# Patient Record
Sex: Female | Born: 1955 | Race: White | Hispanic: No | Smoking: Never smoker
Health system: Southern US, Community
[De-identification: ages and names within clinical notes are randomized; demographics above are authoritative.]

## PROBLEM LIST (undated history)

## (undated) DIAGNOSIS — R42 Dizziness and giddiness: Secondary | ICD-10-CM

## (undated) DIAGNOSIS — R001 Bradycardia, unspecified: Secondary | ICD-10-CM

## (undated) DIAGNOSIS — F329 Major depressive disorder, single episode, unspecified: Secondary | ICD-10-CM

## (undated) DIAGNOSIS — K829 Disease of gallbladder, unspecified: Secondary | ICD-10-CM

## (undated) DIAGNOSIS — R600 Localized edema: Secondary | ICD-10-CM

## (undated) DIAGNOSIS — F419 Anxiety disorder, unspecified: Secondary | ICD-10-CM

## (undated) DIAGNOSIS — R55 Syncope and collapse: Secondary | ICD-10-CM

## (undated) DIAGNOSIS — M549 Dorsalgia, unspecified: Secondary | ICD-10-CM

## (undated) DIAGNOSIS — R519 Headache, unspecified: Secondary | ICD-10-CM

## (undated) DIAGNOSIS — F32A Depression, unspecified: Secondary | ICD-10-CM

## (undated) DIAGNOSIS — G43019 Migraine without aura, intractable, without status migrainosus: Secondary | ICD-10-CM

## (undated) DIAGNOSIS — Z780 Asymptomatic menopausal state: Secondary | ICD-10-CM

## (undated) DIAGNOSIS — R51 Headache: Secondary | ICD-10-CM

## (undated) HISTORY — PX: BUNIONECTOMY: SHX129

## (undated) HISTORY — DX: Anxiety disorder, unspecified: F41.9

## (undated) HISTORY — DX: Major depressive disorder, single episode, unspecified: F32.9

## (undated) HISTORY — DX: Migraine without aura, intractable, without status migrainosus: G43.019

## (undated) HISTORY — DX: Dizziness and giddiness: R42

## (undated) HISTORY — PX: BACK SURGERY: SHX140

## (undated) HISTORY — DX: Headache, unspecified: R51.9

## (undated) HISTORY — DX: Localized edema: R60.0

## (undated) HISTORY — DX: Dorsalgia, unspecified: M54.9

## (undated) HISTORY — PX: BREAST SURGERY: SHX581

## (undated) HISTORY — DX: Depression, unspecified: F32.A

## (undated) HISTORY — DX: Disease of gallbladder, unspecified: K82.9

## (undated) HISTORY — DX: Asymptomatic menopausal state: Z78.0

## (undated) HISTORY — DX: Headache: R51

## (undated) HISTORY — DX: Bradycardia, unspecified: R00.1

## (undated) HISTORY — DX: Syncope and collapse: R55

## (undated) HISTORY — PX: KNEE ARTHROSCOPY: SUR90

## (undated) HISTORY — PX: CHOLECYSTECTOMY: SHX55

---

## 2005-09-10 ENCOUNTER — Encounter: Admission: RE | Admit: 2005-09-10 | Discharge: 2005-09-10 | Payer: Self-pay | Admitting: Internal Medicine

## 2005-10-22 ENCOUNTER — Other Ambulatory Visit: Admission: RE | Admit: 2005-10-22 | Discharge: 2005-10-22 | Payer: Self-pay | Admitting: Obstetrics & Gynecology

## 2013-07-21 ENCOUNTER — Ambulatory Visit (INDEPENDENT_AMBULATORY_CARE_PROVIDER_SITE_OTHER): Payer: BC Managed Care – PPO | Admitting: Psychology

## 2013-07-21 DIAGNOSIS — F331 Major depressive disorder, recurrent, moderate: Secondary | ICD-10-CM

## 2013-08-04 ENCOUNTER — Ambulatory Visit (INDEPENDENT_AMBULATORY_CARE_PROVIDER_SITE_OTHER): Payer: BC Managed Care – PPO | Admitting: Psychology

## 2013-08-04 DIAGNOSIS — F331 Major depressive disorder, recurrent, moderate: Secondary | ICD-10-CM

## 2013-08-10 ENCOUNTER — Encounter: Payer: Self-pay | Admitting: Internal Medicine

## 2013-08-10 ENCOUNTER — Ambulatory Visit (INDEPENDENT_AMBULATORY_CARE_PROVIDER_SITE_OTHER): Payer: BC Managed Care – PPO | Admitting: Internal Medicine

## 2013-08-10 ENCOUNTER — Other Ambulatory Visit: Payer: Self-pay | Admitting: Internal Medicine

## 2013-08-10 ENCOUNTER — Ambulatory Visit: Payer: BC Managed Care – PPO | Admitting: Psychology

## 2013-08-10 VITALS — BP 96/57 | HR 55 | Temp 98.0°F | Resp 18 | Ht 65.5 in | Wt 177.0 lb

## 2013-08-10 DIAGNOSIS — N951 Menopausal and female climacteric states: Secondary | ICD-10-CM

## 2013-08-10 DIAGNOSIS — K8020 Calculus of gallbladder without cholecystitis without obstruction: Secondary | ICD-10-CM

## 2013-08-10 DIAGNOSIS — K219 Gastro-esophageal reflux disease without esophagitis: Secondary | ICD-10-CM | POA: Insufficient documentation

## 2013-08-10 DIAGNOSIS — F3289 Other specified depressive episodes: Secondary | ICD-10-CM

## 2013-08-10 DIAGNOSIS — F419 Anxiety disorder, unspecified: Secondary | ICD-10-CM | POA: Insufficient documentation

## 2013-08-10 DIAGNOSIS — Z780 Asymptomatic menopausal state: Secondary | ICD-10-CM | POA: Insufficient documentation

## 2013-08-10 DIAGNOSIS — F32A Depression, unspecified: Secondary | ICD-10-CM | POA: Insufficient documentation

## 2013-08-10 DIAGNOSIS — K279 Peptic ulcer, site unspecified, unspecified as acute or chronic, without hemorrhage or perforation: Secondary | ICD-10-CM | POA: Insufficient documentation

## 2013-08-10 DIAGNOSIS — F329 Major depressive disorder, single episode, unspecified: Secondary | ICD-10-CM

## 2013-08-10 DIAGNOSIS — F411 Generalized anxiety disorder: Secondary | ICD-10-CM

## 2013-08-10 DIAGNOSIS — K859 Acute pancreatitis without necrosis or infection, unspecified: Secondary | ICD-10-CM

## 2013-08-10 LAB — CBC WITH DIFFERENTIAL/PLATELET
Basophils Absolute: 0 10*3/uL (ref 0.0–0.1)
Basophils Relative: 0 % (ref 0–1)
Eosinophils Absolute: 0.1 10*3/uL (ref 0.0–0.7)
Eosinophils Relative: 1 % (ref 0–5)
HCT: 39.6 % (ref 36.0–46.0)
Hemoglobin: 13.1 g/dL (ref 12.0–15.0)
Lymphocytes Relative: 41 % (ref 12–46)
Lymphs Abs: 2.5 10*3/uL (ref 0.7–4.0)
MCH: 27.1 pg (ref 26.0–34.0)
MCHC: 33.1 g/dL (ref 30.0–36.0)
MCV: 82 fL (ref 78.0–100.0)
Monocytes Absolute: 0.5 10*3/uL (ref 0.1–1.0)
Monocytes Relative: 8 % (ref 3–12)
Neutro Abs: 3.1 10*3/uL (ref 1.7–7.7)
Neutrophils Relative %: 50 % (ref 43–77)
Platelets: 221 10*3/uL (ref 150–400)
RBC: 4.83 MIL/uL (ref 3.87–5.11)
RDW: 14.8 % (ref 11.5–15.5)
WBC: 6.2 10*3/uL (ref 4.0–10.5)

## 2013-08-10 LAB — LIPID PANEL
Cholesterol: 167 mg/dL (ref 0–200)
HDL: 53 mg/dL (ref >39)
LDL Cholesterol: 98 mg/dL (ref 0–99)
Total CHOL/HDL Ratio: 3.2 Ratio
Triglycerides: 79 mg/dL (ref <150)
VLDL: 16 mg/dL (ref 0–40)

## 2013-08-10 NOTE — Patient Instructions (Signed)
Schedule CPE  To lab today  Sign old records

## 2013-08-10 NOTE — Progress Notes (Signed)
Subjective:    Patient ID: Sharon Cole, female    DOB: 07-11-1955, 58 y.o.   MRN: [id number]  HPI  New pt here for first visit to establish primary care.  Sharon Cole has been in [place] for 2 months, formerly from [facility].     She is the daughter of Sharon Cole a pt. Of mine  PMH of recurrent biliary stones, anxiety, depression, gerd,  PUD (15 years ago) and menopause.  Recurrent biliary stones: Pt reports she had cholecystectoy in 1998 and did well for several years.   She was found to have a biliary stone about 2 years ago  (told it was a pigment stone) and tells me she has had 8 ERCP's in the past several years.  Not clear if a definitive stone was found each time  ( I do not have old recrods)   She has been evaluated by GI MD  Dr. Gayleen Orem [facility] and will be seeing a biliary surgeon Dr. Cloyd Stagers at [facility] very soon.   She does not report any pain now.   She tells me she was told she did have slight increase in her liver blood tests that were perfomed at [facility]  ( I do not have results)  Anxiety/Depression  Well controlled on Lexapro  She does have a skin lesion on back  That itches and has gotten bigger in size  Strong FH of CAD brother, mother grandparents    Pt reports she had a cardiac cath 6 years ago and was told it was "clean"  GERD remote PUD well controlled with omeprazole    No Known Allergies Past Medical History  Diagnosis Date  . Anxiety   . Depression   . Menopause    Past Surgical History  Procedure Laterality Date  . Breast surgery      breast reduction  . Cholecystectomy    . Knee arthroscopy     History   Social History  . Marital Status: Unknown    Spouse Name: N/A    Number of Children: N/A  . Years of Education: N/A   Occupational History  . Not on file.   Social History Main Topics  . Smoking status: Never Smoker   . Smokeless tobacco: Never Used  . Alcohol Use: 0.0 oz/week    0-2 Glasses of wine per week     Comment: daily  . Drug  Use: No  . Sexual Activity: Yes   Other Topics Concern  . Not on file   Social History Narrative  . No narrative on file   Family History  Problem Relation Age of Onset  . Heart disease Mother   . Heart disease Brother   . Heart disease Maternal Grandmother    There are no active problems to display for this patient.  No current outpatient prescriptions on file prior to visit.   No current facility-administered medications on file prior to visit.      Review of Systems See HPI    Objective:   Physical Exam Physical Exam  Nursing note and vitals reviewed.  Constitutional: She is oriented to person, place, and time. She appears well-developed and well-nourished.  HENT:  Head: Normocephalic and atraumatic.  Cardiovascular: Normal rate and regular rhythm. Exam reveals no gallop and no friction rub.  No murmur heard.  Pulmonary/Chest: Breath sounds normal. She has no wheezes. She has no rales.  Neurological: She is alert and oriented to person, place, and time.  Skin: Skin is  warm and dry.  Psychiatric: She has a normal mood and affect. Her behavior is normal.          Assessment & Plan:  Recurrent biliary pigment stones.   Will have pt sign for records.  [facility] eval pending. Pt pain free now. Check labs today  Elevated lfts'   Per pt report     Anxiety/Depression refill Lexapro  GERD/remote PUD  contineu omeprazole  Strong FH  CAD   Menopause    Schedule CPE  Labs today

## 2013-08-11 ENCOUNTER — Telehealth: Payer: Self-pay | Admitting: *Deleted

## 2013-08-11 LAB — COMPREHENSIVE METABOLIC PANEL
ALT: 100 U/L — ABNORMAL HIGH (ref 0–35)
AST: 29 U/L (ref 0–37)
Albumin: 4 g/dL (ref 3.5–5.2)
Alkaline Phosphatase: 183 U/L — ABNORMAL HIGH (ref 39–117)
BUN: 22 mg/dL (ref 6–23)
CO2: 28 mEq/L (ref 19–32)
Calcium: 9.3 mg/dL (ref 8.4–10.5)
Chloride: 103 mEq/L (ref 96–112)
Creat: 0.64 mg/dL (ref 0.50–1.10)
Glucose, Bld: 82 mg/dL (ref 70–99)
Potassium: 4.2 mEq/L (ref 3.5–5.3)
Sodium: 140 mEq/L (ref 135–145)
Total Bilirubin: 0.5 mg/dL (ref 0.2–1.2)
Total Protein: 6.2 g/dL (ref 6.0–8.3)

## 2013-08-11 LAB — VITAMIN D 25 HYDROXY (VIT D DEFICIENCY, FRACTURES): Vit D, 25-Hydroxy: 61 ng/mL (ref 30–89)

## 2013-08-11 LAB — TSH: TSH: 2.986 u[IU]/mL (ref 0.350–4.500)

## 2013-08-11 NOTE — Telephone Encounter (Signed)
CMP added to recent labs

## 2013-08-18 ENCOUNTER — Telehealth: Payer: Self-pay | Admitting: Internal Medicine

## 2013-08-18 ENCOUNTER — Ambulatory Visit: Payer: BC Managed Care – PPO | Admitting: Psychology

## 2013-08-19 ENCOUNTER — Ambulatory Visit (INDEPENDENT_AMBULATORY_CARE_PROVIDER_SITE_OTHER): Payer: BC Managed Care – PPO | Admitting: Psychology

## 2013-08-19 DIAGNOSIS — F331 Major depressive disorder, recurrent, moderate: Secondary | ICD-10-CM

## 2013-08-23 ENCOUNTER — Ambulatory Visit (INDEPENDENT_AMBULATORY_CARE_PROVIDER_SITE_OTHER): Payer: BC Managed Care – PPO | Admitting: Psychology

## 2013-08-23 DIAGNOSIS — F331 Major depressive disorder, recurrent, moderate: Secondary | ICD-10-CM

## 2013-08-30 ENCOUNTER — Ambulatory Visit: Payer: BC Managed Care – PPO | Admitting: Psychology

## 2013-09-06 ENCOUNTER — Ambulatory Visit: Payer: BC Managed Care – PPO | Admitting: Psychology

## 2013-09-06 ENCOUNTER — Encounter: Payer: Self-pay | Admitting: Internal Medicine

## 2013-09-10 ENCOUNTER — Ambulatory Visit: Payer: BC Managed Care – PPO | Admitting: Psychology

## 2013-09-13 ENCOUNTER — Ambulatory Visit (INDEPENDENT_AMBULATORY_CARE_PROVIDER_SITE_OTHER): Payer: BC Managed Care – PPO | Admitting: Psychology

## 2013-09-13 DIAGNOSIS — F331 Major depressive disorder, recurrent, moderate: Secondary | ICD-10-CM

## 2013-09-17 ENCOUNTER — Encounter: Payer: Self-pay | Admitting: *Deleted

## 2013-09-20 ENCOUNTER — Ambulatory Visit (INDEPENDENT_AMBULATORY_CARE_PROVIDER_SITE_OTHER): Payer: BC Managed Care – PPO | Admitting: Psychology

## 2013-09-20 DIAGNOSIS — F331 Major depressive disorder, recurrent, moderate: Secondary | ICD-10-CM

## 2013-09-27 ENCOUNTER — Encounter: Payer: Self-pay | Admitting: Internal Medicine

## 2013-09-27 DIAGNOSIS — K851 Biliary acute pancreatitis without necrosis or infection: Secondary | ICD-10-CM | POA: Insufficient documentation

## 2013-09-27 DIAGNOSIS — Z9049 Acquired absence of other specified parts of digestive tract: Secondary | ICD-10-CM | POA: Insufficient documentation

## 2013-09-27 DIAGNOSIS — K279 Peptic ulcer, site unspecified, unspecified as acute or chronic, without hemorrhage or perforation: Secondary | ICD-10-CM | POA: Insufficient documentation

## 2013-10-01 ENCOUNTER — Ambulatory Visit: Payer: BC Managed Care – PPO | Admitting: Psychology

## 2013-10-04 ENCOUNTER — Ambulatory Visit (INDEPENDENT_AMBULATORY_CARE_PROVIDER_SITE_OTHER): Payer: BC Managed Care – PPO | Admitting: Psychology

## 2013-10-04 DIAGNOSIS — F331 Major depressive disorder, recurrent, moderate: Secondary | ICD-10-CM

## 2013-10-11 ENCOUNTER — Telehealth: Payer: Self-pay | Admitting: *Deleted

## 2013-10-11 NOTE — Telephone Encounter (Signed)
Refill request pt states that this is 40 mg BID

## 2013-10-11 NOTE — Telephone Encounter (Signed)
Needs 40 mg bid Omeprazole called into [facility] [place]  She is out.  She said she requested it at last office visit

## 2013-10-12 MED ORDER — OMEPRAZOLE 40 MG PO CPDR: 20.0000 mg | DELAYED_RELEASE_CAPSULE | Freq: Two times a day (BID) | ORAL | Status: DC

## 2013-10-14 ENCOUNTER — Ambulatory Visit (INDEPENDENT_AMBULATORY_CARE_PROVIDER_SITE_OTHER): Payer: BC Managed Care – PPO | Admitting: Psychology

## 2013-10-14 DIAGNOSIS — F331 Major depressive disorder, recurrent, moderate: Secondary | ICD-10-CM

## 2013-10-21 ENCOUNTER — Ambulatory Visit (INDEPENDENT_AMBULATORY_CARE_PROVIDER_SITE_OTHER): Payer: BC Managed Care – PPO | Admitting: Psychology

## 2013-10-21 ENCOUNTER — Encounter: Payer: Self-pay | Admitting: *Deleted

## 2013-10-21 DIAGNOSIS — F331 Major depressive disorder, recurrent, moderate: Secondary | ICD-10-CM

## 2013-10-28 ENCOUNTER — Ambulatory Visit: Payer: BC Managed Care – PPO | Admitting: Psychology

## 2013-11-04 ENCOUNTER — Ambulatory Visit (INDEPENDENT_AMBULATORY_CARE_PROVIDER_SITE_OTHER): Payer: BC Managed Care – PPO | Admitting: Psychology

## 2013-11-04 DIAGNOSIS — F331 Major depressive disorder, recurrent, moderate: Secondary | ICD-10-CM

## 2013-11-10 ENCOUNTER — Ambulatory Visit (INDEPENDENT_AMBULATORY_CARE_PROVIDER_SITE_OTHER): Payer: BC Managed Care – PPO | Admitting: Psychology

## 2013-11-10 DIAGNOSIS — F331 Major depressive disorder, recurrent, moderate: Secondary | ICD-10-CM

## 2013-11-11 ENCOUNTER — Ambulatory Visit: Payer: BC Managed Care – PPO | Admitting: Psychology

## 2013-11-18 ENCOUNTER — Ambulatory Visit (INDEPENDENT_AMBULATORY_CARE_PROVIDER_SITE_OTHER): Payer: BC Managed Care – PPO | Admitting: Psychology

## 2013-11-18 DIAGNOSIS — F331 Major depressive disorder, recurrent, moderate: Secondary | ICD-10-CM

## 2013-11-24 ENCOUNTER — Telehealth: Payer: Self-pay | Admitting: *Deleted

## 2013-11-24 NOTE — Telephone Encounter (Signed)
Forwarding to Dr.

## 2013-11-24 NOTE — Telephone Encounter (Signed)
Sharon Cole would like a refill on her Lorazepam 0.5 mg BID as needed.  [facility] [place]

## 2013-11-24 NOTE — Telephone Encounter (Signed)
Pt called in requesting refill on 0.5 LORAZEPAM.Next upcoming appt w/ you is 8/19.

## 2013-11-25 ENCOUNTER — Ambulatory Visit (INDEPENDENT_AMBULATORY_CARE_PROVIDER_SITE_OTHER): Payer: BC Managed Care – PPO | Admitting: Psychology

## 2013-11-25 DIAGNOSIS — F331 Major depressive disorder, recurrent, moderate: Secondary | ICD-10-CM

## 2013-11-30 ENCOUNTER — Telehealth: Payer: Self-pay | Admitting: *Deleted

## 2013-11-30 ENCOUNTER — Other Ambulatory Visit: Payer: Self-pay | Admitting: *Deleted

## 2013-11-30 NOTE — Telephone Encounter (Signed)
Resent request to Dr.

## 2013-11-30 NOTE — Telephone Encounter (Signed)
Sharon Cole called back. She has not heard anything about her RX request for Lorazepam.  Can you check with Dr Coralyn Mark?

## 2013-11-30 NOTE — Telephone Encounter (Signed)
Pt called in request for refill

## 2013-12-01 MED ORDER — LORAZEPAM 0.5 MG PO TABS
0.5000 mg | ORAL_TABLET | Freq: Three times a day (TID) | ORAL | Status: DC
Start: ? — End: 2014-01-17

## 2013-12-01 NOTE — Telephone Encounter (Signed)
Verbal called in

## 2013-12-09 ENCOUNTER — Ambulatory Visit: Payer: BC Managed Care – PPO | Admitting: Psychology

## 2013-12-16 ENCOUNTER — Ambulatory Visit (INDEPENDENT_AMBULATORY_CARE_PROVIDER_SITE_OTHER): Payer: BC Managed Care – PPO | Admitting: Psychology

## 2013-12-16 DIAGNOSIS — F331 Major depressive disorder, recurrent, moderate: Secondary | ICD-10-CM

## 2013-12-22 ENCOUNTER — Encounter: Payer: Self-pay | Admitting: Internal Medicine

## 2013-12-22 ENCOUNTER — Ambulatory Visit (INDEPENDENT_AMBULATORY_CARE_PROVIDER_SITE_OTHER): Payer: BC Managed Care – PPO | Admitting: Internal Medicine

## 2013-12-22 VITALS — BP 93/62 | HR 51 | Temp 97.1°F | Resp 16 | Ht 65.0 in | Wt 173.0 lb

## 2013-12-22 DIAGNOSIS — Z124 Encounter for screening for malignant neoplasm of cervix: Secondary | ICD-10-CM

## 2013-12-22 DIAGNOSIS — R109 Unspecified abdominal pain: Secondary | ICD-10-CM

## 2013-12-22 DIAGNOSIS — Z0000 Encounter for general adult medical examination without abnormal findings: Secondary | ICD-10-CM

## 2013-12-22 DIAGNOSIS — Z1151 Encounter for screening for human papillomavirus (HPV): Secondary | ICD-10-CM

## 2013-12-22 LAB — HEMOCCULT GUIAC POC 1CARD (OFFICE)
Card #2 Fecal Occult Blod, POC: NEGATIVE
Fecal Occult Blood, POC: NEGATIVE

## 2013-12-22 LAB — CBC WITH DIFFERENTIAL/PLATELET
Basophils Absolute: 0 10*3/uL (ref 0.0–0.1)
Basophils Relative: 0 % (ref 0–1)
Eosinophils Absolute: 0.1 10*3/uL (ref 0.0–0.7)
Eosinophils Relative: 1 % (ref 0–5)
HCT: 41.3 % (ref 36.0–46.0)
Hemoglobin: 13.7 g/dL (ref 12.0–15.0)
Lymphocytes Relative: 39 % (ref 12–46)
Lymphs Abs: 2.1 10*3/uL (ref 0.7–4.0)
MCH: 27.4 pg (ref 26.0–34.0)
MCHC: 33.2 g/dL (ref 30.0–36.0)
MCV: 82.6 fL (ref 78.0–100.0)
Monocytes Absolute: 0.4 10*3/uL (ref 0.1–1.0)
Monocytes Relative: 7 % (ref 3–12)
Neutro Abs: 2.8 10*3/uL (ref 1.7–7.7)
Neutrophils Relative %: 53 % (ref 43–77)
Platelets: 169 10*3/uL (ref 150–400)
RBC: 5 MIL/uL (ref 3.87–5.11)
RDW: 14.7 % (ref 11.5–15.5)
WBC: 5.3 10*3/uL (ref 4.0–10.5)

## 2013-12-22 LAB — COMPREHENSIVE METABOLIC PANEL
ALT: 20 U/L (ref 0–35)
AST: 21 U/L (ref 0–37)
Albumin: 4.1 g/dL (ref 3.5–5.2)
Alkaline Phosphatase: 90 U/L (ref 39–117)
BUN: 17 mg/dL (ref 6–23)
CO2: 21 mEq/L (ref 19–32)
Calcium: 8.6 mg/dL (ref 8.4–10.5)
Chloride: 102 mEq/L (ref 96–112)
Creat: 0.6 mg/dL (ref 0.50–1.10)
Glucose, Bld: 87 mg/dL (ref 70–99)
Potassium: 4.2 mEq/L (ref 3.5–5.3)
Sodium: 139 mEq/L (ref 135–145)
Total Bilirubin: 0.6 mg/dL (ref 0.2–1.2)
Total Protein: 6.3 g/dL (ref 6.0–8.3)

## 2013-12-22 LAB — POCT URINALYSIS DIPSTICK
Bilirubin, UA: NEGATIVE
Blood, UA: NEGATIVE
Glucose, UA: NEGATIVE
Ketones, UA: NEGATIVE
Leukocytes, UA: NEGATIVE
Nitrite, UA: NEGATIVE
Protein, UA: NEGATIVE
Spec Grav, UA: 1.01
Urobilinogen, UA: 0.2
pH, UA: 7

## 2013-12-22 LAB — LIPASE: Lipase: 18 U/L (ref 0–75)

## 2013-12-22 MED ORDER — NYSTATIN-TRIAMCINOLONE 100000-0.1 UNIT/GM-% EX OINT: 1.0000 "application " | TOPICAL_OINTMENT | Freq: Two times a day (BID) | CUTANEOUS | Status: DC

## 2013-12-22 MED ORDER — PROMETHAZINE HCL 12.5 MG PO TABS: 12.5000 mg | ORAL_TABLET | Freq: Three times a day (TID) | ORAL | Status: DC | PRN

## 2013-12-22 MED ORDER — MECLIZINE HCL 25 MG PO TABS: ORAL_TABLET | ORAL | Status: DC

## 2013-12-22 MED ORDER — OMEPRAZOLE 40 MG PO CPDR: 40.0000 mg | DELAYED_RELEASE_CAPSULE | Freq: Two times a day (BID) | ORAL | Status: DC

## 2013-12-22 MED ORDER — ESCITALOPRAM OXALATE 20 MG PO TABS: ORAL_TABLET | ORAL | Status: DC

## 2013-12-22 NOTE — Patient Instructions (Signed)
Give pt number to Dr. Delman Cheadle / Dr. Renda Rolls for pt to make appt  See me in 2-3 weeks  30 min   Set up 3D mm at breast center

## 2013-12-22 NOTE — Progress Notes (Signed)
Subjective:    Patient ID: Sharon Cole, female    DOB: 04-19-1956, 58 y.o.   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  Colonoscopy done 2013  In [place].  No prolyps  per her report  She has fungal infection on toes and Great toe R foot had injury and she is losing nail.  No surrounding redness.   She also reports dizziness that has felt similar to vertigo that she has had in the past.  She reports past histoyr of nausea and dizziness when living in [place].  Treated with antivert. ,  Zofran   Could not tolerate zofran  She also reports dull generalized abd pain that began 2 days ago with nausea.  Some diarrhea no fever.   No dysuria, no vomiting no vaginal discharge  She has a new boyfriend and is now living with her boyfriend.  Recurrent biliary stones  She had excision at [facility] with roux-en-Y procedure she states she is doing quite well   No Known Allergies Past Medical History  Diagnosis Date  . Anxiety   . Depression   . Menopause    Past Surgical History  Procedure Laterality Date  . Breast surgery      breast reduction  . Cholecystectomy    . Knee arthroscopy     History   Social History  . Marital Status: Unknown    Spouse Name: N/A    Number of Children: N/A  . Years of Education: N/A   Occupational History  . Not on file.   Social History Main Topics  . Smoking status: Never Smoker   . Smokeless tobacco: Never Used  . Alcohol Use: 0.0 oz/week    0-2 Glasses of wine per week     Comment: daily  . Drug Use: No  . Sexual Activity: Yes   Other Topics Concern  . Not on file   Social History Narrative  . No narrative on file   Family History  Problem Relation Age of Onset  . Heart disease Mother   . Heart disease Brother   . Heart disease Maternal Grandmother    Patient Active Problem List   Diagnosis Date Noted  . Gallstone  pancreatitis 09/27/2013  . PUD (peptic ulcer disease)  duodenal  2011 09/27/2013  . S/P cholecystectomy 09/27/2013  . Anxiety 08/10/2013  .  Biliary calculi recurrent S/p Roux-en-Y [facility] 08/10/2013  . Depression 08/10/2013  . GERD (gastroesophageal reflux disease) 08/10/2013  . Pancreatitis 08/10/2013  . Menopause 08/10/2013  . Peptic ulcer disease  remote 08/10/2013   Current Outpatient Prescriptions on File Prior to Visit  Medication Sig Dispense Refill  . escitalopram (LEXAPRO) 20 MG tablet Take 20 mg by mouth daily.      Marland Kitchen levofloxacin (LEVAQUIN) 500 MG tablet Take 500 mg by mouth daily.      Marland Kitchen LORazepam (ATIVAN) 0.5 MG tablet Take 1 tablet (0.5 mg total) by mouth every 8 (eight) hours.  30 tablet  2  . omeprazole (PRILOSEC) 40 MG capsule Take 1 capsule (40 mg total) by mouth 2 (two) times daily before a meal.  180 capsule  0   No current facility-administered medications on file prior to visit.      Review of Systems  Respiratory: Negative for cough, chest tightness, shortness of breath and wheezing.   Cardiovascular:  Negative for chest pain, palpitations and leg swelling.  Gastrointestinal: Negative for abdominal pain.       Objective:   Physical Exam  Physical Exam  Vital signs and nursing note reviewed  Constitutional: She is oriented to person, place, and time. She appears well-developed and well-nourished. She is cooperative.  HENT:  Head: Normocephalic and atraumatic.  Right Ear: Tympanic membrane normal.  Left Ear: Tympanic membrane normal.  Nose: Nose normal.  Mouth/Throat: Oropharynx is clear and moist and mucous membranes are normal. No oropharyngeal exudate or posterior oropharyngeal erythema.  Eyes: Conjunctivae and EOM are normal. Pupils are equal, round, and reactive to light.  Neck: Neck supple. No JVD present. Carotid bruit is not present. No mass and no thyromegaly present.  Cardiovascular: Regular rhythm, normal heart sounds, intact distal pulses  and normal pulses.  Exam reveals no gallop and no friction rub.   No murmur heard. Pulses:      Dorsalis pedis pulses are 2+ on the right side, and 2+ on the left side.  Pulmonary/Chest: Breath sounds normal. She has no wheezes. She has no rhonchi. She has no rales. Right breast exhibits no mass, no nipple discharge and no skin change. Left breast exhibits no mass, no nipple discharge and no skin change.  Abdominal: Soft. Bowel sounds are normal. She exhibits no distension and no mass. There is no hepatosplenomegaly. There is no tenderness. There is no CVA tenderness.  Genitourinary: Rectum normal, vagina normal and uterus normal. Rectal exam shows no mass. Guaiac negative stool. No labial fusion. There is no lesion on the right labia. There is no lesion on the left labia. Cervix exhibits no motion tenderness. Right adnexum displays no mass, no tenderness and no fullness. Left adnexum displays no mass, no tenderness and no fullness. No erythema around the vagina.  Musculoskeletal:       No active synovitis to any joint.    Lymphadenopathy:       Right cervical: No superficial cervical adenopathy present.      Left cervical: No superficial cervical adenopathy present.       Right axillary: No pectoral and no lateral adenopathy present.       Left axillary: No pectoral and no lateral adenopathy present.      Right: No inguinal adenopathy present.       Left: No inguinal adenopathy present.  Neurological:   Non-focal  She is alert and oriented to person, place, and time. She has normal strength and normal reflexes. No cranial nerve deficit or sensory deficit. She displays a negative Romberg sign. Coordination and gait normal.  Skin: Skin is warm and dry. No abrasion, no bruising, no ecchymosis and no rash noted. No cyanosis. Nails show no clubbing.  She has polish on toes but does have some thickening.  Left Great toe is loosining  No surrounding cellulitis She has flat brown spot onher back   Not  clear o fonset She does have scaly red rash moccasin distribution  Psychiatric: She has a normal mood and affect. Her speech is normal and behavior is normal.          Assessment & Plan:   HM:    Pap today will schedule 3D mm  See scanned sheet.   Counseled screening lung cancer guideline  Pt does not meet criteria  Non-smoker  Dizziness: See EKG  She has sinus bradycardia    May represent vertigo and will try antivert 25 mg if not better will need further eval.  Dull abd pain  With nausea  Will get labs today see me in 2-3 weeks if persistant will consider imaging.  She had some diarrhea and this may be low level GE but will follow expectantly   Recurrent biliary stones  Will get labs today  Depression  Continue Lexapro  Severe GERD  She is on high dose prilosec bid and that is the only thing that controls her.    Ok to continue   Elevated alk phos  See above.    Tinea foot/onychomycosis  mycolog bid   Will give number to Derm for pt to call  See me in 3-4 weeks        Assessment & Plan:

## 2013-12-23 ENCOUNTER — Other Ambulatory Visit: Payer: Self-pay | Admitting: Internal Medicine

## 2013-12-23 ENCOUNTER — Ambulatory Visit: Payer: BC Managed Care – PPO | Admitting: Psychology

## 2013-12-23 DIAGNOSIS — Z1231 Encounter for screening mammogram for malignant neoplasm of breast: Secondary | ICD-10-CM

## 2013-12-23 LAB — CYTOLOGY - PAP

## 2013-12-23 LAB — VITAMIN D 25 HYDROXY (VIT D DEFICIENCY, FRACTURES): Vit D, 25-Hydroxy: 57 ng/mL (ref 30–89)

## 2013-12-27 ENCOUNTER — Telehealth: Payer: Self-pay | Admitting: *Deleted

## 2013-12-27 ENCOUNTER — Encounter: Payer: Self-pay | Admitting: *Deleted

## 2013-12-27 NOTE — Telephone Encounter (Signed)
Sharon Cole said that she needs to see a cardiologist, and she would like to see Dr Gwenlyn Found. He is not taking new patients until October; she wants to know if we are able to get her in sooner.

## 2013-12-29 NOTE — Telephone Encounter (Signed)
Call pt and check on her dizziness.   Is it worse?    Why does she need a cardiologist?  If she is having symptoms or dizziness worse she needs to see me in am  Route back response to me

## 2013-12-29 NOTE — Telephone Encounter (Signed)
I spoke to pt about the Cardio referral. Pt states Dr. Coralyn Mark wanted her to be seen by cardiologist due to bradycardia. I explained to pt that I didn't see that referral in the chart but would get with Dr Coralyn Mark to follow up on that. Pt states dizziness has "almost resolved" but had fainted a couple of days ago after taking Meclizine and Phenergan together. Pt states she thinks the fainting spell is related to the medication she took and not so much related to bradycardia. However, pt would still like to be referred to Dr. Gwenlyn Found.

## 2013-12-30 ENCOUNTER — Ambulatory Visit (INDEPENDENT_AMBULATORY_CARE_PROVIDER_SITE_OTHER): Payer: BC Managed Care – PPO | Admitting: Psychology

## 2013-12-30 ENCOUNTER — Telehealth: Payer: Self-pay | Admitting: Internal Medicine

## 2013-12-30 ENCOUNTER — Telehealth: Payer: Self-pay | Admitting: Cardiovascular Disease

## 2013-12-30 DIAGNOSIS — F331 Major depressive disorder, recurrent, moderate: Secondary | ICD-10-CM

## 2013-12-30 NOTE — Telephone Encounter (Signed)
Left message on voicemail to call office as I wish to speak to pt regarding her fainting.

## 2013-12-30 NOTE — Telephone Encounter (Signed)
Dr Coralyn Mark  Called She would like this patient to be seen as a new patient for Dr Roderic Palau BERRY SX bradycardia rate 10 Family history of cardiac issues One episode of syncope  Per Dr Coralyn Mark ,not urgent. RN spoke to [employer] (Dr Kennon Holter nurse). Appointment 01/14/2014 at 7:45 am  Will have schedule contact patient -give date and send new patient packet

## 2014-01-04 ENCOUNTER — Telehealth: Payer: Self-pay | Admitting: Cardiovascular Disease

## 2014-01-06 ENCOUNTER — Ambulatory Visit (INDEPENDENT_AMBULATORY_CARE_PROVIDER_SITE_OTHER): Payer: BC Managed Care – PPO | Admitting: Psychology

## 2014-01-06 DIAGNOSIS — F331 Major depressive disorder, recurrent, moderate: Secondary | ICD-10-CM

## 2014-01-06 NOTE — Telephone Encounter (Signed)
Closed encounter °

## 2014-01-12 ENCOUNTER — Ambulatory Visit: Payer: BC Managed Care – PPO | Admitting: Internal Medicine

## 2014-01-13 ENCOUNTER — Ambulatory Visit (INDEPENDENT_AMBULATORY_CARE_PROVIDER_SITE_OTHER): Payer: BC Managed Care – PPO | Admitting: Psychology

## 2014-01-13 DIAGNOSIS — F331 Major depressive disorder, recurrent, moderate: Secondary | ICD-10-CM

## 2014-01-14 ENCOUNTER — Ambulatory Visit (INDEPENDENT_AMBULATORY_CARE_PROVIDER_SITE_OTHER): Payer: BC Managed Care – PPO | Admitting: Cardiovascular Disease

## 2014-01-14 ENCOUNTER — Encounter: Payer: Self-pay | Admitting: Cardiovascular Disease

## 2014-01-14 VITALS — BP 92/70 | HR 52 | Ht 65.5 in | Wt 176.4 lb

## 2014-01-14 DIAGNOSIS — R55 Syncope and collapse: Secondary | ICD-10-CM

## 2014-01-14 DIAGNOSIS — R001 Bradycardia, unspecified: Secondary | ICD-10-CM

## 2014-01-14 DIAGNOSIS — I498 Other specified cardiac arrhythmias: Secondary | ICD-10-CM

## 2014-01-14 NOTE — Assessment & Plan Note (Signed)
Patient has asymptomatic bradycardia with heart rates in the 40-50 range. She is on no rate lowering medications. She is also relatively hypotensive with blood pressures in the 90/60 range. She has had episodes of vertigo recently with a episode of syncope of unclear etiology. I'm going to get a 30 day event monitor to further evaluate.

## 2014-01-14 NOTE — Progress Notes (Signed)
01/14/2014 BRITTON PERKINSON   Mar 19, 1956  [id number]  Primary Physician Kelton Pillar, MD Primary Cardiologist: Lorretta Harp MD Renae Gloss   HPI:  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 She was referred to me by Dr. Coralyn Mark for evaluation of bradycardia and syncope. Her cardiac risk factor for profile is notable for family history with a mother who had CAD and a brother who had a past surgery age 61. She does not smoke nor she diabetic, hypertensive or hyperlipidemic. She's never had a heart attack or stroke and denies chest pain or shortness of breath. She had a episode of syncope 2 weeks ago in the evening prior to going to bed. She also apparently was not taking her Ativan for several days and was experiencing vertigo. There was no loss of bowel or bladder control.   Current Outpatient Prescriptions  Medication Sig Dispense Refill  . escitalopram (LEXAPRO) 20 MG tablet Take one tablet bid  180 tablet  1  . LORazepam (ATIVAN) 0.5 MG tablet Take 1 tablet (0.5 mg total) by mouth every 8 (eight) hours.  30 tablet  2  . meclizine (ANTIVERT) 25 MG tablet Take one tablet bid prn dizziness  30 tablet  0  . nystatin-triamcinolone ointment (MYCOLOG) Apply 1 application topically 2 (two) times daily.  30 g  0  . omeprazole (PRILOSEC) 40 MG capsule Take 1 capsule (40 mg total) by mouth 2 (two) times daily before a meal.  180 capsule  0  . promethazine (PHENERGAN) 12.5 MG tablet Take 1 tablet (12.5 mg total) by mouth every 8 (eight) hours as needed for nausea or vomiting.  20 tablet  0   No current facility-administered medications for this visit.    Allergies  Allergen Reactions  . Zofran [Ondansetron Hcl]     intolerance    History   Social History  . Marital Status: Unknown    Spouse Name: N/A    Number of Children: N/A  . Years of  Education: N/A   Occupational History  . Not on file.   Social History Main Topics  . Smoking status: Never Smoker   . Smokeless tobacco: Never Used  . Alcohol Use: 0.0 oz/week    0-2 Glasses of wine per week     Comment: daily  . Drug Use: No  . Sexual Activity: Yes   Other Topics Concern  . Not on file   Social History Narrative  . No narrative on file     Review of Systems: General: negative for chills, fever, night sweats or weight changes.  Cardiovascular: negative for chest pain, dyspnea on exertion, edema, orthopnea, palpitations, paroxysmal nocturnal dyspnea or shortness of breath Dermatological: negative for rash Respiratory: negative for cough or wheezing Urologic: negative for hematuria Abdominal: negative for nausea, vomiting, diarrhea, bright red blood per rectum, melena, or hematemesis Neurologic: negative for visual changes, syncope, or dizziness All other systems reviewed and are otherwise negative except as noted above.    Blood pressure 92/70, pulse 52, height 5' 5.5" (1.664 m), weight 176 lb 6.4 oz (80.015 kg).  General appearance: alert and no distress Neck: no adenopathy, no carotid bruit, no JVD, supple, symmetrical, trachea midline and thyroid not enlarged, symmetric, no tenderness/mass/nodules Lungs: clear to auscultation bilaterally Heart: regular rate and rhythm, S1, S2 normal, no murmur, click, rub or gallop Extremities: extremities normal, atraumatic, no cyanosis or edema and  1+ pedal pulses bilaterally  EKG not performed today. Recent 2-D echo performed 12/23/11 revealed sinus bradycardia at 47 with low voltage.  ASSESSMENT AND PLAN:   Bradycardia Patient has asymptomatic bradycardia with heart rates in the 40-50 range. She is on no rate lowering medications. She is also relatively hypotensive with blood pressures in the 90/60 range. She has had episodes of vertigo recently with a episode of syncope of unclear etiology. I'm going to get a 30  day event monitor to further evaluate.  Syncope Episode occurred approximately 2-3 weeks ago. It was in the setting of vertigo. She did not lose bowel or bladder control. She has a history of a normal cardiac catheterization 5 years ago. I'm going to get a one-month event monitor.      Lorretta Harp MD FACP,FACC,FAHA, [facility] 01/14/2014 8:25 AM

## 2014-01-14 NOTE — Assessment & Plan Note (Signed)
Episode occurred approximately 2-3 weeks ago. It was in the setting of vertigo. She did not lose bowel or bladder control. She has a history of a normal cardiac catheterization 5 years ago. I'm going to get a one-month event monitor.

## 2014-01-14 NOTE — Patient Instructions (Signed)
  We will see you back in follow up only as needed.   Dr Gwenlyn Found has ordered:   Event monitor (30 days). Event monitors are medical devices that record the heart's electrical activity. Doctors most often [place] these monitors to diagnose arrhythmias. Arrhythmias are problems with the speed or rhythm of the heartbeat. The monitor is a small, portable device. You can wear one while you do your normal daily activities. This is usually used to diagnose what is causing palpitations/syncope (passing out).

## 2014-01-17 ENCOUNTER — Ambulatory Visit (INDEPENDENT_AMBULATORY_CARE_PROVIDER_SITE_OTHER): Payer: BC Managed Care – PPO | Admitting: Internal Medicine

## 2014-01-17 ENCOUNTER — Encounter: Payer: Self-pay | Admitting: Internal Medicine

## 2014-01-17 VITALS — BP 93/56 | HR 53 | Resp 16 | Ht 65.0 in | Wt 176.0 lb

## 2014-01-17 DIAGNOSIS — H9312 Tinnitus, left ear: Secondary | ICD-10-CM

## 2014-01-17 DIAGNOSIS — R42 Dizziness and giddiness: Secondary | ICD-10-CM | POA: Diagnosis not present

## 2014-01-17 DIAGNOSIS — G4700 Insomnia, unspecified: Secondary | ICD-10-CM | POA: Diagnosis not present

## 2014-01-17 DIAGNOSIS — H9319 Tinnitus, unspecified ear: Secondary | ICD-10-CM

## 2014-01-17 MED ORDER — LORAZEPAM 0.5 MG PO TABS: ORAL_TABLET | ORAL | Status: DC

## 2014-01-17 NOTE — Patient Instructions (Signed)
Will refer to Dr. Constance Holster    See me as needed

## 2014-01-17 NOTE — Progress Notes (Signed)
Subjective:    Patient ID: Sharon Cole, female    DOB: [birth year], 58 y.o.   MRN: [id number]  HPI Last OV Dizziness: See EKG She has sinus bradycardia May represent vertigo and will try antivert 25 mg if not better will need further eval.  Dull abd pain With nausea Will get labs today see me in 2-3 weeks if persistant will consider imaging. She had some diarrhea and this may be low level GE but will follow expectantly  Recurrent biliary stones Will get labs today  Depression Continue Lexapro  Severe GERD She is on high dose prilosec bid and that is the only thing that controls her. Ok to continue  Elevated alk phos See above.   Today: Sharon Cole is here for follow up   She has seen cardiologist who plans a 30 day event moniter   Dizziness has resolved .  Lower abd cramping resolved  No diarrhea no pain   She reports she has been on low dose Ativan for "years" at night to help her sleep.  She is thinking she would like to try to come off medication soon  Left ear feels full with "weird noises"  Almost like a buzzing  She would like to see an ENT   Allergies  Allergen Reactions  . Zofran [Ondansetron Hcl]     intolerance   Past Medical History  Diagnosis Date  . Anxiety   . Depression   . Menopause   . Bradycardia   . Syncope    Past Surgical History  Procedure Laterality Date  . Breast surgery      breast reduction  . Cholecystectomy    . Knee arthroscopy     History   Social History  . Marital Status: Unknown    Spouse Name: N/A    Number of Children: N/A  . Years of Education: N/A   Occupational History  . Not on file.   Social History Main Topics  . Smoking status: Never Smoker   . Smokeless tobacco: Never Used  . Alcohol Use: 0.0 oz/week    0-2 Glasses of wine per week     Comment: daily  . Drug Use: No  . Sexual Activity: Yes   Other Topics Concern  . Not on file   Social History Narrative  . No narrative on file   Family History  Problem  Relation Age of Onset  . Heart disease Mother   . Heart disease Brother   . Heart disease Maternal Grandmother    Patient Active Problem List   Diagnosis Date Noted  . Bradycardia 01/14/2014  . Syncope 01/14/2014  . Gallstone pancreatitis 09/27/2013  . PUD (peptic ulcer disease)  duodenal  2011 09/27/2013  . S/P cholecystectomy 09/27/2013  . Anxiety 08/10/2013  .  Biliary calculi recurrent S/p Roux-en-Y [facility] 08/10/2013  . Depression 08/10/2013  . GERD (gastroesophageal reflux disease) 08/10/2013  . Pancreatitis 08/10/2013  . Menopause 08/10/2013  . Peptic ulcer disease  remote 08/10/2013   Current Outpatient Prescriptions on File Prior to Visit  Medication Sig Dispense Refill  . escitalopram (LEXAPRO) 20 MG tablet Take one tablet bid  180 tablet  1  . LORazepam (ATIVAN) 0.5 MG tablet Take 1 tablet (0.5 mg total) by mouth every 8 (eight) hours.  30 tablet  2  . meclizine (ANTIVERT) 25 MG tablet Take one tablet bid prn dizziness  30 tablet  0  . nystatin-triamcinolone ointment (MYCOLOG) Apply 1 application topically 2 (two) times daily.  30 g  0  . omeprazole (PRILOSEC) 40 MG capsule Take 1 capsule (40 mg total) by mouth 2 (two) times daily before a meal.  180 capsule  0  . promethazine (PHENERGAN) 12.5 MG tablet Take 1 tablet (12.5 mg total) by mouth every 8 (eight) hours as needed for nausea or vomiting.  20 tablet  0   No current facility-administered medications on file prior to visit.        Review of Systems See HPI    Objective:   Physical Exam Physical Exam  Nursing note and vitals reviewed.  Repeat BP  94/60 Constitutional: She is oriented to person, place, and time. She appears well-developed and well-nourished.  HENT:  TM:  Left ear  Canal clear  Serous fluid behind drum  Right TM normal  Head: Normocephalic and atraumatic.  Cardiovascular: Normal rate and regular rhythm. Exam reveals no gallop and no friction rub.  No murmur heard.  Pulmonary/Chest: Breath  sounds normal. She has no wheezes. She has no rales.  Neurological: She is alert and oriented to person, place, and time.  Skin: Skin is warm and dry.  Psychiatric: She has a normal mood and affect. Her behavior is normal.              Assessment & Plan:  Dizziness/bradycardia  Work up pending  She has 30 day moniter on  Left ear tinnitus    Pt would like to see and ENT  Ok to refer   Chronic insomnia ativan use  I offered referral to psychiatrist or neurologist to try to taper of BZP  Advised do not stop cold [place].  She wishes to think about this   See me as needed

## 2014-01-18 ENCOUNTER — Other Ambulatory Visit: Payer: Self-pay | Admitting: Internal Medicine

## 2014-01-18 DIAGNOSIS — G4700 Insomnia, unspecified: Secondary | ICD-10-CM

## 2014-01-20 ENCOUNTER — Ambulatory Visit
Admission: RE | Admit: 2014-01-20 | Discharge: 2014-01-20 | Disposition: A | Discharge status: Home / Self Care | Payer: BC Managed Care – PPO | Source: Ambulatory Visit | Attending: Internal Medicine | Admitting: Internal Medicine

## 2014-01-20 DIAGNOSIS — Z1231 Encounter for screening mammogram for malignant neoplasm of breast: Secondary | ICD-10-CM

## 2014-01-21 ENCOUNTER — Ambulatory Visit (INDEPENDENT_AMBULATORY_CARE_PROVIDER_SITE_OTHER): Payer: BC Managed Care – PPO | Admitting: Psychology

## 2014-01-21 DIAGNOSIS — F331 Major depressive disorder, recurrent, moderate: Secondary | ICD-10-CM

## 2014-01-24 ENCOUNTER — Other Ambulatory Visit: Payer: Self-pay | Admitting: Internal Medicine

## 2014-01-24 NOTE — Telephone Encounter (Signed)
Refill request

## 2014-01-27 ENCOUNTER — Ambulatory Visit (INDEPENDENT_AMBULATORY_CARE_PROVIDER_SITE_OTHER): Payer: BC Managed Care – PPO | Admitting: Psychology

## 2014-01-27 DIAGNOSIS — F331 Major depressive disorder, recurrent, moderate: Secondary | ICD-10-CM

## 2014-02-03 ENCOUNTER — Ambulatory Visit: Payer: BC Managed Care – PPO | Admitting: Psychology

## 2014-02-11 ENCOUNTER — Ambulatory Visit (INDEPENDENT_AMBULATORY_CARE_PROVIDER_SITE_OTHER): Payer: BC Managed Care – PPO | Admitting: Psychology

## 2014-02-11 DIAGNOSIS — F332 Major depressive disorder, recurrent severe without psychotic features: Secondary | ICD-10-CM

## 2014-02-17 ENCOUNTER — Ambulatory Visit (INDEPENDENT_AMBULATORY_CARE_PROVIDER_SITE_OTHER): Payer: BC Managed Care – PPO | Admitting: Psychology

## 2014-02-17 DIAGNOSIS — F332 Major depressive disorder, recurrent severe without psychotic features: Secondary | ICD-10-CM

## 2014-02-24 ENCOUNTER — Ambulatory Visit: Payer: BC Managed Care – PPO | Admitting: Psychology

## 2014-02-25 ENCOUNTER — Ambulatory Visit (INDEPENDENT_AMBULATORY_CARE_PROVIDER_SITE_OTHER): Payer: BC Managed Care – PPO | Admitting: Psychology

## 2014-02-25 DIAGNOSIS — F332 Major depressive disorder, recurrent severe without psychotic features: Secondary | ICD-10-CM

## 2014-03-07 ENCOUNTER — Encounter: Payer: Self-pay | Admitting: Internal Medicine

## 2014-03-11 ENCOUNTER — Ambulatory Visit (INDEPENDENT_AMBULATORY_CARE_PROVIDER_SITE_OTHER): Payer: BC Managed Care – PPO | Admitting: Psychology

## 2014-03-11 DIAGNOSIS — F332 Major depressive disorder, recurrent severe without psychotic features: Secondary | ICD-10-CM

## 2014-03-17 ENCOUNTER — Ambulatory Visit (INDEPENDENT_AMBULATORY_CARE_PROVIDER_SITE_OTHER): Payer: BC Managed Care – PPO | Admitting: Psychology

## 2014-03-17 DIAGNOSIS — F332 Major depressive disorder, recurrent severe without psychotic features: Secondary | ICD-10-CM

## 2014-03-22 ENCOUNTER — Telehealth: Payer: Self-pay | Admitting: Internal Medicine

## 2014-03-22 NOTE — Telephone Encounter (Signed)
Sharon Cole [phone] [facility] needing a refill on her LORazepam (ATIVAN) 0.5 MG tablet

## 2014-03-22 NOTE — Telephone Encounter (Signed)
Refill request

## 2014-03-23 ENCOUNTER — Other Ambulatory Visit: Payer: Self-pay | Admitting: Internal Medicine

## 2014-03-23 NOTE — Telephone Encounter (Signed)
RX called in -eh 

## 2014-03-24 ENCOUNTER — Ambulatory Visit (INDEPENDENT_AMBULATORY_CARE_PROVIDER_SITE_OTHER): Payer: BC Managed Care – PPO | Admitting: Psychology

## 2014-03-24 DIAGNOSIS — F332 Major depressive disorder, recurrent severe without psychotic features: Secondary | ICD-10-CM

## 2014-04-08 ENCOUNTER — Ambulatory Visit: Payer: BC Managed Care – PPO | Admitting: Psychology

## 2014-04-14 ENCOUNTER — Ambulatory Visit (INDEPENDENT_AMBULATORY_CARE_PROVIDER_SITE_OTHER): Payer: BC Managed Care – PPO | Admitting: Psychology

## 2014-04-14 DIAGNOSIS — F332 Major depressive disorder, recurrent severe without psychotic features: Secondary | ICD-10-CM

## 2014-04-21 ENCOUNTER — Ambulatory Visit (INDEPENDENT_AMBULATORY_CARE_PROVIDER_SITE_OTHER): Payer: BC Managed Care – PPO | Admitting: Psychology

## 2014-04-21 DIAGNOSIS — F332 Major depressive disorder, recurrent severe without psychotic features: Secondary | ICD-10-CM

## 2014-05-05 ENCOUNTER — Ambulatory Visit (INDEPENDENT_AMBULATORY_CARE_PROVIDER_SITE_OTHER): Payer: BC Managed Care – PPO | Admitting: Psychology

## 2014-05-05 DIAGNOSIS — F332 Major depressive disorder, recurrent severe without psychotic features: Secondary | ICD-10-CM

## 2014-05-12 ENCOUNTER — Ambulatory Visit: Payer: BC Managed Care – PPO | Admitting: Psychology

## 2014-05-19 ENCOUNTER — Ambulatory Visit: Payer: Self-pay | Admitting: Psychology

## 2014-05-24 ENCOUNTER — Other Ambulatory Visit: Payer: Self-pay | Admitting: *Deleted

## 2014-05-24 DIAGNOSIS — Z760 Encounter for issue of repeat prescription: Secondary | ICD-10-CM

## 2014-05-24 MED ORDER — LORAZEPAM 0.5 MG PO TABS: ORAL_TABLET | ORAL | Status: DC

## 2014-05-24 NOTE — Telephone Encounter (Signed)
Needs refill on Lorazapam

## 2014-05-24 NOTE — Telephone Encounter (Signed)
R/X phoned in.

## 2014-05-26 ENCOUNTER — Ambulatory Visit (INDEPENDENT_AMBULATORY_CARE_PROVIDER_SITE_OTHER): Payer: BLUE CROSS/BLUE SHIELD | Admitting: Psychology

## 2014-05-26 DIAGNOSIS — F332 Major depressive disorder, recurrent severe without psychotic features: Secondary | ICD-10-CM

## 2014-06-02 ENCOUNTER — Ambulatory Visit (INDEPENDENT_AMBULATORY_CARE_PROVIDER_SITE_OTHER): Payer: BLUE CROSS/BLUE SHIELD | Admitting: Psychology

## 2014-06-02 DIAGNOSIS — F332 Major depressive disorder, recurrent severe without psychotic features: Secondary | ICD-10-CM

## 2014-06-09 ENCOUNTER — Ambulatory Visit: Payer: BLUE CROSS/BLUE SHIELD | Admitting: Psychology

## 2014-06-10 ENCOUNTER — Ambulatory Visit (INDEPENDENT_AMBULATORY_CARE_PROVIDER_SITE_OTHER): Payer: BLUE CROSS/BLUE SHIELD | Admitting: Psychology

## 2014-06-10 DIAGNOSIS — F332 Major depressive disorder, recurrent severe without psychotic features: Secondary | ICD-10-CM

## 2014-06-17 ENCOUNTER — Ambulatory Visit (INDEPENDENT_AMBULATORY_CARE_PROVIDER_SITE_OTHER): Payer: BLUE CROSS/BLUE SHIELD | Admitting: Psychology

## 2014-06-17 DIAGNOSIS — F332 Major depressive disorder, recurrent severe without psychotic features: Secondary | ICD-10-CM

## 2014-06-24 ENCOUNTER — Ambulatory Visit (INDEPENDENT_AMBULATORY_CARE_PROVIDER_SITE_OTHER): Payer: BLUE CROSS/BLUE SHIELD | Admitting: Psychology

## 2014-06-24 DIAGNOSIS — F332 Major depressive disorder, recurrent severe without psychotic features: Secondary | ICD-10-CM

## 2014-07-01 ENCOUNTER — Ambulatory Visit: Payer: BLUE CROSS/BLUE SHIELD | Admitting: Psychology

## 2014-07-08 ENCOUNTER — Ambulatory Visit (INDEPENDENT_AMBULATORY_CARE_PROVIDER_SITE_OTHER): Payer: BLUE CROSS/BLUE SHIELD | Admitting: Psychology

## 2014-07-08 DIAGNOSIS — F332 Major depressive disorder, recurrent severe without psychotic features: Secondary | ICD-10-CM | POA: Diagnosis not present

## 2014-07-15 ENCOUNTER — Ambulatory Visit (INDEPENDENT_AMBULATORY_CARE_PROVIDER_SITE_OTHER): Payer: BLUE CROSS/BLUE SHIELD | Admitting: Psychology

## 2014-07-15 DIAGNOSIS — F332 Major depressive disorder, recurrent severe without psychotic features: Secondary | ICD-10-CM

## 2014-07-22 ENCOUNTER — Other Ambulatory Visit: Payer: Self-pay | Admitting: *Deleted

## 2014-07-22 ENCOUNTER — Ambulatory Visit (INDEPENDENT_AMBULATORY_CARE_PROVIDER_SITE_OTHER): Payer: BLUE CROSS/BLUE SHIELD | Admitting: Psychology

## 2014-07-22 DIAGNOSIS — Z760 Encounter for issue of repeat prescription: Secondary | ICD-10-CM

## 2014-07-22 DIAGNOSIS — F332 Major depressive disorder, recurrent severe without psychotic features: Secondary | ICD-10-CM

## 2014-07-22 MED ORDER — LORAZEPAM 0.5 MG PO TABS: ORAL_TABLET | ORAL | Status: DC

## 2014-07-22 NOTE — Telephone Encounter (Signed)
Refill request

## 2014-07-22 NOTE — Telephone Encounter (Signed)
RX called in .

## 2014-08-02 ENCOUNTER — Ambulatory Visit (INDEPENDENT_AMBULATORY_CARE_PROVIDER_SITE_OTHER): Payer: BLUE CROSS/BLUE SHIELD | Admitting: Internal Medicine

## 2014-08-02 ENCOUNTER — Encounter (HOSPITAL_BASED_OUTPATIENT_CLINIC_OR_DEPARTMENT_OTHER): Payer: Self-pay | Admitting: *Deleted

## 2014-08-02 ENCOUNTER — Emergency Department (HOSPITAL_BASED_OUTPATIENT_CLINIC_OR_DEPARTMENT_OTHER): Payer: BLUE CROSS/BLUE SHIELD

## 2014-08-02 ENCOUNTER — Encounter: Payer: Self-pay | Admitting: Internal Medicine

## 2014-08-02 ENCOUNTER — Emergency Department (HOSPITAL_BASED_OUTPATIENT_CLINIC_OR_DEPARTMENT_OTHER)
Admission: EM | Admit: 2014-08-02 | Discharge: 2014-08-02 | Disposition: A | Discharge status: Home / Self Care | Payer: BLUE CROSS/BLUE SHIELD | Attending: Emergency Medicine | Admitting: Emergency Medicine

## 2014-08-02 VITALS — BP 78/60 | HR 56 | Resp 16 | Ht 65.5 in | Wt 185.0 lb

## 2014-08-02 DIAGNOSIS — X58XXXA Exposure to other specified factors, initial encounter: Secondary | ICD-10-CM | POA: Diagnosis not present

## 2014-08-02 DIAGNOSIS — Y998 Other external cause status: Secondary | ICD-10-CM | POA: Diagnosis not present

## 2014-08-02 DIAGNOSIS — Y9389 Activity, other specified: Secondary | ICD-10-CM | POA: Diagnosis not present

## 2014-08-02 DIAGNOSIS — Z79899 Other long term (current) drug therapy: Secondary | ICD-10-CM | POA: Insufficient documentation

## 2014-08-02 DIAGNOSIS — Y9289 Other specified places as the place of occurrence of the external cause: Secondary | ICD-10-CM | POA: Insufficient documentation

## 2014-08-02 DIAGNOSIS — H9319 Tinnitus, unspecified ear: Secondary | ICD-10-CM | POA: Diagnosis not present

## 2014-08-02 DIAGNOSIS — Z8742 Personal history of other diseases of the female genital tract: Secondary | ICD-10-CM | POA: Diagnosis not present

## 2014-08-02 DIAGNOSIS — R42 Dizziness and giddiness: Secondary | ICD-10-CM

## 2014-08-02 DIAGNOSIS — S0990XA Unspecified injury of head, initial encounter: Secondary | ICD-10-CM

## 2014-08-02 MED ORDER — MECLIZINE HCL 25 MG PO TABS
25.0000 mg | ORAL_TABLET | Freq: Once | ORAL | Status: AC
  Administered 2014-08-02: 25 mg via ORAL
  Filled 2014-08-02: qty 1

## 2014-08-02 MED ORDER — SODIUM CHLORIDE 0.9 % IV BOLUS (SEPSIS)
1000.0000 mL | Freq: Once | INTRAVENOUS | Status: AC
  Administered 2014-08-02: 1000 mL via INTRAVENOUS

## 2014-08-02 MED ORDER — ONDANSETRON HCL 4 MG/2ML IJ SOLN
4.0000 mg | Freq: Once | INTRAMUSCULAR | Status: AC
  Administered 2014-08-02: 4 mg via INTRAVENOUS
  Filled 2014-08-02: qty 2

## 2014-08-02 MED ORDER — MECLIZINE HCL 25 MG PO TABS: 25.0000 mg | ORAL_TABLET | Freq: Four times a day (QID) | ORAL | Status: DC

## 2014-08-02 MED ORDER — SODIUM CHLORIDE 0.9 % IV SOLN: INTRAVENOUS | Status: DC

## 2014-08-02 NOTE — Progress Notes (Signed)
Subjective:    Patient ID: Sharon Cole, female    DOB: [birth year], 59 y.o.   MRN: [id number]  HPI  01/17/2014 note Assessment & Plan:  Dizziness/bradycardia Work up pending She has 30 day moniter on  Left ear tinnitus Pt would like to see and ENT Ok to refer   Chronic insomnia ativan use I offered referral to psychiatrist or neurologist to try to taper of BZP Advised do not stop cold [place]. She wishes to think about this   See me as needed        TODAY:  Nur is here for acute visit .  Since last visit she did have cardiac evaluation with 30 day event moniter which was unrevealing.  Cardiology felt she has asymptomatic bradycardia.  She never went for ENT eval.   She was also referred to neurology but did not keep this appt as well  Pt describes syncopal episode 10 days ago where she fell and hit her head.  Did not seek medical attention.  After falling began with "spinning sensation"  - she has known vertigo.  Had been taking antivert and vertigo partiallyimproved but still very dizzy.   Three days ago fell again but no LOC and no head injury   Some Nausea with vertigo and tinnitus that has been chronic for her   No hearing deficit reported   Allergies  Allergen Reactions  . Zofran [Ondansetron Hcl]     intolerance   Past Medical History  Diagnosis Date  . Anxiety   . Depression   . Menopause   . Bradycardia   . Syncope    Past Surgical History  Procedure Laterality Date  . Breast surgery      breast reduction  . Cholecystectomy    . Knee arthroscopy     History   Social History  . Marital Status: Unknown    Spouse Name: N/A  . Number of Children: N/A  . Years of Education: N/A   Occupational History  . Not on file.   Social History Main Topics  . Smoking status: Never Smoker   . Smokeless tobacco: Never Used  . Alcohol Use: 0.0 oz/week    0-2 Glasses of wine per week     Comment: daily  . Drug Use: No  . Sexual Activity: Yes    Other Topics Concern  . Not on file   Social History Narrative   Family History  Problem Relation Age of Onset  . Heart disease Mother   . Heart disease Brother   . Heart disease Maternal Grandmother    Patient Active Problem List   Diagnosis Date Noted  . Insomnia  chronic on Ativan for years 01/17/2014  . Bradycardia 01/14/2014  . Syncope 01/14/2014  . Gallstone pancreatitis 09/27/2013  . PUD (peptic ulcer disease)  duodenal  2011 09/27/2013  . S/P cholecystectomy 09/27/2013  . Anxiety 08/10/2013  .  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 Depression 08/10/2013  . GERD (gastroesophageal reflux disease) 08/10/2013  . Pancreatitis 08/10/2013  . Menopause 08/10/2013  . Peptic ulcer disease  remote 08/10/2013   Current Outpatient Prescriptions on File Prior to Visit  Medication Sig Dispense Refill  . escitalopram (LEXAPRO) 20 MG tablet Take one tablet bid 180 tablet 1  . LORazepam (ATIVAN) 0.5 MG tablet Take 1/2 at bedtime prn 30 tablet 1  . meclizine (ANTIVERT) 25 MG tablet Take one tablet bid prn dizziness 30 tablet 0  . nystatin-triamcinolone ointment (MYCOLOG)  Apply 1 application topically 2 (two) times daily. 30 g 0  . omeprazole (PRILOSEC) 40 MG capsule Take 1 capsule (40 mg total) by mouth 2 (two) times daily before a meal. 180 capsule 0  . omeprazole (PRILOSEC) 40 MG capsule TAKE ONE CAPSULE BY MOUTH TWICE A DAY BEFORE A MEAL 180 capsule 1  . promethazine (PHENERGAN) 12.5 MG tablet Take 1 tablet (12.5 mg total) by mouth every 8 (eight) hours as needed for nausea or vomiting. 20 tablet 0   No current facility-administered medications on file prior to visit.      Review of Systems See HPI    Objective:   Physical Exam Physical Exam  Nursing note and vitals reviewed.  Constitutional: She is oriented to person, place, and time. She appears well-developed and well-nourished.  HENT:  Head: Normocephalic and atraumatic.  Cardiovascular:  Normal rate and regular rhythm. Exam reveals no gallop and no friction rub.  No murmur heard.  Pulmonary/Chest: Breath sounds normal. She has no wheezes. She has no rales.  Neurological: She is alert and oriented to person, place, and time.  Skin: Skin is warm and dry.  Neurologic: CN II-Xii intact no nystagmus to extreme lateral gaze Reflexes 2+ symmetric Motor 5/5 UE and LE Coordination intact  Non focal exam Psychiatric: She has a normal mood and affect. Her behavior is normal.         Assessment & Plan:  Syncope with head injury    EKG today no acute change.  She has known sinus bradycardia rate 54 today.   She is orthostatic on exam.  Will send to ER for rehydrations and further eval with Head CT  Had cardiology work up with 30 day holter which was unrevealing.   She never went for neurology eval  Vertigo may have been exacerbated by fall.  Will set up with neurorehab and ENT .  She also has chronic tinnitus that has never been evaluated  Sinus bradycardia  See cardiology note

## 2014-08-02 NOTE — ED Notes (Addendum)
Patient states that she has been experiencing vertigo for two years and passed out last Sunday. She states that her blood pressure has been running low, has had a headache since last week and is experiencing nausea. She was seeing MD upstairs today and was sent here.

## 2014-08-02 NOTE — ED Provider Notes (Signed)
CSN: [id number]     Arrival date & time 08/02/14  1007 History   First MD Initiated Contact with Patient 08/02/14 1024     Chief Complaint  Patient presents with  . Dizziness     (Consider location/radiation/quality/duration/timing/severity/associated sxs/prior Treatment) Patient is a 59 y.o. female presenting with dizziness. The history is provided by the patient.  Dizziness Associated symptoms: nausea   Associated symptoms: no chest pain, no diarrhea, no shortness of breath and no vomiting    patient referred down from primary care office for head CT and IV fluids. Patient 10 days ago had a syncopal episode at the back of her head. Following that has had vertigo. Patient has had vertigo in the past but has never had a full workup. Patient has a long-standing history of syncope followed closely with extensive workup by cardiology without exact cause is. Patient often has hypotensive episodes. Systolic blood pressure she states is normally just in the 90s. They do not know the cause of this and they have not been successful in resolving the periods of hypotension. Patient did take some Antivert at home at first but hasn't taken any lately. Patient denies chest pain shortness of breath. Patient has been experiencing nausea which seemed to be improved by the [place].  Past Medical History  Diagnosis Date  . Anxiety   . Depression   . Menopause   . Bradycardia   . Syncope    Past Surgical History  Procedure Laterality Date  . Breast surgery      breast reduction  . Cholecystectomy    . Knee arthroscopy     Family History  Problem Relation Age of Onset  . Heart disease Mother   . Heart disease Brother   . Heart disease Maternal Grandmother    History  Substance Use Topics  . Smoking status: Never Smoker   . Smokeless tobacco: Never Used  . Alcohol Use: 0.0 oz/week    0-2 Glasses of wine per week     Comment: daily   OB History    Gravida Para Term Preterm AB TAB SAB Ectopic  Multiple Living   4    2  2   2      Review of Systems  Constitutional: Positive for fatigue.  HENT: Negative for congestion.   Eyes: Negative for photophobia.  Respiratory: Negative for shortness of breath.   Cardiovascular: Negative for chest pain.  Gastrointestinal: Positive for nausea. Negative for vomiting, abdominal pain and diarrhea.  Genitourinary: Negative for hematuria.  Musculoskeletal: Negative for back pain and neck pain.  Skin: Negative for rash.  Neurological: Positive for dizziness and syncope.  Hematological: Does not bruise/bleed easily.  Psychiatric/Behavioral: Negative for confusion.      Allergies  Review of patient's allergies indicates no active allergies.  Home Medications   Prior to Admission medications   Medication Sig Start Date End Date Taking? Authorizing Provider  escitalopram (LEXAPRO) 20 MG tablet Take one tablet bid Patient taking differently: daily. Take one tablet bid 12/22/13   Lanice Shirts, MD  LORazepam (ATIVAN) 0.5 MG tablet Take 1/2 at bedtime prn 07/22/14   Lanice Shirts, MD  meclizine (ANTIVERT) 25 MG tablet Take one tablet bid prn dizziness 12/22/13   Lanice Shirts, MD  meclizine (ANTIVERT) 25 MG tablet Take 1 tablet (25 mg total) by mouth 4 (four) times daily. 08/02/14   Fredia Sorrow, MD  nystatin-triamcinolone ointment [facility]) Apply 1 application topically 2 (two) times daily. 12/22/13   Neoma Laming  Driscilla Moats, MD  omeprazole (PRILOSEC) 40 MG capsule Take 1 capsule (40 mg total) by mouth 2 (two) times daily before a meal. 12/22/13   Lanice Shirts, MD  promethazine (PHENERGAN) 12.5 MG tablet Take 1 tablet (12.5 mg total) by mouth every 8 (eight) hours as needed for nausea or vomiting. 12/22/13   Lanice Shirts, MD   BP 115/66 mmHg  Pulse 54  Temp(Src) 97.9 F (36.6 C) (Oral)  Resp 18  SpO2 100% Physical Exam  Constitutional: She is oriented to person, place, and time. She appears well-developed and  well-nourished. No distress.  HENT:  Head: Normocephalic and atraumatic.  Mouth/Throat: Oropharynx is clear and moist.  Eyes: Conjunctivae and EOM are normal. Pupils are equal, round, and reactive to light.  Neck: Normal range of motion. Neck supple.  Cardiovascular: Normal rate, regular rhythm and normal heart sounds.   No murmur heard. Pulmonary/Chest: Effort normal and breath sounds normal. No respiratory distress.  Abdominal: Soft. Bowel sounds are normal. There is no tenderness.  Musculoskeletal: Normal range of motion.  Neurological: She is alert and oriented to person, place, and time. No cranial nerve deficit. She exhibits normal muscle tone. Coordination normal.  Skin: Skin is warm. No rash noted.  Nursing note and vitals reviewed.   ED Course  Procedures (including critical care time) Labs Review Labs Reviewed - No data to display  Imaging Review Ct Head Wo Contrast  08/02/2014   CLINICAL DATA:  Dizziness and vertigo for past 8 days. Patient suffered a recent fall.  EXAM: CT HEAD WITHOUT CONTRAST  TECHNIQUE: Contiguous axial images were obtained from the base of the skull through the vertex without intravenous contrast.  COMPARISON:  None.  FINDINGS: No evidence for acute infarction, hemorrhage, mass lesion, hydrocephalus, or extra-axial fluid. Normal cerebral volume. No white matter disease. Physiologic calcification of the BILATERAL globus pallidus. Calvarium intact. No sinus or mastoid fluid.  IMPRESSION: Negative exam.   Electronically Signed   By: Rolla Flatten M.D.   On: 08/02/2014 10:53     EKG Interpretation None      MDM   Final diagnoses:  Vertigo  Head injury, initial encounter   Patient sent down from her primary care office upstairs with the recommendation for head CT and IV fluids. Patient had a fall 10 days ago striking her head since that time his had vertigo. Patient's had vertigo in the past. Patient has not had a complete workup for the vertigo up to  this point in time. Patient did have some Antivert at home and to try that early on but then stopped it.  Patient followed with extensive workups by cardiology in the past for hypotension and syncopal episodes. The fall that occurred 10 days ago was related to a syncopal episode. Patient primary care doctor wanted her to receive fluids and have head CT. Primary care doctor will take the workup for the vertigo from this point forward and is already arranged ear nose and throat follow-up. Patient's primary care doctor due to her extensive workup for the syncopal and hypotension in the past does not feel that needs extensive workup here today for that.  Patient's head CT here is negative patient given one dose of Antivert. Patient's blood pressure recorded here was showing systolics of 300 or better. Up in the office she had significant orthostatic hypotension were blood pressure dropped down to 78. Patient states that she normally has blood pressures only in the 90s. This is been present for many  years exact cause is not the been sorted out and they have not been successful in alleviating.    Fredia Sorrow, MD 08/02/14 1204

## 2014-08-02 NOTE — Discharge Instructions (Signed)
Follow up with ear nose and throat is arranged by your regular Dr. head CT here today was negative. Blood pressure is actually here or with systolics above 757. Continue take the Antivert for the vertigo. Make an appointment to follow back up with your regular doctor. Return for any new or worse symptoms.

## 2014-08-05 ENCOUNTER — Ambulatory Visit (INDEPENDENT_AMBULATORY_CARE_PROVIDER_SITE_OTHER): Payer: BLUE CROSS/BLUE SHIELD | Admitting: Psychology

## 2014-08-05 DIAGNOSIS — F332 Major depressive disorder, recurrent severe without psychotic features: Secondary | ICD-10-CM

## 2014-08-08 ENCOUNTER — Ambulatory Visit: Payer: BLUE CROSS/BLUE SHIELD | Attending: Internal Medicine

## 2014-08-08 DIAGNOSIS — R42 Dizziness and giddiness: Secondary | ICD-10-CM | POA: Diagnosis not present

## 2014-08-08 DIAGNOSIS — R269 Unspecified abnormalities of gait and mobility: Secondary | ICD-10-CM | POA: Insufficient documentation

## 2014-08-08 DIAGNOSIS — H8110 Benign paroxysmal vertigo, unspecified ear: Secondary | ICD-10-CM | POA: Diagnosis not present

## 2014-08-08 NOTE — Patient Instructions (Signed)
Educated briefly on potential causes of positional vertigo and vestibular hypofunction.  Educated to expect some wooziness/off balance feeling x 24 hours after CRT and to sit for several seconds prior to rising after lying supine.  Also encouraged to sit in lobby prior to leaving clinic due to pt drove self.  States can call her boyfriend if needed.

## 2014-08-08 NOTE — Therapy (Signed)
[facility] [address] [place], [place], [address] Phone: [phone]   Fax:  [phone]  Physical Therapy Evaluation  Patient Details  Name: Sharon Cole MRN: [id number] Date of Birth: 18-Dec-1955 Referring Provider:  Lanice Shirts, *  Encounter Date: 08/08/2014      PT End of Session - 08/08/14 0907    Visit Number 1   Number of Visits 5   Date for PT Re-Evaluation 09/09/14   PT Start Time 0814   PT Stop Time 0852   PT Time Calculation (min) 38 min   Activity Tolerance Patient tolerated treatment well   Behavior During Therapy [facility] for tasks assessed/performed      Past Medical History  Diagnosis Date  . Anxiety   . Depression   . Menopause   . Bradycardia   . Syncope     Past Surgical History  Procedure Laterality Date  . Breast surgery      breast reduction  . Cholecystectomy    . Knee arthroscopy      There were no vitals filed for this visit.  Visit Diagnosis:  Dizziness and giddiness - Plan: PT plan of care cert/re-cert  Abnormality of gait - Plan: PT plan of care cert/re-cert      Subjective Assessment - 08/08/14 0814    Subjective Patient presents with 2 week history of "vertigo".  Reports symptoms of imbalance, and spinning when laying down and standing up.  Symptoms last a few seconds.  Reports fell few weeks ago likely due to low blood pressure (orthostasis,) for which she is being treated by MD.     Pertinent History Reports has had vertigo on two previous occasions; one within past 6 months and one few years ago.  Denies head trauma during those episodes, but had about 3 weeks of car sickness symptoms that gradually resolved.   Currently in Pain? No/denies            [facility] PT Assessment - 08/09/14 0001    Observation/Other Assessments   Focus on Therapeutic Outcomes (FOTO)  Intake 40% limitation 13% predicted            Vestibular Assessment - 08/08/14 0817     Vestibular Assessment   General Observation Patient ambulating into clinic independently without devices or loss of balance.  Wears glasses with progressive lenses, new about 2 months ago; reports smaller lens and some difficulty getting used to it.  Upon leaving clinic patient more off balance and provided close supervision, minguard assist and pt holding counter.     Symptom Behavior   Type of Dizziness Spinning   Frequency of Dizziness every time lying down and sitting up   Duration of Dizziness seconds   Aggravating Factors Sit to stand;Lying supine;Looking up to the ceiling   Relieving Factors Head stationary;Slow movements   Occulomotor Exam   Occulomotor Alignment Normal   Spontaneous Absent   Gaze-induced Absent   Head shaking Horizontal Absent  symptoms 3-4/10   Head Shaking Vertical Absent  symptoms 5/10   Smooth Pursuits Intact   Saccades Intact   Comment mild symptoms of dizziness/nausea with eye testing   Vestibulo-Occular Reflex   VOR 1 Head Only (x 1 viewing) WNL with symptoms 3-4/10   VOR to Slow Head Movement Normal   VOR Cancellation Normal   Auditory   Comments hearing intact and equal bilateral to scratch test   Other Tests   Tragal negative    Positional Testing  Dix-Hallpike Dix-Hallpike Right;Dix-Hallpike Left   Horizontal Canal Testing Horizontal Canal Right;Horizontal Canal Left;Horizontal Canal Right Intensity   Dix-Hallpike Right   Dix-Hallpike Right Duration 45 sec   Dix-Hallpike Right Symptoms Left nystagmus   Dix-Hallpike Left   Dix-Hallpike Left Duration 35   Dix-Hallpike Left Symptoms No nystagmus   Horizontal Canal Right   Horizontal Canal Right Duration 45   Horizontal Canal Right Symptoms Ageotrophic   Horizontal Canal Left   Horizontal Canal Left Duration 30   Horizontal Canal Left Symptoms Normal   Horizontal Canal Right Intensity   Horizontal Canal Right Intensity Mild   Right Intensity Comment initial testing more intense than  after CRT for right posterior canal   Cognition   Cognition Orientation Level Oriented x 4                       PT Education - 08/08/14 0906    Education provided Yes   Education Details DDX; precautions following CRT   Person(s) Educated Patient   Methods Explanation   Comprehension Verbalized understanding             PT Long Term Goals - 08/08/14 0912    PT LONG TERM GOAL #1   Title Patient will demonstrate no further nystagmus in right DHP, and right head rotated position.  09/09/14   Status New   PT LONG TERM GOAL #2   Title Patient will be independent in HEP for balance and habituation for motion sensitivity.  09/09/14   Status New   PT LONG TERM GOAL #3   Title Patient will report decreased symptoms on FOTO survey with no more than 20% limitation.   09/09/14   Status New   PT LONG TERM GOAL #4   Title Patient will complete SOT and set goal as appropriate for improved sensory use for balance.  09/09/14.   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  She also reports imbalance and recent fall due to orthostatic hypotension (medical treatment via fluids in ED.)  She will benefit from skilled PT to address vertigo and imbalance to improve safety and confidence with mobility.   Pt will benefit from skilled therapeutic intervention in order to improve on the following deficits Abnormal gait;Decreased mobility;Decreased balance;Other (comment)  vertigo   Rehab Potential Good   PT Frequency 1x / week   PT Duration 4 weeks   PT Treatment/Interventions ADLs/Self Care Home Management;Therapeutic activities;Patient/family education;Therapeutic exercise;Functional mobility training;Neuromuscular re-education;Gait training;Balance training;Other (comment)  vestibular rehab   PT Next Visit Plan Repeat DHP, roll test, treat for ?right horizontal canal BPPV; MSQ; balance  testing when able   PT Home Exercise Plan Habituation, balance   Consulted and Agree with Plan of Care Patient         Problem List Patient Active Problem List   Diagnosis Date Noted  . Insomnia  chronic on Ativan for years 01/17/2014  . Bradycardia 01/14/2014  . Syncope 01/14/2014  . Gallstone pancreatitis 09/27/2013  . PUD (peptic ulcer disease)  duodenal  2011 09/27/2013  . S/P cholecystectomy 09/27/2013  . Anxiety 08/10/2013  .  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 Depression 08/10/2013  . GERD (gastroesophageal reflux disease) 08/10/2013  . Pancreatitis 08/10/2013  . Menopause 08/10/2013  . Peptic ulcer disease  remote 08/10/2013  WYNN,CYNDI 08/09/2014, 8:24 AM  [facility], [facility] [address] [place], [place], [address] Phone: [phone]   Fax:  [phone]

## 2014-08-09 ENCOUNTER — Telehealth: Payer: Self-pay | Admitting: Internal Medicine

## 2014-08-09 NOTE — Telephone Encounter (Signed)
I spoke with Sharon Cole about her vertigo. She feels that it is worse since going to neurorehab. She is calling them today to see if they can see her again. -eh

## 2014-08-09 NOTE — Telephone Encounter (Signed)
Please give a courtesy call to pt to check on her vertigo.  She should have went to neurorehab yesterday for this  Route back with her response

## 2014-08-17 ENCOUNTER — Other Ambulatory Visit: Payer: Self-pay | Admitting: Otolaryngology

## 2014-08-17 DIAGNOSIS — D497 Neoplasm of unspecified behavior of endocrine glands and other parts of nervous system: Secondary | ICD-10-CM

## 2014-08-17 DIAGNOSIS — H81319 Aural vertigo, unspecified ear: Secondary | ICD-10-CM

## 2014-08-18 ENCOUNTER — Ambulatory Visit (INDEPENDENT_AMBULATORY_CARE_PROVIDER_SITE_OTHER): Payer: BLUE CROSS/BLUE SHIELD | Admitting: Psychology

## 2014-08-18 ENCOUNTER — Ambulatory Visit
Admission: RE | Admit: 2014-08-18 | Discharge: 2014-08-18 | Disposition: A | Discharge status: Home / Self Care | Payer: BLUE CROSS/BLUE SHIELD | Source: Ambulatory Visit | Attending: Otolaryngology | Admitting: Otolaryngology

## 2014-08-18 DIAGNOSIS — F332 Major depressive disorder, recurrent severe without psychotic features: Secondary | ICD-10-CM

## 2014-08-18 DIAGNOSIS — D497 Neoplasm of unspecified behavior of endocrine glands and other parts of nervous system: Secondary | ICD-10-CM

## 2014-08-19 ENCOUNTER — Ambulatory Visit: Payer: BLUE CROSS/BLUE SHIELD | Admitting: Rehabilitative and Restorative Service Providers"

## 2014-08-19 DIAGNOSIS — R269 Unspecified abnormalities of gait and mobility: Secondary | ICD-10-CM

## 2014-08-19 DIAGNOSIS — H8110 Benign paroxysmal vertigo, unspecified ear: Secondary | ICD-10-CM

## 2014-08-19 DIAGNOSIS — R42 Dizziness and giddiness: Secondary | ICD-10-CM | POA: Diagnosis not present

## 2014-08-19 NOTE — Patient Instructions (Addendum)
ONLY DO ROLLING AND SIT<>SIDELYING IF DIZZINESS IS PRESENT. BEGIN ON Sunday, April 17 if dizziness present. THIS IS HOME TREATMENT FOR FUTURE MANAGEMENT OF SYMPTOMS.  Tip Card 1.The goal of habituation training is to assist in decreasing symptoms of vertigo, dizziness, or nausea provoked by specific head and body motions. 2.These exercises may initially increase symptoms; however, be persistent and work through symptoms. With repetition and time, the exercises will assist in reducing or eliminating symptoms. 3.Exercises should be stopped and discussed with the therapist if you experience any of the following: - Sudden change or fluctuation in hearing - New onset of ringing in the ears, or increase in current intensity - Any fluid discharge from the ear - Severe pain in neck or back - Extreme nausea  Copyright  VHI. All rights reserved.  Rolling   With pillow under head, start on back. Roll to your right side.  Hold until dizziness stops, plus 20 seconds and then roll to the left side.  Hold until dizziness stops, plus 20 seconds.  Repeat sequence 5 times per session. Do 2 sessions per day.  Copyright  VHI. All rights reserved.  Sit to Side-Lying   Sit on edge of bed. Lie down onto the right side and hold until dizziness stops, plus 20 seconds.  Return to sitting and wait until dizziness stops, plus 20 seconds.  Repeat to the left side. Repeat sequence 5 times per session. Do 2 sessions per day.  Copyright  VHI. All rights reserved.  Gaze Stabilization: Tip Card 1.Target must remain in focus, not blurry, and appear stationary while head is in motion. 2.Perform exercises with small head movements (45 to either side of midline). 3.Increase speed of head motion so long as target is in focus. 4.If you wear eyeglasses, be sure you can see target through lens (therapist will give specific instructions for bifocal / progressive lenses). 5.These exercises may provoke dizziness or nausea. Work  through these symptoms. If too dizzy, slow head movement slightly. Rest between each exercise. 6.Exercises demand concentration; avoid distractions. 7.For safety, perform standing exercises close to a counter, wall, corner, or next to someone.  Copyright  VHI. All rights reserved.  Gaze Stabilization: Standing Feet Apart   Feet shoulder width apart, keeping eyes on target on wall 3 feet away, tilt head down slightly and move head side to side for 30 seconds. Do 2 sessions per day.   Copyright  VHI. All rights reserved.

## 2014-08-19 NOTE — Therapy (Signed)
[facility] [address] [place], [place], [address] Phone: [phone]   Fax:  [phone]  Physical Therapy Treatment  Patient Details  Name: Sharon Cole MRN: [id number] Date of Birth: [birth year] Referring Provider:  Lanice Shirts, *  Encounter Date: 08/19/2014      PT End of Session - 08/19/14 0838    Visit Number 2   Number of Visits 5   Date for PT Re-Evaluation 09/09/14   PT Start Time 0804   PT Stop Time 0851   PT Time Calculation (min) 47 min   Activity Tolerance Patient tolerated treatment well   Behavior During Therapy [facility] for tasks assessed/performed      Past Medical History  Diagnosis Date  . Anxiety   . Depression   . Menopause   . Bradycardia   . Syncope     Past Surgical History  Procedure Laterality Date  . Breast surgery      breast reduction  . Cholecystectomy    . Knee arthroscopy      There were no vitals filed for this visit.  Visit Diagnosis:  Abnormality of gait  BPPV (benign paroxysmal positional vertigo), unspecified laterality      Subjective Assessment - 08/19/14 0807    Subjective The patient reports that she felt bad on Tuesday and Wednesay after treatment.  She felt symptoms then improved mildly, and then returned over the past 2 days.  She describes symptoms as not spinning, but general imbalance and unsteadiness.     Currently in Pain? No/denies                Vestibular Assessment - 08/19/14 0810    Positional Testing   Dix-Hallpike Dix-Hallpike Right;Dix-Hallpike Left   Sidelying Test Sidelying Right;Sidelying Left   Horizontal Canal Testing Horizontal Canal Right;Horizontal Canal Left   Dix-Hallpike Right   Dix-Hallpike Right Duration --  20 seconds   Dix-Hallpike Right Symptoms No nystagmus  mild sensation of dizziness + nausea   Dix-Hallpike Left   Dix-Hallpike Left Duration --  10 seconds   Dix-Hallpike Left Symptoms Upbeat,  left rotatory nystagmus  with mild dizziness and nausea, resolved quickly   Sidelying Right   Sidelying Right Duration seconds   Sidelying Right Symptoms --  worse with return to sitting described as "waviness"   Sidelying Left   Sidelying Left Duration seconds   Sidelying Left Symptoms --  worse with return to sitting, "waviness"   Horizontal Canal Right   Horizontal Canal Right Duration --  30 seconds   Horizontal Canal Right Symptoms Ageotrophic   Horizontal Canal Left   Horizontal Canal Left Duration --  minimal seconds   Horizontal Canal Left Symptoms Normal             Vestibular Treatment/Exercise - 08/19/14 0827    Vestibular Treatment/Exercise   Vestibular Treatment Provided Canalith Repositioning;Habituation;Gaze   Canalith Repositioning Canal Roll Left  Kim, 2011 procedure for Horiz cupulolithiasis   Habituation Exercises [employer] Daroff;Horizontal Roll   Gaze Exercises X1 Viewing Horizontal;X1 Viewing Vertical   Canal Roll Left   Number of Reps  1   Overall Response  --  provokes nausea, no vomitting and mild dizziness   Nestor Lewandowsky   Number of Reps  --  2   Horizontal Roll   Number of Reps  --  2   X1 Viewing Horizontal   Foot Position standing   Time --  30   Reps 1  X1 Viewing Vertical   Foot Position standing   Time --  30 seconds   Reps 1               PT Education - 08/19/14 1739    Education provided Yes   Education Details HEP: gaze x 1 viewing, horizontal roll, brandt daroff for habituation   Person(s) Educated Patient   Methods Explanation;Demonstration;Handout   Comprehension Returned demonstration;Verbalized understanding             PT Long Term Goals - 08/08/14 0912    PT LONG TERM GOAL #1   Title Patient will demonstrate no further nystagmus in right DHP, and right head rotated position.  09/09/14   Status New   PT LONG TERM GOAL #2   Title Patient will be independent in HEP for balance and habituation for  motion sensitivity.  09/09/14   Status New   PT LONG TERM GOAL #3   Title Patient will report decreased symptoms on FOTO survey with no more than 20% limitation.   09/09/14   Status New   PT LONG TERM GOAL #4   Title Patient will complete SOT and set goal as appropriate for improved sensory use for balance.  09/09/14.   Status New               Plan - 08/19/14 1739    Clinical Impression Statement The patient reports feeling significant improvement today after maneuver.  PT focused teatment on L horizontal cupulolithiasis , as this is most likely the canal contributing ot nausea.  PT to progress habituation, gaze and reassess other canals at next session.   PT Next Visit Plan reassess BPPV, gait/balance assessment, develop HEP   Consulted and Agree with Plan of Care Patient        Problem List Patient Active Problem List   Diagnosis Date Noted  . Insomnia  chronic on Ativan for years 01/17/2014  . Bradycardia 01/14/2014  . Syncope 01/14/2014  . Gallstone pancreatitis 09/27/2013  . PUD (peptic ulcer disease)  duodenal  2011 09/27/2013  . S/P cholecystectomy 09/27/2013  . Anxiety 08/10/2013  .  Biliary calculi recurrent S/p Roux-en-Y [facility] 08/10/2013  . Depression 08/10/2013  . GERD (gastroesophageal reflux disease) 08/10/2013  . Pancreatitis 08/10/2013  . Menopause 08/10/2013  . Peptic ulcer disease  remote 08/10/2013    Adith Tejada, PT 08/19/2014, 5:43 PM  [facility] [address] [place], [place], [address] Phone: [phone]   Fax:  [phone]

## 2014-08-23 ENCOUNTER — Ambulatory Visit (INDEPENDENT_AMBULATORY_CARE_PROVIDER_SITE_OTHER): Payer: BLUE CROSS/BLUE SHIELD | Admitting: Psychology

## 2014-08-23 ENCOUNTER — Ambulatory Visit: Payer: BLUE CROSS/BLUE SHIELD | Admitting: Rehabilitative and Restorative Service Providers"

## 2014-08-23 DIAGNOSIS — F332 Major depressive disorder, recurrent severe without psychotic features: Secondary | ICD-10-CM | POA: Diagnosis not present

## 2014-08-24 ENCOUNTER — Other Ambulatory Visit: Payer: Self-pay | Admitting: Otolaryngology

## 2014-08-24 ENCOUNTER — Other Ambulatory Visit (HOSPITAL_COMMUNITY)
Admission: RE | Admit: 2014-08-24 | Discharge: 2014-08-24 | Disposition: A | Discharge status: Home / Self Care | Payer: BLUE CROSS/BLUE SHIELD | Source: Ambulatory Visit | Attending: Otolaryngology | Admitting: Otolaryngology

## 2014-08-24 ENCOUNTER — Ambulatory Visit: Payer: Self-pay | Admitting: Internal Medicine

## 2014-08-24 DIAGNOSIS — E041 Nontoxic single thyroid nodule: Secondary | ICD-10-CM | POA: Insufficient documentation

## 2014-08-26 ENCOUNTER — Ambulatory Visit: Payer: BLUE CROSS/BLUE SHIELD | Admitting: Psychology

## 2014-08-29 ENCOUNTER — Other Ambulatory Visit: Payer: Self-pay | Admitting: *Deleted

## 2014-08-29 MED ORDER — OMEPRAZOLE 40 MG PO CPDR: 40.0000 mg | DELAYED_RELEASE_CAPSULE | Freq: Two times a day (BID) | ORAL | Status: DC

## 2014-08-29 NOTE — Telephone Encounter (Signed)
Refill request

## 2014-08-30 ENCOUNTER — Telehealth: Payer: Self-pay | Admitting: Rehabilitative and Restorative Service Providers"

## 2014-08-30 ENCOUNTER — Ambulatory Visit: Payer: BLUE CROSS/BLUE SHIELD | Admitting: Rehabilitative and Restorative Service Providers"

## 2014-08-30 ENCOUNTER — Ambulatory Visit (INDEPENDENT_AMBULATORY_CARE_PROVIDER_SITE_OTHER): Payer: BLUE CROSS/BLUE SHIELD | Admitting: Psychology

## 2014-08-30 DIAGNOSIS — F332 Major depressive disorder, recurrent severe without psychotic features: Secondary | ICD-10-CM | POA: Diagnosis not present

## 2014-08-30 NOTE — Telephone Encounter (Signed)
PT left message for patient to return call.  Pt cancelled last visit and no showed today.  Wanted to check on progress of patient and determine if further visits needed.

## 2014-08-31 ENCOUNTER — Encounter: Payer: Self-pay | Admitting: Internal Medicine

## 2014-08-31 NOTE — Progress Notes (Signed)
Subjective:    Patient ID: Sharon Cole, female    DOB: May 06, 1956, 59 y.o.   MRN: [id number]  HPI  08/02/2014 note Assessment & Plan:  Syncope with head injury EKG today no acute change. She has known sinus bradycardia rate 54 today. She is orthostatic on exam. Will send to ER for rehydrations and further eval with Head CT Had cardiology work up with 30 day holter which was unrevealing. She never went for neurology eval  Vertigo may have been exacerbated by fall. Will set up with neurorehab and ENT . She also has chronic tinnitus that has never been evaluated  Sinus bradycardia See cardiology note        TODAY:  Sharon Cole is here for follow up.  Since last visit she has attended neurorehab and has seen Dr. Constance Holster ENT   - see scanned note   Thyroid nodule:  She has had a Thyroid biopsy  R sided nodule felt on ENT exam    Recurrent syncope :  No cardiac etiology to explain recurrent episodes. She has upcomin appt with neurology .  No Active Allergies Past Medical History  Diagnosis Date  . Anxiety   . Depression   . Menopause   . Bradycardia   . Syncope    Past Surgical History  Procedure Laterality Date  . Breast surgery      breast reduction  . Cholecystectomy    . Knee arthroscopy     History   Social History  . Marital Status: Unknown    Spouse Name: N/A  . Number of Children: N/A  . Years of Education: N/A   Occupational History  . Not on file.   Social History Main Topics  . Smoking status: Never Smoker   . Smokeless tobacco: Never Used  . Alcohol Use: 0.0 oz/week    0-2 Glasses of wine per week     Comment: daily  . Drug Use: No  . Sexual Activity: Yes   Other Topics Concern  . Not on file   Social History Narrative   Family History  Problem Relation Age of Onset  . Heart disease Mother   . Heart disease Brother   . Heart disease Maternal Grandmother    Patient Active Problem List   Diagnosis Date Noted  . Insomnia   chronic on Ativan for years 01/17/2014  . Bradycardia 01/14/2014  . Syncope 01/14/2014  . Gallstone pancreatitis 09/27/2013  . PUD (peptic ulcer disease)  duodenal  2011 09/27/2013  . S/P cholecystectomy 09/27/2013  . Anxiety 08/10/2013  .  Biliary calculi recurrent S/p Roux-en-Y [facility] 08/10/2013  . Depression 08/10/2013  . GERD (gastroesophageal reflux disease) 08/10/2013  . Pancreatitis 08/10/2013  . Menopause 08/10/2013  . Peptic ulcer disease  remote 08/10/2013   Current Outpatient Prescriptions on File Prior to Visit  Medication Sig Dispense Refill  . escitalopram (LEXAPRO) 20 MG tablet Take one tablet bid (Patient taking differently: daily. Take one tablet bid) 180 tablet 1  . LORazepam (ATIVAN) 0.5 MG tablet Take 1/2 at bedtime prn 30 tablet 1  . meclizine (ANTIVERT) 25 MG tablet Take one tablet bid prn dizziness 30 tablet 0  . meclizine (ANTIVERT) 25 MG tablet Take 1 tablet (25 mg total) by mouth 4 (four) times daily. 28 tablet 0  . nystatin-triamcinolone ointment (MYCOLOG) Apply 1 application topically 2 (two) times daily. 30 g 0  . omeprazole (PRILOSEC) 40 MG capsule Take 1 capsule (40 mg total) by mouth 2 (two) times daily  before a meal. 180 capsule 2  . promethazine (PHENERGAN) 12.5 MG tablet Take 1 tablet (12.5 mg total) by mouth every 8 (eight) hours as needed for nausea or vomiting. 20 tablet 0   No current facility-administered medications on file prior to visit.      Review of Systems    see HPI Objective:   Physical Exam  Physical Exam  Nursing note and vitals reviewed.  Constitutional: She is oriented to person, place, and time. She appears well-developed and well-nourished.  HENT:  Head: Normocephalic and atraumatic.  Cardiovascular: Normal rate and regular rhythm. Exam reveals no gallop and no friction rub.  No murmur heard.  Pulmonary/Chest: Breath sounds normal. She has no wheezes. She has no rales.  Neurological: She is alert and oriented to person,  place, and time.  Skin: Skin is warm and dry.  Psychiatric: She has a normal mood and affect. Her behavior is normal.        Assessment & Plan:  Recurrent syncope  Vertigo  Thyroid nodule  Managed by ENT  BX done

## 2014-09-04 ENCOUNTER — Telehealth: Payer: Self-pay | Admitting: Internal Medicine

## 2014-09-04 NOTE — Telephone Encounter (Signed)
Left message on mobile for pt to call office and give update regarding her condition and her appt with neurologist

## 2014-09-05 ENCOUNTER — Encounter: Payer: Self-pay | Admitting: Neurology

## 2014-09-05 ENCOUNTER — Ambulatory Visit (INDEPENDENT_AMBULATORY_CARE_PROVIDER_SITE_OTHER): Payer: BLUE CROSS/BLUE SHIELD | Admitting: Neurology

## 2014-09-05 VITALS — BP 92/66 | HR 56 | Resp 16 | Ht 65.0 in | Wt 187.6 lb

## 2014-09-05 DIAGNOSIS — R42 Dizziness and giddiness: Secondary | ICD-10-CM | POA: Diagnosis not present

## 2014-09-05 DIAGNOSIS — R55 Syncope and collapse: Secondary | ICD-10-CM

## 2014-09-05 MED ORDER — TOPIRAMATE 25 MG PO TABS
25.0000 mg | ORAL_TABLET | Freq: Every day | ORAL | Status: DC
Start: 2014-09-05 — End: 2014-11-10

## 2014-09-05 NOTE — Progress Notes (Signed)
NEUROLOGY CONSULTATION NOTE  Sharon Cole MRN: [id number] DOB: 1956/02/23  Referring provider: Dr. Coralyn Mark Primary care provider: Dr. Coralyn Mark  Reason for consult:  Syncope, vertigo  HISTORY OF PRESENT ILLNESS: Sharon Cole is a 59 year old right-handed woman with depression, anxiety and bradycardia who presents for recurrent vertigo and syncope.  Records and CT of head reviewed.    She had first episode of each about 10 years ago.    She has had recurrent syncope.  It usually happens at night.  One time, it occurred while standing in the living room.  She describes a sensation that she will pass out.  She reportedly in unconscious for 1 to 2 minutes.  There is no convulsing or abnormal movements, bowel or bladder incontinence or tongue biting.  There is no post-ictal confusion.  She has longstanding history of low blood pressure with systolic in the 45Y to 099I, as well as bradycardia with heart rate in the 40s-50s bpm.  She has had an extensive cardiac workup.  Holter monitor was unremarkable except for bradycardia, but no event was captured.  Cardiology believes the bradycardia is asymptomatic.  She tells me they don't think the spells are cardiac-related at this time.    She had a CT of the head performed on 08/02/14, which was unremarkable.    She has had prolonged episodes of vertigo and dysequilibrium.  The most current spell started a month ago after one of her falls due to passing out, but they don't usually coincide with each other.  Episodes have lasted anywhere from 3 to 6 weeks.  She describes a constant feeling of motion sickness with periodic episodes of vertigo.  The vertigo is associated with nausea and vomiting and can last 2 days.  She also notes a dull, sometimes throbbing headache involving the temporal, frontal and maxillary regions.  It is exacerbated with head movement.  The vertigo spins either to the right or forward.  There is also associated aural  fullness, pain and pulsatile tinnitus in the left ear.  She saw ENT, who found no abnormalities.  She reports no significant history of migraine, however she says she may have had one or two severe headaches in the distant past.  There is no family history of migraine.  She tried meclizine.  She started vestibular rehab, which made the vertigo worse.  She has longstanding history of anxiety and has been on Ativan 0.5mg  at bedtime for many years.  A few months ago, her Ativan was tapered down to 0.25mg .  PAST MEDICAL HISTORY: Past Medical History  Diagnosis Date  . Anxiety   . Depression   . Menopause   . Bradycardia   . Syncope   . Dizziness     PAST SURGICAL HISTORY: Past Surgical History  Procedure Laterality Date  . Breast surgery      breast reduction  . Cholecystectomy    . Knee arthroscopy      MEDICATIONS: Current Outpatient Prescriptions on File Prior to Visit  Medication Sig Dispense Refill  . escitalopram (LEXAPRO) 20 MG tablet Take one tablet bid (Patient taking differently: daily. Take one tablet bid) 180 tablet 1  . LORazepam (ATIVAN) 0.5 MG tablet Take 1/2 at bedtime prn 30 tablet 1  . meclizine (ANTIVERT) 25 MG tablet Take one tablet bid prn dizziness 30 tablet 0  . meclizine (ANTIVERT) 25 MG tablet Take 1 tablet (25 mg total) by mouth 4 (four) times daily. 28 tablet 0  . nystatin-triamcinolone ointment (  MYCOLOG) Apply 1 application topically 2 (two) times daily. 30 g 0  . omeprazole (PRILOSEC) 40 MG capsule Take 1 capsule (40 mg total) by mouth 2 (two) times daily before a meal. 180 capsule 2  . promethazine (PHENERGAN) 12.5 MG tablet Take 1 tablet (12.5 mg total) by mouth every 8 (eight) hours as needed for nausea or vomiting. 20 tablet 0   No current facility-administered medications on file prior to visit.    ALLERGIES: No Active Allergies  FAMILY HISTORY: Family History  Problem Relation Age of Onset  . Heart disease Mother   . Heart disease Brother     . Heart disease Maternal Grandmother   . Heart disease Maternal Grandfather   . Cancer Son     unknown    SOCIAL HISTORY: History   Social History  . Marital Status: Unknown    Spouse Name: N/A  . Number of Children: N/A  . Years of Education: N/A   Occupational History  . Not on file.   Social History Main Topics  . Smoking status: Never Smoker   . Smokeless tobacco: Never Used  . Alcohol Use: 0.0 oz/week    0-2 Glasses of wine per week     Comment: daily  . Drug Use: No  . Sexual Activity:    Partners: Male   Other Topics Concern  . Not on file   Social History Narrative    REVIEW OF SYSTEMS: Constitutional: No fevers, chills, or sweats, no generalized fatigue, change in appetite Eyes: No visual changes, double vision, eye pain Ear, nose and throat: Dizzy Cardiovascular: No chest pain, palpitations Respiratory:  No shortness of breath at rest or with exertion, wheezes GastrointestinaI: Nausea Genitourinary:  No dysuria, urinary retention or frequency Musculoskeletal:  No neck pain, back pain Integumentary: No rash, pruritus, skin lesions Neurological: as above Psychiatric: No depression, insomnia, anxiety Endocrine: No palpitations, fatigue, diaphoresis, mood swings, change in appetite, change in weight, increased thirst Hematologic/Lymphatic:  No anemia, purpura, petechiae. Allergic/Immunologic: no itchy/runny eyes, nasal congestion, recent allergic reactions, rashes  PHYSICAL EXAM: Filed Vitals:   09/05/14 0819  BP: 92/66  Pulse: 56  Resp: 16   General: No acute distress Head:  Normocephalic/atraumatic Eyes:  fundi unremarkable, without vessel changes, exudates, hemorrhages or papilledema. Neck: supple, no paraspinal tenderness, full range of motion Back: No paraspinal tenderness Heart: regular rate and rhythm Lungs: Clear to auscultation bilaterally. Vascular: No carotid bruits. Neurological Exam: Mental status: alert and oriented to person,  place, and time, recent and remote memory intact, fund of knowledge intact, attention and concentration intact, speech fluent and not dysarthric, language intact. Cranial nerves: CN I: not tested CN II: pupils equal, round and reactive to light, visual fields intact, fundi unremarkable, without vessel changes, exudates, hemorrhages or papilledema. CN III, IV, VI:  full range of motion, no nystagmus, no ptosis CN V: facial sensation intact CN VII: upper and lower face symmetric CN VIII: hearing intact CN IX, X: gag intact, uvula midline CN XI: sternocleidomastoid and trapezius muscles intact CN XII: tongue midline Bulk & Tone: normal, no fasciculations. Motor:  5/5 throughout Sensation:  Temperature and vibration intact Deep Tendon Reflexes:  2+ throughout, toes downgoing Finger to nose testing:  No dysmetria Heel to shin:  No dysmetria Gait:  Normal station and stride.  Able to turn and walk in tandem. Romberg negative.  IMPRESSION: Prolonged episodes of vertigo.  Uncertain etiology.  Vestibular migraine is possible but unusual presentation to be constant lasting for so  long. Syncope.  She does have bradycardia and hypotension, however cardiology doesn't think it is contributing to these spells.  Semiology seems more like syncope rather than seizure, except for being unconscious for two minutes.  PLAN: 1.  To investigate symptoms of clear vertigo (not dizziness) with episodes of syncope, will get MRI of brain with and without contrast and MRA of head.  CT was performed, which poorly demonstrates the posterior fossa. 2.  Will check EEG 3.  Will start topamax 25mg  at bedtime.  Efficacy will be uncertain, however.  If symptoms improve over the next 2 to 4 weeks, it may be because the spell just ran its course.  Side effects discussed. 4.  Informed patient of [place] law, which states that you cannot drive until you have no recurrent spell of unknown/unprovoked loss of consciousness for 6  months. 5.  She will call in 4 weeks with update.  Follow up in 3 months.  Thank you for allowing me to take part in the care of this patient.  Metta Clines, DO  CC: Emi Belfast, MD

## 2014-09-05 NOTE — Patient Instructions (Addendum)
1.  To investigate the passing out, we will get an EEG 2.  We will also get an MRI of the brain with and without contrast and MRA of head [facility]  09/21/14 7:45am  3.  Please be aware that  law states that you cannot drive until you have no recurrent spell of unknown/unprovoked loss of consciousness for 6 months. 4.  Follow up in 3 months.  Call in 4 weeks with update.

## 2014-09-06 ENCOUNTER — Telehealth: Payer: Self-pay | Admitting: *Deleted

## 2014-09-06 ENCOUNTER — Ambulatory Visit (INDEPENDENT_AMBULATORY_CARE_PROVIDER_SITE_OTHER): Payer: BLUE CROSS/BLUE SHIELD | Admitting: Psychology

## 2014-09-06 ENCOUNTER — Ambulatory Visit
Payer: BLUE CROSS/BLUE SHIELD | Attending: Internal Medicine | Admitting: Rehabilitative and Restorative Service Providers"

## 2014-09-06 DIAGNOSIS — H8110 Benign paroxysmal vertigo, unspecified ear: Secondary | ICD-10-CM | POA: Diagnosis not present

## 2014-09-06 DIAGNOSIS — R42 Dizziness and giddiness: Secondary | ICD-10-CM | POA: Insufficient documentation

## 2014-09-06 DIAGNOSIS — R269 Unspecified abnormalities of gait and mobility: Secondary | ICD-10-CM | POA: Diagnosis not present

## 2014-09-06 DIAGNOSIS — F332 Major depressive disorder, recurrent severe without psychotic features: Secondary | ICD-10-CM | POA: Diagnosis not present

## 2014-09-06 NOTE — Therapy (Signed)
[facility] [address] [place], [place], [address] Phone: [phone]   Fax:  [phone]  Physical Therapy Treatment  Patient Details  Name: Sharon Cole MRN: [id number] Date of Birth: [birth year] Referring Provider:  Lanice Shirts, *  Encounter Date: 09/06/2014      PT End of Session - 09/06/14 1400    Visit Number 3   Number of Visits 5   Date for PT Re-Evaluation 09/09/14   PT Start Time 0805   PT Stop Time 0845   PT Time Calculation (min) 40 min   Activity Tolerance Patient tolerated treatment well   Behavior During Therapy [facility] for tasks assessed/performed      Past Medical History  Diagnosis Date  . Anxiety   . Depression   . Menopause   . Bradycardia   . Syncope   . Dizziness     Past Surgical History  Procedure Laterality Date  . Breast surgery      breast reduction  . Cholecystectomy    . Knee arthroscopy      There were no vitals filed for this visit.  Visit Diagnosis:  Dizziness and giddiness  Abnormality of gait  BPPV (benign paroxysmal positional vertigo), unspecified laterality      Subjective Assessment - 09/06/14 0811    Subjective The patient reports symptoms improved after last session.  She still notes nausea with head movements that is not as severe as initial symptoms.  No correlation with time of day.  She reports daily headaches at this time, at times light sensitive.  She notes very rare unsteadiness described as "slight".    Currently in Pain? No/denies          Vestibular Assessment - 09/06/14 0814    Positional Testing   Dix-Hallpike Dix-Hallpike Right;Dix-Hallpike Left   Sidelying Test Sidelying Right;Sidelying Left   Horizontal Canal Testing Horizontal Canal Right;Horizontal Canal Left   Dix-Hallpike Right   Dix-Hallpike Right Duration --  1 minute   Dix-Hallpike Right Symptoms No nystagmus  nausea only   Dix-Hallpike Left   Dix-Hallpike Left  Symptoms No nystagmus  nausea only   Sidelying Right   Sidelying Right Symptoms No nystagmus  nausea 3/10   Sidelying Left   Sidelying Left Duration --  nausea 3/10   Sidelying Left Symptoms No nystagmus  nausea 3/10   Horizontal Canal Right   Horizontal Canal Right Symptoms Normal  nausea 3/10   Horizontal Canal Left   Horizontal Canal Left Symptoms Normal  nausea 3/10                  Vestibular Treatment/Exercise - 09/06/14 0830    Vestibular Treatment/Exercise   Vestibular Treatment Provided Habituation;Gaze   Habituation Exercises Nestor Lewandowsky   Gaze Exercises X1 Viewing Horizontal   Nestor Lewandowsky   Number of Reps  --  2   Symptom Description  --  nausea 3/10   X1 Viewing Horizontal   Foot Position --  seated   Time --  30 seconds   Reps 2   Comments --  pt with progressive lenses               PT Education - 09/06/14 0840    Education provided Yes   Education Details HEP: gaze x 1 viewing, sit<>sidelying for habituation.   Person(s) Educated Patient   Methods Explanation;Demonstration;Handout   Comprehension Returned demonstration;Verbalized understanding             PT  Long Term Goals - 09/06/14 1400    PT LONG TERM GOAL #1   Title Patient will demonstrate no further nystagmus in right DHP, and right head rotated position.  09/09/14   Baseline Met on 09/06/2014   Status Achieved   PT LONG TERM GOAL #2   Title Patient will be independent in HEP for balance and habituation for motion sensitivity.  09/09/14   Status On-going   PT LONG TERM GOAL #3   Title Patient will report decreased symptoms on FOTO survey with no more than 20% limitation.   09/09/14   Status On-going   PT LONG TERM GOAL #4   Title Patient will complete SOT and set goal as appropriate for improved sensory use for balance.  09/09/14.   Status On-going               Plan - 09/06/14 1422    Clinical Impression Statement The patient notes symptoms initially  worse with vestibular rehab after first session and somewhat improved after last session.  She is doing HEP        Problem List Patient Active Problem List   Diagnosis Date Noted  . Insomnia  chronic on Ativan for years 01/17/2014  . Bradycardia 01/14/2014  . Syncope 01/14/2014  . Gallstone pancreatitis 09/27/2013  . PUD (peptic ulcer disease)  duodenal  2011 09/27/2013  . S/P cholecystectomy 09/27/2013  . Anxiety 08/10/2013  .  Biliary calculi recurrent S/p Roux-en-Y [facility] 08/10/2013  . Depression 08/10/2013  . GERD (gastroesophageal reflux disease) 08/10/2013  . Pancreatitis 08/10/2013  . Menopause 08/10/2013  . Peptic ulcer disease  remote 08/10/2013    Addasyn Mcbreen, PT 09/06/2014, 2:26 PM  [facility] [address] [place], [place], [address] Phone: [phone]   Fax:  [phone]

## 2014-09-06 NOTE — Telephone Encounter (Signed)
Sharon Cole returned Dr. Coralyn Mark. She said that she is feeling much better. The dizziness has stopped but she is having some nausea. She is having EEG 09/07/14 and an MRI 09/21/14. She said thank you so much for reaching out to her. -eh

## 2014-09-06 NOTE — Patient Instructions (Signed)
Tip Card 1.The goal of habituation training is to assist in decreasing symptoms of vertigo, dizziness, or nausea provoked by specific head and body motions. 2.These exercises may initially increase symptoms; however, be persistent and work through symptoms. With repetition and time, the exercises will assist in reducing or eliminating symptoms. 3.Exercises should be stopped and discussed with the therapist if you experience any of the following: - Sudden change or fluctuation in hearing - New onset of ringing in the ears, or increase in current intensity - Any fluid discharge from the ear - Severe pain in neck or back - Extreme nausea  Copyright  VHI. All rights reserved.   Sit to Side-Lying   Sit on edge of bed. Lie down onto the right side and hold until dizziness stops, plus 20 seconds.  Return to sitting and wait until dizziness stops, plus 20 seconds.  Repeat to the left side. Repeat sequence 2 times per session. Do 2 sessions per day.  Copyright  VHI. All rights reserved.  Gaze Stabilization: Tip Card 1.Target must remain in focus, not blurry, and appear stationary while head is in motion. 2.Perform exercises with small head movements (45 to either side of midline). 3.Increase speed of head motion so long as target is in focus. 4.If you wear eyeglasses, be sure you can see target through lens (therapist will give specific instructions for bifocal / progressive lenses). 5.These exercises may provoke dizziness or nausea. Work through these symptoms. If too dizzy, slow head movement slightly. Rest between each exercise. 6.Exercises demand concentration; avoid distractions. 7.For safety, perform standing exercises close to a counter, wall, corner, or next to someone.  Copyright  VHI. All rights reserved.  Gaze Stabilization: Standing Feet Apart   Start in seated position.  Progress to standing when able.  Feet shoulder width apart, keeping eyes on target on wall 3 feet away, tilt  head down slightly and move head side to side for 30 seconds. Do 2 sessions per day.   Copyright  VHI. All rights reserved.   Special Instructions: Exercises may bring on mild to moderate symptoms of (dizziness or nausea or headache) that resolve within 30 minutes of completing exercises. If symptoms are lasting longer than 30 minutes, modify your exercises by:  >decreasing the # of times you complete each activity >ensuring your symptoms return to baseline before moving onto the next exercise >dividing up exercises so you do not do them all in one session, but multiple short sessions throughout the day >doing them once a day until symptoms improve

## 2014-09-07 ENCOUNTER — Telehealth: Payer: Self-pay | Admitting: *Deleted

## 2014-09-07 ENCOUNTER — Ambulatory Visit (INDEPENDENT_AMBULATORY_CARE_PROVIDER_SITE_OTHER): Payer: BLUE CROSS/BLUE SHIELD | Admitting: Neurology

## 2014-09-07 DIAGNOSIS — R55 Syncope and collapse: Secondary | ICD-10-CM

## 2014-09-07 DIAGNOSIS — R42 Dizziness and giddiness: Secondary | ICD-10-CM

## 2014-09-07 NOTE — Telephone Encounter (Signed)
-----   Message from Pieter Partridge, DO sent at 09/07/2014  1:27 PM EDT ----- eeg is normal ----- Message -----    From: Pieter Partridge, DO    Sent: 09/07/2014   1:27 PM      To: Pieter Partridge, DO

## 2014-09-07 NOTE — Procedures (Signed)
ELECTROENCEPHALOGRAM REPORT  Date of Study: 09/07/2014  Patient's Name: Sharon Cole MRN: [id number] Date of Birth: 09/10/1955  Indication: Recurrent syncope and vertigo  Medications: Lexapro Ativan Meclizine  Technical Summary: This is a multichannel digital EEG recording, using the international 10-20 placement system.  Spike detection software was employed.  Description: The EEG background is symmetric, with a well-developed posterior dominant rhythm of 9 Hz, which is reactive to eye opening and closing.  Diffuse beta activity is seen, with a bilateral frontal preponderance.  No focal or generalized abnormalities are seen.  No focal or generalized epileptiform discharges are seen.  Stage II sleep is seen, with normal and symmetric sleep patterns.  Hyperventilation and photic stimulation were performed, and produced no abnormalities.  ECG revealed normal cardiac rate and rhythm.  Impression: This is a normal routine EEG of the awake and asleep states, with activating procedures.    Shrika Milos R. Tomi Likens, DO

## 2014-09-07 NOTE — Telephone Encounter (Signed)
Patient is aware of EEG results left on her voice mail

## 2014-09-12 ENCOUNTER — Encounter: Payer: Self-pay | Admitting: Internal Medicine

## 2014-09-13 ENCOUNTER — Ambulatory Visit (INDEPENDENT_AMBULATORY_CARE_PROVIDER_SITE_OTHER): Payer: BLUE CROSS/BLUE SHIELD | Admitting: Psychology

## 2014-09-13 DIAGNOSIS — F332 Major depressive disorder, recurrent severe without psychotic features: Secondary | ICD-10-CM

## 2014-09-14 ENCOUNTER — Telehealth: Payer: Self-pay | Admitting: *Deleted

## 2014-09-14 ENCOUNTER — Telehealth: Payer: Self-pay | Admitting: Internal Medicine

## 2014-09-14 MED ORDER — PANTOPRAZOLE SODIUM 40 MG PO TBEC: 40.0000 mg | DELAYED_RELEASE_TABLET | Freq: Every day | ORAL | Status: DC

## 2014-09-14 MED ORDER — NABUMETONE 500 MG PO TABS: ORAL_TABLET | ORAL | Status: DC

## 2014-09-14 NOTE — Telephone Encounter (Signed)
This doesn't make sense because when I saw her on 09/05/14, she said she was not having headaches and has not had any significant history of headaches.  So this is confusing to me.  However, she was started on Topamax which is used for headaches anyway, so she needs to give this 4 weeks and then reassess.

## 2014-09-14 NOTE — Telephone Encounter (Signed)
Spoke with pt .  Very stressed at work needs to find a new job   Have headaches once a week frontal .  She is on low dose topamax and Ibuprofen helps some  Will give RElafen 500 mg bid for next 48 hours and she is to call and make appt with neurology  MRI is pending  Feels omeprazole not working  She can try Protonix 40 mg daily

## 2014-09-14 NOTE — Telephone Encounter (Signed)
Patient complaining of headache for 3 weeks patient taking Topamax please advise  Call back number [phone]

## 2014-09-14 NOTE — Telephone Encounter (Signed)
Patient states she has had headache for 3 weeks  She is taking Topamax 25 mg  PCP prescribed Relafen to take for headache as well she has had 3 days of severe headache with light sensitivity nausea . She has been on Topamax for 1 week she says .please advise

## 2014-09-15 NOTE — Telephone Encounter (Signed)
I advised patient she needs to give the medication the full 4 weeks to make sure it has a chance to work . She agreed with this .

## 2014-09-20 ENCOUNTER — Ambulatory Visit (INDEPENDENT_AMBULATORY_CARE_PROVIDER_SITE_OTHER): Payer: BLUE CROSS/BLUE SHIELD | Admitting: Psychology

## 2014-09-20 DIAGNOSIS — F332 Major depressive disorder, recurrent severe without psychotic features: Secondary | ICD-10-CM

## 2014-09-21 ENCOUNTER — Ambulatory Visit (HOSPITAL_COMMUNITY)
Admission: RE | Admit: 2014-09-21 | Discharge: 2014-09-21 | Disposition: A | Discharge status: Home / Self Care | Payer: BLUE CROSS/BLUE SHIELD | Source: Ambulatory Visit | Attending: Neurology | Admitting: Neurology

## 2014-09-21 ENCOUNTER — Telehealth: Payer: Self-pay | Admitting: *Deleted

## 2014-09-21 DIAGNOSIS — R930 Abnormal findings on diagnostic imaging of skull and head, not elsewhere classified: Secondary | ICD-10-CM | POA: Diagnosis not present

## 2014-09-21 DIAGNOSIS — R42 Dizziness and giddiness: Secondary | ICD-10-CM | POA: Diagnosis not present

## 2014-09-21 DIAGNOSIS — R55 Syncope and collapse: Secondary | ICD-10-CM

## 2014-09-21 MED ORDER — GADOBENATE DIMEGLUMINE 529 MG/ML IV SOLN
15.0000 mL | Freq: Once | INTRAVENOUS | Status: AC | PRN
  Administered 2014-09-21: 15 mL via INTRAVENOUS

## 2014-09-21 NOTE — Telephone Encounter (Signed)
Patient is aware that MRI and MRA of brain are unremarkable and offer no explanation for dizziness. Incidentally, the MRI does show a single tiny spot of uncertain clinical significance.

## 2014-09-21 NOTE — Telephone Encounter (Signed)
-----   Message from Pieter Partridge, DO sent at 09/21/2014 10:53 AM EDT ----- MRI and MRA of brain are unremarkable and offer no explanation for dizziness.  Incidentally, the MRI does show a single tiny spot of uncertain clinical significance.

## 2014-09-21 NOTE — Telephone Encounter (Signed)
Patient is aware of   Normal  Scans

## 2014-09-23 ENCOUNTER — Telehealth: Payer: Self-pay | Admitting: *Deleted

## 2014-09-23 NOTE — Telephone Encounter (Signed)
Patient has had three headache the past few days that are unmanageable. Please advise Call back number [phone]

## 2014-09-23 NOTE — Telephone Encounter (Signed)
She states for the past 3 days she's been nauseous & sensitive to light. She states she understands that she is supposed to give the Topamax at least 4 weeks, but states she was told to call if pain was really bad. She is also taking Relafen as needed. She states she hasn't taken on otc meds. Please advise.

## 2014-09-23 NOTE — Telephone Encounter (Signed)
She looks like she is prescribed phenergan 12.5mg .  If she has tried this for the nausea, then she can increase dose to 25mg .

## 2014-09-23 NOTE — Telephone Encounter (Signed)
I called patient and explained to her that since she wasn't worked up for headaches at her ov with Dr. Tomi Likens. This seems to be a new onset of "debilitating" headaches since her last ov, when she was asked about  Headaches at that visit she said she didn't have headaches. Dr Tomi Likens recommends that she goes to the ER to be evaluated to make sure there isn't something else going on with her.

## 2014-09-27 ENCOUNTER — Ambulatory Visit (INDEPENDENT_AMBULATORY_CARE_PROVIDER_SITE_OTHER): Payer: BLUE CROSS/BLUE SHIELD | Admitting: Psychology

## 2014-09-27 DIAGNOSIS — F332 Major depressive disorder, recurrent severe without psychotic features: Secondary | ICD-10-CM | POA: Diagnosis not present

## 2014-09-28 ENCOUNTER — Ambulatory Visit: Payer: BLUE CROSS/BLUE SHIELD | Admitting: Rehabilitative and Restorative Service Providers"

## 2014-10-04 ENCOUNTER — Ambulatory Visit (INDEPENDENT_AMBULATORY_CARE_PROVIDER_SITE_OTHER): Payer: BLUE CROSS/BLUE SHIELD | Admitting: Psychology

## 2014-10-04 DIAGNOSIS — F332 Major depressive disorder, recurrent severe without psychotic features: Secondary | ICD-10-CM | POA: Diagnosis not present

## 2014-10-11 ENCOUNTER — Ambulatory Visit: Payer: BLUE CROSS/BLUE SHIELD | Admitting: Psychology

## 2014-10-13 ENCOUNTER — Ambulatory Visit (INDEPENDENT_AMBULATORY_CARE_PROVIDER_SITE_OTHER): Payer: BLUE CROSS/BLUE SHIELD | Admitting: Psychology

## 2014-10-13 DIAGNOSIS — F332 Major depressive disorder, recurrent severe without psychotic features: Secondary | ICD-10-CM

## 2014-10-18 ENCOUNTER — Ambulatory Visit (INDEPENDENT_AMBULATORY_CARE_PROVIDER_SITE_OTHER): Payer: BLUE CROSS/BLUE SHIELD | Admitting: Psychology

## 2014-10-18 DIAGNOSIS — F332 Major depressive disorder, recurrent severe without psychotic features: Secondary | ICD-10-CM | POA: Diagnosis not present

## 2014-10-26 ENCOUNTER — Ambulatory Visit (INDEPENDENT_AMBULATORY_CARE_PROVIDER_SITE_OTHER): Payer: BLUE CROSS/BLUE SHIELD | Admitting: Psychology

## 2014-10-26 DIAGNOSIS — F332 Major depressive disorder, recurrent severe without psychotic features: Secondary | ICD-10-CM

## 2014-11-01 ENCOUNTER — Ambulatory Visit (INDEPENDENT_AMBULATORY_CARE_PROVIDER_SITE_OTHER): Payer: BLUE CROSS/BLUE SHIELD | Admitting: Psychology

## 2014-11-01 DIAGNOSIS — F332 Major depressive disorder, recurrent severe without psychotic features: Secondary | ICD-10-CM | POA: Diagnosis not present

## 2014-11-10 ENCOUNTER — Other Ambulatory Visit: Payer: Self-pay | Admitting: *Deleted

## 2014-11-10 ENCOUNTER — Telehealth: Payer: Self-pay | Admitting: Neurology

## 2014-11-10 MED ORDER — TOPIRAMATE 25 MG PO TABS: 25.0000 mg | ORAL_TABLET | Freq: Every day | ORAL | Status: DC

## 2014-11-10 NOTE — Telephone Encounter (Signed)
Patient called for a refill on Med Topomax? And stated that she is feeling some nausea and motion sickness, her call back PH# is [phone]

## 2014-11-15 ENCOUNTER — Ambulatory Visit (INDEPENDENT_AMBULATORY_CARE_PROVIDER_SITE_OTHER): Payer: BLUE CROSS/BLUE SHIELD | Admitting: Psychology

## 2014-11-15 DIAGNOSIS — F332 Major depressive disorder, recurrent severe without psychotic features: Secondary | ICD-10-CM | POA: Diagnosis not present

## 2014-11-17 NOTE — Telephone Encounter (Signed)
See below - can this be closed / Sherri S.

## 2014-11-22 ENCOUNTER — Ambulatory Visit (INDEPENDENT_AMBULATORY_CARE_PROVIDER_SITE_OTHER): Payer: BLUE CROSS/BLUE SHIELD | Admitting: Psychology

## 2014-11-22 DIAGNOSIS — F332 Major depressive disorder, recurrent severe without psychotic features: Secondary | ICD-10-CM | POA: Diagnosis not present

## 2014-11-24 ENCOUNTER — Telehealth: Payer: Self-pay | Admitting: Internal Medicine

## 2014-11-24 NOTE — Telephone Encounter (Signed)
Patient has an upcoming appointment to establish care with Dr. Doug Sou.  Patients previous MD is out of practice and was not in with a group of mds.  Patient states she was taking escitalopram.  She ran out yesterday of this medication.  I have told patient that Dr. Doug Sou is out of the office.  She would like to know if any of the other MD's would be willing to prescribe her this medication to get her though until her appointment with Dr. Doug Sou on 8/11.

## 2014-11-24 NOTE — Telephone Encounter (Signed)
Please advise, thanks.

## 2014-11-25 MED ORDER — ESCITALOPRAM OXALATE 20 MG PO TABS: 20.0000 mg | ORAL_TABLET | Freq: Every day | ORAL | Status: DC

## 2014-11-25 NOTE — Telephone Encounter (Signed)
Patient states she was taking 20mg  once daily---her appt with dr Doug Sou is august 11th.

## 2014-11-25 NOTE — Telephone Encounter (Signed)
Please confirm with patient the dosage of the Lexapro as the max dose of the medication is generally 20 mg daily.

## 2014-11-25 NOTE — Telephone Encounter (Signed)
Medication sent to pharmacy  

## 2014-11-29 ENCOUNTER — Ambulatory Visit (INDEPENDENT_AMBULATORY_CARE_PROVIDER_SITE_OTHER): Payer: BLUE CROSS/BLUE SHIELD | Admitting: Psychology

## 2014-11-29 DIAGNOSIS — F332 Major depressive disorder, recurrent severe without psychotic features: Secondary | ICD-10-CM

## 2014-12-06 ENCOUNTER — Ambulatory Visit (INDEPENDENT_AMBULATORY_CARE_PROVIDER_SITE_OTHER): Payer: BLUE CROSS/BLUE SHIELD | Admitting: Psychology

## 2014-12-06 DIAGNOSIS — F332 Major depressive disorder, recurrent severe without psychotic features: Secondary | ICD-10-CM | POA: Diagnosis not present

## 2014-12-13 ENCOUNTER — Ambulatory Visit (INDEPENDENT_AMBULATORY_CARE_PROVIDER_SITE_OTHER): Payer: BLUE CROSS/BLUE SHIELD | Admitting: Psychology

## 2014-12-13 DIAGNOSIS — F332 Major depressive disorder, recurrent severe without psychotic features: Secondary | ICD-10-CM

## 2014-12-15 ENCOUNTER — Other Ambulatory Visit: Payer: Self-pay | Admitting: Family

## 2014-12-15 ENCOUNTER — Encounter: Payer: Self-pay | Admitting: Neurology

## 2014-12-15 ENCOUNTER — Ambulatory Visit (INDEPENDENT_AMBULATORY_CARE_PROVIDER_SITE_OTHER): Payer: BLUE CROSS/BLUE SHIELD | Admitting: Neurology

## 2014-12-15 ENCOUNTER — Ambulatory Visit: Payer: Self-pay | Admitting: Internal Medicine

## 2014-12-15 VITALS — BP 108/68 | HR 66 | Resp 16 | Ht 65.5 in | Wt 169.5 lb

## 2014-12-15 DIAGNOSIS — G43009 Migraine without aura, not intractable, without status migrainosus: Secondary | ICD-10-CM

## 2014-12-15 DIAGNOSIS — R42 Dizziness and giddiness: Secondary | ICD-10-CM | POA: Diagnosis not present

## 2014-12-15 DIAGNOSIS — R55 Syncope and collapse: Secondary | ICD-10-CM | POA: Diagnosis not present

## 2014-12-15 NOTE — Progress Notes (Signed)
NEUROLOGY FOLLOW UP OFFICE NOTE  Sharon Cole [id number]  HISTORY OF PRESENT ILLNESS: Sharon Cole is a 59 year old right-handed woman with depression, anxiety and bradycardia who follows up for vertigo and syncope.  EEG and images of MRI and MRA of head were reviewed.  UPDATE:  To investigate syncope, an EEG was performed on 09/07/14, which was normal.  To investigate symptoms of clear vertigo (not dizziness) with episodes of syncope, will get MRI of brain with and without contrast and MRA of head was performed on 09/21/14, which showed incidental 5 mm hyperintensity in the left posterior temporal white matter.  MRA was unremarkable.  To see if the vertigo is migraine-related, she was started on topiramate 25mg .  She was also prescribed Phenergan 25mg  for nausea.  She still gets the vertigo frequently.  She has not had any syncopal episodes but she has felt like she would pass out on several occasions.  She reports "tension" or "sinus" headaches.  In the past, they were associated with nausea.  HISTORY: She had first episode of each about 10 years ago.    She has had recurrent syncope.  It usually happens at night.  One time, it occurred while standing in the living room.  She describes a sensation that she will pass out.  She reportedly in unconscious for 1 to 2 minutes.  There is no convulsing or abnormal movements, bowel or bladder incontinence or tongue biting.  There is no post-ictal confusion.  She has longstanding history of low blood pressure with systolic in the 34K to 876O, as well as bradycardia with heart rate in the 40s-50s bpm.  She has had an extensive cardiac workup.  Holter monitor was unremarkable except for bradycardia, but no event was captured.  Cardiology believes the bradycardia is asymptomatic.  She tells me they don't think the spells are cardiac-related at this time.    She had a CT of the head performed on 08/02/14, which was unremarkable.    She has had prolonged  episodes of vertigo and dysequilibrium.  The most current spell started a month ago after one of her falls due to passing out, but they don't usually coincide with each other.  Episodes have lasted anywhere from 3 to 6 weeks.  She describes a constant feeling of motion sickness with periodic episodes of vertigo.  The vertigo is associated with nausea and vomiting and can last 2 days.  She also notes a dull, sometimes throbbing headache involving the temporal, frontal and maxillary regions. It is exacerbated with head movement.  The vertigo spins either to the right or forward.  There is also associated aural fullness, pain and pulsatile tinnitus in the left ear.  She saw ENT, who found no abnormalities.  She reports no significant history of migraine, however she says she may have had one or two severe headaches in the distant past.  There is no family history of migraine.  She tried meclizine.  She started vestibular rehab, which made the vertigo worse.  She has longstanding history of anxiety and has been on Ativan 0.5mg  at bedtime for many years.  A few months ago, her Ativan was tapered down to 0.25mg .  PAST MEDICAL HISTORY: Past Medical History  Diagnosis Date  . Anxiety   . Depression   . Menopause   . Bradycardia   . Syncope   . Dizziness     MEDICATIONS: Current Outpatient Prescriptions on File Prior to Visit  Medication Sig Dispense Refill  .  escitalopram (LEXAPRO) 20 MG tablet Take 1 tablet (20 mg total) by mouth daily. 21 tablet 0  . LORazepam (ATIVAN) 0.5 MG tablet Take 1/2 at bedtime prn 30 tablet 1  . meclizine (ANTIVERT) 25 MG tablet Take one tablet bid prn dizziness 30 tablet 0  . meclizine (ANTIVERT) 25 MG tablet Take 1 tablet (25 mg total) by mouth 4 (four) times daily. 28 tablet 0  . nabumetone (RELAFEN) 500 MG tablet Take one tablet q 12 hours for the next 2 days then bid prn headache 30 tablet 1  . nystatin-triamcinolone ointment (MYCOLOG) Apply 1 application topically 2  (two) times daily. 30 g 0  . pantoprazole (PROTONIX) 40 MG tablet Take 1 tablet (40 mg total) by mouth daily. (Patient not taking: Reported on 12/15/2014) 30 tablet 3  . promethazine (PHENERGAN) 12.5 MG tablet Take 1 tablet (12.5 mg total) by mouth every 8 (eight) hours as needed for nausea or vomiting. 20 tablet 0   No current facility-administered medications on file prior to visit.    ALLERGIES: No Active Allergies  FAMILY HISTORY: Family History  Problem Relation Age of Onset  . Heart disease Mother   . Heart disease Brother   . Heart disease Maternal Grandmother   . Heart disease Maternal Grandfather   . Cancer Son     unknown    SOCIAL HISTORY: Social History   Social History  . Marital Status: Single    Spouse Name: N/A  . Number of Children: N/A  . Years of Education: N/A   Occupational History  . Not on file.   Social History Main Topics  . Smoking status: Never Smoker   . Smokeless tobacco: Never Used  . Alcohol Use: 0.0 oz/week    0-2 Glasses of wine per week     Comment: daily  . Drug Use: No  . Sexual Activity:    Partners: Male   Other Topics Concern  . Not on file   Social History Narrative    REVIEW OF SYSTEMS: Constitutional: No fevers, chills, or sweats, no generalized fatigue, change in appetite Eyes: No visual changes, double vision, eye pain Ear, nose and throat: No hearing loss, ear pain, nasal congestion, sore throat Cardiovascular: No chest pain, palpitations Respiratory:  No shortness of breath at rest or with exertion, wheezes GastrointestinaI: No nausea, vomiting, diarrhea, abdominal pain, fecal incontinence Genitourinary:  No dysuria, urinary retention or frequency Musculoskeletal:  No neck pain, back pain Integumentary: No rash, pruritus, skin lesions Neurological: as above Psychiatric: No depression, insomnia, anxiety Endocrine: No palpitations, fatigue, diaphoresis, mood swings, change in appetite, change in weight, increased  thirst Hematologic/Lymphatic:  No anemia, purpura, petechiae. Allergic/Immunologic: no itchy/runny eyes, nasal congestion, recent allergic reactions, rashes  PHYSICAL EXAM: Filed Vitals:   12/15/14 0800  BP: 108/68  Pulse: 66  Resp: 16   General: No acute distress.  Patient appears well-groomed.  Head:  Normocephalic/atraumatic Eyes:  Fundoscopic exam unremarkable without vessel changes, exudates, hemorrhages or papilledema. Neck: supple, no paraspinal tenderness, full range of motion Heart:  Regular rate and rhythm Lungs:  Clear to auscultation bilaterally Back: No paraspinal tenderness Neurological Exam: alert and oriented to person, place, and time. Attention span and concentration intact, recent and remote memory intact, fund of knowledge intact.  Speech fluent and not dysarthric, language intact.  CN II-XII intact. Fundoscopic exam unremarkable without vessel changes, exudates, hemorrhages or papilledema.  Bulk and tone normal, muscle strength 5/5 throughout.  Sensation to light touch, temperature and vibration intact.  Deep tendon reflexes 2+ throughout, toes downgoing.  Finger to nose and heel to shin testing intact.  Gait normal, Romberg negative.  IMPRESSION: Vertigo.  I don't suspect it is migraine as it is fairly frequent and occurs most days than not. Syncope and lightheadedness.  She has had recorded bradycardia and low blood pressure in the past.  Consider dysautonomia Her history of "sinus" headaches may actually be migraine  PLAN: 1.  We will check a Tilt Table test to verify dysautonomia 2.  She wishes to discontinue topamax for now and monitor headaches with a headache diary 3.  Excedrin and phenergan for headache and headache-associated nausea 4.  Follow up after testing  Metta Clines, DO  CC:  Vertell Novak, MD

## 2014-12-15 NOTE — Patient Instructions (Signed)
1.  Send you for tilt table test 2.  Stop topamax 3.  Keep headache diary 4.  Take Excedrin for headaches (limited to less than 2 days out of the week)

## 2014-12-20 ENCOUNTER — Ambulatory Visit: Payer: BLUE CROSS/BLUE SHIELD | Admitting: Psychology

## 2014-12-27 ENCOUNTER — Ambulatory Visit (INDEPENDENT_AMBULATORY_CARE_PROVIDER_SITE_OTHER): Payer: BLUE CROSS/BLUE SHIELD | Admitting: Psychology

## 2014-12-27 DIAGNOSIS — F332 Major depressive disorder, recurrent severe without psychotic features: Secondary | ICD-10-CM | POA: Diagnosis not present

## 2015-01-02 ENCOUNTER — Other Ambulatory Visit (INDEPENDENT_AMBULATORY_CARE_PROVIDER_SITE_OTHER): Payer: BLUE CROSS/BLUE SHIELD

## 2015-01-02 ENCOUNTER — Ambulatory Visit (INDEPENDENT_AMBULATORY_CARE_PROVIDER_SITE_OTHER): Payer: BLUE CROSS/BLUE SHIELD | Admitting: Internal Medicine

## 2015-01-02 ENCOUNTER — Encounter: Payer: Self-pay | Admitting: Internal Medicine

## 2015-01-02 VITALS — BP 88/62 | HR 62 | Temp 98.0°F | Resp 12 | Ht 65.5 in | Wt 175.4 lb

## 2015-01-02 DIAGNOSIS — F329 Major depressive disorder, single episode, unspecified: Secondary | ICD-10-CM

## 2015-01-02 DIAGNOSIS — F419 Anxiety disorder, unspecified: Secondary | ICD-10-CM

## 2015-01-02 DIAGNOSIS — R42 Dizziness and giddiness: Secondary | ICD-10-CM

## 2015-01-02 DIAGNOSIS — K219 Gastro-esophageal reflux disease without esophagitis: Secondary | ICD-10-CM | POA: Diagnosis not present

## 2015-01-02 DIAGNOSIS — F32A Depression, unspecified: Secondary | ICD-10-CM

## 2015-01-02 DIAGNOSIS — Z0000 Encounter for general adult medical examination without abnormal findings: Secondary | ICD-10-CM | POA: Diagnosis not present

## 2015-01-02 DIAGNOSIS — Z23 Encounter for immunization: Secondary | ICD-10-CM | POA: Diagnosis not present

## 2015-01-02 LAB — CBC
HCT: 40.6 % (ref 36.0–46.0)
Hemoglobin: 13.3 g/dL (ref 12.0–15.0)
MCHC: 32.7 g/dL (ref 30.0–36.0)
MCV: 84.6 fl (ref 78.0–100.0)
Platelets: 218 10*3/uL (ref 150.0–400.0)
RBC: 4.8 Mil/uL (ref 3.87–5.11)
RDW: 15.3 % (ref 11.5–15.5)
WBC: 7.7 10*3/uL (ref 4.0–10.5)

## 2015-01-02 LAB — LIPID PANEL
Cholesterol: 168 mg/dL (ref 0–200)
HDL: 53.7 mg/dL (ref >39.00)
LDL Cholesterol: 96 mg/dL (ref 0–99)
NonHDL: 113.87
Total CHOL/HDL Ratio: 3
Triglycerides: 91 mg/dL (ref 0.0–149.0)
VLDL: 18.2 mg/dL (ref 0.0–40.0)

## 2015-01-02 LAB — VITAMIN D 25 HYDROXY (VIT D DEFICIENCY, FRACTURES): VITD: 37.24 ng/mL (ref 30.00–100.00)

## 2015-01-02 LAB — COMPREHENSIVE METABOLIC PANEL
ALT: 36 U/L — ABNORMAL HIGH (ref 0–35)
AST: 29 U/L (ref 0–37)
Albumin: 3.8 g/dL (ref 3.5–5.2)
Alkaline Phosphatase: 91 U/L (ref 39–117)
BUN: 14 mg/dL (ref 6–23)
CO2: 30 mEq/L (ref 19–32)
Calcium: 9.2 mg/dL (ref 8.4–10.5)
Chloride: 107 mEq/L (ref 96–112)
Creatinine, Ser: 0.73 mg/dL (ref 0.40–1.20)
GFR: 86.97 mL/min (ref >60.00)
Glucose, Bld: 86 mg/dL (ref 70–99)
Potassium: 4.2 mEq/L (ref 3.5–5.1)
Sodium: 143 mEq/L (ref 135–145)
Total Bilirubin: 0.3 mg/dL (ref 0.2–1.2)
Total Protein: 6.4 g/dL (ref 6.0–8.3)

## 2015-01-02 LAB — TSH: TSH: 1.04 u[IU]/mL (ref 0.35–4.50)

## 2015-01-02 NOTE — Progress Notes (Signed)
Pre visit review using our clinic review tool, if applicable. No additional management support is needed unless otherwise documented below in the visit note. 

## 2015-01-02 NOTE — Assessment & Plan Note (Signed)
Takes ativan at night time for sleep and lexapro for mood which is very effective at this time.

## 2015-01-02 NOTE — Patient Instructions (Signed)
We will check all your blood work today and call you back with the results. We will call you even if everything is normal.  Come back in about 6-12 months for a check up. If you have any problems sooner please feel free to call the office.   Health Maintenance Adopting a healthy lifestyle and getting preventive care can go a long way to promote health and wellness. Talk with your health care provider about what schedule of regular examinations is right for you. This is a good chance for you to check in with your provider about disease prevention and staying healthy. In between checkups, there are plenty of things you can do on your own. Experts have done a lot of research about which lifestyle changes and preventive measures are most likely to keep you healthy. Ask your health care provider for more information. WEIGHT AND DIET  Eat a healthy diet  Be sure to include plenty of vegetables, fruits, low-fat dairy products, and lean protein.  Do not eat a lot of foods high in solid fats, added sugars, or salt.  Get regular exercise. This is one of the most important things you can do for your health.  Most adults should exercise for at least 150 minutes each week. The exercise should increase your heart rate and make you sweat (moderate-intensity exercise).  Most adults should also do strengthening exercises at least twice a week. This is in addition to the moderate-intensity exercise.  Maintain a healthy weight  Body mass index (BMI) is a measurement that can be used to identify possible weight problems. It estimates body fat based on height and weight. Your health care provider can help determine your BMI and help you achieve or maintain a healthy weight.  For females 64 years of age and older:   A BMI below 18.5 is considered underweight.  A BMI of 18.5 to 24.9 is normal.  A BMI of 25 to 29.9 is considered overweight.  A BMI of 30 and above is considered obese.  Watch levels of  cholesterol and blood lipids  You should start having your blood tested for lipids and cholesterol at 58 years of age, then have this test every 5 years.  You may need to have your cholesterol levels checked more often if:  Your lipid or cholesterol levels are high.  You are older than 58 years of age.  You are at high risk for heart disease.  CANCER SCREENING   Lung Cancer  Lung cancer screening is recommended for adults 53-46 years old who are at high risk for lung cancer because of a history of smoking.  A yearly low-dose CT scan of the lungs is recommended for people who:  Currently smoke.  Have quit within the past 15 years.  Have at least a 30-pack-year history of smoking. A pack year is smoking an average of one pack of cigarettes a day for 1 year.  Yearly screening should continue until it has been 15 years since you quit.  Yearly screening should stop if you develop a health problem that would prevent you from having lung cancer treatment.  Breast Cancer  Practice breast self-awareness. This means understanding how your breasts normally appear and feel.  It also means doing regular breast self-exams. Let your health care provider know about any changes, no matter how small.  If you are in your 20s or 30s, you should have a clinical breast exam (CBE) by a health care provider every 1-3 years as  part of a regular health exam.  If you are 40 or older, have a CBE every year. Also consider having a breast X-ray (mammogram) every year.  If you have a family history of breast cancer, talk to your health care provider about genetic screening.  If you are at high risk for breast cancer, talk to your health care provider about having an MRI and a mammogram every year.  Breast cancer gene (BRCA) assessment is recommended for women who have family members with BRCA-related cancers. BRCA-related cancers include:  Breast.  Ovarian.  Tubal.  Peritoneal  cancers.  Results of the assessment will determine the need for genetic counseling and BRCA1 and BRCA2 testing. Cervical Cancer Routine pelvic examinations to screen for cervical cancer are no longer recommended for nonpregnant women who are considered low risk for cancer of the pelvic organs (ovaries, uterus, and vagina) and who do not have symptoms. A pelvic examination may be necessary if you have symptoms including those associated with pelvic infections. Ask your health care provider if a screening pelvic exam is right for you.   The Pap test is the screening test for cervical cancer for women who are considered at risk.  If you had a hysterectomy for a problem that was not cancer or a condition that could lead to cancer, then you no longer need Pap tests.  If you are older than 65 years, and you have had normal Pap tests for the past 10 years, you no longer need to have Pap tests.  If you have had past treatment for cervical cancer or a condition that could lead to cancer, you need Pap tests and screening for cancer for at least 20 years after your treatment.  If you no longer get a Pap test, assess your risk factors if they change (such as having a new sexual partner). This can affect whether you should start being screened again.  Some women have medical problems that increase their chance of getting cervical cancer. If this is the case for you, your health care provider may recommend more frequent screening and Pap tests.  The human papillomavirus (HPV) test is another test that may be used for cervical cancer screening. The HPV test looks for the virus that can cause cell changes in the cervix. The cells collected during the Pap test can be tested for HPV.  The HPV test can be used to screen women 72 years of age and older. Getting tested for HPV can extend the interval between normal Pap tests from three to five years.  An HPV test also should be used to screen women of any age who  have unclear Pap test results.  After 58 years of age, women should have HPV testing as often as Pap tests.  Colorectal Cancer  This type of cancer can be detected and often prevented.  Routine colorectal cancer screening usually begins at 58 years of age and continues through 58 years of age.  Your health care provider may recommend screening at an earlier age if you have risk factors for colon cancer.  Your health care provider may also recommend using home test kits to check for hidden blood in the stool.  A small camera at the end of a tube can be used to examine your colon directly (sigmoidoscopy or colonoscopy). This is done to check for the earliest forms of colorectal cancer.  Routine screening usually begins at age 61.  Direct examination of the colon should be repeated every 5-10  years through 58 years of age. However, you may need to be screened more often if early forms of precancerous polyps or small growths are found. Skin Cancer  Check your skin from head to toe regularly.  Tell your health care provider about any new moles or changes in moles, especially if there is a change in a mole's shape or color.  Also tell your health care provider if you have a mole that is larger than the size of a pencil eraser.  Always use sunscreen. Apply sunscreen liberally and repeatedly throughout the day.  Protect yourself by wearing long sleeves, pants, a wide-brimmed hat, and sunglasses whenever you are outside. HEART DISEASE, DIABETES, AND HIGH BLOOD PRESSURE   Have your blood pressure checked at least every 1-2 years. High blood pressure causes heart disease and increases the risk of stroke.  If you are between 39 years and 49 years old, ask your health care provider if you should take aspirin to prevent strokes.  Have regular diabetes screenings. This involves taking a blood sample to check your fasting blood sugar level.  If you are at a normal weight and have a low risk for  diabetes, have this test once every three years after 58 years of age.  If you are overweight and have a high risk for diabetes, consider being tested at a younger age or more often. PREVENTING INFECTION  Hepatitis B  If you have a higher risk for hepatitis B, you should be screened for this virus. You are considered at high risk for hepatitis B if:  You were born in a country where hepatitis B is common. Ask your health care provider which countries are considered high risk.  Your parents were born in a high-risk country, and you have not been immunized against hepatitis B (hepatitis B vaccine).  You have HIV or AIDS.  You use needles to inject street drugs.  You live with someone who has hepatitis B.  You have had sex with someone who has hepatitis B.  You get hemodialysis treatment.  You take certain medicines for conditions, including cancer, organ transplantation, and autoimmune conditions. Hepatitis C  Blood testing is recommended for:  Everyone born from [birth year] through 1964.  Anyone with known risk factors for hepatitis C. Sexually transmitted infections (STIs)  You should be screened for sexually transmitted infections (STIs) including gonorrhea and chlamydia if:  You are sexually active and are younger than 58 years of age.  You are older than 58 years of age and your health care provider tells you that you are at risk for this type of infection.  Your sexual activity has changed since you were last screened and you are at an increased risk for chlamydia or gonorrhea. Ask your health care provider if you are at risk.  If you do not have HIV, but are at risk, it may be recommended that you take a prescription medicine daily to prevent HIV infection. This is called pre-exposure prophylaxis (PrEP). You are considered at risk if:  You are sexually active and do not regularly use condoms or know the HIV status of your partner(s).  You take drugs by injection.  You are  sexually active with a partner who has HIV. Talk with your health care provider about whether you are at high risk of being infected with HIV. If you choose to begin PrEP, you should first be tested for HIV. You should then be tested every 3 months for as long as you are  taking PrEP.  PREGNANCY   If you are premenopausal and you may become pregnant, ask your health care provider about preconception counseling.  If you may become pregnant, take 400 to 800 micrograms (mcg) of folic acid every day.  If you want to prevent pregnancy, talk to your health care provider about birth control (contraception). OSTEOPOROSIS AND MENOPAUSE   Osteoporosis is a disease in which the bones lose minerals and strength with aging. This can result in serious bone fractures. Your risk for osteoporosis can be identified using a bone density scan.  If you are 70 years of age or older, or if you are at risk for osteoporosis and fractures, ask your health care provider if you should be screened.  Ask your health care provider whether you should take a calcium or vitamin D supplement to lower your risk for osteoporosis.  Menopause may have certain physical symptoms and risks.  Hormone replacement therapy may reduce some of these symptoms and risks. Talk to your health care provider about whether hormone replacement therapy is right for you.  HOME CARE INSTRUCTIONS   Schedule regular health, dental, and eye exams.  Stay current with your immunizations.   Do not use any tobacco products including cigarettes, chewing tobacco, or electronic cigarettes.  If you are pregnant, do not drink alcohol.  If you are breastfeeding, limit how much and how often you drink alcohol.  Limit alcohol intake to no more than 1 drink per day for nonpregnant women. One drink equals 12 ounces of beer, 5 ounces of wine, or 1 ounces of hard liquor.  Do not use street drugs.  Do not share needles.  Ask your health care provider for  help if you need support or information about quitting drugs.  Tell your health care provider if you often feel depressed.  Tell your health care provider if you have ever been abused or do not feel safe at home. Document Released: 11/05/2010 Document Revised: 09/06/2013 Document Reviewed: 03/24/2013 [facility] Patient Information 2015 [place], [place]. This information is not intended to replace advice given to you by your health care provider. Make sure you discuss any questions you have with your health care provider.

## 2015-01-02 NOTE — Progress Notes (Signed)
   Subjective:    Patient ID: Sharon Cole, female    DOB: July 30, 1955, 59 y.o.   MRN: [id number]  HPI The patient is a 59 YO female who is coming in new for wellness. Has no new complaints but does have some chronic concerns she would like reviewed. Denies any concerns.   PMH, [facility], social history reviewed and updated.   Review of Systems  Constitutional: Negative for fever, activity change, appetite change, fatigue and unexpected weight change.  HENT: Negative.   Eyes: Negative.   Respiratory: Negative for cough, chest tightness, shortness of breath and wheezing.   Cardiovascular: Negative for chest pain, palpitations and leg swelling.  Gastrointestinal: Negative for abdominal pain, diarrhea, constipation, blood in stool and abdominal distention.  Musculoskeletal: Negative for myalgias, back pain, arthralgias and neck pain.  Skin: Negative.   Neurological: Negative.   Psychiatric/Behavioral: Negative.       Objective:   Physical Exam  Constitutional: She is oriented to person, place, and time. She appears well-developed and well-nourished.  HENT:  Head: Normocephalic and atraumatic.  Right Ear: External ear normal.  Left Ear: External ear normal.  Eyes: EOM are normal.  Neck: Normal range of motion.  Cardiovascular: Normal rate and regular rhythm.   Pulmonary/Chest: Effort normal and breath sounds normal. No respiratory distress. She has no wheezes. She has no rales.  Abdominal: Soft. Bowel sounds are normal. She exhibits no distension. There is no tenderness. There is no rebound.  Musculoskeletal: She exhibits no edema.  Neurological: She is alert and oriented to person, place, and time. Coordination normal.  Skin: Skin is warm and dry.  Psychiatric: She has a normal mood and affect.   Filed Vitals:   01/02/15 1109  BP: 88/62  Pulse: 62  Temp: 98 F (36.7 C)  TempSrc: Oral  Resp: 12  Height: 5' 5.5" (1.664 m)  Weight: 175 lb 6.4 oz (79.561 kg)  SpO2: 97%        Assessment & Plan:  Tdap given at visit.

## 2015-01-02 NOTE — Assessment & Plan Note (Signed)
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

## 2015-01-02 NOTE — Assessment & Plan Note (Signed)
Takes prilosec daily with occasional breakthrough symptoms.

## 2015-01-02 NOTE — Assessment & Plan Note (Signed)
Does get episodes occasional with motion. Uses meclizine or phenergan for symptoms.

## 2015-01-02 NOTE — Assessment & Plan Note (Signed)
Tdap given at visit today. Colonoscopy due in 2023, flu shot yearly at her work. Mammogram up to date. Talked with her about sun protection and sunscreen for decreasing risk of skin cancer.

## 2015-01-03 ENCOUNTER — Ambulatory Visit (INDEPENDENT_AMBULATORY_CARE_PROVIDER_SITE_OTHER): Payer: BLUE CROSS/BLUE SHIELD | Admitting: Psychology

## 2015-01-03 DIAGNOSIS — F332 Major depressive disorder, recurrent severe without psychotic features: Secondary | ICD-10-CM | POA: Diagnosis not present

## 2015-01-10 ENCOUNTER — Ambulatory Visit: Payer: BLUE CROSS/BLUE SHIELD | Admitting: Psychology

## 2015-01-17 ENCOUNTER — Telehealth: Payer: Self-pay | Admitting: Internal Medicine

## 2015-01-17 ENCOUNTER — Ambulatory Visit (INDEPENDENT_AMBULATORY_CARE_PROVIDER_SITE_OTHER): Payer: BLUE CROSS/BLUE SHIELD | Admitting: Psychology

## 2015-01-17 DIAGNOSIS — Z760 Encounter for issue of repeat prescription: Secondary | ICD-10-CM

## 2015-01-17 DIAGNOSIS — F332 Major depressive disorder, recurrent severe without psychotic features: Secondary | ICD-10-CM

## 2015-01-17 NOTE — Telephone Encounter (Signed)
Patient states that she forgot to mention that she needs these meds to [facility] [address]:  - lorazepam 0.5 MG/ take BID  - escitalopram 30 MG/ take 2 per day

## 2015-01-18 MED ORDER — LORAZEPAM 0.5 MG PO TABS: ORAL_TABLET | ORAL | Status: DC

## 2015-01-18 MED ORDER — ESCITALOPRAM OXALATE 20 MG PO TABS: ORAL_TABLET | ORAL | Status: DC

## 2015-01-18 NOTE — Telephone Encounter (Signed)
Notified pt both rx's has been sent to [facility].../lmb

## 2015-01-18 NOTE — Telephone Encounter (Signed)
Printed and signed. Please fax.

## 2015-01-18 NOTE — Telephone Encounter (Signed)
Sent refill on lexapro pls advise on Lorazepam.../lmb

## 2015-01-24 ENCOUNTER — Ambulatory Visit (INDEPENDENT_AMBULATORY_CARE_PROVIDER_SITE_OTHER): Payer: BLUE CROSS/BLUE SHIELD | Admitting: Psychology

## 2015-01-24 DIAGNOSIS — F332 Major depressive disorder, recurrent severe without psychotic features: Secondary | ICD-10-CM | POA: Diagnosis not present

## 2015-01-31 ENCOUNTER — Ambulatory Visit: Payer: BLUE CROSS/BLUE SHIELD | Admitting: Psychology

## 2015-02-01 ENCOUNTER — Ambulatory Visit (INDEPENDENT_AMBULATORY_CARE_PROVIDER_SITE_OTHER): Payer: BLUE CROSS/BLUE SHIELD | Admitting: Psychology

## 2015-02-01 DIAGNOSIS — F332 Major depressive disorder, recurrent severe without psychotic features: Secondary | ICD-10-CM

## 2015-02-07 ENCOUNTER — Ambulatory Visit: Payer: BLUE CROSS/BLUE SHIELD | Admitting: Psychology

## 2015-02-08 ENCOUNTER — Ambulatory Visit (INDEPENDENT_AMBULATORY_CARE_PROVIDER_SITE_OTHER): Payer: BLUE CROSS/BLUE SHIELD | Admitting: Psychology

## 2015-02-08 DIAGNOSIS — F332 Major depressive disorder, recurrent severe without psychotic features: Secondary | ICD-10-CM | POA: Diagnosis not present

## 2015-02-13 ENCOUNTER — Other Ambulatory Visit (INDEPENDENT_AMBULATORY_CARE_PROVIDER_SITE_OTHER): Payer: BLUE CROSS/BLUE SHIELD

## 2015-02-13 ENCOUNTER — Encounter: Payer: Self-pay | Admitting: Internal Medicine

## 2015-02-13 ENCOUNTER — Ambulatory Visit (INDEPENDENT_AMBULATORY_CARE_PROVIDER_SITE_OTHER): Payer: BLUE CROSS/BLUE SHIELD | Admitting: Internal Medicine

## 2015-02-13 VITALS — BP 102/70 | HR 53 | Temp 97.8°F | Wt 180.0 lb

## 2015-02-13 DIAGNOSIS — R1013 Epigastric pain: Secondary | ICD-10-CM | POA: Diagnosis not present

## 2015-02-13 DIAGNOSIS — K2100 Gastro-esophageal reflux disease with esophagitis, without bleeding: Secondary | ICD-10-CM

## 2015-02-13 LAB — URINALYSIS
Bilirubin Urine: NEGATIVE
Hgb urine dipstick: NEGATIVE
Ketones, ur: NEGATIVE
Leukocytes, UA: NEGATIVE
Nitrite: NEGATIVE
Specific Gravity, Urine: <=1.005 — AB (ref 1.000–1.030)
Total Protein, Urine: NEGATIVE
Urine Glucose: NEGATIVE
Urobilinogen, UA: 0.2 (ref 0.0–1.0)
pH: 6.5 (ref 5.0–8.0)

## 2015-02-13 LAB — CBC WITH DIFFERENTIAL/PLATELET
Basophils Absolute: 0 10*3/uL (ref 0.0–0.1)
Basophils Relative: 0.5 % (ref 0.0–3.0)
Eosinophils Absolute: 0.9 10*3/uL — ABNORMAL HIGH (ref 0.0–0.7)
Eosinophils Relative: 10.6 % — ABNORMAL HIGH (ref 0.0–5.0)
HCT: 42.4 % (ref 36.0–46.0)
Hemoglobin: 13.9 g/dL (ref 12.0–15.0)
Lymphocytes Relative: 32.5 % (ref 12.0–46.0)
Lymphs Abs: 2.7 10*3/uL (ref 0.7–4.0)
MCHC: 32.7 g/dL (ref 30.0–36.0)
MCV: 85.6 fl (ref 78.0–100.0)
Monocytes Absolute: 0.5 10*3/uL (ref 0.1–1.0)
Monocytes Relative: 6.1 % (ref 3.0–12.0)
Neutro Abs: 4.2 10*3/uL (ref 1.4–7.7)
Neutrophils Relative %: 50.3 % (ref 43.0–77.0)
Platelets: 191 10*3/uL (ref 150.0–400.0)
RBC: 4.96 Mil/uL (ref 3.87–5.11)
RDW: 15.4 % (ref 11.5–15.5)
WBC: 8.4 10*3/uL (ref 4.0–10.5)

## 2015-02-13 LAB — HEPATIC FUNCTION PANEL
ALT: 42 U/L — ABNORMAL HIGH (ref 0–35)
AST: 29 U/L (ref 0–37)
Albumin: 4.1 g/dL (ref 3.5–5.2)
Alkaline Phosphatase: 105 U/L (ref 39–117)
Bilirubin, Direct: 0 mg/dL (ref 0.0–0.3)
Total Bilirubin: 0.3 mg/dL (ref 0.2–1.2)
Total Protein: 6.6 g/dL (ref 6.0–8.3)

## 2015-02-13 LAB — BASIC METABOLIC PANEL
BUN: 10 mg/dL (ref 6–23)
CO2: 32 mEq/L (ref 19–32)
Calcium: 9.4 mg/dL (ref 8.4–10.5)
Chloride: 104 mEq/L (ref 96–112)
Creatinine, Ser: 0.58 mg/dL (ref 0.40–1.20)
GFR: 113.36 mL/min (ref >60.00)
Glucose, Bld: 89 mg/dL (ref 70–99)
Potassium: 4 mEq/L (ref 3.5–5.1)
Sodium: 141 mEq/L (ref 135–145)

## 2015-02-13 LAB — LIPASE: Lipase: 50 U/L (ref 11.0–59.0)

## 2015-02-13 LAB — SEDIMENTATION RATE: Sed Rate: 8 mm/hr (ref 0–22)

## 2015-02-13 LAB — H. PYLORI ANTIBODY, IGG: H Pylori IgG: NEGATIVE

## 2015-02-13 MED ORDER — OMEPRAZOLE 40 MG PO CPDR: DELAYED_RELEASE_CAPSULE | ORAL | Status: DC

## 2015-02-13 MED ORDER — TRAMADOL HCL 50 MG PO TABS: 50.0000 mg | ORAL_TABLET | Freq: Two times a day (BID) | ORAL | Status: DC | PRN

## 2015-02-13 NOTE — Patient Instructions (Signed)
Go to ER if worse 

## 2015-02-13 NOTE — Progress Notes (Signed)
Subjective:  Patient ID: Sharon Cole, female    DOB: 1956-04-15  Age: 59 y.o. MRN: [id number]  CC: Abdominal Pain and Nausea   HPI ICELYN NAVARRETE presents for epig pain x 3 d- 7/10 days. Pt stopped Omeprazole 2 mo ago. C/o nausea. Vomited once on Sat. C/o constipation. Wine 1-2 glasses at night  Outpatient Prescriptions Prior to Visit  Medication Sig Dispense Refill  . escitalopram (LEXAPRO) 20 MG tablet TAKE 1 TABLET (20 MG TOTAL) BY MOUTH DAILY. 90 tablet 3  . LORazepam (ATIVAN) 0.5 MG tablet Take 1/2 at bedtime prn 15 tablet 3  . meclizine (ANTIVERT) 25 MG tablet Take one tablet bid prn dizziness 30 tablet 0  . nabumetone (RELAFEN) 500 MG tablet Take one tablet q 12 hours for the next 2 days then bid prn headache 30 tablet 1  . nystatin-triamcinolone ointment (MYCOLOG) Apply 1 application topically 2 (two) times daily. 30 g 0  . promethazine (PHENERGAN) 12.5 MG tablet Take 1 tablet (12.5 mg total) by mouth every 8 (eight) hours as needed for nausea or vomiting. 20 tablet 0  . omeprazole (PRILOSEC) 40 MG capsule TAKE 1 CAPSULE (40 MG TOTAL) BY MOUTH 2 (TWO) TIMES DAILY BEFORE A MEAL.  2   No facility-administered medications prior to visit.    ROS Review of Systems  Constitutional: Negative for chills, activity change, appetite change, fatigue and unexpected weight change.  HENT: Negative for congestion, mouth sores and sinus pressure.   Eyes: Negative for visual disturbance.  Respiratory: Negative for cough and chest tightness.   Gastrointestinal: Positive for nausea, abdominal pain and constipation. Negative for diarrhea and abdominal distention.  Genitourinary: Negative for frequency, difficulty urinating and vaginal pain.  Musculoskeletal: Negative for back pain and gait problem.  Skin: Negative for pallor and rash.  Neurological: Negative for dizziness, tremors, weakness, numbness and headaches.  Psychiatric/Behavioral: Negative for confusion and sleep disturbance.      Objective:  BP 102/70 mmHg  Pulse 53  Temp(Src) 97.8 F (36.6 C) (Oral)  Wt 180 lb (81.647 kg)  SpO2 96%  BP Readings from Last 3 Encounters:  02/13/15 102/70  01/02/15 88/62  12/15/14 108/68    Wt Readings from Last 3 Encounters:  02/13/15 180 lb (81.647 kg)  01/02/15 175 lb 6.4 oz (79.561 kg)  12/15/14 169 lb 8 oz (76.885 kg)    Physical Exam  Constitutional: She appears well-developed. No distress.  HENT:  Head: Normocephalic.  Right Ear: External ear normal.  Left Ear: External ear normal.  Nose: Nose normal.  Mouth/Throat: Oropharynx is clear and moist.  Eyes: Conjunctivae are normal. Pupils are equal, round, and reactive to light. Right eye exhibits no discharge. Left eye exhibits no discharge.  Neck: Normal range of motion. Neck supple. No JVD present. No tracheal deviation present. No thyromegaly present.  Cardiovascular: Normal rate, regular rhythm and normal heart sounds.   Pulmonary/Chest: No stridor. No respiratory distress. She has no wheezes.  Abdominal: Soft. Bowel sounds are normal. She exhibits no distension and no mass. There is tenderness. There is no rebound and no guarding.  Musculoskeletal: She exhibits no edema or tenderness.  Lymphadenopathy:    She has no cervical adenopathy.  Neurological: She displays normal reflexes. No cranial nerve deficit. She exhibits normal muscle tone. Coordination normal.  Skin: No rash noted. No erythema.  Psychiatric: She has a normal mood and affect. Her behavior is normal. Judgment and thought content normal.  No HSM Epig are is tender  Lab  Results  Component Value Date   WBC 8.4 02/13/2015   HGB 13.9 02/13/2015   HCT 42.4 02/13/2015   PLT 191.0 02/13/2015   GLUCOSE 89 02/13/2015   CHOL 168 01/02/2015   TRIG 91.0 01/02/2015   HDL 53.70 01/02/2015   LDLCALC 96 01/02/2015   ALT 42* 02/13/2015   AST 29 02/13/2015   NA 141 02/13/2015   K 4.0 02/13/2015   CL 104 02/13/2015   CREATININE 0.58 02/13/2015    BUN 10 02/13/2015   CO2 32 02/13/2015   TSH 1.04 01/02/2015    Mr Mra Head Wo Contrast  09/21/2014   CLINICAL DATA:  Syncope.  Vertigo  EXAM: MRI HEAD WITHOUT AND WITH CONTRAST  MRA HEAD WITHOUT AND WITH CONTRAST  TECHNIQUE: Multiplanar, multiecho pulse sequences of the brain and surrounding structures were obtained without and with intravenous contrast. Angiographic images of the head were obtained using MRA technique without and with contrast.  CONTRAST:  34mL MULTIHANCE GADOBENATE DIMEGLUMINE 529 MG/ML IV SOLN  COMPARISON:  CT head 08/02/2014  FINDINGS: MRI HEAD FINDINGS  Ventricle size is normal.  Cerebral volume is normal.  Pituitary normal in size. Craniocervical junction normal. Normal appearing orbit. Calvarium intact.  Negative for acute infarct. Small hyperintensity left posterior temporal white matter measuring 5 mm. Probable chronic ischemia.  Brainstem and cerebellum normal in signal.  Basal ganglia normal.  Negative for intracranial hemorrhage.  Negative for mass or edema.  Normal enhancement following contrast infusion. No enhancing mass lesion.  MRA HEAD FINDINGS  Both vertebral arteries are patent to the basilar. PICA patent bilaterally. Basilar widely patent. Fetal origin left posterior cerebral artery. Posterior cerebral arteries are patent bilaterally. Superior cerebellar arteries patent bilaterally.  Internal carotid artery normal. Anterior and middle cerebral arteries normal bilaterally. No stenosis or branch occlusion  Negative for cerebral aneurysm.  IMPRESSION: 5 mm hyperintensity left posterior temporal white matter most likely chronic ischemia. Otherwise normal MRI of the brain with contrast  Normal MRA head   Electronically Signed   By: Franchot Gallo M.D.   On: 09/21/2014 10:07   Mr Jeri Cos JJ Contrast  09/21/2014   CLINICAL DATA:  Syncope.  Vertigo  EXAM: MRI HEAD WITHOUT AND WITH CONTRAST  MRA HEAD WITHOUT AND WITH CONTRAST  TECHNIQUE: Multiplanar, multiecho pulse sequences  of the brain and surrounding structures were obtained without and with intravenous contrast. Angiographic images of the head were obtained using MRA technique without and with contrast.  CONTRAST:  50mL MULTIHANCE GADOBENATE DIMEGLUMINE 529 MG/ML IV SOLN  COMPARISON:  CT head 08/02/2014  FINDINGS: MRI HEAD FINDINGS  Ventricle size is normal.  Cerebral volume is normal.  Pituitary normal in size. Craniocervical junction normal. Normal appearing orbit. Calvarium intact.  Negative for acute infarct. Small hyperintensity left posterior temporal white matter measuring 5 mm. Probable chronic ischemia.  Brainstem and cerebellum normal in signal.  Basal ganglia normal.  Negative for intracranial hemorrhage.  Negative for mass or edema.  Normal enhancement following contrast infusion. No enhancing mass lesion.  MRA HEAD FINDINGS  Both vertebral arteries are patent to the basilar. PICA patent bilaterally. Basilar widely patent. Fetal origin left posterior cerebral artery. Posterior cerebral arteries are patent bilaterally. Superior cerebellar arteries patent bilaterally.  Internal carotid artery normal. Anterior and middle cerebral arteries normal bilaterally. No stenosis or branch occlusion  Negative for cerebral aneurysm.  IMPRESSION: 5 mm hyperintensity left posterior temporal white matter most likely chronic ischemia. Otherwise normal MRI of the brain with contrast  Normal  MRA head   Electronically Signed   By: Franchot Gallo M.D.   On: 09/21/2014 10:07    Assessment & Plan:   Shanele was seen today for abdominal pain and nausea.  Diagnoses and all orders for this visit:  Abdominal pain, epigastric -     Basic metabolic panel; Future -     CBC with Differential/Platelet; Future -     Hepatic function panel; Future -     Sedimentation rate; Future -     Urinalysis; Future -     Lipase; Future -     H. pylori antibody, IgG; Future  Other orders -     omeprazole (PRILOSEC) 40 MG capsule; TAKE 1 CAPSULE (40  MG TOTAL) BY MOUTH 2 (TWO) TIMES DAILY BEFORE A MEAL. -     traMADol (ULTRAM) 50 MG tablet; Take 1-2 tablets (50-100 mg total) by mouth 2 (two) times daily as needed.   I am having Ms. Hibbitts start on traMADol. I am also having her maintain her nystatin-triamcinolone ointment, meclizine, promethazine, nabumetone, escitalopram, LORazepam, and omeprazole.  Meds ordered this encounter  Medications  . omeprazole (PRILOSEC) 40 MG capsule    Sig: TAKE 1 CAPSULE (40 MG TOTAL) BY MOUTH 2 (TWO) TIMES DAILY BEFORE A MEAL.    Dispense:  60 capsule    Refill:  11  . traMADol (ULTRAM) 50 MG tablet    Sig: Take 1-2 tablets (50-100 mg total) by mouth 2 (two) times daily as needed.    Dispense:  60 tablet    Refill:  0     Follow-up: Return in about 3 days (around 02/16/2015) for a follow-up visit.  Walker Kehr, MD

## 2015-02-13 NOTE — Assessment & Plan Note (Addendum)
10/16: x3days - acute ?etiology - probable gastritis/PUD (pt stopped PPI 2 mo ago) Labs

## 2015-02-13 NOTE — Progress Notes (Signed)
Pre visit review using our clinic review tool, if applicable. No additional management support is needed unless otherwise documented below in the visit note. 

## 2015-02-14 NOTE — Assessment & Plan Note (Signed)
Restart PPI

## 2015-02-15 ENCOUNTER — Ambulatory Visit: Payer: Self-pay | Admitting: Neurology

## 2015-02-15 ENCOUNTER — Ambulatory Visit: Payer: BLUE CROSS/BLUE SHIELD | Admitting: Internal Medicine

## 2015-02-17 ENCOUNTER — Ambulatory Visit (INDEPENDENT_AMBULATORY_CARE_PROVIDER_SITE_OTHER): Payer: BLUE CROSS/BLUE SHIELD | Admitting: Internal Medicine

## 2015-02-17 ENCOUNTER — Encounter: Payer: Self-pay | Admitting: Internal Medicine

## 2015-02-17 VITALS — BP 100/70 | HR 64 | Temp 98.6°F | Resp 12 | Ht 65.5 in | Wt 180.0 lb

## 2015-02-17 DIAGNOSIS — R1013 Epigastric pain: Secondary | ICD-10-CM | POA: Diagnosis not present

## 2015-02-17 NOTE — Progress Notes (Signed)
   Subjective:    Patient ID: Sharon Cole, female    DOB: 08-03-1955, 59 y.o.   MRN: [id number]  HPI The patient is a 59 YO female coming in for follow up of her epigastric pain. 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 She started having pain about 1 week ago, seen Monday and started back on prilosec BID (she had stopped herself several months before). Since getting back on the medicine she is doing better but still having some pain yesterday. She is concerned that there are more stones as she has been bloated and swollen since the pain started. No diarrhea or blood in her stools.   Review of Systems  Constitutional: Negative for fever, activity change, appetite change, fatigue and unexpected weight change.  Respiratory: Negative for cough, chest tightness, shortness of breath and wheezing.   Cardiovascular: Negative for chest pain, palpitations and leg swelling.  Gastrointestinal: Positive for abdominal pain and abdominal distention. Negative for vomiting, diarrhea, constipation and blood in stool.  Musculoskeletal: Negative for myalgias, back pain, arthralgias and neck pain.  Skin: Negative.   Neurological: Negative.   Psychiatric/Behavioral: Negative.       Objective:   Physical Exam  Constitutional: She is oriented to person, place, and time. She appears well-developed and well-nourished.  HENT:  Head: Normocephalic and atraumatic.  Right Ear: External ear normal.  Left Ear: External ear normal.  Eyes: EOM are normal.  Neck: Normal range of motion.  Cardiovascular: Normal rate and regular rhythm.   Pulmonary/Chest: Effort normal and breath sounds normal. No respiratory distress. She has no wheezes. She has no rales.  Abdominal: Soft. Bowel sounds are normal. There is no rebound.  Minimal bloating with tenderness top of the abdomen especially  in the epigastric region.   Musculoskeletal: She exhibits no edema.  Neurological: She is alert and oriented to person, place, and time. Coordination normal.  Skin: Skin is warm and dry.  Psychiatric: She has a normal mood and affect.   Filed Vitals:   02/17/15 0809  BP: 100/70  Pulse: 64  Temp: 98.6 F (37 C)  TempSrc: Oral  Resp: 12  Height: 5' 5.5" (1.664 m)  Weight: 180 lb (81.647 kg)  SpO2: 96%      Assessment & Plan:

## 2015-02-17 NOTE — Patient Instructions (Signed)
We are going to get an ultrasound of the stomach to see what could be causing the pain.   Continue to take the omeprazole twice a day for the next week then go to once daily with it. If the pain continues to be present we may need to send you back to a stomach doctor for further investigation.   Food Choices for Gastroesophageal Reflux Disease, Adult When you have gastroesophageal reflux disease (GERD), the foods you eat and your eating habits are very important. Choosing the right foods can help ease the discomfort of GERD. WHAT GENERAL GUIDELINES DO I NEED TO FOLLOW?  Choose fruits, vegetables, whole grains, low-fat dairy products, and low-fat meat, fish, and poultry.  Limit fats such as oils, salad dressings, butter, nuts, and avocado.  Keep a food diary to identify foods that cause symptoms.  Avoid foods that cause reflux. These may be different for different people.  Eat frequent small meals instead of three large meals each day.  Eat your meals slowly, in a relaxed setting.  Limit fried foods.  Cook foods using methods other than frying.  Avoid drinking alcohol.  Avoid drinking large amounts of liquids with your meals.  Avoid bending over or lying down until 2-3 hours after eating. WHAT FOODS ARE NOT RECOMMENDED? The following are some foods and drinks that may worsen your symptoms: Vegetables Tomatoes. Tomato juice. Tomato and spaghetti sauce. Chili peppers. Onion and garlic. Horseradish. Fruits Oranges, grapefruit, and lemon (fruit and juice). Meats High-fat meats, fish, and poultry. This includes hot dogs, ribs, ham, sausage, salami, and bacon. Dairy Whole milk and chocolate milk. Sour cream. Cream. Butter. Ice cream. Cream cheese.  Beverages Coffee and tea, with or without caffeine. Carbonated beverages or energy drinks. Condiments Hot sauce. Barbecue sauce.  Sweets/Desserts Chocolate and cocoa. Donuts. Peppermint and spearmint. Fats and Oils High-fat foods,  including [place] fries and potato chips. Other Vinegar. Strong spices, such as black pepper, white pepper, red pepper, cayenne, curry powder, cloves, ginger, and chili powder. The items listed above may not be a complete list of foods and beverages to avoid. Contact your dietitian for more information.   This information is not intended to replace advice given to you by your health care provider. Make sure you discuss any questions you have with your health care provider.   Document Released: 04/22/2005 Document Revised: 05/13/2014 Document Reviewed: 02/24/2013 Elsevier Interactive Patient Education [employer].

## 2015-02-17 NOTE — Assessment & Plan Note (Signed)
Improved with her PPI BID but not gone. H Pylori negative IgG. Ordered abdominal US to evaluate for more stones. If no resolution with BID dosing will refer to GI for further investigation of symptoms given her complicated PMH.

## 2015-02-17 NOTE — Progress Notes (Signed)
Pre visit review using our clinic review tool, if applicable. No additional management support is needed unless otherwise documented below in the visit note. 

## 2015-02-20 ENCOUNTER — Ambulatory Visit
Admission: RE | Admit: 2015-02-20 | Discharge: 2015-02-20 | Disposition: A | Discharge status: Home / Self Care | Payer: BLUE CROSS/BLUE SHIELD | Source: Ambulatory Visit | Attending: Internal Medicine | Admitting: Internal Medicine

## 2015-02-20 DIAGNOSIS — R1013 Epigastric pain: Secondary | ICD-10-CM

## 2015-02-21 ENCOUNTER — Ambulatory Visit (INDEPENDENT_AMBULATORY_CARE_PROVIDER_SITE_OTHER): Payer: BLUE CROSS/BLUE SHIELD | Admitting: Psychology

## 2015-02-21 DIAGNOSIS — F332 Major depressive disorder, recurrent severe without psychotic features: Secondary | ICD-10-CM | POA: Diagnosis not present

## 2015-02-28 ENCOUNTER — Ambulatory Visit (INDEPENDENT_AMBULATORY_CARE_PROVIDER_SITE_OTHER): Payer: BLUE CROSS/BLUE SHIELD | Admitting: Psychology

## 2015-02-28 DIAGNOSIS — F332 Major depressive disorder, recurrent severe without psychotic features: Secondary | ICD-10-CM | POA: Diagnosis not present

## 2015-03-07 ENCOUNTER — Ambulatory Visit (INDEPENDENT_AMBULATORY_CARE_PROVIDER_SITE_OTHER): Payer: BLUE CROSS/BLUE SHIELD | Admitting: Psychology

## 2015-03-07 DIAGNOSIS — F332 Major depressive disorder, recurrent severe without psychotic features: Secondary | ICD-10-CM

## 2015-03-08 ENCOUNTER — Telehealth: Payer: Self-pay | Admitting: *Deleted

## 2015-03-08 NOTE — Telephone Encounter (Signed)
Left msg on triage stating thst she has been having some increase anxiety issues. Seeing a psychiatrist and she had to increase her Citalopram to 30 mg, and Lorazepam to .50 mg. Wanting to get updated script sent to pharmacy...Johny Chess

## 2015-03-09 MED ORDER — ESCITALOPRAM OXALATE 20 MG PO TABS: ORAL_TABLET | ORAL | Status: DC

## 2015-03-09 NOTE — Addendum Note (Signed)
Addended by: Earnstine Regal on: 03/09/2015 09:50 AM   Modules accepted: Orders

## 2015-03-09 NOTE — Telephone Encounter (Signed)
Notified pt with md response. Made appt for Monday 03/13/15. She also need a refill on her Lexapro enough until she see md.../lmb

## 2015-03-09 NOTE — Telephone Encounter (Signed)
If she is seeing the psychiatrist who is changing doses they need to prescribe. If she needs medications changes she would need a visit with me if she is wanting me to change.

## 2015-03-13 ENCOUNTER — Ambulatory Visit: Payer: BLUE CROSS/BLUE SHIELD | Admitting: Internal Medicine

## 2015-03-13 ENCOUNTER — Encounter: Payer: Self-pay | Admitting: Internal Medicine

## 2015-03-13 VITALS — BP 118/76 | HR 56 | Temp 97.8°F | Resp 18 | Ht 65.0 in | Wt 173.2 lb

## 2015-03-13 DIAGNOSIS — Z760 Encounter for issue of repeat prescription: Secondary | ICD-10-CM

## 2015-03-13 DIAGNOSIS — R1013 Epigastric pain: Secondary | ICD-10-CM | POA: Diagnosis not present

## 2015-03-13 DIAGNOSIS — F329 Major depressive disorder, single episode, unspecified: Secondary | ICD-10-CM | POA: Diagnosis not present

## 2015-03-13 DIAGNOSIS — F419 Anxiety disorder, unspecified: Secondary | ICD-10-CM | POA: Diagnosis not present

## 2015-03-13 DIAGNOSIS — F32A Depression, unspecified: Secondary | ICD-10-CM

## 2015-03-13 MED ORDER — LORAZEPAM 0.5 MG PO TABS: 0.5000 mg | ORAL_TABLET | Freq: Every day | ORAL | Status: DC

## 2015-03-13 MED ORDER — ESCITALOPRAM OXALATE 10 MG PO TABS
30.0000 mg | ORAL_TABLET | Freq: Every day | ORAL | Status: DC
Start: 2015-03-13 — End: 2015-08-24

## 2015-03-13 NOTE — Patient Instructions (Signed)
We have sent in the new prescription for the lexapro 10 mg pills. Until you are out of the 20 mg pills you can take 1 of both (10 mg + 20 mg daily for 30 mg). When they are gone take 3 of the 10 mg pills daily.   We have also increased the ativan while you are in this stressful time.

## 2015-03-13 NOTE — Progress Notes (Signed)
Pre visit review using our clinic review tool, if applicable. No additional management support is needed unless otherwise documented below in the visit note. 

## 2015-03-14 ENCOUNTER — Ambulatory Visit (INDEPENDENT_AMBULATORY_CARE_PROVIDER_SITE_OTHER): Payer: BLUE CROSS/BLUE SHIELD | Admitting: Psychology

## 2015-03-14 DIAGNOSIS — F332 Major depressive disorder, recurrent severe without psychotic features: Secondary | ICD-10-CM

## 2015-03-15 NOTE — Assessment & Plan Note (Signed)
Worsened at this time and temporarily increase ativan to 0.5 mg daily prn while increased lexapro dose taking effect.

## 2015-03-15 NOTE — Assessment & Plan Note (Signed)
Increase lexapro to 30 mg daily and she will check back in with how it is working.

## 2015-03-15 NOTE — Progress Notes (Signed)
   Subjective:    Patient ID: Sharon Cole, female    DOB: [birth year], 59 y.o.   MRN: [id number]  HPI The patient is a 59 YO female coming in for worsening mood and anxiety. This does seem to happen sometimes and she is usually able to increase her lexapro and it helps more. She is having some new stressors in her life. Denies SI/HI. Not exercising. Still taking her lexapro. Denies side effects.  Review of Systems  Constitutional: Negative.   Respiratory: Negative.   Cardiovascular: Negative.   Gastrointestinal: Negative.   Musculoskeletal: Negative.   Psychiatric/Behavioral: Positive for sleep disturbance and dysphoric mood. Negative for suicidal ideas, behavioral problems, confusion, self-injury, decreased concentration and agitation. The patient is nervous/anxious.       Objective:   Physical Exam  Constitutional: She is oriented to person, place, and time. She appears well-developed and well-nourished.  HENT:  Head: Normocephalic and atraumatic.  Right Ear: External ear normal.  Left Ear: External ear normal.  Eyes: EOM are normal.  Neck: Normal range of motion.  Cardiovascular: Normal rate and regular rhythm.   Pulmonary/Chest: Effort normal and breath sounds normal. No respiratory distress. She has no wheezes. She has no rales.  Abdominal: Soft. She exhibits no distension. There is no tenderness. There is no rebound.  Musculoskeletal: She exhibits no edema.  Neurological: She is alert and oriented to person, place, and time. Coordination normal.  Skin: Skin is warm and dry.  Psychiatric: She has a normal mood and affect.   Filed Vitals:   03/13/15 1325  BP: 118/76  Pulse: 56  Temp: 97.8 F (36.6 C)  TempSrc: Oral  Resp: 18  Height: 5\' 5"  (1.651 m)  Weight: 173 lb 4 oz (78.586 kg)  SpO2: 96%      Assessment & Plan:

## 2015-03-15 NOTE — Assessment & Plan Note (Signed)
Resolved for now 

## 2015-03-20 ENCOUNTER — Encounter: Payer: Self-pay | Admitting: Rehabilitative and Restorative Service Providers"

## 2015-03-20 NOTE — Therapy (Signed)
[facility] [address] [place], [place], [address] Phone: [phone]   Fax:  [phone]  Patient Details  Name: Sharon Cole MRN: [id number] Date of Birth: 1955-12-30 Referring Provider:  No ref. provider found  Encounter Date: last encounter 09/06/2014  PHYSICAL THERAPY DISCHARGE SUMMARY  Visits from Start of Care: 3  Current functional level related to goals / functional outcomes:       PT Long Term Goals - 09/06/14 1400    PT LONG TERM GOAL #1   Title Patient will demonstrate no further nystagmus in right DHP, and right head rotated position.  09/09/14   Baseline Met on 09/06/2014   Status Achieved   PT LONG TERM GOAL #2   Title Patient will be independent in HEP for balance and habituation for motion sensitivity.  09/09/14   Status On-going   PT LONG TERM GOAL #3   Title Patient will report decreased symptoms on FOTO survey with no more than 20% limitation.   09/09/14   Status On-going   PT LONG TERM GOAL #4   Title Patient will complete SOT and set goal as appropriate for improved sensory use for balance.  09/09/14.   Status On-going        Remaining deficits: The patient did not return for PT cancelling remaining visits.  Status unknown at time of d/c.   Education / Equipment: HEP.  Plan: Patient agrees to discharge.  Patient goals were not met. Patient is being discharged due to not returning since the last visit.  ?????       Thank you for the referral of this patient. Rudell Cobb, MPT    Elanda Garmany 03/20/2015, 2:05 PM  [facility] [address] [place], [place], [address] Phone: [phone]   Fax:  [phone]

## 2015-03-21 ENCOUNTER — Ambulatory Visit (INDEPENDENT_AMBULATORY_CARE_PROVIDER_SITE_OTHER): Payer: BLUE CROSS/BLUE SHIELD | Admitting: Psychology

## 2015-03-21 DIAGNOSIS — F332 Major depressive disorder, recurrent severe without psychotic features: Secondary | ICD-10-CM

## 2015-03-28 ENCOUNTER — Ambulatory Visit (INDEPENDENT_AMBULATORY_CARE_PROVIDER_SITE_OTHER): Payer: BLUE CROSS/BLUE SHIELD | Admitting: Psychology

## 2015-03-28 DIAGNOSIS — F332 Major depressive disorder, recurrent severe without psychotic features: Secondary | ICD-10-CM | POA: Diagnosis not present

## 2015-04-04 ENCOUNTER — Ambulatory Visit (INDEPENDENT_AMBULATORY_CARE_PROVIDER_SITE_OTHER): Payer: BLUE CROSS/BLUE SHIELD | Admitting: Psychology

## 2015-04-04 DIAGNOSIS — F332 Major depressive disorder, recurrent severe without psychotic features: Secondary | ICD-10-CM | POA: Diagnosis not present

## 2015-04-11 ENCOUNTER — Ambulatory Visit (INDEPENDENT_AMBULATORY_CARE_PROVIDER_SITE_OTHER): Payer: BLUE CROSS/BLUE SHIELD | Admitting: Psychology

## 2015-04-11 DIAGNOSIS — F332 Major depressive disorder, recurrent severe without psychotic features: Secondary | ICD-10-CM

## 2015-04-18 ENCOUNTER — Ambulatory Visit (INDEPENDENT_AMBULATORY_CARE_PROVIDER_SITE_OTHER): Payer: BLUE CROSS/BLUE SHIELD | Admitting: Psychology

## 2015-04-18 DIAGNOSIS — F332 Major depressive disorder, recurrent severe without psychotic features: Secondary | ICD-10-CM | POA: Diagnosis not present

## 2015-04-25 ENCOUNTER — Ambulatory Visit: Payer: BLUE CROSS/BLUE SHIELD | Admitting: Psychology

## 2015-05-02 ENCOUNTER — Ambulatory Visit (INDEPENDENT_AMBULATORY_CARE_PROVIDER_SITE_OTHER): Payer: BLUE CROSS/BLUE SHIELD | Admitting: Psychology

## 2015-05-02 DIAGNOSIS — F332 Major depressive disorder, recurrent severe without psychotic features: Secondary | ICD-10-CM | POA: Diagnosis not present

## 2015-05-09 ENCOUNTER — Ambulatory Visit (INDEPENDENT_AMBULATORY_CARE_PROVIDER_SITE_OTHER): Payer: BLUE CROSS/BLUE SHIELD | Admitting: Psychology

## 2015-05-09 DIAGNOSIS — F332 Major depressive disorder, recurrent severe without psychotic features: Secondary | ICD-10-CM

## 2015-05-16 ENCOUNTER — Ambulatory Visit (INDEPENDENT_AMBULATORY_CARE_PROVIDER_SITE_OTHER): Payer: BLUE CROSS/BLUE SHIELD | Admitting: Psychology

## 2015-05-16 DIAGNOSIS — F332 Major depressive disorder, recurrent severe without psychotic features: Secondary | ICD-10-CM

## 2015-05-23 ENCOUNTER — Ambulatory Visit: Payer: BLUE CROSS/BLUE SHIELD | Admitting: Psychology

## 2015-05-30 ENCOUNTER — Ambulatory Visit: Payer: BLUE CROSS/BLUE SHIELD | Admitting: Psychology

## 2015-06-06 ENCOUNTER — Ambulatory Visit (INDEPENDENT_AMBULATORY_CARE_PROVIDER_SITE_OTHER): Payer: BLUE CROSS/BLUE SHIELD | Admitting: Psychology

## 2015-06-06 DIAGNOSIS — F332 Major depressive disorder, recurrent severe without psychotic features: Secondary | ICD-10-CM | POA: Diagnosis not present

## 2015-06-13 ENCOUNTER — Ambulatory Visit: Payer: BLUE CROSS/BLUE SHIELD | Admitting: Psychology

## 2015-06-26 ENCOUNTER — Telehealth: Payer: Self-pay | Admitting: *Deleted

## 2015-06-26 NOTE — Telephone Encounter (Signed)
Received call from pharmacist stating they received a transfer from [facility] in [place] needing to clarify strength & direction on pt Lexapro. On med list its stated to take Leaxapro 10 mg take 3 tablets daily then it states take 20 mg daily. Which once is correct,,,/lmb

## 2015-06-26 NOTE — Telephone Encounter (Signed)
Called pt to verify dosage no answer LMOM RTC...Sharon Cole

## 2015-06-26 NOTE — Telephone Encounter (Signed)
Please clarify to 20-30 mg daily as needed.

## 2015-06-27 ENCOUNTER — Ambulatory Visit (INDEPENDENT_AMBULATORY_CARE_PROVIDER_SITE_OTHER): Payer: BLUE CROSS/BLUE SHIELD | Admitting: Psychology

## 2015-06-27 DIAGNOSIS — F332 Major depressive disorder, recurrent severe without psychotic features: Secondary | ICD-10-CM | POA: Diagnosis not present

## 2015-06-27 NOTE — Telephone Encounter (Signed)
Called pt no answer LMOM RTC ASAP.../lmb 

## 2015-06-28 NOTE — Telephone Encounter (Signed)
Tried calling pt back again still no answer. She has not return call bck. Closing phone note...Johny Chess

## 2015-07-04 ENCOUNTER — Ambulatory Visit (INDEPENDENT_AMBULATORY_CARE_PROVIDER_SITE_OTHER): Payer: BLUE CROSS/BLUE SHIELD | Admitting: Psychology

## 2015-07-04 DIAGNOSIS — F332 Major depressive disorder, recurrent severe without psychotic features: Secondary | ICD-10-CM

## 2015-07-18 ENCOUNTER — Ambulatory Visit (INDEPENDENT_AMBULATORY_CARE_PROVIDER_SITE_OTHER): Payer: BLUE CROSS/BLUE SHIELD | Admitting: Psychology

## 2015-07-18 DIAGNOSIS — F332 Major depressive disorder, recurrent severe without psychotic features: Secondary | ICD-10-CM

## 2015-07-25 ENCOUNTER — Ambulatory Visit (INDEPENDENT_AMBULATORY_CARE_PROVIDER_SITE_OTHER): Payer: BLUE CROSS/BLUE SHIELD | Admitting: Psychology

## 2015-07-25 DIAGNOSIS — F332 Major depressive disorder, recurrent severe without psychotic features: Secondary | ICD-10-CM | POA: Diagnosis not present

## 2015-08-01 ENCOUNTER — Ambulatory Visit: Payer: BLUE CROSS/BLUE SHIELD | Admitting: Psychology

## 2015-08-02 ENCOUNTER — Ambulatory Visit (INDEPENDENT_AMBULATORY_CARE_PROVIDER_SITE_OTHER)
Admission: RE | Admit: 2015-08-02 | Discharge: 2015-08-02 | Disposition: A | Discharge status: Home / Self Care | Payer: BLUE CROSS/BLUE SHIELD | Source: Ambulatory Visit | Attending: Nurse Practitioner | Admitting: Nurse Practitioner

## 2015-08-02 ENCOUNTER — Encounter: Payer: Self-pay | Admitting: Nurse Practitioner

## 2015-08-02 ENCOUNTER — Ambulatory Visit (INDEPENDENT_AMBULATORY_CARE_PROVIDER_SITE_OTHER): Payer: BLUE CROSS/BLUE SHIELD | Admitting: Nurse Practitioner

## 2015-08-02 VITALS — BP 98/70 | HR 72 | Temp 98.4°F | Ht 65.0 in | Wt 194.8 lb

## 2015-08-02 DIAGNOSIS — J069 Acute upper respiratory infection, unspecified: Secondary | ICD-10-CM

## 2015-08-02 MED ORDER — METHYLPREDNISOLONE 4 MG PO TABS
ORAL_TABLET | ORAL | Status: DC
Start: 2015-08-02 — End: 2015-08-08

## 2015-08-02 MED ORDER — HYDROCOD POLST-CPM POLST ER 10-8 MG/5ML PO SUER: 5.0000 mL | Freq: Every evening | ORAL | Status: DC | PRN

## 2015-08-02 NOTE — Progress Notes (Signed)
Patient ID: Sharon Cole, female    DOB: 1955/09/02  Age: 60 y.o. MRN: [id number]  CC: OTHER   HPI Sharon Cole presents for CC of cough x 1 day.   1) Pt report cough since yesterday Chest tenderness from coughing- green  TMax 99.4 Sick contacts- Pt had a co-worker with pna  HA, Myalgias   Treatment to date: Mucinex cough medicine 12 hr  tylenol   History Sharon Cole has a past medical history of Anxiety; Depression; Menopause; Bradycardia; Syncope; and Dizziness.   She has past surgical history that includes Breast surgery; Cholecystectomy; and Knee arthroscopy.   Her family history includes Cancer in her son; Heart disease in her brother, maternal grandfather, maternal grandmother, and mother.She reports that she has never smoked. She has never used smokeless tobacco. She reports that she drinks alcohol. She reports that she does not use illicit drugs.  Outpatient Prescriptions Prior to Visit  Medication Sig Dispense Refill  . escitalopram (LEXAPRO) 10 MG tablet Take 3 tablets (30 mg total) by mouth daily. TAKE 1 TABLET (20 MG TOTAL) BY MOUTH DAILY. 90 tablet 6  . LORazepam (ATIVAN) 0.5 MG tablet Take 1 tablet (0.5 mg total) by mouth daily. 30 tablet 3  . meclizine (ANTIVERT) 25 MG tablet Take one tablet bid prn dizziness 30 tablet 0  . omeprazole (PRILOSEC) 40 MG capsule TAKE 1 CAPSULE (40 MG TOTAL) BY MOUTH 2 (TWO) TIMES DAILY BEFORE A MEAL. 60 capsule 11  . nabumetone (RELAFEN) 500 MG tablet Take one tablet q 12 hours for the next 2 days then bid prn headache (Patient not taking: Reported on 08/02/2015) 30 tablet 1  . nystatin-triamcinolone ointment (MYCOLOG) Apply 1 application topically 2 (two) times daily. (Patient not taking: Reported on 08/02/2015) 30 g 0  . promethazine (PHENERGAN) 12.5 MG tablet Take 1 tablet (12.5 mg total) by mouth every 8 (eight) hours as needed for nausea or vomiting. (Patient not taking: Reported on 08/02/2015) 20 tablet 0  . traMADol (ULTRAM) 50 MG  tablet Take 1-2 tablets (50-100 mg total) by mouth 2 (two) times daily as needed. (Patient not taking: Reported on 08/02/2015) 60 tablet 0   No facility-administered medications prior to visit.    ROS Review of Systems  Constitutional: Positive for fever and fatigue. Negative for chills and diaphoresis.  HENT: Positive for congestion. Negative for ear pain, postnasal drip, rhinorrhea, sinus pressure, sneezing, sore throat and voice change.   Respiratory: Positive for cough. Negative for chest tightness, shortness of breath and wheezing.   Cardiovascular: Negative for chest pain, palpitations and leg swelling.  Gastrointestinal: Negative for nausea, vomiting and diarrhea.  Musculoskeletal: Positive for myalgias.  Skin: Negative for rash.  Neurological: Positive for headaches. Negative for dizziness.    Objective:  BP 98/70 mmHg  Pulse 72  Temp(Src) 98.4 F (36.9 C) (Oral)  Ht 5\' 5"  (1.651 m)  Wt 194 lb 12 oz (88.338 kg)  BMI 32.41 kg/m2  SpO2 97%  Physical Exam  Constitutional: She is oriented to person, place, and time. She appears well-developed and well-nourished. No distress.  HENT:  Head: Normocephalic and atraumatic.  Right Ear: External ear normal.  Left Ear: External ear normal.  Mouth/Throat: Oropharynx is clear and moist. No oropharyngeal exudate.  Eyes: EOM are normal. Pupils are equal, round, and reactive to light. Right eye exhibits no discharge. Left eye exhibits no discharge. No scleral icterus.  Neck: Normal range of motion. Neck supple.  Cardiovascular: Normal rate and regular rhythm.  Pulmonary/Chest: Effort normal and breath sounds normal. No respiratory distress. She has no wheezes. She has no rales. She exhibits no tenderness.  Lymphadenopathy:    She has no cervical adenopathy.  Neurological: She is alert and oriented to person, place, and time.  Skin: Skin is warm and dry. No rash noted. She is not diaphoretic.  Psychiatric: She has a normal mood and  affect. Her behavior is normal. Judgment and thought content normal.   Assessment & Plan:   Sharon Cole was seen today for other.  Diagnoses and all orders for this visit:  Acute URI -     DG Chest 2 View; Future  Other orders -     methylPREDNISolone (MEDROL) 4 MG tablet; Take 6 tablets by mouth with breakfast or lunch and decrease by 1 tablet each day until gone. -     chlorpheniramine-HYDROcodone (TUSSIONEX PENNKINETIC ER) 10-8 MG/5ML SUER; Take 5 mLs by mouth at bedtime as needed for cough.  I have discontinued Sharon Cole's nystatin-triamcinolone ointment, promethazine, nabumetone, and traMADol. I am also having her start on methylPREDNISolone and chlorpheniramine-HYDROcodone. Additionally, I am having her maintain her meclizine, omeprazole, escitalopram, and LORazepam.  Meds ordered this encounter  Medications  . methylPREDNISolone (MEDROL) 4 MG tablet    Sig: Take 6 tablets by mouth with breakfast or lunch and decrease by 1 tablet each day until gone.    Dispense:  21 tablet    Refill:  0    Order Specific Question:  Supervising Provider    Answer:  Deborra Medina L [[id number]]  . chlorpheniramine-HYDROcodone (TUSSIONEX PENNKINETIC ER) 10-8 MG/5ML SUER    Sig: Take 5 mLs by mouth at bedtime as needed for cough.    Dispense:  115 mL    Refill:  0    Order Specific Question:  Supervising Provider    Answer:  Crecencio Mc [[id number]]     Follow-up: Return if symptoms worsen or fail to improve.

## 2015-08-02 NOTE — Progress Notes (Signed)
Pre visit review using our clinic review tool, if applicable. No additional management support is needed unless otherwise documented below in the visit note. 

## 2015-08-02 NOTE — Assessment & Plan Note (Signed)
New onset Will treat conservatively due to probable viral nature Prednisone taper to decrease inflammation given  Instructions for taking done verbally and on AVS Flonase OTC Mucinex continue OTC CXR  FU prn worsening/failure to improve.

## 2015-08-02 NOTE — Patient Instructions (Signed)
Upper Respiratory Infection, Adult Most upper respiratory infections (URIs) are a viral infection of the air passages leading to the lungs. A URI affects the nose, throat, and upper air passages. The most common type of URI is nasopharyngitis and is typically referred to as "the common cold." URIs run their course and usually go away on their own. Most of the time, a URI does not require medical attention, but sometimes a bacterial infection in the upper airways can follow a viral infection. This is called a secondary infection. Sinus and middle ear infections are common types of secondary upper respiratory infections. Bacterial pneumonia can also complicate a URI. A URI can worsen asthma and chronic obstructive pulmonary disease (COPD). Sometimes, these complications can require emergency medical care and may be life threatening.  CAUSES Almost all URIs are caused by viruses. A virus is a type of germ and can spread from one person to another.  RISKS FACTORS You may be at risk for a URI if:   You smoke.   You have chronic heart or lung disease.  You have a weakened defense (immune) system.   You are very young or very old.   You have nasal allergies or asthma.  You work in crowded or poorly ventilated areas.  You work in health care facilities or schools. SIGNS AND SYMPTOMS  Symptoms typically develop 2-3 days after you come in contact with a cold virus. Most viral URIs last 7-10 days. However, viral URIs from the influenza virus (flu virus) can last 14-18 days and are typically more severe. Symptoms may include:   Runny or stuffy (congested) nose.   Sneezing.   Cough.   Sore throat.   Headache.   Fatigue.   Fever.   Loss of appetite.   Pain in your forehead, behind your eyes, and over your cheekbones (sinus pain).  Muscle aches.  DIAGNOSIS  Your health care provider may diagnose a URI by:  Physical exam.  Tests to check that your symptoms are not due to  another condition such as:  Strep throat.  Sinusitis.  Pneumonia.  Asthma. TREATMENT  A URI goes away on its own with time. It cannot be cured with medicines, but medicines may be prescribed or recommended to relieve symptoms. Medicines may help:  Reduce your fever.  Reduce your cough.  Relieve nasal congestion. HOME CARE INSTRUCTIONS   Take medicines only as directed by your health care provider.   Gargle warm saltwater or take cough drops to comfort your throat as directed by your health care provider.  Use a warm mist humidifier or inhale steam from a shower to increase air moisture. This may make it easier to breathe.  Drink enough fluid to keep your urine clear or pale yellow.   Eat soups and other clear broths and maintain good nutrition.   Rest as needed.   Return to work when your temperature has returned to normal or as your health care provider advises. You may need to stay home longer to avoid infecting others. You can also use a face mask and careful hand washing to prevent spread of the virus.  Increase the usage of your inhaler if you have asthma.   Do not use any tobacco products, including cigarettes, chewing tobacco, or electronic cigarettes. If you need help quitting, ask your health care provider. PREVENTION  The best way to protect yourself from getting a cold is to practice good hygiene.   Avoid oral or hand contact with people with cold   symptoms.   Wash your hands often if contact occurs.  There is no clear evidence that vitamin C, vitamin E, echinacea, or exercise reduces the chance of developing a cold. However, it is always recommended to get plenty of rest, exercise, and practice good nutrition.  SEEK MEDICAL CARE IF:   You are getting worse rather than better.   Your symptoms are not controlled by medicine.   You have chills.  You have worsening shortness of breath.  You have brown or red mucus.  You have yellow or brown nasal  discharge.  You have pain in your face, especially when you bend forward.  You have a fever.  You have swollen neck glands.  You have pain while swallowing.  You have white areas in the back of your throat. SEEK IMMEDIATE MEDICAL CARE IF:   You have severe or persistent:  Headache.  Ear pain.  Sinus pain.  Chest pain.  You have chronic lung disease and any of the following:  Wheezing.  Prolonged cough.  Coughing up blood.  A change in your usual mucus.  You have a stiff neck.  You have changes in your:  Vision.  Hearing.  Thinking.  Mood. MAKE SURE YOU:   Understand these instructions.  Will watch your condition.  Will get help right away if you are not doing well or get worse.   This information is not intended to replace advice given to you by your health care provider. Make sure you discuss any questions you have with your health care provider.   Document Released: 10/16/2000 Document Revised: 09/06/2014 Document Reviewed: 07/28/2013 Elsevier Interactive Patient Education 2016 [employer]  

## 2015-08-08 ENCOUNTER — Ambulatory Visit (INDEPENDENT_AMBULATORY_CARE_PROVIDER_SITE_OTHER): Payer: BLUE CROSS/BLUE SHIELD | Admitting: Psychology

## 2015-08-08 ENCOUNTER — Ambulatory Visit (INDEPENDENT_AMBULATORY_CARE_PROVIDER_SITE_OTHER): Payer: BLUE CROSS/BLUE SHIELD | Admitting: Family

## 2015-08-08 ENCOUNTER — Encounter: Payer: Self-pay | Admitting: Family

## 2015-08-08 VITALS — BP 104/74 | HR 54 | Temp 97.5°F | Resp 16 | Ht 65.0 in | Wt 188.0 lb

## 2015-08-08 DIAGNOSIS — R059 Cough, unspecified: Secondary | ICD-10-CM | POA: Insufficient documentation

## 2015-08-08 DIAGNOSIS — R05 Cough: Secondary | ICD-10-CM

## 2015-08-08 DIAGNOSIS — R911 Solitary pulmonary nodule: Secondary | ICD-10-CM | POA: Diagnosis not present

## 2015-08-08 DIAGNOSIS — F332 Major depressive disorder, recurrent severe without psychotic features: Secondary | ICD-10-CM | POA: Diagnosis not present

## 2015-08-08 MED ORDER — FLUTICASONE FUROATE-VILANTEROL 100-25 MCG/INH IN AEPB: 1.0000 | INHALATION_SPRAY | Freq: Every day | RESPIRATORY_TRACT | Status: DC

## 2015-08-08 MED ORDER — AZITHROMYCIN 250 MG PO TABS: ORAL_TABLET | ORAL | Status: DC

## 2015-08-08 NOTE — Assessment & Plan Note (Signed)
Symptoms continue to wax and wane with concern for possible underlying bronchitis given wheezing upon exam. Encouraged watchful waiting with written prescription for azithromycin provided if symptoms worsen. Start Breo. Continue over-the-counter medications as needed for symptom relief and supportive care. Follow-up if symptoms worsen or fail to improve.

## 2015-08-08 NOTE — Progress Notes (Signed)
Subjective:    Patient ID: Sharon Cole, female    DOB: 29-Aug-1955, 60 y.o.   MRN: [id number]  Chief Complaint  Patient presents with  . Cough    has a persistant cough, on and off fever    HPI:  Sharon Cole is a 60 y.o. female who  has a past medical history of Anxiety; Depression; Menopause; Bradycardia; Syncope; and Dizziness. and presents today follow-up office visit.  Previously evaluated in the office for symptoms of cough that were refractory to over-the-counter Mucinex and had been going on for approximately one day. Diagnosed with acute upper respiratory infection and treated with prednisone taper, Flonase and Mucinex. Chest x-ray with a right upper lobe nodule with CT of the chest recommended for further evaluation. Continues to experience the associated symptom of a cough that has been going on for approximately one week. Course of symptoms has waxed and waned with some improvements and then worsening. Describes a fever with the last being 2 days ago and the temperature max of about 101. Denies any other symptoms. No recent antibiotic use.   No Known Allergies   Current Outpatient Prescriptions on File Prior to Visit  Medication Sig Dispense Refill  . escitalopram (LEXAPRO) 10 MG tablet Take 3 tablets (30 mg total) by mouth daily. TAKE 1 TABLET (20 MG TOTAL) BY MOUTH DAILY. 90 tablet 6  . LORazepam (ATIVAN) 0.5 MG tablet Take 1 tablet (0.5 mg total) by mouth daily. 30 tablet 3  . meclizine (ANTIVERT) 25 MG tablet Take one tablet bid prn dizziness 30 tablet 0  . omeprazole (PRILOSEC) 40 MG capsule TAKE 1 CAPSULE (40 MG TOTAL) BY MOUTH 2 (TWO) TIMES DAILY BEFORE A MEAL. 60 capsule 11   No current facility-administered medications on file prior to visit.     Past Surgical History  Procedure Laterality Date  . Breast surgery      breast reduction  . Cholecystectomy    . Knee arthroscopy       Review of Systems  Constitutional: Positive for fever. Negative  for chills.  HENT: Positive for congestion. Negative for sinus pressure and sore throat.   Respiratory: Positive for cough and wheezing.   Neurological: Negative for headaches.      Objective:    BP 104/74 mmHg  Pulse 54  Temp(Src) 97.5 F (36.4 C) (Oral)  Resp 16  Ht 5\' 5"  (1.651 m)  Wt 188 lb (85.276 kg)  BMI 31.28 kg/m2  SpO2 95% Nursing note and vital signs reviewed.  Physical Exam  Constitutional: She is oriented to person, place, and time. She appears well-developed and well-nourished. No distress.  HENT:  Right Ear: Hearing, tympanic membrane, external ear and ear canal normal.  Left Ear: Hearing, tympanic membrane, external ear and ear canal normal.  Nose: Nose normal. Right sinus exhibits no maxillary sinus tenderness and no frontal sinus tenderness. Left sinus exhibits no maxillary sinus tenderness and no frontal sinus tenderness.  Mouth/Throat: Uvula is midline, oropharynx is clear and moist and mucous membranes are normal.  Cardiovascular: Normal rate, regular rhythm, normal heart sounds and intact distal pulses.   Pulmonary/Chest: Effort normal. She has wheezes.  Neurological: She is alert and oriented to person, place, and time.  Skin: Skin is warm and dry.  Psychiatric: She has a normal mood and affect. Her behavior is normal. Judgment and thought content normal.       Assessment & Plan:   Problem List Items Addressed This Visit  Other   Cough - Primary    Symptoms continue to wax and wane with concern for possible underlying bronchitis given wheezing upon exam. Encouraged watchful waiting with written prescription for azithromycin provided if symptoms worsen. Start Breo. Continue over-the-counter medications as needed for symptom relief and supportive care. Follow-up if symptoms worsen or fail to improve.      Relevant Medications   azithromycin (ZITHROMAX) 250 MG tablet   fluticasone furoate-vilanterol (BREO ELLIPTA) 100-25 MCG/INH AEPB   Other  Relevant Orders   CT Chest Wo Contrast   Lung nodule    Lung nodule present on previous x-ray with recommendation for CT scan for further evaluation. Obtain CT of the chest without contrast per recommendations for further evaluation of right lung nodule. Follow-up pending CT results.         I have discontinued Ms. Buchinger's methylPREDNISolone and chlorpheniramine-HYDROcodone. I am also having her start on azithromycin and fluticasone furoate-vilanterol. Additionally, I am having her maintain her meclizine, omeprazole, escitalopram, and LORazepam.   Meds ordered this encounter  Medications  . azithromycin (ZITHROMAX) 250 MG tablet    Sig: Take 2 tablets by mouth for 1 day and then 1 tablet by mouth daily for 4 days.    Dispense:  6 tablet    Refill:  0    Order Specific Question:  Supervising Provider    Answer:  Pricilla Holm A [id number]  . fluticasone furoate-vilanterol (BREO ELLIPTA) 100-25 MCG/INH AEPB    Sig: Inhale 1 puff into the lungs daily.    Dispense:  14 each    Refill:  0    Order Specific Question:  Supervising Provider    Answer:  Pricilla Holm A [id number]     Follow-up: Return if symptoms worsen or fail to improve.  Mauricio Po, FNP

## 2015-08-08 NOTE — Progress Notes (Signed)
Pre visit review using our clinic review tool, if applicable. No additional management support is needed unless otherwise documented below in the visit note. 

## 2015-08-08 NOTE — Assessment & Plan Note (Signed)
Lung nodule present on previous x-ray with recommendation for CT scan for further evaluation. Obtain CT of the chest without contrast per recommendations for further evaluation of right lung nodule. Follow-up pending CT results.

## 2015-08-08 NOTE — Patient Instructions (Signed)
Thank you for choosing [facility].  Summary/Instructions:  Please start the Breo daily for the next 2-3 days and then as needed.  Azithromycin if your symptoms worsen or do not improve.  They will call to schedule your chest CT scan.  Your prescription(s) have been submitted to your pharmacy or been printed and provided for you. Please take as directed and contact our office if you believe you are having problem(s) with the medication(s) or have any questions.  If your symptoms worsen or fail to improve, please contact our office for further instruction, or in case of emergency go directly to the emergency room at the closest medical facility.   General Recommendations:    Please drink plenty of fluids.  Get plenty of rest   Sleep in humidified air  Use saline nasal sprays  Netti pot   OTC Medications:  Decongestants - helps relieve congestion   Flonase (generic fluticasone) or Nasacort (generic triamcinolone) - please make sure to use the "cross-over" technique at a 45 degree angle towards the opposite eye as opposed to straight up the nasal passageway.   Sudafed (generic pseudoephedrine - Note this is the one that is available behind the pharmacy counter); Products with phenylephrine (-PE) may also be used but is often not as effective as pseudoephedrine.   If you have HIGH BLOOD PRESSURE - Coricidin HBP; AVOID any product that is -D as this contains pseudoephedrine which may increase your blood pressure.  Afrin (oxymetazoline) every 6-8 hours for up to 3 days.   Allergies - helps relieve runny nose, itchy eyes and sneezing   Claritin (generic loratidine), Allegra (fexofenidine), or Zyrtec (generic cyrterizine) for runny nose. These medications should not cause drowsiness.  Note - Benadryl (generic diphenhydramine) may be used however may cause drowsiness  Cough -   Delsym or Robitussin (generic dextromethorphan)  Expectorants - helps loosen mucus to ease  removal   Mucinex (generic guaifenesin) as directed on the package.  Headaches / General Aches   Tylenol (generic acetaminophen) - DO NOT EXCEED 3 grams (3,000 mg) in a 24 hour time period  Advil/Motrin (generic ibuprofen)   Sore Throat -   Salt water gargle   Chloraseptic (generic benzocaine) spray or lozenges / Sucrets (generic dyclonine)

## 2015-08-11 ENCOUNTER — Other Ambulatory Visit: Payer: Self-pay | Admitting: Otolaryngology

## 2015-08-11 DIAGNOSIS — E079 Disorder of thyroid, unspecified: Secondary | ICD-10-CM

## 2015-08-15 ENCOUNTER — Ambulatory Visit (INDEPENDENT_AMBULATORY_CARE_PROVIDER_SITE_OTHER): Payer: BLUE CROSS/BLUE SHIELD | Admitting: Psychology

## 2015-08-15 DIAGNOSIS — F332 Major depressive disorder, recurrent severe without psychotic features: Secondary | ICD-10-CM | POA: Diagnosis not present

## 2015-08-22 ENCOUNTER — Ambulatory Visit (INDEPENDENT_AMBULATORY_CARE_PROVIDER_SITE_OTHER): Payer: BLUE CROSS/BLUE SHIELD | Admitting: Psychology

## 2015-08-22 DIAGNOSIS — F332 Major depressive disorder, recurrent severe without psychotic features: Secondary | ICD-10-CM | POA: Diagnosis not present

## 2015-08-24 ENCOUNTER — Telehealth: Payer: Self-pay | Admitting: *Deleted

## 2015-08-24 DIAGNOSIS — Z760 Encounter for issue of repeat prescription: Secondary | ICD-10-CM

## 2015-08-24 MED ORDER — ESCITALOPRAM OXALATE 10 MG PO TABS: 30.0000 mg | ORAL_TABLET | Freq: Every day | ORAL | Status: DC

## 2015-08-24 MED ORDER — LORAZEPAM 0.5 MG PO TABS: 0.5000 mg | ORAL_TABLET | Freq: Every day | ORAL | Status: DC

## 2015-08-24 NOTE — Telephone Encounter (Signed)
Printed and signed.  

## 2015-08-24 NOTE — Telephone Encounter (Signed)
Called pt no naswer LMOM rx's sent to [facility].../lmb

## 2015-08-24 NOTE — Telephone Encounter (Signed)
Left msg on triage requesting refills on her lexapro 30 mg & lorazepam 0.5 need to [facility] to [facility]..sent citalopram pls advise on Lorazepam.. Johny Chess

## 2015-08-29 ENCOUNTER — Ambulatory Visit: Payer: BLUE CROSS/BLUE SHIELD | Admitting: Psychology

## 2015-08-31 ENCOUNTER — Encounter: Payer: Self-pay | Admitting: Internal Medicine

## 2015-09-05 ENCOUNTER — Ambulatory Visit (INDEPENDENT_AMBULATORY_CARE_PROVIDER_SITE_OTHER): Payer: BLUE CROSS/BLUE SHIELD | Admitting: Psychology

## 2015-09-05 DIAGNOSIS — F331 Major depressive disorder, recurrent, moderate: Secondary | ICD-10-CM

## 2015-09-12 ENCOUNTER — Ambulatory Visit (INDEPENDENT_AMBULATORY_CARE_PROVIDER_SITE_OTHER): Payer: BLUE CROSS/BLUE SHIELD | Admitting: Psychology

## 2015-09-12 DIAGNOSIS — F331 Major depressive disorder, recurrent, moderate: Secondary | ICD-10-CM

## 2015-09-19 ENCOUNTER — Ambulatory Visit: Payer: Self-pay | Admitting: Psychology

## 2015-09-26 ENCOUNTER — Ambulatory Visit (INDEPENDENT_AMBULATORY_CARE_PROVIDER_SITE_OTHER): Payer: BLUE CROSS/BLUE SHIELD | Admitting: Psychology

## 2015-09-26 DIAGNOSIS — F331 Major depressive disorder, recurrent, moderate: Secondary | ICD-10-CM

## 2015-10-03 ENCOUNTER — Ambulatory Visit (INDEPENDENT_AMBULATORY_CARE_PROVIDER_SITE_OTHER): Payer: BLUE CROSS/BLUE SHIELD | Admitting: Psychology

## 2015-10-03 DIAGNOSIS — F331 Major depressive disorder, recurrent, moderate: Secondary | ICD-10-CM | POA: Diagnosis not present

## 2015-10-10 ENCOUNTER — Ambulatory Visit (INDEPENDENT_AMBULATORY_CARE_PROVIDER_SITE_OTHER): Payer: BLUE CROSS/BLUE SHIELD | Admitting: Psychology

## 2015-10-10 DIAGNOSIS — F331 Major depressive disorder, recurrent, moderate: Secondary | ICD-10-CM | POA: Diagnosis not present

## 2015-10-17 ENCOUNTER — Ambulatory Visit: Payer: Self-pay | Admitting: Psychology

## 2015-10-24 ENCOUNTER — Ambulatory Visit (INDEPENDENT_AMBULATORY_CARE_PROVIDER_SITE_OTHER): Payer: BLUE CROSS/BLUE SHIELD | Admitting: Psychology

## 2015-10-24 DIAGNOSIS — F332 Major depressive disorder, recurrent severe without psychotic features: Secondary | ICD-10-CM | POA: Diagnosis not present

## 2015-10-31 ENCOUNTER — Ambulatory Visit: Payer: Self-pay | Admitting: Psychology

## 2015-11-14 ENCOUNTER — Ambulatory Visit (INDEPENDENT_AMBULATORY_CARE_PROVIDER_SITE_OTHER): Payer: BLUE CROSS/BLUE SHIELD | Admitting: Psychology

## 2015-11-14 DIAGNOSIS — F332 Major depressive disorder, recurrent severe without psychotic features: Secondary | ICD-10-CM

## 2015-11-17 ENCOUNTER — Other Ambulatory Visit: Payer: Self-pay | Admitting: Neurosurgery

## 2015-11-17 DIAGNOSIS — M5416 Radiculopathy, lumbar region: Secondary | ICD-10-CM

## 2015-11-21 ENCOUNTER — Ambulatory Visit (INDEPENDENT_AMBULATORY_CARE_PROVIDER_SITE_OTHER): Payer: BLUE CROSS/BLUE SHIELD | Admitting: Psychology

## 2015-11-21 DIAGNOSIS — F332 Major depressive disorder, recurrent severe without psychotic features: Secondary | ICD-10-CM

## 2015-11-22 ENCOUNTER — Ambulatory Visit
Admission: RE | Admit: 2015-11-22 | Discharge: 2015-11-22 | Disposition: A | Discharge status: Home / Self Care | Payer: BLUE CROSS/BLUE SHIELD | Source: Ambulatory Visit | Attending: Neurosurgery | Admitting: Neurosurgery

## 2015-11-22 DIAGNOSIS — M5416 Radiculopathy, lumbar region: Secondary | ICD-10-CM

## 2015-11-23 ENCOUNTER — Other Ambulatory Visit: Payer: Self-pay

## 2015-11-28 ENCOUNTER — Ambulatory Visit: Payer: BLUE CROSS/BLUE SHIELD | Admitting: Psychology

## 2015-12-05 ENCOUNTER — Ambulatory Visit (INDEPENDENT_AMBULATORY_CARE_PROVIDER_SITE_OTHER): Payer: BLUE CROSS/BLUE SHIELD | Admitting: Psychology

## 2015-12-05 DIAGNOSIS — F331 Major depressive disorder, recurrent, moderate: Secondary | ICD-10-CM | POA: Diagnosis not present

## 2015-12-12 ENCOUNTER — Ambulatory Visit (INDEPENDENT_AMBULATORY_CARE_PROVIDER_SITE_OTHER): Payer: BLUE CROSS/BLUE SHIELD | Admitting: Psychology

## 2015-12-12 DIAGNOSIS — F331 Major depressive disorder, recurrent, moderate: Secondary | ICD-10-CM

## 2015-12-19 ENCOUNTER — Ambulatory Visit (INDEPENDENT_AMBULATORY_CARE_PROVIDER_SITE_OTHER): Payer: BLUE CROSS/BLUE SHIELD | Admitting: Psychology

## 2015-12-19 DIAGNOSIS — F332 Major depressive disorder, recurrent severe without psychotic features: Secondary | ICD-10-CM

## 2015-12-20 ENCOUNTER — Ambulatory Visit: Payer: Self-pay | Admitting: Psychology

## 2015-12-21 ENCOUNTER — Ambulatory Visit: Payer: Self-pay | Admitting: Psychology

## 2015-12-22 ENCOUNTER — Ambulatory Visit: Payer: Self-pay | Admitting: Psychology

## 2015-12-25 ENCOUNTER — Ambulatory Visit: Payer: Self-pay | Admitting: Psychology

## 2015-12-26 ENCOUNTER — Ambulatory Visit (INDEPENDENT_AMBULATORY_CARE_PROVIDER_SITE_OTHER): Payer: BLUE CROSS/BLUE SHIELD | Admitting: Psychology

## 2015-12-26 DIAGNOSIS — F331 Major depressive disorder, recurrent, moderate: Secondary | ICD-10-CM | POA: Diagnosis not present

## 2015-12-27 ENCOUNTER — Ambulatory Visit: Payer: Self-pay | Admitting: Psychology

## 2015-12-28 ENCOUNTER — Ambulatory Visit: Payer: Self-pay | Admitting: Psychology

## 2015-12-29 ENCOUNTER — Ambulatory Visit: Payer: Self-pay | Admitting: Psychology

## 2016-01-01 ENCOUNTER — Ambulatory Visit: Payer: Self-pay | Admitting: Psychology

## 2016-01-02 ENCOUNTER — Ambulatory Visit (INDEPENDENT_AMBULATORY_CARE_PROVIDER_SITE_OTHER): Payer: BLUE CROSS/BLUE SHIELD | Admitting: Psychology

## 2016-01-02 DIAGNOSIS — F332 Major depressive disorder, recurrent severe without psychotic features: Secondary | ICD-10-CM

## 2016-01-03 ENCOUNTER — Ambulatory Visit: Payer: Self-pay | Admitting: Psychology

## 2016-01-04 ENCOUNTER — Ambulatory Visit: Payer: Self-pay | Admitting: Psychology

## 2016-01-05 ENCOUNTER — Ambulatory Visit: Payer: Self-pay | Admitting: Psychology

## 2016-01-09 ENCOUNTER — Ambulatory Visit (INDEPENDENT_AMBULATORY_CARE_PROVIDER_SITE_OTHER): Payer: BLUE CROSS/BLUE SHIELD | Admitting: Psychology

## 2016-01-09 DIAGNOSIS — F332 Major depressive disorder, recurrent severe without psychotic features: Secondary | ICD-10-CM

## 2016-01-16 ENCOUNTER — Ambulatory Visit (INDEPENDENT_AMBULATORY_CARE_PROVIDER_SITE_OTHER): Payer: BLUE CROSS/BLUE SHIELD | Admitting: Psychology

## 2016-01-16 DIAGNOSIS — F332 Major depressive disorder, recurrent severe without psychotic features: Secondary | ICD-10-CM

## 2016-01-23 ENCOUNTER — Ambulatory Visit: Payer: BLUE CROSS/BLUE SHIELD | Admitting: Psychology

## 2016-01-29 ENCOUNTER — Other Ambulatory Visit: Payer: Self-pay | Admitting: Internal Medicine

## 2016-01-29 DIAGNOSIS — Z760 Encounter for issue of repeat prescription: Secondary | ICD-10-CM

## 2016-01-30 ENCOUNTER — Ambulatory Visit (INDEPENDENT_AMBULATORY_CARE_PROVIDER_SITE_OTHER): Payer: BLUE CROSS/BLUE SHIELD | Admitting: Psychology

## 2016-01-30 DIAGNOSIS — F331 Major depressive disorder, recurrent, moderate: Secondary | ICD-10-CM

## 2016-01-30 NOTE — Telephone Encounter (Signed)
Sent to pharmacy 

## 2016-02-06 ENCOUNTER — Ambulatory Visit (INDEPENDENT_AMBULATORY_CARE_PROVIDER_SITE_OTHER): Payer: BLUE CROSS/BLUE SHIELD | Admitting: Psychology

## 2016-02-06 DIAGNOSIS — F332 Major depressive disorder, recurrent severe without psychotic features: Secondary | ICD-10-CM | POA: Diagnosis not present

## 2016-02-13 ENCOUNTER — Ambulatory Visit (INDEPENDENT_AMBULATORY_CARE_PROVIDER_SITE_OTHER): Payer: BLUE CROSS/BLUE SHIELD | Admitting: Psychology

## 2016-02-13 DIAGNOSIS — F331 Major depressive disorder, recurrent, moderate: Secondary | ICD-10-CM

## 2016-02-20 ENCOUNTER — Ambulatory Visit: Payer: BLUE CROSS/BLUE SHIELD | Admitting: Psychology

## 2016-02-24 ENCOUNTER — Other Ambulatory Visit: Payer: Self-pay | Admitting: Internal Medicine

## 2016-02-27 ENCOUNTER — Ambulatory Visit (INDEPENDENT_AMBULATORY_CARE_PROVIDER_SITE_OTHER): Payer: BLUE CROSS/BLUE SHIELD | Admitting: Psychology

## 2016-02-27 DIAGNOSIS — F332 Major depressive disorder, recurrent severe without psychotic features: Secondary | ICD-10-CM | POA: Diagnosis not present

## 2016-03-05 ENCOUNTER — Ambulatory Visit (INDEPENDENT_AMBULATORY_CARE_PROVIDER_SITE_OTHER): Payer: BLUE CROSS/BLUE SHIELD | Admitting: Psychology

## 2016-03-05 DIAGNOSIS — F332 Major depressive disorder, recurrent severe without psychotic features: Secondary | ICD-10-CM | POA: Diagnosis not present

## 2016-03-12 ENCOUNTER — Ambulatory Visit (INDEPENDENT_AMBULATORY_CARE_PROVIDER_SITE_OTHER): Payer: BLUE CROSS/BLUE SHIELD | Admitting: Psychology

## 2016-03-12 DIAGNOSIS — F332 Major depressive disorder, recurrent severe without psychotic features: Secondary | ICD-10-CM

## 2016-03-19 ENCOUNTER — Ambulatory Visit (INDEPENDENT_AMBULATORY_CARE_PROVIDER_SITE_OTHER): Payer: BLUE CROSS/BLUE SHIELD | Admitting: Psychology

## 2016-03-19 DIAGNOSIS — F332 Major depressive disorder, recurrent severe without psychotic features: Secondary | ICD-10-CM

## 2016-03-26 ENCOUNTER — Ambulatory Visit (INDEPENDENT_AMBULATORY_CARE_PROVIDER_SITE_OTHER): Payer: BLUE CROSS/BLUE SHIELD | Admitting: Psychology

## 2016-03-26 DIAGNOSIS — F332 Major depressive disorder, recurrent severe without psychotic features: Secondary | ICD-10-CM

## 2016-04-02 ENCOUNTER — Ambulatory Visit (INDEPENDENT_AMBULATORY_CARE_PROVIDER_SITE_OTHER): Payer: BLUE CROSS/BLUE SHIELD | Admitting: Psychology

## 2016-04-02 DIAGNOSIS — F332 Major depressive disorder, recurrent severe without psychotic features: Secondary | ICD-10-CM

## 2016-04-09 ENCOUNTER — Ambulatory Visit (INDEPENDENT_AMBULATORY_CARE_PROVIDER_SITE_OTHER): Payer: BLUE CROSS/BLUE SHIELD | Admitting: Psychology

## 2016-04-09 DIAGNOSIS — F332 Major depressive disorder, recurrent severe without psychotic features: Secondary | ICD-10-CM | POA: Diagnosis not present

## 2016-04-23 ENCOUNTER — Ambulatory Visit: Payer: BLUE CROSS/BLUE SHIELD | Admitting: Psychology

## 2016-04-30 ENCOUNTER — Ambulatory Visit: Payer: Self-pay | Admitting: Psychology

## 2016-05-02 ENCOUNTER — Other Ambulatory Visit: Payer: Self-pay | Admitting: Internal Medicine

## 2016-05-02 DIAGNOSIS — Z760 Encounter for issue of repeat prescription: Secondary | ICD-10-CM

## 2016-05-03 NOTE — Telephone Encounter (Signed)
Faxed to pharmacy

## 2016-05-07 ENCOUNTER — Ambulatory Visit (INDEPENDENT_AMBULATORY_CARE_PROVIDER_SITE_OTHER): Payer: BLUE CROSS/BLUE SHIELD | Admitting: Psychology

## 2016-05-07 DIAGNOSIS — F332 Major depressive disorder, recurrent severe without psychotic features: Secondary | ICD-10-CM

## 2016-05-14 ENCOUNTER — Ambulatory Visit (INDEPENDENT_AMBULATORY_CARE_PROVIDER_SITE_OTHER): Payer: 59 | Admitting: Psychology

## 2016-05-14 DIAGNOSIS — F332 Major depressive disorder, recurrent severe without psychotic features: Secondary | ICD-10-CM

## 2016-05-21 ENCOUNTER — Other Ambulatory Visit: Payer: Self-pay | Admitting: Internal Medicine

## 2016-05-21 ENCOUNTER — Ambulatory Visit (INDEPENDENT_AMBULATORY_CARE_PROVIDER_SITE_OTHER): Payer: 59 | Admitting: Psychology

## 2016-05-21 DIAGNOSIS — F332 Major depressive disorder, recurrent severe without psychotic features: Secondary | ICD-10-CM | POA: Diagnosis not present

## 2016-05-28 ENCOUNTER — Ambulatory Visit: Payer: BLUE CROSS/BLUE SHIELD | Admitting: Psychology

## 2016-06-04 ENCOUNTER — Ambulatory Visit (INDEPENDENT_AMBULATORY_CARE_PROVIDER_SITE_OTHER): Payer: 59 | Admitting: Psychology

## 2016-06-04 DIAGNOSIS — F332 Major depressive disorder, recurrent severe without psychotic features: Secondary | ICD-10-CM | POA: Diagnosis not present

## 2016-06-11 ENCOUNTER — Ambulatory Visit (INDEPENDENT_AMBULATORY_CARE_PROVIDER_SITE_OTHER): Payer: 59 | Admitting: Psychology

## 2016-06-11 DIAGNOSIS — F332 Major depressive disorder, recurrent severe without psychotic features: Secondary | ICD-10-CM | POA: Diagnosis not present

## 2016-06-13 ENCOUNTER — Other Ambulatory Visit: Payer: Self-pay | Admitting: Internal Medicine

## 2016-06-13 DIAGNOSIS — R9389 Abnormal findings on diagnostic imaging of other specified body structures: Secondary | ICD-10-CM

## 2016-06-17 ENCOUNTER — Ambulatory Visit
Admission: RE | Admit: 2016-06-17 | Discharge: 2016-06-17 | Disposition: A | Discharge status: Home / Self Care | Payer: 59 | Source: Ambulatory Visit | Attending: Internal Medicine | Admitting: Internal Medicine

## 2016-06-17 DIAGNOSIS — R9389 Abnormal findings on diagnostic imaging of other specified body structures: Secondary | ICD-10-CM

## 2016-06-18 ENCOUNTER — Ambulatory Visit: Payer: 59 | Admitting: Psychology

## 2016-06-19 ENCOUNTER — Telehealth: Payer: Self-pay

## 2016-06-19 NOTE — Telephone Encounter (Addendum)
Received call from Dr. Coralyn Mark, requesting to get pt scheduled for consult for abnormal CT. RB has opened clinc on 2/22. Please schedule pt for that day. Once scheduled please let Margie know, so I can contact Dr. Coralyn Mark to make her aware. Thanks.

## 2016-06-19 NOTE — Telephone Encounter (Signed)
Patient called and scheduled appt for 06/27/2016 at 3:00 pm.

## 2016-06-20 NOTE — Telephone Encounter (Signed)
Dr. Rudene Anda aware of scheduled appointment. Nothing further needed.

## 2016-06-24 DIAGNOSIS — G43909 Migraine, unspecified, not intractable, without status migrainosus: Secondary | ICD-10-CM | POA: Insufficient documentation

## 2016-06-24 DIAGNOSIS — H8110 Benign paroxysmal vertigo, unspecified ear: Secondary | ICD-10-CM | POA: Insufficient documentation

## 2016-06-25 ENCOUNTER — Ambulatory Visit (INDEPENDENT_AMBULATORY_CARE_PROVIDER_SITE_OTHER): Payer: 59 | Admitting: Psychology

## 2016-06-25 DIAGNOSIS — F332 Major depressive disorder, recurrent severe without psychotic features: Secondary | ICD-10-CM | POA: Diagnosis not present

## 2016-06-27 ENCOUNTER — Telehealth: Payer: Self-pay | Admitting: Emergency Medicine

## 2016-06-27 ENCOUNTER — Ambulatory Visit (INDEPENDENT_AMBULATORY_CARE_PROVIDER_SITE_OTHER): Payer: 59 | Admitting: Emergency Medicine

## 2016-06-27 ENCOUNTER — Encounter: Payer: Self-pay | Admitting: Emergency Medicine

## 2016-06-27 DIAGNOSIS — R911 Solitary pulmonary nodule: Secondary | ICD-10-CM

## 2016-06-27 NOTE — Progress Notes (Signed)
Subjective:    Patient ID: Sharon Cole, female    DOB: 09/04/1955, 61 y.o.   MRN: [id number]  HPI 61 yo never smoker with a hx of anxiety, depression, hx biliary stones. She underwent a CT chest on 06/17/16 to evaluate a RUL nodule seen on CXR that was done March 2017 to evaluate a URI. This showed a 1.6 x 0.8cm RUL nodule, ? Some evolving Ca. In retrospect she knows that she was told that she had a nodule since 4 years ago while in [place]. She can't recall a CT scan in [place], only CXR's.                                                                                                                                              Review of Systems  Constitutional: Negative.  Negative for fever and unexpected weight change.  HENT: Negative.  Negative for congestion, dental problem, ear pain, nosebleeds, postnasal drip, rhinorrhea, sinus pressure, sneezing, sore throat and trouble swallowing.   Eyes: Negative.  Negative for redness and itching.  Respiratory: Negative.  Negative for cough, chest tightness, shortness of breath and wheezing.   Cardiovascular: Negative.  Negative for palpitations and leg swelling.  Gastrointestinal: Negative.  Negative for nausea and vomiting.  Endocrine: Negative.   Genitourinary: Negative.  Negative for dysuria.  Musculoskeletal: Positive for joint swelling.  Skin: Negative.  Negative for rash.  Allergic/Immunologic: Negative.   Neurological: Positive for dizziness and headaches.  Hematological: Does not bruise/bleed easily.  Psychiatric/Behavioral: Negative.  Negative for dysphoric mood. The patient is not nervous/anxious.     Past Medical History:  Diagnosis Date  . Anxiety   . Bradycardia   . Depression   . Dizziness   . Menopause   . Syncope      Family History  Problem Relation Age of Onset  . Heart disease Mother   . Heart disease Brother   . Heart disease Maternal Grandmother   . Heart disease Maternal Grandfather   . Cancer Son    unknown     Social History   Social History  . Marital status: Single    Spouse name: N/A  . Number of children: N/A  . Years of education: N/A   Occupational History  . Not on file.   Social History Main Topics  . Smoking status: Never Smoker  . Smokeless tobacco: Never Used  . Alcohol use 0.0 oz/week     Comment: daily  . Drug use: No  . Sexual activity: Yes    Partners: Male   Other Topics Concern  . Not on file   Social History Narrative  . No narrative on file   She has lived in [place], [place], [place] She has done furniture refinishing, usually used a mask.   No Known Allergies   Outpatient Medications Prior to Visit  Medication Sig  Dispense Refill  . escitalopram (LEXAPRO) 10 MG tablet TAKE 3 TABLETS (30 MG TOTAL) BY MOUTH DAILY. 90 tablet 1  . LORazepam (ATIVAN) 0.5 MG tablet Take 1 tablet (0.5 mg total) by mouth daily. Needs visit for any refills. 30 tablet 0  . meclizine (ANTIVERT) 25 MG tablet Take one tablet bid prn dizziness 30 tablet 0  . omeprazole (PRILOSEC) 40 MG capsule TAKE 1 CAPSULE (40 MG TOTAL) BY MOUTH 2 (TWO) TIMES DAILY BEFORE A MEAL. 60 capsule 3  . fluticasone furoate-vilanterol (BREO ELLIPTA) 100-25 MCG/INH AEPB Inhale 1 puff into the lungs daily. (Patient not taking: Reported on 06/27/2016) 14 each 0  . azithromycin (ZITHROMAX) 250 MG tablet Take 2 tablets by mouth for 1 day and then 1 tablet by mouth daily for 4 days. (Patient not taking: Reported on 06/27/2016) 6 tablet 0   No facility-administered medications prior to visit.         Objective:   Physical Exam Vitals:   06/27/16 1520  BP: 102/60  Pulse: 61  SpO2: 96%  Weight: 203 lb 6.4 oz (92.3 kg)  Height: 5\' 5"  (1.651 m)  Gen: Pleasant, well-nourished, in no distress,  normal affect  ENT: No lesions,  mouth clear,  oropharynx clear, no postnasal drip  Neck: No JVD, no TMG, no carotid bruits  Lungs: No use of accessory muscles, clear without rales or rhonchi  Cardiovascular: RRR,  heart sounds normal, no murmur or gallops, no peripheral edema  Musculoskeletal: No deformities, no cyanosis or clubbing  Neuro: alert, non focal  Skin: Warm, no lesions or rashes  06/17/16 --  FINDINGS: Cardiovascular: Normal heart size.  No pericardial effusion.  Mediastinum/Nodes: 1 cm nodule in the with right thyroid, usually incidental at this size.  Negative for adenopathy.  Lungs/Pleura: Confirmed right upper lobe nodule with the elongated lobulated morphology measuring 16 x 8 mm. No internal fat density is noted. There is posterior high density components, although not reaching threshold for definite calcification. Given the shape, this could reflect dilated peripheral bronchiole, but no branching morphology typical for this entity. There is extension towards the pleura without pleural distortion. Other nodular densities are subpleural and likely lymph nodes. No consolidation, emphysema, or airway thickening.  Upper Abdomen: Postoperative abdomen with pneumobilia  Musculoskeletal: No acute or aggressive finding.  IMPRESSION: 16 x 8 mm right upper lobe pulmonary nodule as described above. Consider one of the following in 3 months for both low-risk and high-risk individuals: (a) repeat chest CT, (b) follow-up PET-CT, or (c) tissue sampling. This recommendation follows the consensus statement: Guidelines for Management of Incidental Pulmonary Nodules Detected on CT Images: From the Fleischner Society 2017; Radiology 2017; 284:228-243.       Assessment & Plan:  Lung nodule RUL pulmonary nodule that measures 1.6cm in largest dimension. She believes that this has been seen before as far back as 4 yrs ago in [place]. 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 We will obtain her old films to compare. If stable we may be able to defer PET scan. Otherwise I'd like to get the PET to risk stratify, help determine how frequently to follow or whether we need to refer to  [facility].   Baltazar Apo, MD, PhD 06/27/2016, 3:53 PM [facility] [phone] or if no answer [phone]

## 2016-06-27 NOTE — Patient Instructions (Signed)
We will obtain your past CXR's and CT scans from [place] to compare with your current scan.  Based on our comparison we will decide whether to obtain a PET scan Follow with Dr Lamonte Sakai next available to review the radiology together.

## 2016-06-27 NOTE — Assessment & Plan Note (Signed)
RUL pulmonary nodule that measures 1.6cm in largest dimension. She believes that this has been seen before as far back as 4 yrs ago in [place]. 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 We will obtain her old films to compare. If stable we may be able to defer PET scan. Otherwise I'd like to get the PET to risk stratify, help determine how frequently to follow or whether we need to refer to [facility].

## 2016-06-27 NOTE — Telephone Encounter (Signed)
lmtcb X1 for pt  

## 2016-06-28 NOTE — Telephone Encounter (Signed)
Called and spoke to pt. Pt states she called to give her past physician information. Information has been completed on release form and faxed back to [facility] at [phone]. This form has been placed in [facility]'s medical release folder. Will keep release form until records are released. Will sign off.

## 2016-06-28 NOTE — Telephone Encounter (Signed)
[phone] pt calling back

## 2016-07-03 ENCOUNTER — Ambulatory Visit (INDEPENDENT_AMBULATORY_CARE_PROVIDER_SITE_OTHER): Payer: 59 | Admitting: Neurology

## 2016-07-03 ENCOUNTER — Encounter: Payer: Self-pay | Admitting: Neurology

## 2016-07-03 VITALS — BP 102/73 | HR 62 | Resp 16 | Ht 64.5 in | Wt 198.0 lb

## 2016-07-03 DIAGNOSIS — R42 Dizziness and giddiness: Secondary | ICD-10-CM

## 2016-07-03 DIAGNOSIS — G43019 Migraine without aura, intractable, without status migrainosus: Secondary | ICD-10-CM | POA: Insufficient documentation

## 2016-07-03 HISTORY — DX: Migraine without aura, intractable, without status migrainosus: G43.019

## 2016-07-03 MED ORDER — TOPIRAMATE 25 MG PO TABS: ORAL_TABLET | ORAL | 3 refills | Status: DC

## 2016-07-03 NOTE — Progress Notes (Signed)
Reason for visit: Vertigo, headache  Referring physician: Dr. Magda Cole is a 61 y.o. female  History of present illness:  [name] is a 61 year old right-handed white female with a greater than 15 year history of problems with headaches. The patient indicates that her headaches are usually in the front of the head, and around the eyes. The patient has felt that she has had sinus headaches in the past, she has treated the headaches with Tylenol Sinus without much benefit. The headaches are relatively frequent, occurring about 50% of the days of the month. The headaches are usually present in the morning, and may last all day long. The headaches may be associated with some nausea, but no vomiting, with some photophobia and phonophobia. Occasionally she may have some neck discomfort with the headaches, she denies any visual disturbances such as double vision, or loss of vision. The patient has developed some issues with vertigo over the last 10 years. The vertigo is not always associated with the headache. The vertigo often times occurs with looking up, she can get the vertigo by rolling on her side. She has been seen through vestibular rehabilitation and the Epley maneuvers have not been fully effective in controlling her vertigo. She may get nauseated with the vertigo, the vertigo may last only a few moments if she remains still. She has had 2 episodes of syncope, one occurred when she went from a lying position to a standing position, another event was associated with vertigo with nausea and vomiting, then she subsequently blacked out. This event was witnessed, no jerking or twitching or tongue biting or loss of bowel or bladder control was noted with these events. The patient has been seen by Dr. Tomi Cole in 2016 for this issue of the syncope, headache, and vertigo. 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 MRA of the head was unremarkable, an EEG study was normal. The patient was given a trial on Topamax for presumed migraine at that time, but the patient only took the medication for a week at a 25 mg dose. The patient has been on Lexapro, but she has been on this medication she believes for greater than 15 years. There has been no association of her symptoms and the medication. The patient is not sure whether Ativan improves her vertigo or not. She has been seen by Dr. Constance Cole, and she is sent to this office for an evaluation of possible vertigo associated with a migraine syndrome. She claims that her mother has vertigo, no family history of headache. The patient does not report any numbness or weakness of the face, arms, or legs. She does note some frequent coughing with swallowing liquids, but no changes in speech.  Past Medical History:  Diagnosis Date  . Anxiety   . Bradycardia   . Common migraine with intractable migraine 07/03/2016  . Depression   . Dizziness   . Headache   . Menopause   . Syncope     Past Surgical History:  Procedure Laterality Date  . BACK SURGERY     cyst removal   . BREAST SURGERY     breast reduction  . BUNIONECTOMY    . CHOLECYSTECTOMY    . KNEE ARTHROSCOPY      Family History  Problem Relation Age of Onset  . Heart disease Mother   . Heart disease Brother   .  Heart disease Maternal Grandmother   . Heart disease Maternal Grandfather   . Cancer Son     unknown    Social history:  reports that she has never smoked. She has never used smokeless tobacco. She reports that she drinks alcohol. She reports that she does not use drugs.  Medications:  Prior to Admission medications   Medication Sig Start Date End Date Taking? Authorizing Provider  escitalopram (LEXAPRO) 10 MG tablet TAKE 3 TABLETS (30 MG TOTAL) BY MOUTH DAILY. 05/21/16  Yes Hoyt Koch, MD  LORazepam (ATIVAN) 0.5 MG tablet Take 1 tablet (0.5 mg total) by mouth  daily. Needs visit for any refills. 05/03/16  Yes Hoyt Koch, MD  omeprazole (PRILOSEC) 40 MG capsule TAKE 1 CAPSULE (40 MG TOTAL) BY MOUTH 2 (TWO) TIMES DAILY BEFORE A MEAL. 02/25/16  Yes Sharon Cole V, MD  topiramate (TOPAMAX) 25 MG tablet Take one tablet at night for one week, then take 2 tablets at night for one week, then take 3 tablets at night. 07/03/16   Sharon Ducking, MD     No Known Allergies  ROS:  Out of a complete 14 system review of symptoms, the patient complains only of the following symptoms, and all other reviewed systems are negative.  Headache, dizziness Anxiety Joint pain, joint swelling Vertigo Birthmarks, moles  Blood pressure 102/73, pulse 62, resp. rate 16, height 5' 4.5" (1.638 m), weight 198 lb (89.8 kg).  Physical Exam  General: The patient is alert and cooperative at the time of the examination.  Eyes: Pupils are equal, round, and reactive to light. Discs are flat bilaterally.  Ears: Tympanic membranes are clear.  Neck: The neck is supple, no carotid bruits are noted.  Respiratory: The respiratory examination is clear.  Cardiovascular: The cardiovascular examination reveals a regular rate and rhythm, no obvious murmurs or rubs are noted.  Skin: Extremities are without significant edema.  Neurologic Exam  Mental status: The patient is alert and oriented x 3 at the time of the examination. The patient has apparent normal recent and remote memory, with an apparently normal attention span and concentration ability.  Cranial nerves: Facial symmetry is present. There is good sensation of the face to pinprick and soft touch bilaterally. The strength of the facial muscles and the muscles to head turning and shoulder shrug are normal bilaterally. Speech is well enunciated, no aphasia or dysarthria is noted. Extraocular movements are full. Visual fields are full. The tongue is midline, and the patient has symmetric elevation of the soft  palate. No obvious hearing deficits are noted.  Motor: The motor testing reveals 5 over 5 strength of all 4 extremities. Good symmetric motor tone is noted throughout.  Sensory: Sensory testing is intact to pinprick, soft touch, vibration sensation, and position sense on all 4 extremities. No evidence of extinction is noted.  Coordination: Cerebellar testing reveals good finger-nose-finger and heel-to-shin bilaterally. The Nyan-Barrany procedure reveals some bilateral end-gaze nystagmus with the horizontal and rotatory component that is more prominent when looking to the left. The nystagmus does phase reverse. The patient does report some subjective dizziness with head position changes.  Gait and station: Gait is normal. Tandem gait is normal. Romberg is negative. No drift is seen.  Reflexes: Deep tendon reflexes are symmetric and normal bilaterally. Toes are downgoing bilaterally.   Assessment/Plan:  1. Headache, probable common migraine headache  2. Episodic vertigo  The patient does probably have migraine headache. It is not clear whether or not  the vertigo is associated with the migraine syndrome, but the patient has not responded to vestibular rehabilitation. The patient will be given a fair trial on Topamax, this was not done previously. The patient will be worked up to a 75 mg daily dose, she will need to be on the medication least 3 or 4 months. The patient will contact our office for any dose adjustments or any side effects on the medication. We will reassess the patient in 4 months.   Jill Alexanders MD 07/03/2016 8:09 AM  [facility] [address] [place], [place] [address]  Phone [phone] Fax [phone]

## 2016-07-03 NOTE — Patient Instructions (Signed)
   Topamax (topiramate) is a seizure medication that has an FDA approval for seizures and for migraine headache. Potential side effects of this medication include weight loss, cognitive slowing, tingling in the fingers and toes, and carbonated drinks will taste bad. If any significant side effects are noted on this drug, please contact our office.  

## 2016-07-09 ENCOUNTER — Ambulatory Visit (INDEPENDENT_AMBULATORY_CARE_PROVIDER_SITE_OTHER): Payer: 59 | Admitting: Psychology

## 2016-07-09 DIAGNOSIS — F332 Major depressive disorder, recurrent severe without psychotic features: Secondary | ICD-10-CM | POA: Diagnosis not present

## 2016-07-10 ENCOUNTER — Telehealth: Payer: Self-pay | Admitting: Emergency Medicine

## 2016-07-10 NOTE — Telephone Encounter (Signed)
Rec'd from [facility] forward 47 page to Dr. Malvin Johns

## 2016-07-23 ENCOUNTER — Ambulatory Visit (INDEPENDENT_AMBULATORY_CARE_PROVIDER_SITE_OTHER): Payer: 59 | Admitting: Psychology

## 2016-07-23 DIAGNOSIS — F332 Major depressive disorder, recurrent severe without psychotic features: Secondary | ICD-10-CM | POA: Diagnosis not present

## 2016-08-06 ENCOUNTER — Emergency Department (HOSPITAL_COMMUNITY): Payer: 59

## 2016-08-06 ENCOUNTER — Encounter (HOSPITAL_COMMUNITY): Payer: Self-pay | Admitting: Emergency Medicine

## 2016-08-06 ENCOUNTER — Emergency Department (HOSPITAL_COMMUNITY)
Admission: EM | Admit: 2016-08-06 | Discharge: 2016-08-06 | Disposition: A | Discharge status: Home / Self Care | Payer: 59 | Attending: Emergency Medicine | Admitting: Emergency Medicine

## 2016-08-06 ENCOUNTER — Ambulatory Visit: Payer: 59 | Admitting: Psychology

## 2016-08-06 DIAGNOSIS — R1013 Epigastric pain: Secondary | ICD-10-CM

## 2016-08-06 DIAGNOSIS — R160 Hepatomegaly, not elsewhere classified: Secondary | ICD-10-CM

## 2016-08-06 LAB — COMPREHENSIVE METABOLIC PANEL
ALT: 21 U/L (ref 14–54)
AST: 20 U/L (ref 15–41)
Albumin: 4.1 g/dL (ref 3.5–5.0)
Alkaline Phosphatase: 71 U/L (ref 38–126)
Anion gap: 8 (ref 5–15)
BUN: 11 mg/dL (ref 6–20)
CO2: 23 mmol/L (ref 22–32)
Calcium: 9.2 mg/dL (ref 8.9–10.3)
Chloride: 110 mmol/L (ref 101–111)
Creatinine, Ser: 0.74 mg/dL (ref 0.44–1.00)
GFR calc Af Amer: >60 mL/min (ref >60)
GFR calc non Af Amer: >60 mL/min (ref >60)
Glucose, Bld: 90 mg/dL (ref 65–99)
Potassium: 3.7 mmol/L (ref 3.5–5.1)
Sodium: 141 mmol/L (ref 135–145)
Total Bilirubin: 1 mg/dL (ref 0.3–1.2)
Total Protein: 6.3 g/dL — ABNORMAL LOW (ref 6.5–8.1)

## 2016-08-06 LAB — CBC
HCT: 39.1 % (ref 36.0–46.0)
Hemoglobin: 12.9 g/dL (ref 12.0–15.0)
MCH: 27.6 pg (ref 26.0–34.0)
MCHC: 33 g/dL (ref 30.0–36.0)
MCV: 83.7 fL (ref 78.0–100.0)
Platelets: 166 10*3/uL (ref 150–400)
RBC: 4.67 MIL/uL (ref 3.87–5.11)
RDW: 15 % (ref 11.5–15.5)
WBC: 5.3 10*3/uL (ref 4.0–10.5)

## 2016-08-06 LAB — LIPASE, BLOOD: Lipase: 13 U/L (ref 11–51)

## 2016-08-06 MED ORDER — IOPAMIDOL (ISOVUE-300) INJECTION 61%
INTRAVENOUS | Status: AC
  Administered 2016-08-06: 100 mL
  Filled 2016-08-06: qty 100

## 2016-08-06 NOTE — ED Notes (Signed)
She reports that was sent over by her PCP for her Nausea, bloating, distension, and constipation she reports taking stool softners but still feeling very constipated.

## 2016-08-06 NOTE — ED Notes (Signed)
ED Provider at bedside. 

## 2016-08-06 NOTE — ED Provider Notes (Signed)
[facility] DEPT Provider Note   CSN: [id number] Arrival date & time: 08/06/16  1202   By signing my name below, I, Soijett Blue, attest that this documentation has been prepared under the direction and in the presence of Pattricia Boss, MD. Electronically Signed: Soijett Blue, ED Scribe. 08/06/16. 2:26 PM.  History   Chief Complaint Chief Complaint  Patient presents with  . Abdominal Pain    HPI Sharon Cole is a 61 y.o. female who presents to the Emergency Department complaining of abdominal pain x cramping sensation onset 1 week ago. She notes that she has a hx of epigastric abdominal pain following multiple surgeries. She reports that she had a hepatadudoscopy 3 years ago for recurring bile stones and cholecystectomy completed 20 years ago. Pt reports associated constipation, abdominal bloating x 2 weeks, and nausea. She states that her last bowel movement was yesterday and was not typical for her. Pt has tried stool softeners with mild relief of her symptoms. She states that she was seen by her PCP earlier today and sent to the ED in order to rule out a possible bowel obstruction. She denies vomiting, fever, chills, and any other symptoms. Pt reports that her PCP is Dr. Kelton Pillar. Denies any pertinent PMHx, smoking cigarettes, or allergies to medications. Pt endorses occasional ETOH use. Pt takes daily ativan and lexapro.      The history is provided by the patient. No language interpreter was used.    Past Medical History:  Diagnosis Date  . Anxiety   . Bradycardia   . Common migraine with intractable migraine 07/03/2016  . Depression   . Dizziness   . Headache   . Menopause   . Syncope     Patient Active Problem List   Diagnosis Date Noted  . Common migraine with intractable migraine 07/03/2016  . Cough 08/08/2015  . Lung nodule 08/08/2015  . Acute URI 08/02/2015  . Abdominal pain, epigastric 02/13/2015  . Routine general medical examination at a health  care facility 01/02/2015  . Vertigo 12/15/2014  . Insomnia  chronic on Ativan for years 01/17/2014  . Bradycardia 01/14/2014  . Syncope 01/14/2014  . Anxiety 08/10/2013  .  Biliary calculi recurrent S/p [facility] 08/10/2013  . Depression 08/10/2013  . GERD (gastroesophageal reflux disease) 08/10/2013  . Menopause 08/10/2013  . Peptic ulcer disease  remote 08/10/2013    Past Surgical History:  Procedure Laterality Date  . BACK SURGERY     cyst removal   . BREAST SURGERY     breast reduction  . BUNIONECTOMY    . CHOLECYSTECTOMY    . KNEE ARTHROSCOPY      OB History    Gravida Para Term Preterm AB Living   4       2 2    SAB TAB Ectopic Multiple Live Births   2               Home Medications    Prior to Admission medications   Medication Sig Start Date End Date Taking? Authorizing Provider  docusate sodium (COLACE) 100 MG capsule Take 100 mg by mouth 2 (two) times daily.   Yes Historical Provider, MD  escitalopram (LEXAPRO) 20 MG tablet Take 30 mg by mouth daily. 07/09/16  Yes Historical Provider, MD  LORazepam (ATIVAN) 0.5 MG tablet Take 1 tablet (0.5 mg total) by mouth daily. Needs visit for any refills. 05/03/16  Yes Hoyt Koch, MD  omeprazole (PRILOSEC) 40 MG capsule TAKE 1 CAPSULE (40  MG TOTAL) BY MOUTH 2 (TWO) TIMES DAILY BEFORE A MEAL. Patient taking differently: TAKE 1 CAPSULE (40 MG TOTAL) BY DAILY BEFORE A MEAL. 02/25/16  Yes Aleksei Plotnikov V, MD  topiramate (TOPAMAX) 25 MG tablet Take one tablet at night for one week, then take 2 tablets at night for one week, then take 3 tablets at night. Patient taking differently: Take 75 mg by mouth at bedtime. Take one tablet at night for one week, then take 2 tablets at night for one week, then take 3 tablets at night. 07/03/16  Yes Kathrynn Ducking, MD    Family History Family History  Problem Relation Age of Onset  . Heart disease Mother   . Heart disease Brother   . Heart disease Maternal Grandmother    . Heart disease Maternal Grandfather   . Cancer Son     unknown    Social History Social History  Substance Use Topics  . Smoking status: Never Smoker  . Smokeless tobacco: Never Used  . Alcohol use 0.0 oz/week     Comment: daily     Allergies   Patient has no known allergies.   Review of Systems Review of Systems  Constitutional: Negative for chills and fever.  Gastrointestinal: Positive for abdominal distention, abdominal pain, constipation and nausea. Negative for vomiting.  All other systems reviewed and are negative.    Physical Exam Updated Vital Signs BP 102/72 (BP Location: Right Arm)   Pulse (!) 55   Temp 97.9 F (36.6 C) (Oral)   Resp 16   SpO2 99%   Physical Exam  Constitutional: She is oriented to person, place, and time. She appears well-developed and well-nourished. No distress.  HENT:  Head: Normocephalic and atraumatic.  Eyes: EOM are normal.  Neck: Neck supple.  Cardiovascular: Normal rate.   Pulmonary/Chest: Effort normal. No respiratory distress.  Abdominal: Soft. She exhibits no distension. There is tenderness in the right upper quadrant and epigastric area.  Mild tenderness to epigastrium and RUQ. Decreased bowel sounds. Abdomen soft.  Musculoskeletal: Normal range of motion.  Neurological: She is alert and oriented to person, place, and time.  Skin: Skin is warm and dry.  Psychiatric: She has a normal mood and affect. Her behavior is normal.  Nursing note and vitals reviewed.    ED Treatments / Results  DIAGNOSTIC STUDIES: Oxygen Saturation is 99% on RA, nl by my interpretation.    COORDINATION OF CARE: 2:23 PM Discussed treatment plan with pt at bedside which includes labs, UA, CT abdomen pelvis, and pt agreed to plan.   Labs (all labs ordered are listed, but only abnormal results are displayed) Labs Reviewed  COMPREHENSIVE METABOLIC PANEL - Abnormal; Notable for the following:       Result Value   Total Protein 6.3 (*)     All other components within normal limits  LIPASE, BLOOD  CBC  URINALYSIS, ROUTINE W REFLEX MICROSCOPIC    Radiology Ct Abdomen Pelvis W Contrast  Result Date: 08/06/2016 CLINICAL DATA:  61 year old female with upper abdominal pain and bloating with constipation for 2 weeks. Prior cholecystectomy. Initial encounter. EXAM: CT ABDOMEN AND PELVIS WITH CONTRAST TECHNIQUE: Multidetector CT imaging of the abdomen and pelvis was performed using the standard protocol following bolus administration of intravenous contrast. CONTRAST:  162mL ISOVUE-300 IOPAMIDOL (ISOVUE-300) INJECTION 61% COMPARISON:  No comparison CT of the abdomen and pelvis. Prior chest CT 06/17/2016. Prior abdominal sonogram 02/20/2015. FINDINGS: Lower chest: Minimal basilar atelectasis/ scarring. Heart size within normal limits. Hepatobiliary:  Enlarged liver spanning over 19.3 cm. Post cholecystectomy. Patient has undergone small bowel anastomosis with biliary duct. Pneumobilia subsequently noted. Pancreas: No pancreatic mass or inflammation. Spleen: No mass or enlargement. Adrenals/Urinary Tract: No renal or ureteral obstructing stone or evidence hydronephrosis. No renal or adrenal mass. Noncontrast filled views of the urinary bladder unremarkable. Stomach/Bowel: Stomach under distended without gross abnormality noted. Portions of colon under distended. No extraluminal bowel inflammatory process, free fluid or free air. Vascular/Lymphatic: No aortic aneurysm or large vessel occlusion. No adenopathy. Reproductive: Retroverted uterus.  No worrisome adnexal mass. Other: No bowel containing hernia. Musculoskeletal: Mild degenerative changes thoracic. Right hip joint degenerative changes. IMPRESSION: Enlarged liver spanning over 19.3 cm. Post cholecystectomy. Patient has undergone small bowel anastomosis with biliary duct. Pneumobilia subsequently noted. No extraluminal bowel inflammatory process. Electronically Signed   By: Genia Del M.D.   On:  08/06/2016 16:14    Procedures Procedures (including critical care time)  Medications Ordered in ED Medications - No data to display   Initial Impression / Assessment and Plan / ED Course  I have reviewed the triage vital signs and the nursing notes.  Pertinent labs & imaging results that were available during my care of the patient were reviewed by me and considered in my medical decision making (see chart for details).     No evidence of acute abnormality labs or ct.  Patient counseled advised re f/u and return precautions.   Final Clinical Impressions(s) / ED Diagnoses   Final diagnoses:  Epigastric pain  Hepatomegaly    New Prescriptions New Prescriptions   No medications on file   I personally performed the services described in this documentation, which was scribed in my presence. The recorded information has been reviewed and considered.    Pattricia Boss, MD 08/06/16 [phone]

## 2016-08-06 NOTE — ED Notes (Signed)
EDP discussed with pt CT findings and need to follow up with GI. Pt verbalizes DC teaching and need to follow up with GI. NAD.

## 2016-08-06 NOTE — ED Triage Notes (Signed)
Pt here for abd pain and distention; pt sent for eval of possible bowel obstruction

## 2016-08-06 NOTE — Discharge Instructions (Signed)
Your labs are normal today. There is no evidence of acute abnormality on ct scan. Your liver is somewhat enlarged- please follow up with gastroenterology.

## 2016-08-07 ENCOUNTER — Other Ambulatory Visit: Payer: Self-pay | Admitting: Internal Medicine

## 2016-08-20 ENCOUNTER — Ambulatory Visit: Payer: 59 | Admitting: Psychology

## 2016-08-21 ENCOUNTER — Encounter: Payer: Self-pay | Admitting: Neurology

## 2016-09-03 ENCOUNTER — Ambulatory Visit (INDEPENDENT_AMBULATORY_CARE_PROVIDER_SITE_OTHER): Payer: 59 | Admitting: Psychology

## 2016-09-03 ENCOUNTER — Other Ambulatory Visit: Payer: Self-pay | Admitting: Internal Medicine

## 2016-09-03 DIAGNOSIS — F332 Major depressive disorder, recurrent severe without psychotic features: Secondary | ICD-10-CM | POA: Diagnosis not present

## 2016-09-06 ENCOUNTER — Other Ambulatory Visit: Payer: Self-pay | Admitting: Internal Medicine

## 2016-09-11 ENCOUNTER — Ambulatory Visit (INDEPENDENT_AMBULATORY_CARE_PROVIDER_SITE_OTHER): Payer: 59 | Admitting: Neurology

## 2016-09-11 ENCOUNTER — Encounter: Payer: Self-pay | Admitting: Neurology

## 2016-09-11 VITALS — BP 101/67 | HR 56 | Ht 64.5 in | Wt 194.5 lb

## 2016-09-11 DIAGNOSIS — G43019 Migraine without aura, intractable, without status migrainosus: Secondary | ICD-10-CM

## 2016-09-11 DIAGNOSIS — R42 Dizziness and giddiness: Secondary | ICD-10-CM

## 2016-09-11 NOTE — Patient Instructions (Addendum)
   Reduce the Topamax to 50 mg at night for 3 weeks, and if you are still feeling poorly, reduce to 25 mg at night.

## 2016-09-11 NOTE — Progress Notes (Signed)
Reason for visit: Migraine headache  Sharon Cole is an 61 y.o. female  History of present illness:  Sharon Cole is a 61 year old right-handed white female with a history of migraine headache also associated with episodes of vertigo. The patient has been on Topamax 75 mg at night with good improvement in her headaches, her headaches are now rare and her vertigo has disappeared. The patient however does not believe that she can tolerate Topamax that she believes that this is worsening her arthritis pain. The patient has lost some weight on Topamax. She does have some underlying problems with depression and anxiety. She wishes to come down off of the Topamax dose because of the arthralgias that she is experiencing.  Past Medical History:  Diagnosis Date  . Anxiety   . Bradycardia   . Common migraine with intractable migraine 07/03/2016  . Depression   . Dizziness   . Headache   . Menopause   . Syncope     Past Surgical History:  Procedure Laterality Date  . BACK SURGERY     cyst removal   . BREAST SURGERY     breast reduction  . BUNIONECTOMY    . CHOLECYSTECTOMY    . KNEE ARTHROSCOPY      Family History  Problem Relation Age of Onset  . Heart disease Mother   . Heart disease Brother   . Heart disease Maternal Grandmother   . Heart disease Maternal Grandfather   . Cancer Son     unknown    Social history:  reports that she has never smoked. She has never used smokeless tobacco. She reports that she drinks alcohol. She reports that she does not use drugs.   No Known Allergies  Medications:  Prior to Admission medications   Medication Sig Start Date End Date Taking? Authorizing Provider  docusate sodium (COLACE) 100 MG capsule Take 100 mg by mouth daily as needed.    Yes [provider]  escitalopram (LEXAPRO) 20 MG tablet Take 30 mg by mouth daily. 07/09/16  Yes [provider]  LORazepam (ATIVAN) 0.5 MG tablet Take 1 tablet (0.5 mg total) by  mouth daily. Needs visit for any refills. 05/03/16  Yes Hoyt Koch, MD  MAGNESIUM PO Take 1 tablet by mouth daily.   Yes [provider]  Multiple Vitamins-Minerals (MULTIVITAMIN PO) Take 1 tablet by mouth daily.   Yes [provider]  omeprazole (PRILOSEC) 40 MG capsule TAKE 1 CAPSULE (40 MG TOTAL) BY MOUTH 2 (TWO) TIMES DAILY BEFORE A MEAL. Patient taking differently: TAKE 1 CAPSULE (40 MG TOTAL) BY DAILY BEFORE A MEAL. 02/25/16  Yes Plotnikov, Evie Lacks, MD  topiramate (TOPAMAX) 25 MG tablet Take one tablet at night for one week, then take 2 tablets at night for one week, then take 3 tablets at night. Patient taking differently: Take 75 mg by mouth at bedtime. Take one tablet at night for one week, then take 2 tablets at night for one week, then take 3 tablets at night. 07/03/16  Yes Kathrynn Ducking, MD  Turmeric 450 MG CAPS Take 1 capsule by mouth daily.   Yes [provider]    ROS:  Out of a complete 14 system review of symptoms, the patient complains only of the following symptoms, and all other reviewed systems are negative.  Cold intolerance Abdominal pain, constipation Daytime sleepiness Joint pain, back pain, achy muscles Decreased concentration, anxiety  Blood pressure 101/67, pulse (!) 56, height 5' 4.5" (  1.638 m), weight 194 lb 8 oz (88.2 kg).  Physical Exam  General: The patient is alert and cooperative at the time of the examination. The patient is moderately obese.  Skin: No significant peripheral edema is noted.   Neurologic Exam  Mental status: The patient is alert and oriented x 3 at the time of the examination. The patient has apparent normal recent and remote memory, with an apparently normal attention span and concentration ability.   Cranial nerves: Facial symmetry is present. Speech is normal, no aphasia or dysarthria is noted. Extraocular movements are full. Visual fields are full.  Motor: The patient has good  strength in all 4 extremities.  Sensory examination: Soft touch sensation is symmetric on the face, arms, and legs.  Coordination: The patient has good finger-nose-finger and heel-to-shin bilaterally.  Gait and station: The patient has a normal gait. Tandem gait is normal. Romberg is negative. No drift is seen.  Reflexes: Deep tendon reflexes are symmetric.   Assessment/Plan:  1. Migraine headache  The patient is on quite well on Topamax, but she believes that this is causing arthralgias. We will taper down to 50 mg at night for 3 weeks, and then go to 25 mg night if she is still feeling poorly. If she cannot tolerate the Topamax even in low dose, we will stop the medication and start Zonegran. She will follow-up in 6 months.   Jill Alexanders MD 09/11/2016 3:50 PM  [facility] [address] [place], [place] [address]  Phone [phone] Fax [phone]

## 2016-09-17 ENCOUNTER — Ambulatory Visit (INDEPENDENT_AMBULATORY_CARE_PROVIDER_SITE_OTHER): Payer: 59 | Admitting: Psychology

## 2016-09-17 DIAGNOSIS — F332 Major depressive disorder, recurrent severe without psychotic features: Secondary | ICD-10-CM | POA: Diagnosis not present

## 2016-10-01 ENCOUNTER — Ambulatory Visit (INDEPENDENT_AMBULATORY_CARE_PROVIDER_SITE_OTHER): Payer: 59 | Admitting: Psychology

## 2016-10-01 DIAGNOSIS — F332 Major depressive disorder, recurrent severe without psychotic features: Secondary | ICD-10-CM | POA: Diagnosis not present

## 2016-10-15 ENCOUNTER — Ambulatory Visit (INDEPENDENT_AMBULATORY_CARE_PROVIDER_SITE_OTHER): Payer: 59 | Admitting: Psychology

## 2016-10-15 DIAGNOSIS — F332 Major depressive disorder, recurrent severe without psychotic features: Secondary | ICD-10-CM

## 2016-10-29 ENCOUNTER — Ambulatory Visit: Payer: 59 | Admitting: Psychology

## 2016-10-31 ENCOUNTER — Ambulatory Visit: Payer: 59 | Admitting: Neurology

## 2016-11-01 ENCOUNTER — Ambulatory Visit (INDEPENDENT_AMBULATORY_CARE_PROVIDER_SITE_OTHER): Payer: 59 | Admitting: Emergency Medicine

## 2016-11-01 ENCOUNTER — Encounter: Payer: Self-pay | Admitting: Emergency Medicine

## 2016-11-01 DIAGNOSIS — R911 Solitary pulmonary nodule: Secondary | ICD-10-CM

## 2016-11-01 NOTE — Assessment & Plan Note (Signed)
Right upper lobe nodule. I have reviewed the reports from her chest x-rays from [place] from 2009 2010. She had a right upper lobe nodule at that time, never had a CT scan of the chest. 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 She is a low risk patient so we will follow annually, next scan to be done in February 2019.

## 2016-11-01 NOTE — Patient Instructions (Signed)
We will plan to repeat your CT scan of the chest in February 2019 to compare with your prior.  Follow with Dr Lamonte Sakai in Feb 2019 to review those results

## 2016-11-01 NOTE — Progress Notes (Signed)
Subjective:    Patient ID: Sharon Cole, female    DOB: [birth year], 61 y.o.   MRN: [id number]  HPI 61 yo never smoker with a hx of anxiety, depression, hx biliary stones. She underwent a CT chest on 06/17/16 to evaluate a RUL nodule seen on CXR that was done March 2017 to evaluate a URI. This showed a 1.6 x 0.8cm RUL nodule, ? Some evolving Ca. In retrospect she knows that she was told that she had a nodule since 4 years ago while in [place]. She can't recall a CT scan in [place], only CXR's.                                                                                                                                            ROV 11/01/16 -- This follow-up visit to review CT scan of the chest following a right upper lobe nodule. 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   Review of Systems  Constitutional: Negative.  Negative for fever and unexpected weight change.  HENT: Negative.  Negative for congestion, dental problem, ear pain, nosebleeds, postnasal drip, rhinorrhea, sinus pressure, sneezing, sore throat and trouble swallowing.   Eyes: Negative.  Negative for redness and itching.  Respiratory: Negative.  Negative for cough, chest tightness, shortness of breath and wheezing.   Cardiovascular: Negative.  Negative for palpitations and leg swelling.  Gastrointestinal: Negative.  Negative for nausea and vomiting.  Endocrine: Negative.   Genitourinary: Negative.  Negative for dysuria.  Musculoskeletal: Positive for joint swelling.  Skin: Negative.  Negative for rash.  Allergic/Immunologic: Negative.   Neurological: Positive for dizziness and headaches.  Hematological: Does not bruise/bleed easily.  Psychiatric/Behavioral: Negative.  Negative for dysphoric mood. The patient is not nervous/anxious.     Past Medical History:  Diagnosis Date  . Anxiety   . Bradycardia   . Common migraine with intractable migraine 07/03/2016  . Depression   .  Dizziness   . Headache   . Menopause   . Syncope      Family History  Problem Relation Age of Onset  . Heart disease Mother   . Heart disease Brother   . Heart disease Maternal Grandmother   . Heart disease Maternal Grandfather   . Cancer Son        unknown     Social History   Social History  . Marital status: Significant Other    Spouse name: N/A  . Number of children: 2  . Years of education: Masters   Occupational History  . Not on file.   Social History Main Topics  . Smoking status: Never Smoker  . Smokeless tobacco: Never Used  . Alcohol use 0.0 oz/week     Comment: daily  . Drug use: No  . Sexual activity: Yes    Partners: Male   Other Topics  Concern  . Not on file   Social History Narrative   Lives   Caffeine use:    Drinks 16oz caffeine drinks a day    She has lived in [place], [place], [place] She has done Engineer, production, usually used a mask.   No Known Allergies   Outpatient Medications Prior to Visit  Medication Sig Dispense Refill  . docusate sodium (COLACE) 100 MG capsule Take 100 mg by mouth daily as needed.     Marland Kitchen escitalopram (LEXAPRO) 20 MG tablet Take 30 mg by mouth daily.    Marland Kitchen LORazepam (ATIVAN) 0.5 MG tablet Take 1 tablet (0.5 mg total) by mouth daily. Needs visit for any refills. 30 tablet 0  . MAGNESIUM PO Take 1 tablet by mouth daily.    . Multiple Vitamins-Minerals (MULTIVITAMIN PO) Take 1 tablet by mouth daily.    Marland Kitchen omeprazole (PRILOSEC) 40 MG capsule TAKE 1 CAPSULE (40 MG TOTAL) BY MOUTH 2 (TWO) TIMES DAILY BEFORE A MEAL. (Patient taking differently: TAKE 1 CAPSULE (40 MG TOTAL) BY DAILY BEFORE A MEAL.) 60 capsule 3  . topiramate (TOPAMAX) 25 MG tablet Take one tablet at night for one week, then take 2 tablets at night for one week, then take 3 tablets at night. (Patient taking differently: Take 75 mg by mouth at bedtime. Take one tablet at night for one week, then take 2 tablets at night for one week, then take 3 tablets at night.) 90  tablet 3  . Turmeric 450 MG CAPS Take 1 capsule by mouth daily.     No facility-administered medications prior to visit.         Objective:   Physical Exam Vitals:   11/01/16 1121 11/01/16 1122  BP:  90/64  Pulse:  (!) 55  SpO2:  100%  Weight: 190 lb (86.2 kg)   Height: 5' 4.5" (1.638 m)   Gen: Pleasant, well-nourished, in no distress,  normal affect  ENT: No lesions,  mouth clear,  oropharynx clear, no postnasal drip  Neck: No JVD, no TMG, no carotid bruits  Lungs: No use of accessory muscles, clear without rales or rhonchi  Cardiovascular: RRR, heart sounds normal, no murmur or gallops, no peripheral edema  Musculoskeletal: No deformities, no cyanosis or clubbing  Neuro: alert, non focal  Skin: Warm, no lesions or rashes  06/17/16 --  FINDINGS: Cardiovascular: Normal heart size.  No pericardial effusion.  Mediastinum/Nodes: 1 cm nodule in the with right thyroid, usually incidental at this size.  Negative for adenopathy.  Lungs/Pleura: Confirmed right upper lobe nodule with the elongated lobulated morphology measuring 16 x 8 mm. No internal fat density is noted. There is posterior high density components, although not reaching threshold for definite calcification. Given the shape, this could reflect dilated peripheral bronchiole, but no branching morphology typical for this entity. There is extension towards the pleura without pleural distortion. Other nodular densities are subpleural and likely lymph nodes. No consolidation, emphysema, or airway thickening.  Upper Abdomen: Postoperative abdomen with pneumobilia  Musculoskeletal: No acute or aggressive finding.  IMPRESSION: 16 x 8 mm right upper lobe pulmonary nodule as described above. Consider one of the following in 3 months for both low-risk and high-risk individuals: (a) repeat chest CT, (b) follow-up PET-CT, or (c) tissue sampling. This recommendation follows the consensus statement: Guidelines  for Management of Incidental Pulmonary Nodules Detected on CT Images: From the Fleischner Society 2017; Radiology 2017; 284:228-243.       Assessment & Plan:  Lung nodule Right  upper lobe nodule. I have reviewed the reports from her chest x-rays from [place] from 2009 2010. She had a right upper lobe nodule at that time, never had a CT scan of the chest. 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 She is a low risk patient so we will follow annually, next scan to be done in February 2019.  Baltazar Apo, MD, PhD 11/01/2016, 11:49 AM [facility] [phone] or if no answer [phone]

## 2016-11-04 ENCOUNTER — Other Ambulatory Visit: Payer: Self-pay | Admitting: Neurology

## 2016-11-12 ENCOUNTER — Ambulatory Visit (INDEPENDENT_AMBULATORY_CARE_PROVIDER_SITE_OTHER): Payer: 59 | Admitting: Psychology

## 2016-11-12 DIAGNOSIS — F332 Major depressive disorder, recurrent severe without psychotic features: Secondary | ICD-10-CM

## 2016-11-14 ENCOUNTER — Telehealth: Payer: Self-pay | Admitting: Neurology

## 2016-11-14 MED ORDER — TOPIRAMATE 25 MG PO TABS: 75.0000 mg | ORAL_TABLET | Freq: Every day | ORAL | 1 refills | Status: DC

## 2016-11-14 NOTE — Telephone Encounter (Signed)
Pt called office back. She stated she went down to 50mg  at night of the topamax but headache symptoms started to come back. She then increased dose back to 75mg  at night and has been doing well. She stated the arthralgias have resolved and does not think it was the topamax. She is doing well on medication, no SE.   She would like new rx called in for 75mg  at night sent to [id number] - [place], [place] [address].   Advised I will give Dr Jannifer Franklin the message to review/approve sending refill. We will not call back unless we have further questions for her, or cannot send rx as requested. She verbalized understanding.

## 2016-11-14 NOTE — Telephone Encounter (Signed)
A prescription was given for the Topamax to take 75 mg at night.

## 2016-11-14 NOTE — Telephone Encounter (Signed)
Patient called office in reference to topiramate (TOPAMAX) 25 MG tablet.  Patient states she picked up medication on 11/04/16 with 30 tablets.  Patient says she has been taking 3 tablets at bedtime and will be out of the medication.  She would like a new script with 90 tablets and new instructions of 3 tablets at bedtime.  Pharmacy-  [facility] [place].  Please call

## 2016-11-14 NOTE — Addendum Note (Signed)
Addended by: Kathrynn Ducking on: 11/14/2016 11:55 AM   Modules accepted: Orders

## 2016-11-14 NOTE — Telephone Encounter (Signed)
Called and LVM for pt to call and discuss further. As last visit, CW,MD was tapering her down to 50mg  at night for 3 weeks and then down to 25mg  per night. This was the dose sent in on 11/04/16. Wanted to make sure she was tolerating 3 tablets at night okay, no SE.

## 2016-11-26 ENCOUNTER — Ambulatory Visit (INDEPENDENT_AMBULATORY_CARE_PROVIDER_SITE_OTHER): Payer: 59 | Admitting: Psychology

## 2016-11-26 DIAGNOSIS — F332 Major depressive disorder, recurrent severe without psychotic features: Secondary | ICD-10-CM

## 2016-12-10 ENCOUNTER — Other Ambulatory Visit: Payer: Self-pay | Admitting: Internal Medicine

## 2016-12-10 ENCOUNTER — Ambulatory Visit (INDEPENDENT_AMBULATORY_CARE_PROVIDER_SITE_OTHER): Payer: 59 | Admitting: Psychology

## 2016-12-10 DIAGNOSIS — F332 Major depressive disorder, recurrent severe without psychotic features: Secondary | ICD-10-CM

## 2016-12-18 ENCOUNTER — Other Ambulatory Visit: Payer: Self-pay | Admitting: Internal Medicine

## 2016-12-24 ENCOUNTER — Ambulatory Visit (INDEPENDENT_AMBULATORY_CARE_PROVIDER_SITE_OTHER): Payer: 59 | Admitting: Psychology

## 2016-12-24 DIAGNOSIS — F332 Major depressive disorder, recurrent severe without psychotic features: Secondary | ICD-10-CM | POA: Diagnosis not present

## 2016-12-25 ENCOUNTER — Other Ambulatory Visit: Payer: Self-pay | Admitting: Internal Medicine

## 2017-01-07 ENCOUNTER — Ambulatory Visit (INDEPENDENT_AMBULATORY_CARE_PROVIDER_SITE_OTHER): Payer: 59 | Admitting: Psychology

## 2017-01-07 DIAGNOSIS — F332 Major depressive disorder, recurrent severe without psychotic features: Secondary | ICD-10-CM | POA: Diagnosis not present

## 2017-02-04 ENCOUNTER — Ambulatory Visit (INDEPENDENT_AMBULATORY_CARE_PROVIDER_SITE_OTHER): Payer: Self-pay | Admitting: Psychology

## 2017-02-04 DIAGNOSIS — F332 Major depressive disorder, recurrent severe without psychotic features: Secondary | ICD-10-CM

## 2017-02-18 ENCOUNTER — Ambulatory Visit: Payer: Self-pay | Admitting: Psychology

## 2017-03-04 ENCOUNTER — Ambulatory Visit: Payer: Self-pay | Admitting: Psychology

## 2017-03-06 ENCOUNTER — Ambulatory Visit (INDEPENDENT_AMBULATORY_CARE_PROVIDER_SITE_OTHER): Payer: BLUE CROSS/BLUE SHIELD | Admitting: Psychology

## 2017-03-06 DIAGNOSIS — F332 Major depressive disorder, recurrent severe without psychotic features: Secondary | ICD-10-CM

## 2017-03-17 ENCOUNTER — Telehealth: Payer: Self-pay | Admitting: Neurology

## 2017-03-17 ENCOUNTER — Ambulatory Visit: Payer: 59 | Admitting: Adult Health

## 2017-03-17 NOTE — Telephone Encounter (Signed)
Pt called to c/a appt today. She said her father died on 08-26-2022 and the funeral is today. FYI

## 2017-03-18 ENCOUNTER — Ambulatory Visit (INDEPENDENT_AMBULATORY_CARE_PROVIDER_SITE_OTHER): Payer: BLUE CROSS/BLUE SHIELD | Admitting: Psychology

## 2017-03-18 DIAGNOSIS — F332 Major depressive disorder, recurrent severe without psychotic features: Secondary | ICD-10-CM | POA: Diagnosis not present

## 2017-04-01 ENCOUNTER — Ambulatory Visit: Payer: BLUE CROSS/BLUE SHIELD | Admitting: Psychology

## 2017-04-01 DIAGNOSIS — F332 Major depressive disorder, recurrent severe without psychotic features: Secondary | ICD-10-CM | POA: Diagnosis not present

## 2017-04-15 ENCOUNTER — Ambulatory Visit: Payer: Self-pay | Admitting: Psychology

## 2017-04-21 ENCOUNTER — Telehealth: Payer: Self-pay | Admitting: *Deleted

## 2017-04-21 NOTE — Telephone Encounter (Signed)
LVM letting pt know Dr. Jannifer Franklin had cx today at 4pm and was going to offer appt. Since unable to reach, advised her to keep appt for next week.

## 2017-04-24 ENCOUNTER — Encounter: Payer: Self-pay | Admitting: Neurology

## 2017-04-24 ENCOUNTER — Ambulatory Visit: Payer: BLUE CROSS/BLUE SHIELD | Admitting: Neurology

## 2017-04-24 ENCOUNTER — Telehealth: Payer: Self-pay | Admitting: *Deleted

## 2017-04-24 VITALS — BP 95/60 | HR 70 | Ht 64.5 in | Wt 189.0 lb

## 2017-04-24 DIAGNOSIS — Z760 Encounter for issue of repeat prescription: Secondary | ICD-10-CM

## 2017-04-24 DIAGNOSIS — G43019 Migraine without aura, intractable, without status migrainosus: Secondary | ICD-10-CM

## 2017-04-24 MED ORDER — TOPIRAMATE 50 MG PO TABS: 150.0000 mg | ORAL_TABLET | Freq: Every day | ORAL | 5 refills | Status: DC

## 2017-04-24 MED ORDER — LORAZEPAM 0.5 MG PO TABS: 0.5000 mg | ORAL_TABLET | Freq: Every day | ORAL | 5 refills | Status: DC

## 2017-04-24 NOTE — Telephone Encounter (Signed)
Called and spoke with pt. Offered appt today at 230pm. She was on wait list. She needs to call boyfriend to make sure she has a car to come to appt. She will call right back to let us know if she can take appt.   Please schedule her today at 230pm if she can take that appt on hold, thank you

## 2017-04-24 NOTE — Telephone Encounter (Signed)
Pt called office back. She agreed to appt today at 230pm. She was scheduled by phone staff.

## 2017-04-24 NOTE — Patient Instructions (Signed)
   With the Topamax, start 100 mg at night for at least 2 weeks, then go to 150 mg at night if the headaches remain frequent and you are tolerating the medication well.

## 2017-04-24 NOTE — Progress Notes (Signed)
Reason for visit: Migraine headache  Sharon Cole is an 61 y.o. female  History of present illness:  Sharon Cole is a 61 year old right-handed white female with a history of migraine headaches.  The patient has done quite well with the Topamax on the 75 mg dosing, she however has been under more stress over the last 3 months, and her headaches have become more frequent.  She is now having daily headaches, the vertigo is returning again.  She is sleeping fairly well at night, but she requires Ativan to get to sleep.  The patient returns to the office today for further evaluation.  Past Medical History:  Diagnosis Date  . Anxiety   . Bradycardia   . Common migraine with intractable migraine 07/03/2016  . Depression   . Dizziness   . Headache   . Menopause   . Syncope     Past Surgical History:  Procedure Laterality Date  . BACK SURGERY     cyst removal   . BREAST SURGERY     breast reduction  . BUNIONECTOMY    . CHOLECYSTECTOMY    . KNEE ARTHROSCOPY      Family History  Problem Relation Age of Onset  . Heart disease Mother   . Heart disease Brother   . Heart disease Maternal Grandmother   . Heart disease Maternal Grandfather   . Cancer Son        unknown    Social history:  reports that  has never smoked. she has never used smokeless tobacco. She reports that she drinks alcohol. She reports that she does not use drugs.   No Known Allergies  Medications:  Prior to Admission medications   Medication Sig Start Date End Date Taking? Authorizing Provider  docusate sodium (COLACE) 100 MG capsule Take 100 mg by mouth daily as needed.    Yes [provider]  escitalopram (LEXAPRO) 20 MG tablet Take 30 mg by mouth daily. 07/09/16  Yes [provider]  LORazepam (ATIVAN) 0.5 MG tablet Take 1 tablet (0.5 mg total) by mouth daily. Needs visit for any refills. 05/03/16  Yes Hoyt Koch, MD  MAGNESIUM PO Take 1 tablet by mouth daily.   Yes  [provider]  Multiple Vitamins-Minerals (MULTIVITAMIN PO) Take 1 tablet by mouth daily.   Yes [provider]  omeprazole (PRILOSEC) 40 MG capsule TAKE 1 CAPSULE (40 MG TOTAL) BY MOUTH 2 (TWO) TIMES DAILY BEFORE A MEAL. Patient taking differently: TAKE 1 CAPSULE (40 MG TOTAL) BY DAILY BEFORE A MEAL. 02/25/16  Yes Plotnikov, Evie Lacks, MD  topiramate (TOPAMAX) 25 MG tablet Take 3 tablets (75 mg total) by mouth at bedtime. 11/14/16  Yes Kathrynn Ducking, MD  Turmeric 450 MG CAPS Take 1 capsule by mouth daily.   Yes [provider]    ROS:  Out of a complete 14 system review of symptoms, the patient complains only of the following symptoms, and all other reviewed systems are negative.  Nausea Dizziness, headache Anxiety  Blood pressure 95/60, pulse 70, height 5' 4.5" (1.638 m), weight 189 lb (85.7 kg), SpO2 98 %.  Physical Exam  General: The patient is alert and cooperative at the time of the examination.  Skin: No significant peripheral edema is noted.   Neurologic Exam  Mental status: The patient is alert and oriented x 3 at the time of the examination. The patient has apparent normal recent and remote memory, with an apparently normal attention span  and concentration ability.   Cranial nerves: Facial symmetry is present. Speech is normal, no aphasia or dysarthria is noted. Extraocular movements are full. Visual fields are full.  Motor: The patient has good strength in all 4 extremities.  Sensory examination: Soft touch sensation is symmetric on the face, arms, and legs.  Coordination: The patient has good finger-nose-finger and heel-to-shin bilaterally.  Gait and station: The patient has a normal gait. Tandem gait is slightly unsteady. Romberg is negative. No drift is seen.  Reflexes: Deep tendon reflexes are symmetric.   Assessment/Plan:  1.  Migraine headaches  The patient has had worsening of the headache, but she is seems to be  tolerating the Topamax well at this time.  We will go to 100 mg at night for 2 weeks and if she is still having frequent headaches, she will go to 150 mg at night.  The 50 mg Topamax tablets were sent in.  The patient will follow-up in 4 months or so.  Jill Alexanders MD 04/24/2017 2:30 PM  [facility] [address] [place], [place] [address]  Phone [phone] Fax [phone]

## 2017-04-30 ENCOUNTER — Ambulatory Visit: Payer: Self-pay | Admitting: Neurology

## 2017-05-13 ENCOUNTER — Telehealth: Payer: Self-pay | Admitting: Neurology

## 2017-05-13 ENCOUNTER — Ambulatory Visit (INDEPENDENT_AMBULATORY_CARE_PROVIDER_SITE_OTHER): Payer: BLUE CROSS/BLUE SHIELD | Admitting: Psychology

## 2017-05-13 DIAGNOSIS — F332 Major depressive disorder, recurrent severe without psychotic features: Secondary | ICD-10-CM | POA: Diagnosis not present

## 2017-05-13 NOTE — Telephone Encounter (Signed)
Patient is calling to discuss dosage of topiramate (TOPAMAX) 50 MG tablet.

## 2017-05-13 NOTE — Telephone Encounter (Signed)
Called pt back. She wanted to verify she was titrating up on topamax correctly. She misplaced AVS.  Advised per CW,MD last OV note: "With the Topamax, start 100 mg at night for at least 2 weeks, then go to 150 mg at night if the headaches remain frequent and you are tolerating the medication well." She verbalized understanding and is taking 150mg  daily. She has been taking this for two days.  She will call back if she has further questions/concerns.

## 2017-05-27 ENCOUNTER — Ambulatory Visit: Payer: Self-pay | Admitting: Psychology

## 2017-06-10 ENCOUNTER — Ambulatory Visit: Payer: Self-pay | Admitting: Psychology

## 2017-06-24 ENCOUNTER — Ambulatory Visit: Payer: BLUE CROSS/BLUE SHIELD | Admitting: Psychology

## 2017-06-24 DIAGNOSIS — F332 Major depressive disorder, recurrent severe without psychotic features: Secondary | ICD-10-CM | POA: Diagnosis not present

## 2017-07-08 ENCOUNTER — Ambulatory Visit: Payer: BLUE CROSS/BLUE SHIELD | Admitting: Psychology

## 2017-07-08 DIAGNOSIS — F332 Major depressive disorder, recurrent severe without psychotic features: Secondary | ICD-10-CM | POA: Diagnosis not present

## 2017-07-21 IMAGING — CT CT ABD-PELV W/ CM
2 of 5 series · 9 of 46 positions shown, 10 images · IV contrast (Iodine)
Comparison: No comparison CT of the abdomen and pelvis. Prior chest
CT 06/17/2016. Prior abdominal sonogram 02/20/2015.

CLINICAL DATA: 60-year-old female with upper abdominal pain and
bloating with constipation for 2 weeks. Prior cholecystectomy.
Initial encounter.

EXAM:
CT ABDOMEN AND PELVIS WITH CONTRAST
TECHNIQUE: Multidetector CT imaging of the abdomen and pelvis was performed
using the standard protocol following bolus administration of
intravenous contrast.
CONTRAST:  100mL M7RQMT-UXX IOPAMIDOL (M7RQMT-UXX) INJECTION 61%

[Series 201: routine, idose (2) · axial · 0.98mm/px · z∈[+338,+693]mm · 6 of 93 slices shown, 7 images]
[im 11/93  soft-tissue]
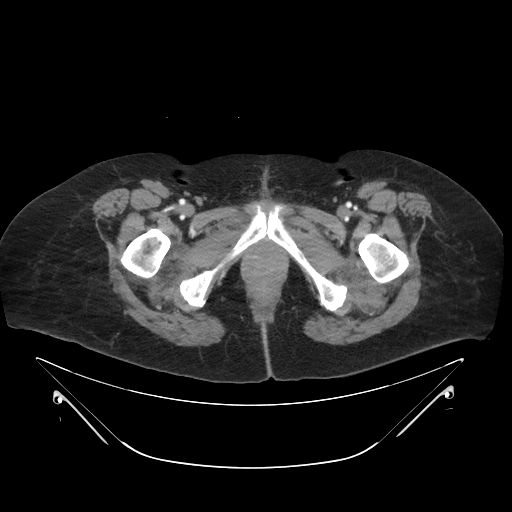
[im 11/93  bone]
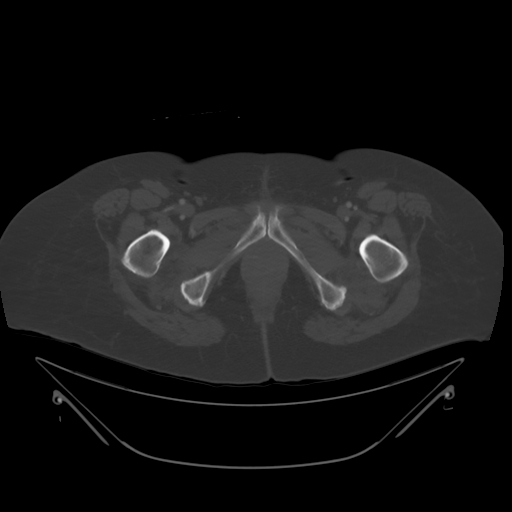
[im 26/93  soft-tissue]
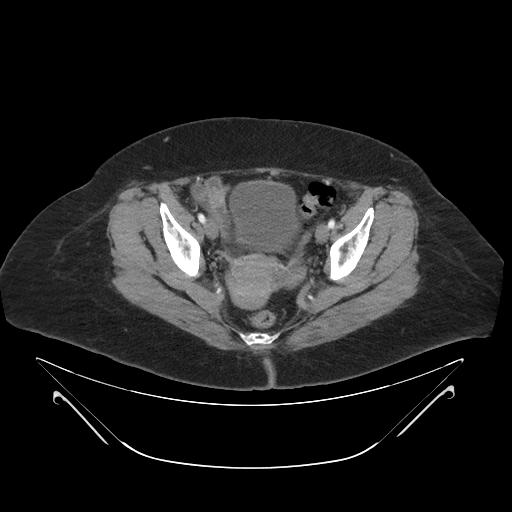
[im 41/93  soft-tissue]
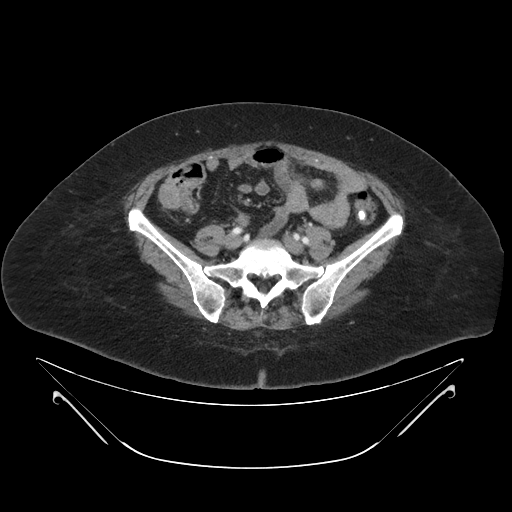
[im 52/93  soft-tissue]
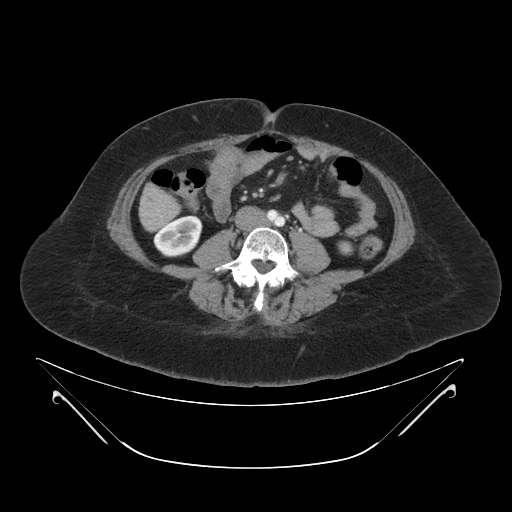
[im 67/93  soft-tissue]
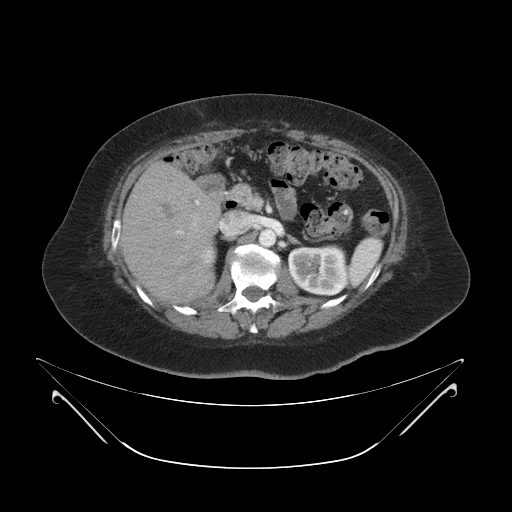
[im 82/93  soft-tissue]
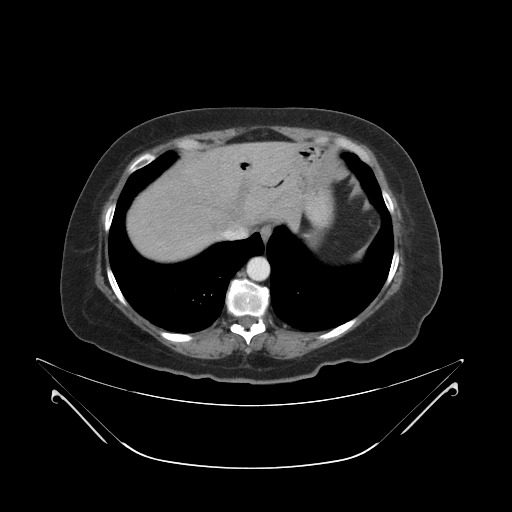

[Series 203: coronals, idose (2) · coronal · 0.45mm/px · 3 of 119 slices shown]
[im 40/119  soft-tissue]
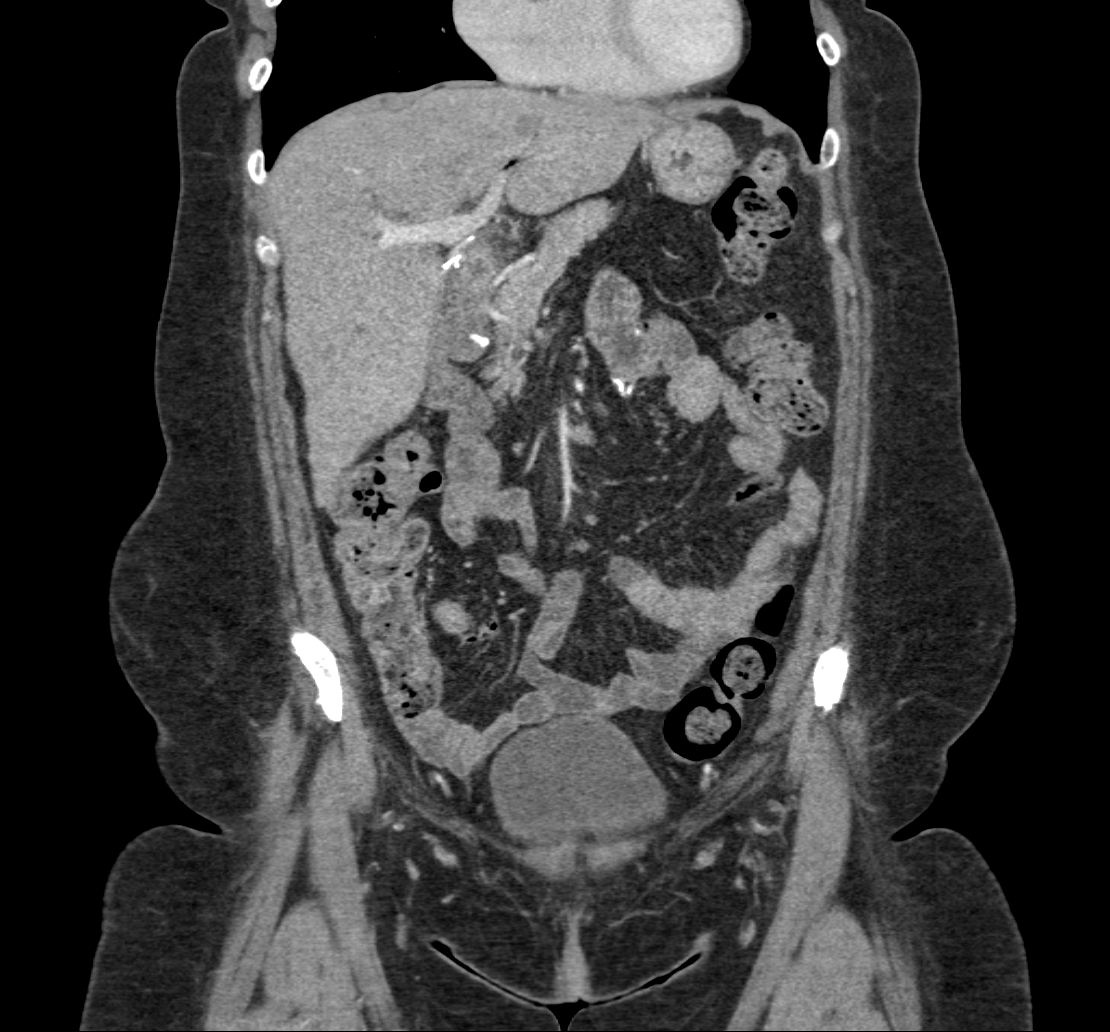
[im 53/119  soft-tissue]
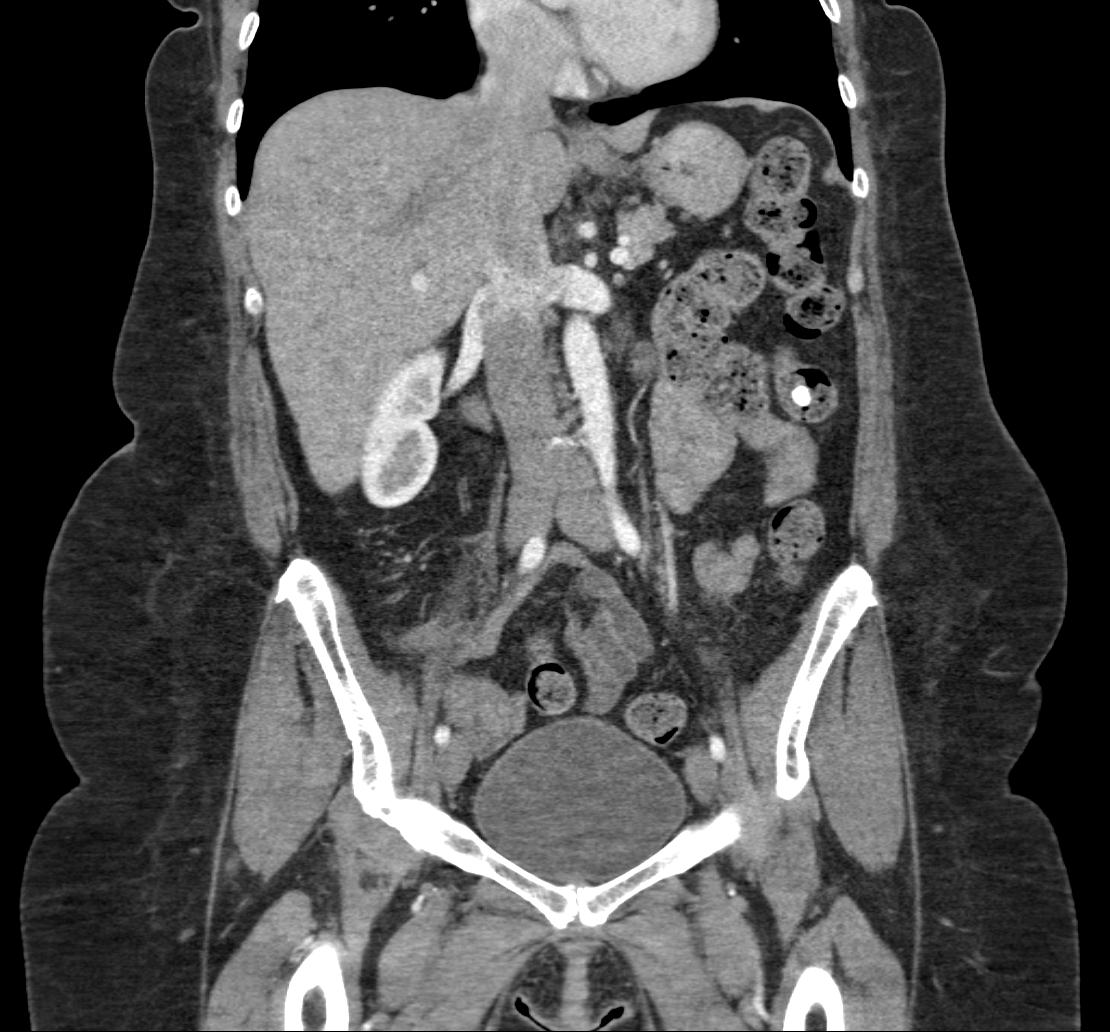
[im 66/119  soft-tissue]
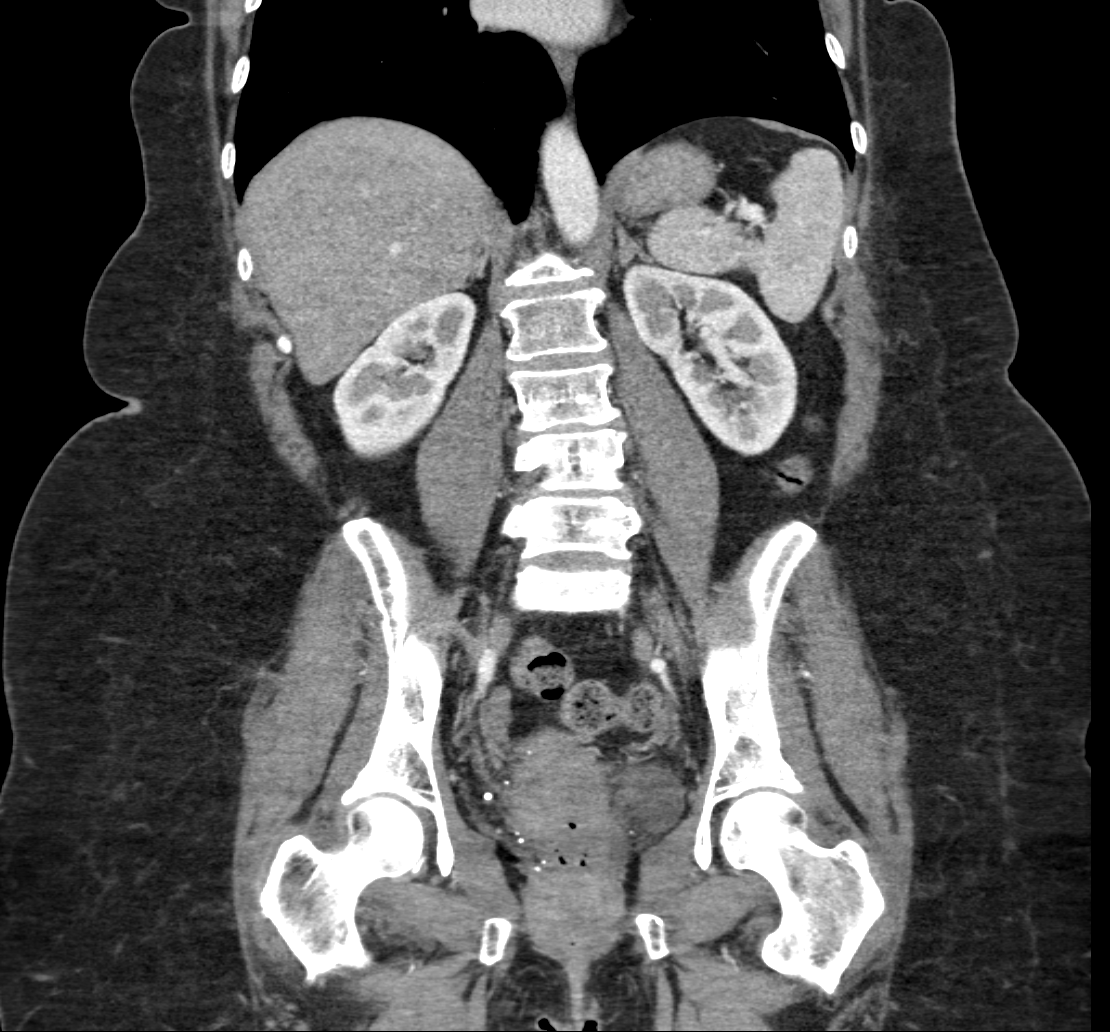

[9 of 46 positions shown; findings below may reference images not displayed]

FINDINGS: Lower chest: Minimal basilar atelectasis/ scarring. Heart size
within normal limits.

Hepatobiliary: Enlarged liver spanning over 19.3 cm. Post
cholecystectomy. Patient has undergone small bowel anastomosis with
biliary duct. Pneumobilia subsequently noted.

Pancreas: No pancreatic mass or inflammation.

Spleen: No mass or enlargement.

Adrenals/Urinary Tract: No renal or ureteral obstructing stone or
evidence hydronephrosis. No renal or adrenal mass.

Noncontrast filled views of the urinary bladder unremarkable.

Stomach/Bowel: Stomach under distended without gross abnormality
noted. Portions of colon under distended. No extraluminal bowel
inflammatory process, free fluid or free air.

Vascular/Lymphatic: No aortic aneurysm or large vessel occlusion.

No adenopathy.

Reproductive: Retroverted uterus.  No worrisome adnexal mass.

Other: No bowel containing hernia.

Musculoskeletal: Mild degenerative changes thoracic. Right hip joint
degenerative changes.
IMPRESSION: Enlarged liver spanning over 19.3 cm.

Post cholecystectomy. Patient has undergone small bowel anastomosis
with biliary duct. Pneumobilia subsequently noted.

No extraluminal bowel inflammatory process.

## 2017-07-22 ENCOUNTER — Ambulatory Visit (INDEPENDENT_AMBULATORY_CARE_PROVIDER_SITE_OTHER): Payer: BLUE CROSS/BLUE SHIELD | Admitting: Psychology

## 2017-07-22 DIAGNOSIS — F332 Major depressive disorder, recurrent severe without psychotic features: Secondary | ICD-10-CM | POA: Diagnosis not present

## 2017-08-05 ENCOUNTER — Ambulatory Visit: Payer: BLUE CROSS/BLUE SHIELD | Admitting: Psychology

## 2017-08-19 ENCOUNTER — Encounter: Payer: Self-pay | Admitting: Adult Health

## 2017-08-19 ENCOUNTER — Ambulatory Visit (INDEPENDENT_AMBULATORY_CARE_PROVIDER_SITE_OTHER): Payer: BLUE CROSS/BLUE SHIELD | Admitting: Psychology

## 2017-08-19 DIAGNOSIS — F332 Major depressive disorder, recurrent severe without psychotic features: Secondary | ICD-10-CM | POA: Diagnosis not present

## 2017-08-25 ENCOUNTER — Ambulatory Visit: Payer: BLUE CROSS/BLUE SHIELD | Admitting: Adult Health

## 2017-09-02 ENCOUNTER — Ambulatory Visit (INDEPENDENT_AMBULATORY_CARE_PROVIDER_SITE_OTHER): Payer: BLUE CROSS/BLUE SHIELD | Admitting: Psychology

## 2017-09-02 DIAGNOSIS — F332 Major depressive disorder, recurrent severe without psychotic features: Secondary | ICD-10-CM | POA: Diagnosis not present

## 2017-09-16 ENCOUNTER — Ambulatory Visit: Payer: BLUE CROSS/BLUE SHIELD | Admitting: Psychology

## 2017-09-16 DIAGNOSIS — F332 Major depressive disorder, recurrent severe without psychotic features: Secondary | ICD-10-CM

## 2017-09-27 ENCOUNTER — Ambulatory Visit (INDEPENDENT_AMBULATORY_CARE_PROVIDER_SITE_OTHER): Payer: BLUE CROSS/BLUE SHIELD | Admitting: Psychiatry

## 2017-09-27 ENCOUNTER — Encounter (HOSPITAL_COMMUNITY): Payer: Self-pay | Admitting: Psychiatry

## 2017-09-27 ENCOUNTER — Encounter

## 2017-09-27 VITALS — BP 103/68 | HR 78 | Ht 65.0 in | Wt 172.2 lb

## 2017-09-27 DIAGNOSIS — Z62898 Other specified problems related to upbringing: Secondary | ICD-10-CM

## 2017-09-27 DIAGNOSIS — Z79899 Other long term (current) drug therapy: Secondary | ICD-10-CM

## 2017-09-27 DIAGNOSIS — F1290 Cannabis use, unspecified, uncomplicated: Secondary | ICD-10-CM | POA: Diagnosis not present

## 2017-09-27 DIAGNOSIS — F411 Generalized anxiety disorder: Secondary | ICD-10-CM

## 2017-09-27 DIAGNOSIS — Z620 Inadequate parental supervision and control: Secondary | ICD-10-CM

## 2017-09-27 DIAGNOSIS — R4581 Low self-esteem: Secondary | ICD-10-CM | POA: Diagnosis not present

## 2017-09-27 DIAGNOSIS — F331 Major depressive disorder, recurrent, moderate: Secondary | ICD-10-CM

## 2017-09-27 MED ORDER — ESCITALOPRAM OXALATE 20 MG PO TABS: 20.0000 mg | ORAL_TABLET | Freq: Every day | ORAL | 0 refills | Status: DC

## 2017-09-27 NOTE — Progress Notes (Signed)
Psychiatric Initial Adult Assessment   Patient Identification: Sharon Cole MRN:  [id number] Date of Evaluation:  09/27/2017 Referral Source: Dr. Cheryln Manly Chief Complaint:  anxiety Visit Diagnosis:    ICD-10-CM   1. GAD (generalized anxiety disorder) F41.1   2. Moderate episode of recurrent major depressive disorder (HCC) F33.1     History of Present Illness:  Sharon Cole is a 62 year old female from [place], who moved to [place] proximally 4 years ago.  She has a self-reported history of generalized anxiety and panic attacks.  She has been on Lexapro 30 mg up until approximately 4-5 weeks ago.  She also endorses a long-standing history of marijuana use, with daily use until 6 weeks ago.  She reports that she is trying to work on improving herself and her mood, and she had hoped that she could be off of Lexapro.  She presents with substantial anxiety and reports that she continues to take Ativan 0.5 mg daily as needed.  She reports that she feels like it is hard for her to talk about her mood, she is quite tearful throughout her interaction.  I spent time with her establishing some rapport and supporting her in reducing her anxiety.  I spent time learning about the patient's childhood upbringing which was brought full of emotional trauma, abandonment issues with her father, and reports that her mother was incredibly controlling and she reports that she never learned to do anything for herself.  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  She reports that this has been somewhat of a relief because she hates her job and has hated it for nearly 20 years.  She is not sure what to do in terms of next steps with employment.  She reports her finances are a mess because she is fairly poor with money management.  Her  children are grown up and she has a fair relationship with them.  She has been divorced 3 times and now lives with her partner, who is nearly 7 years old.  She reports they have a fine relationship, but she is not sexually attracted to him and this becomes an issue regarding their intimacy.  There is no physical or emotional abuse between them, she reports that he gives her space.  I spent time with the patient reflecting with her on her difficulties of coping, poor sense of her self-esteem, ongoing depression symptoms, tearfulness, anxiety, insomnia, poor appetite.  She denies any self-harm, denies any suicidality, and has not had any psychiatric hospitalizations.  She is actively engaged in therapy with Dr. Cheryln Manly, and is hopeful this can provide help.  I spent time with her educating her about PTSD and dialectical behavioral therapy, and referred her to [facility] and strongly suggested she participate in both individual and group interventions at that office.  I also suggested she continue to work with Dr. Cheryln Manly as allowable with her therapy schedule.  We discussed a medication plan to reintroduce Lexapro at a dose of 10 mg for 1 week and then increase to 20 mg.  I educated her the 20 mg is the maximum FDA approved dose, and tends to be fairly effective for most individuals.  Discussed that we can consider augmenting strategies if needed.  For sleep I suggested she try over-the-counter melatonin or use a low-dose of diphenhydramine.  I educated her on the long-term risks of Ativan regarding neurocognitive disorders, addiction, falls.  She is unaware of these risks and was appreciative of the education.  I disclosed to her that I am leaving practice in the next 10-12 weeks and she requested to follow with writer if possible.  I suggested we discuss further at her follow-up, and I be happy to provide her with information on a transition of care plan.  Associated  Signs/Symptoms: Depression Symptoms:  depressed mood, insomnia, fatigue, feelings of worthlessness/guilt, difficulty concentrating, hopelessness, impaired memory, anxiety, panic attacks, disturbed sleep, weight loss, (Hypo) Manic Symptoms:  Irritable Mood, Anxiety Symptoms:  Excessive Worry, Social Anxiety, Psychotic Symptoms:  none PTSD Symptoms: as above  Past Psychiatric History: No psychiatric hospitalizations, she has had psychiatric medication management and outpatient  Previous Psychotropic Medications: Yes   Substance Abuse History in the last 12 months:  Yes.    Consequences of Substance Abuse: Her marijuana use is likely contributed to ongoing mood lability, termination from her job employment, and financial losses  Past Medical History:  Past Medical History:  Diagnosis Date  . Anxiety   . Bradycardia   . Common migraine with intractable migraine 07/03/2016  . Depression   . Dizziness   . Headache   . Menopause   . Syncope     Past Surgical History:  Procedure Laterality Date  . BACK SURGERY     cyst removal   . BREAST SURGERY     breast reduction  . BUNIONECTOMY    . CHOLECYSTECTOMY    . KNEE ARTHROSCOPY      Family Psychiatric History: Denies  Family History:  Family History  Problem Relation Age of Onset  . Heart disease Mother   . Heart disease Brother   . Heart disease Maternal Grandmother   . Heart disease Maternal Grandfather   . Cancer Son        unknown    Social History:   Social History   Socioeconomic History  . Marital status: Significant Other    Spouse name: Not on file  . Number of children: 2  . Years of education: Masters  . Highest education level: Not on file  Occupational History  . Not on file  Social Needs  . Financial resource strain: Not on file  . Food insecurity:    Worry: Not on file    Inability: Not on file  . Transportation needs:    Medical: Not on file    Non-medical: Not on file  Tobacco Use   . Smoking status: Never Smoker  . Smokeless tobacco: Never Used  Substance and Sexual Activity  . Alcohol use: Yes    Alcohol/week: 0.0 oz    Comment: daily  . Drug use: No  . Sexual activity: Yes    Partners: Male  Lifestyle  . Physical activity:    Days per week: Not on file    Minutes per session: Not on file  . Stress: Not on file  Relationships  . Social connections:    Talks on phone: Not on file    Gets together: Not on file    Attends religious service: Not on file    Active member of club or organization: Not on file    Attends meetings of clubs or organizations: Not on file    Relationship status: Not on file  Other Topics Concern  . Not on file  Social History Narrative   Lives   Caffeine use:  Drinks 16oz caffeine drinks a day     Additional Social History: She lives with her partner in [place], she has been divorced 3 times, her youngest child was in his 63s, she has a strained relationship with some of her family members, no contact with her biological father.  She drinks 1 glass of wine a night, and has been abstinent from marijuana use for 6 weeks  Allergies:  No Known Allergies  Metabolic Disorder Labs: No results found for: HGBA1C, MPG No results found for: PROLACTIN Lab Results  Component Value Date   CHOL 168 01/02/2015   TRIG 91.0 01/02/2015   HDL 53.70 01/02/2015   CHOLHDL 3 01/02/2015   VLDL 18.2 01/02/2015   LDLCALC 96 01/02/2015   LDLCALC 98 08/10/2013     Current Medications: Current Outpatient Medications  Medication Sig Dispense Refill  . docusate sodium (COLACE) 100 MG capsule Take 100 mg by mouth daily as needed.     Marland Kitchen escitalopram (LEXAPRO) 20 MG tablet Take 1 tablet (20 mg total) by mouth daily. 90 tablet 0  . LORazepam (ATIVAN) 0.5 MG tablet Take 1 tablet (0.5 mg total) by mouth daily. Needs visit for any refills. 30 tablet 5  . MAGNESIUM PO Take 1 tablet by mouth daily.    . Multiple Vitamins-Minerals (MULTIVITAMIN PO)  Take 1 tablet by mouth daily.    Marland Kitchen omeprazole (PRILOSEC) 40 MG capsule TAKE 1 CAPSULE (40 MG TOTAL) BY MOUTH 2 (TWO) TIMES DAILY BEFORE A MEAL. (Patient taking differently: TAKE 1 CAPSULE (40 MG TOTAL) BY DAILY BEFORE A MEAL.) 60 capsule 3  . topiramate (TOPAMAX) 50 MG tablet Take 3 tablets (150 mg total) by mouth at bedtime. 90 tablet 5  . Turmeric 450 MG CAPS Take 1 capsule by mouth daily.     No current facility-administered medications for this visit.     Neurologic: Headache: Negative Seizure: Negative Paresthesias:Negative  Musculoskeletal: Strength & Muscle Tone: within normal limits Gait & Station: normal Patient leans: N/A  Psychiatric Specialty Exam: Review of Systems  Constitutional: Negative.   HENT: Negative.   Respiratory: Negative.   Cardiovascular: Negative.   Gastrointestinal: Negative.   Musculoskeletal: Negative.   Neurological: Negative.   Psychiatric/Behavioral: Positive for depression and memory loss. Negative for hallucinations, substance abuse and suicidal ideas. The patient is nervous/anxious and has insomnia.     Blood pressure 103/68, pulse 78, height 5\' 5"  (1.651 m), weight 172 lb 3.2 oz (78.1 kg).Body mass index is 28.66 kg/m.  General Appearance: Casual and Fairly Groomed  Eye Contact:  Fair  Speech:  stuttering, anxious  Volume:  Normal  Mood:  Anxious and Depressed  Affect:  Congruent and Tearful  Thought Process:  Goal Directed and Descriptions of Associations: Tangential  Orientation:  Full (Time, Place, and Person)  Thought Content:  Logical and Rumination  Suicidal Thoughts:  No  Homicidal Thoughts:  No  Memory:  Immediate;   Fair  Judgement:  Fair  Insight:  Shallow  Psychomotor Activity:  Restlessness  Concentration:  Concentration: Fair  Recall:  [employer] of Knowledge:Fair  Language: Fair  Akathisia:  Negative  Handed:  Right  AIMS (if indicated):  na  Assets:  Communication Skills Desire for Improvement Financial  Resources/Insurance Housing  ADL's:  Intact  Cognition: WNL  Sleep:  Fair with ativan 0.25 mg nightly    Treatment Plan Summary: Sharon Cole is a 62 year old female with a psychiatric history consistent with complex childhood trauma, issues of attachment and early  childhood and adolescence, personality features of dependence, and difficulty with interpersonal boundaries.  She presents with issues of poor self-esteem, depressed mood, poor coping skills, difficulty in tempering her anxiety, poor concentration and subjective memory complaints.  This is in the context of chronic marijuana use, which she reports that she has been abstinent from for the past 6 weeks.  This is also in the context of her discontinuing Lexapro approximately 4 weeks ago without any taper.  She continues to take Ativan 0.25 mg twice a day as needed.  She does not present with any suicidality or unsafe behaviors.  I spent time with her building some recognition and insight into her areas of need, and I do believe strongly she would benefit from participation in dialectical behavioral therapy.  She is agreeable to this intervention along with medication management recommendations as below.  We will follow-up in 4-6 weeks or sooner if needed.  1. GAD (generalized anxiety disorder)   2. Moderate episode of recurrent major depressive disorder (HCC)     Status of current problems: new  Labs Ordered: No orders of the defined types were placed in this encounter.   Labs Reviewed: NA  Collateral Obtained/Records Reviewed: NA  Plan:  Restart Lexapro 10 mg x 1 week, then increase to 20 mg daily Consider augmentation with low-dose atypical antipsychotic for mood stabilization Recommend taper and discontinue Ativan given the strong deleterious effects Suggested that the patient try over-the-counter melatonin for sleep or use of low-dose diphenhydramine 12.5 mg nightly as needed for sleep Return to clinic in 4  weeks Provided patient with contact information for [facility]  I spent 45 minutes with the patient in direct face-to-face clinical care.  Greater than 50% of this time was spent in counseling and coordination of care with the patient.   Aundra Dubin, MD 5/25/201912:05 PM

## 2017-09-27 NOTE — Patient Instructions (Signed)
[facility] Address: [address], [place], [address] Phone: [phone] Email: [email or web address] Business Hours: M-F 8a-8p, S-S by appointment

## 2017-09-30 ENCOUNTER — Ambulatory Visit (INDEPENDENT_AMBULATORY_CARE_PROVIDER_SITE_OTHER): Payer: BLUE CROSS/BLUE SHIELD | Admitting: Psychology

## 2017-09-30 DIAGNOSIS — F332 Major depressive disorder, recurrent severe without psychotic features: Secondary | ICD-10-CM

## 2017-10-14 ENCOUNTER — Ambulatory Visit (INDEPENDENT_AMBULATORY_CARE_PROVIDER_SITE_OTHER): Payer: BLUE CROSS/BLUE SHIELD | Admitting: Psychology

## 2017-10-14 DIAGNOSIS — F332 Major depressive disorder, recurrent severe without psychotic features: Secondary | ICD-10-CM | POA: Diagnosis not present

## 2017-10-20 ENCOUNTER — Encounter

## 2017-10-20 ENCOUNTER — Ambulatory Visit (HOSPITAL_COMMUNITY): Payer: Self-pay | Admitting: Psychiatry

## 2017-10-28 ENCOUNTER — Ambulatory Visit (INDEPENDENT_AMBULATORY_CARE_PROVIDER_SITE_OTHER): Payer: BLUE CROSS/BLUE SHIELD | Admitting: Psychology

## 2017-10-28 ENCOUNTER — Ambulatory Visit (HOSPITAL_COMMUNITY): Payer: BLUE CROSS/BLUE SHIELD | Admitting: Psychiatry

## 2017-10-28 ENCOUNTER — Encounter (HOSPITAL_COMMUNITY): Payer: Self-pay | Admitting: Psychiatry

## 2017-10-28 VITALS — BP 110/62 | HR 62 | Ht 65.0 in | Wt 170.0 lb

## 2017-10-28 DIAGNOSIS — F331 Major depressive disorder, recurrent, moderate: Secondary | ICD-10-CM

## 2017-10-28 DIAGNOSIS — F411 Generalized anxiety disorder: Secondary | ICD-10-CM | POA: Diagnosis not present

## 2017-10-28 DIAGNOSIS — F332 Major depressive disorder, recurrent severe without psychotic features: Secondary | ICD-10-CM

## 2017-10-28 MED ORDER — BUSPIRONE HCL 15 MG PO TABS: ORAL_TABLET | ORAL | 0 refills | Status: DC

## 2017-10-28 NOTE — Progress Notes (Signed)
[facility] MD/PA/NP OP Progress Note  10/28/2017 10:07 AM Sharon Cole  MRN:  [id number]  Chief Complaint: med check HPI: Sharon Cole presents with much reduced anxiety and reports that she feels a lot better with the Lexapro.  She continues to use Ativan about 3-4 times a week because of anxiety or panic attacks.  We discussed the utility of BuSpar for generalized anxiety disorder and some of the data suggesting equivalent efficacy to benzodiazepines, but it takes 4-6 weeks to build up.  Reviewed the risks and benefits and common side effects with BuSpar.  Agreed to start 7.5 mg twice daily and increase in 1 week to 15 mg twice daily.  She denies any acute safety issues or suicidality.  She reports that things have been much better with her mood and she was able to enjoy a trip to her boyfriend's farm over the weekend.  She reports that she feels much more encouraged and is now looking for a new job.  I spent time with her discussing her thoughts about marijuana use, given that she has remained abstinent.  She reports that she may begin using marijuana in the future, on a more social basis.  I expressed my concerns about this given that she has a pattern of addiction and habituation to marijuana, and I encouraged her to weigh some of the risks and benefits.  She was receptive to this and reports that she is much more cautious about marijuana as a drug than she had been in the past.  Visit Diagnosis:    ICD-10-CM   1. GAD (generalized anxiety disorder) F41.1 busPIRone (BUSPAR) 15 MG tablet  2. Moderate episode of recurrent major depressive disorder (HCC) F33.1 busPIRone (BUSPAR) 15 MG tablet    Past Psychiatric History: See intake H&P for full details. Reviewed, with no updates at this time.   Past Medical History:  Past Medical History:  Diagnosis Date  . Anxiety   . Bradycardia   . Common migraine with intractable migraine 07/03/2016  . Depression   . Dizziness   . Headache   . Menopause    . Syncope     Past Surgical History:  Procedure Laterality Date  . BACK SURGERY     cyst removal   . BREAST SURGERY     breast reduction  . BUNIONECTOMY    . CHOLECYSTECTOMY    . KNEE ARTHROSCOPY      Family Psychiatric History: See intake H&P for full details. Reviewed, with no updates at this time.   Family History:  Family History  Problem Relation Age of Onset  . Heart disease Mother   . Heart disease Brother   . Heart disease Maternal Grandmother   . Heart disease Maternal Grandfather   . Cancer Son        unknown    Social History:  Social History   Socioeconomic History  . Marital status: Significant Other    Spouse name: Not on file  . Number of children: 2  . Years of education: Masters  . Highest education level: Not on file  Occupational History  . Not on file  Social Needs  . Financial resource strain: Not on file  . Food insecurity:    Worry: Not on file    Inability: Not on file  . Transportation needs:    Medical: Not on file    Non-medical: Not on file  Tobacco Use  . Smoking status: Never Smoker  . Smokeless tobacco: Never Used  Substance and Sexual Activity  . Alcohol use: Yes    Alcohol/week: 0.0 - 1.2 oz    Comment: daily  . Drug use: No  . Sexual activity: Yes    Partners: Male  Lifestyle  . Physical activity:    Days per week: Not on file    Minutes per session: Not on file  . Stress: Not on file  Relationships  . Social connections:    Talks on phone: Not on file    Gets together: Not on file    Attends religious service: Not on file    Active member of club or organization: Not on file    Attends meetings of clubs or organizations: Not on file    Relationship status: Not on file  Other Topics Concern  . Not on file  Social History Narrative   Lives   Caffeine use:    Drinks 16oz caffeine drinks a day     Allergies: No Known Allergies  Metabolic Disorder Labs: No results found for: HGBA1C, MPG No results found  for: PROLACTIN Lab Results  Component Value Date   CHOL 168 01/02/2015   TRIG 91.0 01/02/2015   HDL 53.70 01/02/2015   CHOLHDL 3 01/02/2015   VLDL 18.2 01/02/2015   LDLCALC 96 01/02/2015   LDLCALC 98 08/10/2013   Lab Results  Component Value Date   TSH 1.04 01/02/2015   TSH 2.986 08/10/2013    Therapeutic Level Labs: No results found for: LITHIUM No results found for: VALPROATE No components found for:  CBMZ  Current Medications: Current Outpatient Medications  Medication Sig Dispense Refill  . docusate sodium (COLACE) 100 MG capsule Take 100 mg by mouth daily as needed.     Marland Kitchen escitalopram (LEXAPRO) 20 MG tablet Take 1 tablet (20 mg total) by mouth daily. 90 tablet 0  . LORazepam (ATIVAN) 0.5 MG tablet Take 1 tablet (0.5 mg total) by mouth daily. Needs visit for any refills. 30 tablet 5  . MAGNESIUM PO Take 1 tablet by mouth daily.    . Multiple Vitamins-Minerals (MULTIVITAMIN PO) Take 1 tablet by mouth daily.    Marland Kitchen omeprazole (PRILOSEC) 40 MG capsule TAKE 1 CAPSULE (40 MG TOTAL) BY MOUTH 2 (TWO) TIMES DAILY BEFORE A MEAL. (Patient taking differently: TAKE 1 CAPSULE (40 MG TOTAL) BY DAILY BEFORE A MEAL.) 60 capsule 3  . topiramate (TOPAMAX) 50 MG tablet Take 3 tablets (150 mg total) by mouth at bedtime. 90 tablet 5  . Turmeric 450 MG CAPS Take 1 capsule by mouth daily.    . busPIRone (BUSPAR) 15 MG tablet Take 0.5 tablets (7.5 mg total) by mouth 2 (two) times daily for 7 days, THEN 1 tablet (15 mg total) 2 (two) times daily. 173 tablet 0   No current facility-administered medications for this visit.      Musculoskeletal: Strength & Muscle Tone: within normal limits Gait & Station: normal Patient leans: N/A  Psychiatric Specialty Exam: ROS  Blood pressure 110/62, pulse 62, height 5\' 5"  (1.651 m), weight 170 lb (77.1 kg), SpO2 97 %.Body mass index is 28.29 kg/m.  General Appearance: Casual and Well Groomed  Eye Contact:  Good  Speech:  Clear and Coherent and Normal Rate   Volume:  Normal  Mood:  Euthymic  Affect:  Appropriate and Congruent  Thought Process:  Goal Directed and Descriptions of Associations: Intact  Orientation:  Full (Time, Place, and Person)  Thought Content: Logical   Suicidal Thoughts:  No  Homicidal Thoughts:  No  Memory:  Immediate;   Good  Judgement:  Good  Insight:  Good  Psychomotor Activity:  Normal  Concentration:  Concentration: Good  Recall:  Good  Fund of Knowledge: Good  Language: Good  Akathisia:  Negative  Handed:  Right  AIMS (if indicated): not done  Assets:  Communication Skills Desire for Improvement Financial Resources/Insurance Housing Intimacy Transportation  ADL's:  Intact  Cognition: WNL  Sleep:  Good   Assessment and Plan:  Sharon Cole presents with a substantial reduction of anxiety and improvement of depressive symptoms with Lexapro 20 mg.  She does continue to have breakthrough generalized worry and panic-like symptoms and we agreed to initiate BuSpar as below, with the goal of discontinuing her use of Ativan given the risks associated with benzodiazepine above the age of 44.  No acute safety issues and we will follow-up in 4-6 weeks.  1. GAD (generalized anxiety disorder)   2. Moderate episode of recurrent major depressive disorder (HCC)     Status of current problems: gradually improving  Labs Ordered: No orders of the defined types were placed in this encounter.   Labs Reviewed: na  Collateral Obtained/Records Reviewed: na  Plan:  Continue Lexapro 20 mg Okay to use Ativan for acute panic or anxiety Start BuSpar 7.5 mg twice daily, increase to 15 mg twice daily in 1 week   Aundra Dubin, MD 10/28/2017, 10:07 AM

## 2017-11-11 ENCOUNTER — Ambulatory Visit: Payer: BLUE CROSS/BLUE SHIELD | Admitting: Psychology

## 2017-11-15 ENCOUNTER — Other Ambulatory Visit: Payer: Self-pay | Admitting: Neurology

## 2017-11-17 ENCOUNTER — Other Ambulatory Visit: Payer: Self-pay | Admitting: Neurology

## 2017-11-17 DIAGNOSIS — Z760 Encounter for issue of repeat prescription: Secondary | ICD-10-CM

## 2017-11-17 NOTE — Telephone Encounter (Signed)
She has not  f/u  With Dr Jannifer Franklin in 4 months as requested  hence I would not refill her lorazepam unless she is seen in the office by him or nurse practitioner.

## 2017-11-17 NOTE — Telephone Encounter (Signed)
Dr. Jannifer Franklin patient. Rx registry checked. Last fill date is 10/06/17 for #30. Last OV was 04/24/17, no follow up scheduled.

## 2017-11-25 ENCOUNTER — Ambulatory Visit: Payer: BLUE CROSS/BLUE SHIELD | Admitting: Psychology

## 2017-12-03 ENCOUNTER — Encounter (HOSPITAL_COMMUNITY): Payer: Self-pay | Admitting: Psychiatry

## 2017-12-03 ENCOUNTER — Ambulatory Visit (HOSPITAL_COMMUNITY): Payer: BLUE CROSS/BLUE SHIELD | Admitting: Psychiatry

## 2017-12-03 DIAGNOSIS — F331 Major depressive disorder, recurrent, moderate: Secondary | ICD-10-CM

## 2017-12-03 DIAGNOSIS — F411 Generalized anxiety disorder: Secondary | ICD-10-CM

## 2017-12-03 MED ORDER — BUSPIRONE HCL 30 MG PO TABS: 30.0000 mg | ORAL_TABLET | Freq: Two times a day (BID) | ORAL | 0 refills | Status: DC

## 2017-12-03 MED ORDER — ESCITALOPRAM OXALATE 20 MG PO TABS: 20.0000 mg | ORAL_TABLET | Freq: Every day | ORAL | 0 refills | Status: DC

## 2017-12-03 NOTE — Progress Notes (Signed)
[facility] MD/PA/NP OP Progress Note  12/03/2017 8:52 AM Sharon Cole  MRN:  [id number]  Chief Complaint: med check  HPI: Sharon Cole reports significant improvement of her anxiety.  Approximately 60-70% better.  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  She has made peace with being able to continue in that location.  No acute safety issues.  Things are going okay in terms of her personal and romantic life.  No nausea or upset stomach from the BuSpar and we agreed to increase further to 30 mg twice a day for further benefit.  Not using Ativan anymore, sleeping well at night.  Visit Diagnosis:    ICD-10-CM   1. GAD (generalized anxiety disorder) F41.1 busPIRone (BUSPAR) 30 MG tablet    escitalopram (LEXAPRO) 20 MG tablet  2. Moderate episode of recurrent major depressive disorder (HCC) F33.1 busPIRone (BUSPAR) 30 MG tablet    escitalopram (LEXAPRO) 20 MG tablet    Past Psychiatric History: See intake H&P for full details. Reviewed, with no updates at this time.  Past Medical History:  Past Medical History:  Diagnosis Date  . Anxiety   . Bradycardia   . Common migraine with intractable migraine 07/03/2016  . Depression   . Dizziness   . Headache   . Menopause   . Syncope     Past Surgical History:  Procedure Laterality Date  . BACK SURGERY     cyst removal   . BREAST SURGERY     breast reduction  . BUNIONECTOMY    . CHOLECYSTECTOMY    . KNEE ARTHROSCOPY      Family Psychiatric History: See intake H&P for full details. Reviewed, with no updates at this time.   Family History:  Family History  Problem Relation Age of Onset  . Heart disease Mother   . Heart disease Brother   . Heart disease Maternal Grandmother   . Heart disease Maternal Grandfather   . Cancer Son        unknown    Social History:  Social History   Socioeconomic History  . Marital status: Significant Other    Spouse name: Not on file  . Number of children: 2  . Years of  education: Masters  . Highest education level: Not on file  Occupational History  . Not on file  Social Needs  . Financial resource strain: Not on file  . Food insecurity:    Worry: Not on file    Inability: Not on file  . Transportation needs:    Medical: Not on file    Non-medical: Not on file  Tobacco Use  . Smoking status: Never Smoker  . Smokeless tobacco: Never Used  Substance and Sexual Activity  . Alcohol use: Yes    Alcohol/week: 0.0 - 1.2 oz    Comment: daily  . Drug use: No  . Sexual activity: Yes    Partners: Male  Lifestyle  . Physical activity:    Days per week: Not on file    Minutes per session: Not on file  . Stress: Not on file  Relationships  . Social connections:    Talks on phone: Not on file    Gets together: Not on file    Attends religious service: Not on file    Active member of club or organization: Not on file    Attends meetings of clubs or organizations: Not on file    Relationship status: Not on file  Other Topics Concern  .  Not on file  Social History Narrative   Lives   Caffeine use:    Drinks 16oz caffeine drinks a day     Allergies: No Active Allergies  Metabolic Disorder Labs: No results found for: HGBA1C, MPG No results found for: PROLACTIN Lab Results  Component Value Date   CHOL 168 01/02/2015   TRIG 91.0 01/02/2015   HDL 53.70 01/02/2015   CHOLHDL 3 01/02/2015   VLDL 18.2 01/02/2015   LDLCALC 96 01/02/2015   LDLCALC 98 08/10/2013   Lab Results  Component Value Date   TSH 1.04 01/02/2015   TSH 2.986 08/10/2013    Therapeutic Level Labs: No results found for: LITHIUM No results found for: VALPROATE No components found for:  CBMZ  Current Medications: Current Outpatient Medications  Medication Sig Dispense Refill  . busPIRone (BUSPAR) 30 MG tablet Take 1 tablet (30 mg total) by mouth 2 (two) times daily. 180 tablet 0  . docusate sodium (COLACE) 100 MG capsule Take 100 mg by mouth daily as needed.     Marland Kitchen  escitalopram (LEXAPRO) 20 MG tablet Take 1 tablet (20 mg total) by mouth daily. 90 tablet 0  . MAGNESIUM PO Take 1 tablet by mouth daily.    . Multiple Vitamins-Minerals (MULTIVITAMIN PO) Take 1 tablet by mouth daily.    Marland Kitchen omeprazole (PRILOSEC) 40 MG capsule TAKE 1 CAPSULE (40 MG TOTAL) BY MOUTH 2 (TWO) TIMES DAILY BEFORE A MEAL. (Patient taking differently: TAKE 1 CAPSULE (40 MG TOTAL) BY DAILY BEFORE A MEAL.) 60 capsule 3  . topiramate (TOPAMAX) 50 MG tablet TAKE 3 TABLETS (150 MG TOTAL) BY MOUTH AT BEDTIME. 90 tablet 0  . Turmeric 450 MG CAPS Take 1 capsule by mouth daily.     No current facility-administered medications for this visit.      Musculoskeletal: Strength & Muscle Tone: within normal limits Gait & Station: normal Patient leans: N/A  Psychiatric Specialty Exam: ROS  Blood pressure 105/68, pulse (!) 53, height 5\' 5"  (1.651 m), weight 170 lb (77.1 kg), SpO2 98 %.Body mass index is 28.29 kg/m.  General Appearance: Casual and Well Groomed  Eye Contact:  Good  Speech:  Clear and Coherent and Normal Rate  Volume:  Normal  Mood:  Euthymic  Affect:  Appropriate and Congruent  Thought Process:  Goal Directed and Descriptions of Associations: Intact  Orientation:  Full (Time, Place, and Person)  Thought Content: Logical   Suicidal Thoughts:  No  Homicidal Thoughts:  No  Memory:  Immediate;   Good  Judgement:  Good  Insight:  Good  Psychomotor Activity:  Normal  Concentration:  Concentration: Good  Recall:  Good  Fund of Knowledge: Good  Language: Good  Akathisia:  Negative  Handed:  Right  AIMS (if indicated): not done  Assets:  Communication Skills Desire for Improvement Financial Resources/Insurance Housing Intimacy Transportation  ADL's:  Intact  Cognition: WNL  Sleep:  Good   Assessment and Plan:  Sharon Cole presents with even more reduction of anxiety.  Still some breakthrough anxiety related to job search, and ongoing GAD symptoms.  We agreed to  increase BuSpar to 30 mg twice a day.  She is no longer requiring the use of Ativan.  1. GAD (generalized anxiety disorder)   2. Moderate episode of recurrent major depressive disorder (HCC)     Status of current problems: gradually improving  Labs Ordered: No orders of the defined types were placed in this encounter.   Labs Reviewed: na  Collateral Obtained/Records Reviewed: na  Plan:  Continue Lexapro 20 mg Buspar 30 mg BID rtc 2-3 months  Aundra Dubin, MD 12/03/2017, 8:52 AM

## 2017-12-09 ENCOUNTER — Ambulatory Visit: Payer: BLUE CROSS/BLUE SHIELD | Admitting: Psychology

## 2017-12-11 ENCOUNTER — Ambulatory Visit (INDEPENDENT_AMBULATORY_CARE_PROVIDER_SITE_OTHER): Payer: BLUE CROSS/BLUE SHIELD | Admitting: Psychology

## 2017-12-11 DIAGNOSIS — F332 Major depressive disorder, recurrent severe without psychotic features: Secondary | ICD-10-CM

## 2017-12-16 ENCOUNTER — Other Ambulatory Visit: Payer: Self-pay | Admitting: Neurology

## 2017-12-16 ENCOUNTER — Telehealth: Payer: Self-pay | Admitting: *Deleted

## 2017-12-16 NOTE — Telephone Encounter (Signed)
Received refill request for topiramate from the pharmacy.  The patient was last seen in 04/2017 and asked to follow up in four months.  She has no pending appt.  Last month, she was provided with a refill with a note asking her to call our office to schedule a follow up.  I have left her a detailed message today (ok per DPR) letting her know that one more month of medication can be provided but she will need to call our office to schedule with Dr. Jannifer Franklin.  If she does not wish to follow up here, she may request refills from her PCP.  Provided our number and office hours.   Please schedule follow up with Dr. Jannifer Franklin when patient calls back.

## 2017-12-23 ENCOUNTER — Ambulatory Visit: Payer: Self-pay | Admitting: Psychology

## 2017-12-23 DIAGNOSIS — F332 Major depressive disorder, recurrent severe without psychotic features: Secondary | ICD-10-CM

## 2018-01-06 ENCOUNTER — Ambulatory Visit: Payer: BLUE CROSS/BLUE SHIELD | Admitting: Psychology

## 2018-01-19 ENCOUNTER — Telehealth: Payer: Self-pay | Admitting: Neurology

## 2018-01-20 ENCOUNTER — Ambulatory Visit: Payer: Self-pay | Admitting: Psychology

## 2018-01-20 MED ORDER — TOPIRAMATE 50 MG PO TABS: 150.0000 mg | ORAL_TABLET | Freq: Every day | ORAL | 0 refills | Status: DC

## 2018-01-20 NOTE — Telephone Encounter (Signed)
Patient requesting refill for topiramate (TOPAMAX) 50 MG tablet sent to [facility] on [address]. I advised she would need to keep the appointment on 03-02-18. She also spoke to [facility] and paid bill.

## 2018-01-20 NOTE — Addendum Note (Signed)
Addended by: Edison Pace, Margarit Minshall L on: 01/20/2018 12:12 PM   Modules accepted: Orders

## 2018-01-20 NOTE — Telephone Encounter (Signed)
E-scribed refill to pharmacy. 

## 2018-02-03 ENCOUNTER — Ambulatory Visit: Payer: Self-pay | Admitting: Psychology

## 2018-02-06 ENCOUNTER — Ambulatory Visit: Payer: Self-pay | Admitting: Psychology

## 2018-02-06 DIAGNOSIS — F332 Major depressive disorder, recurrent severe without psychotic features: Secondary | ICD-10-CM

## 2018-02-17 ENCOUNTER — Ambulatory Visit (INDEPENDENT_AMBULATORY_CARE_PROVIDER_SITE_OTHER): Payer: Self-pay | Admitting: Psychology

## 2018-02-17 DIAGNOSIS — F332 Major depressive disorder, recurrent severe without psychotic features: Secondary | ICD-10-CM

## 2018-02-27 ENCOUNTER — Other Ambulatory Visit: Payer: Self-pay | Admitting: Neurology

## 2018-03-02 ENCOUNTER — Ambulatory Visit: Payer: Self-pay | Admitting: Neurology

## 2018-03-02 ENCOUNTER — Other Ambulatory Visit: Payer: Self-pay

## 2018-03-02 ENCOUNTER — Encounter: Payer: Self-pay | Admitting: Neurology

## 2018-03-02 DIAGNOSIS — G43019 Migraine without aura, intractable, without status migrainosus: Secondary | ICD-10-CM

## 2018-03-02 MED ORDER — TOPIRAMATE 50 MG PO TABS: 150.0000 mg | ORAL_TABLET | Freq: Every day | ORAL | 3 refills | Status: DC

## 2018-03-02 MED ORDER — RIZATRIPTAN BENZOATE 10 MG PO TABS: 10.0000 mg | ORAL_TABLET | Freq: Three times a day (TID) | ORAL | 3 refills | Status: DC | PRN

## 2018-03-02 NOTE — Progress Notes (Signed)
Reason for visit: Migraine headache  Sharon Cole is an 62 y.o. female  History of present illness:  Sharon Cole is a 62 year old right-handed white female with a history of intractable migraine headache.  The patient has done much better on Topamax 150 mg at night, she tolerates this well, her headaches have reduced to having 2 or 3 a month, she does not take anything for the headache when it comes on.  She claims that over-the-counter medications such as Aleve, Advil, Excedrin Migraine or sinus medications do not seem to help the headache.  The patient indicates that the headaches may last all day long, usually stress will bring on a headache.  She does not note any other activating factors for the headache.  Past Medical History:  Diagnosis Date  . Anxiety   . Bradycardia   . Common migraine with intractable migraine 07/03/2016  . Depression   . Dizziness   . Headache   . Menopause   . Syncope     Past Surgical History:  Procedure Laterality Date  . BACK SURGERY     cyst removal   . BREAST SURGERY     breast reduction  . BUNIONECTOMY    . CHOLECYSTECTOMY    . KNEE ARTHROSCOPY      Family History  Problem Relation Age of Onset  . Heart disease Mother   . Heart disease Brother   . Heart disease Maternal Grandmother   . Heart disease Maternal Grandfather   . Cancer Son        unknown    Social history:  reports that she has never smoked. She has never used smokeless tobacco. She reports that she drinks alcohol. She reports that she does not use drugs.   No Active Allergies  Medications:  Prior to Admission medications   Medication Sig Start Date End Date Taking? Authorizing Provider  busPIRone (BUSPAR) 30 MG tablet Take 1 tablet (30 mg total) by mouth 2 (two) times daily. 12/03/17 12/03/18 Yes Eksir, Richard Miu, MD  calcium-vitamin D (OSCAL WITH D) 250-125 MG-UNIT tablet Take 1 tablet by mouth daily.   Yes [provider]  escitalopram  (LEXAPRO) 20 MG tablet Take 1 tablet (20 mg total) by mouth daily. 12/03/17  Yes Eksir, Richard Miu, MD  MAGNESIUM PO Take 1 tablet by mouth daily.   Yes [provider]  Multiple Vitamins-Minerals (MULTIVITAMIN PO) Take 1 tablet by mouth daily.   Yes [provider]  omeprazole (PRILOSEC) 10 MG capsule Take 10 mg by mouth daily.   Yes [provider]  topiramate (TOPAMAX) 50 MG tablet Take 3 tablets (150 mg total) by mouth at bedtime. Please call [phone] to schedule follow up to continue refills. 03/02/18  Yes Kathrynn Ducking, MD  rizatriptan (MAXALT) 10 MG tablet Take 1 tablet (10 mg total) by mouth 3 (three) times daily as needed for migraine. 03/02/18   Kathrynn Ducking, MD    ROS:  Out of a complete 14 system review of symptoms, the patient complains only of the following symptoms, and all other reviewed systems are negative.  Memory loss Cold intolerance  There were no vitals taken for this visit.  Physical Exam  General: The patient is alert and cooperative at the time of the examination.  The patient is mildly obese.  Skin: No significant peripheral edema is noted.   Neurologic Exam  Mental status: The patient is alert and oriented x 3 at the time of the examination.  The patient has apparent normal recent and remote memory, with an apparently normal attention span and concentration ability.   Cranial nerves: Facial symmetry is present. Speech is normal, no aphasia or dysarthria is noted. Extraocular movements are full. Visual fields are full.  Motor: The patient has good strength in all 4 extremities.  Sensory examination: Soft touch sensation is symmetric on the face, arms, and legs.  Coordination: The patient has good finger-nose-finger and heel-to-shin bilaterally.  Gait and station: The patient has a normal gait. Tandem gait is normal. Romberg is negative. No drift is seen.  Reflexes: Deep tendon reflexes are  symmetric.   Assessment/Plan:  1.  Migraine headache  The patient will be given a prescription for Maxalt to take if needed for the headache.  She will continue on the Topamax, a prescription was sent in.  She will follow-up through this office in 6 months, sooner if needed.  Jill Alexanders MD 03/02/2018 12:41 PM  [facility] [address] [place], [place] [address]  Phone [phone] Fax [phone]

## 2018-03-03 ENCOUNTER — Ambulatory Visit (INDEPENDENT_AMBULATORY_CARE_PROVIDER_SITE_OTHER): Payer: Self-pay | Admitting: Psychology

## 2018-03-03 DIAGNOSIS — F332 Major depressive disorder, recurrent severe without psychotic features: Secondary | ICD-10-CM

## 2018-03-17 ENCOUNTER — Ambulatory Visit: Payer: Self-pay | Admitting: Psychology

## 2018-03-31 ENCOUNTER — Ambulatory Visit: Payer: Self-pay | Admitting: Psychology

## 2018-04-10 ENCOUNTER — Other Ambulatory Visit (HOSPITAL_COMMUNITY): Payer: Self-pay

## 2018-04-10 DIAGNOSIS — F411 Generalized anxiety disorder: Secondary | ICD-10-CM

## 2018-04-10 DIAGNOSIS — F331 Major depressive disorder, recurrent, moderate: Secondary | ICD-10-CM

## 2018-04-10 MED ORDER — BUSPIRONE HCL 30 MG PO TABS: 30.0000 mg | ORAL_TABLET | Freq: Two times a day (BID) | ORAL | 0 refills | Status: DC

## 2018-04-10 MED ORDER — ESCITALOPRAM OXALATE 20 MG PO TABS: 20.0000 mg | ORAL_TABLET | Freq: Every day | ORAL | 0 refills | Status: DC

## 2018-04-14 ENCOUNTER — Ambulatory Visit (INDEPENDENT_AMBULATORY_CARE_PROVIDER_SITE_OTHER): Payer: Self-pay | Admitting: Psychology

## 2018-04-14 DIAGNOSIS — F332 Major depressive disorder, recurrent severe without psychotic features: Secondary | ICD-10-CM

## 2018-05-12 ENCOUNTER — Ambulatory Visit (INDEPENDENT_AMBULATORY_CARE_PROVIDER_SITE_OTHER): Payer: Self-pay | Admitting: Psychology

## 2018-05-12 DIAGNOSIS — F332 Major depressive disorder, recurrent severe without psychotic features: Secondary | ICD-10-CM

## 2018-05-25 ENCOUNTER — Ambulatory Visit (INDEPENDENT_AMBULATORY_CARE_PROVIDER_SITE_OTHER): Payer: Self-pay | Admitting: Psychiatry

## 2018-05-25 ENCOUNTER — Encounter (HOSPITAL_COMMUNITY): Payer: Self-pay | Admitting: Psychiatry

## 2018-05-25 DIAGNOSIS — F411 Generalized anxiety disorder: Secondary | ICD-10-CM

## 2018-05-25 DIAGNOSIS — F331 Major depressive disorder, recurrent, moderate: Secondary | ICD-10-CM

## 2018-05-25 MED ORDER — BUSPIRONE HCL 30 MG PO TABS: 30.0000 mg | ORAL_TABLET | Freq: Two times a day (BID) | ORAL | 0 refills | Status: DC

## 2018-05-25 MED ORDER — ESCITALOPRAM OXALATE 20 MG PO TABS: 20.0000 mg | ORAL_TABLET | Freq: Every day | ORAL | 0 refills | Status: DC

## 2018-05-25 NOTE — Progress Notes (Signed)
BH MD/PA/NP OP Progress Note  05/25/2018 2:19 PM Sharon Cole  MRN:  [id number]  Chief Complaint: Patient returns for scheduled medication management visit HPI: 63 year old female, lives with fianc, currently unemployed.  She had been following with Dr. Daron Offer for outpatient psychiatric management.  She reports long history of anxiety disorder, and describes history of excessive worrying, ruminations, some panic attacks.  She also endorses history of depression, but identifies anxiety is major/main symptom.  Denies alcohol or substance abuse . Medical history remarkable for migraines and history of common bile duct stones, which required surgery . She states she is currently stable and doing well in her daily activities.  Feels anxiety has been well controlled and managed on her current medication regimen (Lexapro, BuSpar.  She is also on Topamax prescribed by her neurologist for migraine prophylaxis) Denies medication side effects.  Currently does not endorse any significant/major neurovegetative symptoms of depression and denies anhedonia or pervasive sense of sadness.  Also denies any suicidal ideations.  Today presents euthymic with a full range of affect. She reports a past history of decreased self-esteem often associated with her body image/weight in the past, but states that she has lost weight and is currently comfortable with her present weight and no longer as concerned.  Of note does not endorse any clear history of eating disorder at this time.   Visit Diagnosis:    ICD-10-CM   1. GAD (generalized anxiety disorder) F41.1 busPIRone (BUSPAR) 30 MG tablet    escitalopram (LEXAPRO) 20 MG tablet  2. Moderate episode of recurrent major depressive disorder (HCC) F33.1 busPIRone (BUSPAR) 30 MG tablet    escitalopram (LEXAPRO) 20 MG tablet    Past Psychiatric History:  Past Medical History:  Past Medical History:  Diagnosis Date  . Anxiety   . Bradycardia   . Common migraine  with intractable migraine 07/03/2016  . Depression   . Dizziness   . Headache   . Menopause   . Syncope     Past Surgical History:  Procedure Laterality Date  . BACK SURGERY     cyst removal   . BREAST SURGERY     breast reduction  . BUNIONECTOMY    . CHOLECYSTECTOMY    . KNEE ARTHROSCOPY      Family Psychiatric History:   Family History:  Family History  Problem Relation Age of Onset  . Heart disease Mother   . Heart disease Brother   . Heart disease Maternal Grandmother   . Heart disease Maternal Grandfather   . Cancer Son        unknown    Social History:  Social History   Socioeconomic History  . Marital status: Significant Other    Spouse name: Not on file  . Number of children: 2  . Years of education: Masters  . Highest education level: Not on file  Occupational History  . Not on file  Social Needs  . Financial resource strain: Not on file  . Food insecurity:    Worry: Not on file    Inability: Not on file  . Transportation needs:    Medical: Not on file    Non-medical: Not on file  Tobacco Use  . Smoking status: Never Smoker  . Smokeless tobacco: Never Used  Substance and Sexual Activity  . Alcohol use: Yes    Alcohol/week: 1.0 standard drinks    Types: 1 Glasses of wine per week    Comment: daily  . Drug use: No  . Sexual  activity: Yes    Partners: Male  Lifestyle  . Physical activity:    Days per week: Not on file    Minutes per session: Not on file  . Stress: Not on file  Relationships  . Social connections:    Talks on phone: Not on file    Gets together: Not on file    Attends religious service: Not on file    Active member of club or organization: Not on file    Attends meetings of clubs or organizations: Not on file    Relationship status: Not on file  Other Topics Concern  . Not on file  Social History Narrative   Lives   Caffeine use:    Drinks 16oz caffeine drinks a day     Allergies: No Known Allergies  Metabolic  Disorder Labs: No results found for: HGBA1C, MPG No results found for: PROLACTIN Lab Results  Component Value Date   CHOL 168 01/02/2015   TRIG 91.0 01/02/2015   HDL 53.70 01/02/2015   CHOLHDL 3 01/02/2015   VLDL 18.2 01/02/2015   LDLCALC 96 01/02/2015   LDLCALC 98 08/10/2013   Lab Results  Component Value Date   TSH 1.04 01/02/2015   TSH 2.986 08/10/2013    Therapeutic Level Labs: No results found for: LITHIUM No results found for: VALPROATE No components found for:  CBMZ  Current Medications: Current Outpatient Medications  Medication Sig Dispense Refill  . busPIRone (BUSPAR) 30 MG tablet Take 1 tablet (30 mg total) by mouth 2 (two) times daily. 180 tablet 0  . calcium-vitamin D (OSCAL WITH D) 250-125 MG-UNIT tablet Take 1 tablet by mouth daily.    Marland Kitchen escitalopram (LEXAPRO) 20 MG tablet Take 1 tablet (20 mg total) by mouth daily. 90 tablet 0  . MAGNESIUM PO Take 1 tablet by mouth daily.    . Multiple Vitamins-Minerals (MULTIVITAMIN PO) Take 1 tablet by mouth daily.    Marland Kitchen omeprazole (PRILOSEC) 10 MG capsule Take 10 mg by mouth daily.    . rizatriptan (MAXALT) 10 MG tablet Take 1 tablet (10 mg total) by mouth 3 (three) times daily as needed for migraine. 10 tablet 3  . topiramate (TOPAMAX) 50 MG tablet Take 3 tablets (150 mg total) by mouth at bedtime. Please call [phone] to schedule follow up to continue refills. 270 tablet 3   No current facility-administered medications for this visit.      Musculoskeletal: Strength & Muscle Tone: within normal limits Gait & Station: normal Patient leans: N/A  Psychiatric Specialty Exam: ROS no chest pain, no shortness of breath, no vomiting  Blood pressure 126/74, height 5\' 5"  (1.651 m), weight 72.1 kg.Body mass index is 26.46 kg/m.  General Appearance: Well Groomed  Eye Contact:  Good  Speech:  Normal Rate  Volume:  Normal  Mood:  Currently euthymic, denies feeling depressed at this time  Affect:  Appropriate and Full Range   Thought Process:  Linear and Descriptions of Associations: Intact  Orientation:  Full (Time, Place, and Person)  Thought Content: No hallucinations, no delusions   Suicidal Thoughts:  No denies suicidal or self-injurious ideations, denies homicidal or violent ideations  Homicidal Thoughts:  No  Memory:  Recent and remote grossly intact  Judgement:  Other:  Present  Insight:  Present  Psychomotor Activity:  Normal-no psychomotor agitation or restlessness  Concentration:  Concentration: Good and Attention Span: Good  Recall:  Good  Fund of Knowledge: Good  Language: Good  Akathisia:  Negative  Handed:  Right  AIMS (if indicated): No abnormal movements noted or reported  Assets:  Communication Skills Desire for Improvement Resilience  ADL's:  Intact  Cognition: WNL  Sleep:  good   Screenings:   Assessment and Plan:  63 year old female, history of anxiety and mood disorder.  Describes anxiety disorder/history of excessive worrying and some panic attacks as main psychiatric issues she has dealt with overtime.  Currently states she is doing well, functioning well in her daily activities, with much improved anxiety symptoms and mood symptoms.  At this time presents euthymic with a full range of affect, denies neurovegetative symptoms of depression, describes increased satisfaction in life and improving sense of self-esteem.  She is currently on Lexapro and BuSpar which she states she is tolerating well and has been very helpful.  We reviewed side effects, including potential risk for serotonin syndrome. Continue Lexapro 20 mg daily, BuSpar 30 mg twice a day.  Patient to continue seeing her outpatient therapist whom she is currently visiting twice a month.  I will see her again in 3 months, she agrees to contact me sooner should to be any worsening or concerns prior   Jenne Campus, MD 05/25/2018, 2:19 PM

## 2018-05-26 ENCOUNTER — Ambulatory Visit (INDEPENDENT_AMBULATORY_CARE_PROVIDER_SITE_OTHER): Payer: Self-pay | Admitting: Psychology

## 2018-05-26 DIAGNOSIS — F332 Major depressive disorder, recurrent severe without psychotic features: Secondary | ICD-10-CM

## 2018-06-09 ENCOUNTER — Ambulatory Visit (INDEPENDENT_AMBULATORY_CARE_PROVIDER_SITE_OTHER): Payer: Self-pay | Admitting: Psychology

## 2018-06-09 DIAGNOSIS — F332 Major depressive disorder, recurrent severe without psychotic features: Secondary | ICD-10-CM

## 2018-06-23 ENCOUNTER — Ambulatory Visit: Payer: Self-pay | Admitting: Psychology

## 2018-07-07 ENCOUNTER — Ambulatory Visit: Payer: Self-pay | Admitting: Psychology

## 2018-07-21 ENCOUNTER — Ambulatory Visit (INDEPENDENT_AMBULATORY_CARE_PROVIDER_SITE_OTHER): Payer: Self-pay | Admitting: Psychology

## 2018-07-21 DIAGNOSIS — F332 Major depressive disorder, recurrent severe without psychotic features: Secondary | ICD-10-CM

## 2018-08-04 ENCOUNTER — Ambulatory Visit (INDEPENDENT_AMBULATORY_CARE_PROVIDER_SITE_OTHER): Payer: Self-pay | Admitting: Psychology

## 2018-08-04 DIAGNOSIS — F332 Major depressive disorder, recurrent severe without psychotic features: Secondary | ICD-10-CM

## 2018-08-18 ENCOUNTER — Ambulatory Visit: Payer: Self-pay | Admitting: Psychology

## 2018-09-01 ENCOUNTER — Ambulatory Visit (INDEPENDENT_AMBULATORY_CARE_PROVIDER_SITE_OTHER): Payer: Self-pay | Admitting: Psychology

## 2018-09-01 DIAGNOSIS — F332 Major depressive disorder, recurrent severe without psychotic features: Secondary | ICD-10-CM

## 2018-09-15 ENCOUNTER — Ambulatory Visit: Payer: Self-pay | Admitting: Psychology

## 2018-09-16 ENCOUNTER — Telehealth: Payer: Self-pay | Admitting: Neurology

## 2018-09-16 NOTE — Telephone Encounter (Signed)
Due to current COVID 19 pandemic, our office is severely reducing in office visits until further notice, in order to minimize the risk to our patients and healthcare providers.   Called patient to offer virtual visit for 5/18 appt. Patient accepted and verbalized understanding of the doxy process. I have sent patient an e-mail with link and instructions, as well as my name and office number/hours. Patient understands that she will receive a call from RN prior to appt.  Pt understands that although there may be some limitations with this type of visit, we will take all precautions to reduce any security or privacy concerns.  Pt understands that this will be treated like an in office visit and we will file with pt's insurance, and there may be a patient responsible charge related to this service.

## 2018-09-17 NOTE — Telephone Encounter (Signed)
I contacted the pt and left a vm requesting a call back to complete pre charting for 09/21/18 visits. [facility] hours and # provided.

## 2018-09-21 ENCOUNTER — Other Ambulatory Visit: Payer: Self-pay

## 2018-09-21 ENCOUNTER — Encounter: Payer: Self-pay | Admitting: Neurology

## 2018-09-21 ENCOUNTER — Ambulatory Visit (INDEPENDENT_AMBULATORY_CARE_PROVIDER_SITE_OTHER): Payer: Self-pay | Admitting: Neurology

## 2018-09-21 DIAGNOSIS — G43019 Migraine without aura, intractable, without status migrainosus: Secondary | ICD-10-CM

## 2018-09-21 NOTE — Progress Notes (Signed)
     Virtual Visit via Video Note  I connected with Sharon Cole on 09/21/18 at 10:00 AM EDT by a video enabled telemedicine application and verified that I am speaking with the correct person using two identifiers.  Location: Patient: The patient is at home. Provider: Physician in office.   I discussed the limitations of evaluation and management by telemedicine and the availability of in person appointments. The patient expressed understanding and agreed to proceed.  History of Present Illness: Sharon Cole is a 63 year old right-handed white female with a history of migraine headache.  The patient has done relatively well on the Topamax, her headaches are about 2 or 3 times a month, less than once a month or the headaches incapacitating.  She oftentimes wake up with a headache, she is able to take Maxalt which seems to help.  Over-the-counter medications have not been very effective for her.  The patient tolerates Topamax well.  She reports no other new medical issues that have come up since last seen.   Observations/Objective: The video evaluation reveals the patient is alert and cooperative.  She has good facial symmetry, speech is well enunciated, not aphasic or dysarthric.  Extraocular movements are full.  Range move the neck is full.  She has good finger-nose-finger and heel shin bilaterally.  Gait is normal.  Tandem gait is normal.  Romberg is negative, no drift is seen.  Assessment and Plan: 1.  Common migraine headache  The patient is doing well with the Topamax with her migraine.  She will continue the medication for now, she will follow-up in about 6 months, Maxalt continues to be effective in controlling the headache when they do occur.  Follow Up Instructions: 68-month follow-up, may see nurse practitioner.   I discussed the assessment and treatment plan with the patient. The patient was provided an opportunity to ask questions and all were answered. The patient  agreed with the plan and demonstrated an understanding of the instructions.   The patient was advised to call back or seek an in-person evaluation if the symptoms worsen or if the condition fails to improve as anticipated.  I provided 15 minutes of non-face-to-face time during this encounter.   Kathrynn Ducking, MD

## 2018-09-29 ENCOUNTER — Ambulatory Visit (INDEPENDENT_AMBULATORY_CARE_PROVIDER_SITE_OTHER): Payer: Self-pay | Admitting: Psychology

## 2018-09-29 DIAGNOSIS — F332 Major depressive disorder, recurrent severe without psychotic features: Secondary | ICD-10-CM

## 2018-10-05 ENCOUNTER — Other Ambulatory Visit (HOSPITAL_COMMUNITY): Payer: Self-pay | Admitting: Psychiatry

## 2018-10-05 DIAGNOSIS — F411 Generalized anxiety disorder: Secondary | ICD-10-CM

## 2018-10-05 DIAGNOSIS — F331 Major depressive disorder, recurrent, moderate: Secondary | ICD-10-CM

## 2018-10-07 ENCOUNTER — Telehealth: Payer: Self-pay | Admitting: Neurology

## 2018-10-07 NOTE — Telephone Encounter (Signed)
LVM to schedule 6 month follow-up with Judson Roch NP per Dr. Jannifer Franklin.

## 2018-10-13 ENCOUNTER — Ambulatory Visit (INDEPENDENT_AMBULATORY_CARE_PROVIDER_SITE_OTHER): Payer: Self-pay | Admitting: Psychology

## 2018-10-13 DIAGNOSIS — F332 Major depressive disorder, recurrent severe without psychotic features: Secondary | ICD-10-CM

## 2018-10-16 ENCOUNTER — Other Ambulatory Visit (HOSPITAL_COMMUNITY): Payer: Self-pay

## 2018-10-16 DIAGNOSIS — F411 Generalized anxiety disorder: Secondary | ICD-10-CM

## 2018-10-16 DIAGNOSIS — F331 Major depressive disorder, recurrent, moderate: Secondary | ICD-10-CM

## 2018-10-16 MED ORDER — BUSPIRONE HCL 30 MG PO TABS: 30.0000 mg | ORAL_TABLET | Freq: Two times a day (BID) | ORAL | 0 refills | Status: DC

## 2018-10-16 MED ORDER — ESCITALOPRAM OXALATE 20 MG PO TABS: 20.0000 mg | ORAL_TABLET | Freq: Every day | ORAL | 0 refills | Status: DC

## 2018-10-16 NOTE — Progress Notes (Unsigned)
I sent in a refill of 30 days and made patient a follow up appointment.

## 2018-10-27 ENCOUNTER — Ambulatory Visit: Payer: Self-pay | Admitting: Psychology

## 2018-11-05 ENCOUNTER — Other Ambulatory Visit (HOSPITAL_COMMUNITY): Payer: Self-pay | Admitting: Psychiatry

## 2018-11-05 DIAGNOSIS — F331 Major depressive disorder, recurrent, moderate: Secondary | ICD-10-CM

## 2018-11-05 DIAGNOSIS — F411 Generalized anxiety disorder: Secondary | ICD-10-CM

## 2018-11-10 ENCOUNTER — Ambulatory Visit (INDEPENDENT_AMBULATORY_CARE_PROVIDER_SITE_OTHER): Payer: Self-pay | Admitting: Psychology

## 2018-11-10 DIAGNOSIS — F332 Major depressive disorder, recurrent severe without psychotic features: Secondary | ICD-10-CM

## 2018-11-11 ENCOUNTER — Other Ambulatory Visit (HOSPITAL_COMMUNITY): Payer: Self-pay

## 2018-11-11 DIAGNOSIS — F411 Generalized anxiety disorder: Secondary | ICD-10-CM

## 2018-11-11 DIAGNOSIS — F331 Major depressive disorder, recurrent, moderate: Secondary | ICD-10-CM

## 2018-11-11 MED ORDER — BUSPIRONE HCL 30 MG PO TABS: 30.0000 mg | ORAL_TABLET | Freq: Two times a day (BID) | ORAL | 0 refills | Status: DC

## 2018-11-11 MED ORDER — ESCITALOPRAM OXALATE 20 MG PO TABS: 20.0000 mg | ORAL_TABLET | Freq: Every day | ORAL | 0 refills | Status: DC

## 2018-11-17 ENCOUNTER — Ambulatory Visit (INDEPENDENT_AMBULATORY_CARE_PROVIDER_SITE_OTHER): Payer: Self-pay | Admitting: Psychiatry

## 2018-11-17 ENCOUNTER — Encounter (HOSPITAL_COMMUNITY): Payer: Self-pay | Admitting: Psychiatry

## 2018-11-17 ENCOUNTER — Other Ambulatory Visit: Payer: Self-pay

## 2018-11-17 DIAGNOSIS — F331 Major depressive disorder, recurrent, moderate: Secondary | ICD-10-CM

## 2018-11-17 DIAGNOSIS — F411 Generalized anxiety disorder: Secondary | ICD-10-CM

## 2018-11-17 DIAGNOSIS — F3342 Major depressive disorder, recurrent, in full remission: Secondary | ICD-10-CM

## 2018-11-17 MED ORDER — BUSPIRONE HCL 30 MG PO TABS: 30.0000 mg | ORAL_TABLET | Freq: Two times a day (BID) | ORAL | 2 refills | Status: DC

## 2018-11-17 MED ORDER — ESCITALOPRAM OXALATE 20 MG PO TABS: 20.0000 mg | ORAL_TABLET | Freq: Every day | ORAL | 2 refills | Status: DC

## 2018-11-17 NOTE — Progress Notes (Signed)
BH MD/PA/NP OP Progress Note  11/17/2018 10:29 AM Sharon Cole  MRN:  [id number]  Chief Complaint: Medication management appointment HPI: Appointment conducted via phone due to coronavirus epidemic restrictions.  Confirmed patient's identity via 2 identifiers.  63 year old female, history of anxiety/mood disorder.  Describes a history of excessive worrying consistent with gad as well as a history of depression. Currently reports she has been doing well.  States her mood has been stable and describes it as euthymic.  Reports her anxiety has been well controlled.  She states she is functioning well in her daily activities and enjoying her daily life. Currently does not endorse significant neurovegetative symptoms and reports energy level, appetite, sleep as within normal.  Denies anhedonia.  Denies any suicidal ideations. At this time she is continuing to take Lexapro at 20 mg daily and BuSpar at 30 mg twice a day.  Denies side effects.  States he feels these medications have been effective and well-tolerated. Visit Diagnosis: GAD, MDD in remission Past Psychiatric History:   Past Medical History:  Past Medical History:  Diagnosis Date  . Anxiety   . Bradycardia   . Common migraine with intractable migraine 07/03/2016  . Depression   . Dizziness   . Headache   . Menopause   . Syncope     Past Surgical History:  Procedure Laterality Date  . BACK SURGERY     cyst removal   . BREAST SURGERY     breast reduction  . BUNIONECTOMY    . CHOLECYSTECTOMY    . KNEE ARTHROSCOPY      Family Psychiatric History:   Family History:  Family History  Problem Relation Age of Onset  . Heart disease Mother   . Heart disease Brother   . Heart disease Maternal Grandmother   . Heart disease Maternal Grandfather   . Cancer Son        unknown    Social History:  Social History   Socioeconomic History  . Marital status: Significant Other    Spouse name: Not on file  . Number of  children: 2  . Years of education: Masters  . Highest education level: Not on file  Occupational History  . Not on file  Social Needs  . Financial resource strain: Not on file  . Food insecurity    Worry: Not on file    Inability: Not on file  . Transportation needs    Medical: Not on file    Non-medical: Not on file  Tobacco Use  . Smoking status: Never Smoker  . Smokeless tobacco: Never Used  Substance and Sexual Activity  . Alcohol use: Yes    Alcohol/week: 1.0 standard drinks    Types: 1 Glasses of wine per week    Comment: daily  . Drug use: No  . Sexual activity: Yes    Partners: Male  Lifestyle  . Physical activity    Days per week: Not on file    Minutes per session: Not on file  . Stress: Not on file  Relationships  . Social Herbalist on phone: Not on file    Gets together: Not on file    Attends religious service: Not on file    Active member of club or organization: Not on file    Attends meetings of clubs or organizations: Not on file    Relationship status: Not on file  Other Topics Concern  . Not on file  Social History Narrative  Lives   Caffeine use:    Drinks 16oz caffeine drinks a day     Allergies: No Known Allergies  Metabolic Disorder Labs: No results found for: HGBA1C, MPG No results found for: PROLACTIN Lab Results  Component Value Date   CHOL 168 01/02/2015   TRIG 91.0 01/02/2015   HDL 53.70 01/02/2015   CHOLHDL 3 01/02/2015   VLDL 18.2 01/02/2015   LDLCALC 96 01/02/2015   LDLCALC 98 08/10/2013   Lab Results  Component Value Date   TSH 1.04 01/02/2015   TSH 2.986 08/10/2013    Therapeutic Level Labs: No results found for: LITHIUM No results found for: VALPROATE No components found for:  CBMZ  Current Medications: Current Outpatient Medications  Medication Sig Dispense Refill  . busPIRone (BUSPAR) 30 MG tablet Take 1 tablet (30 mg total) by mouth 2 (two) times daily. 60 tablet 0  . calcium-vitamin D (OSCAL  WITH D) 250-125 MG-UNIT tablet Take 1 tablet by mouth daily.    Marland Kitchen escitalopram (LEXAPRO) 20 MG tablet Take 1 tablet (20 mg total) by mouth daily. 30 tablet 0  . MAGNESIUM PO Take 1 tablet by mouth daily.    . Multiple Vitamins-Minerals (MULTIVITAMIN PO) Take 1 tablet by mouth daily.    Marland Kitchen omeprazole (PRILOSEC) 10 MG capsule Take 10 mg by mouth daily.    . rizatriptan (MAXALT) 10 MG tablet Take 1 tablet (10 mg total) by mouth 3 (three) times daily as needed for migraine. 10 tablet 3  . topiramate (TOPAMAX) 50 MG tablet Take 3 tablets (150 mg total) by mouth at bedtime. Please call [phone] to schedule follow up to continue refills. 270 tablet 3   No current facility-administered medications for this visit.      Musculoskeletal: Not assessed  Psychiatric Specialty Exam: Please take into account inherent limitations in obtaining a full mental status exam in the context of phone communication ROS denies medication side effects  There were no vitals taken for this visit.There is no height or weight on file to calculate BMI.  General Appearance: NA  Eye Contact:  NA  Speech:  Normal Rate  Volume:  Normal  Mood:  Euthymic  Affect:  Full Range  Thought Process:  Goal Directed, Linear and Descriptions of Associations: Intact  Orientation:  Other:  Fully alert and attentive  Thought Content: No hallucinations, no delusions, future oriented   Suicidal Thoughts:  No denies any suicidal or self-injurious ideations  Homicidal Thoughts:  No  Memory:  Recent and remote grossly intact  Judgement:  Good  Insight:  Good  Psychomotor Activity:  NA  Concentration:  Concentration: Good and Attention Span: Good  Recall:  Good  Fund of Knowledge: Good  Language: Good  Akathisia:  Negative  Handed:  Right  AIMS (if indicated): NA  Assets:  Communication Skills Desire for Improvement Resilience  ADL's:  Intact  Cognition: WNL  Sleep:  Good   Screenings:   Assessment and Plan: 63 year old  female, history of GAD and depression.  Currently reports she is doing well, without significant symptoms and functioning well in her daily activities.  Describes her mood as improved/normal at this time and presents euthymic with a full range of affect.  No SI.  Tolerating current medication regimen (Lexapro, BuSpar) well without side effects. Plan is to renew Lexapro 20 mg daily and BuSpar 30 mg twice daily x3 months.  Next follow-up appointment in 3 months, patient agrees to contact clinic sooner should to be any concern or worsening  prior.   Jenne Campus, MD 11/17/2018, 10:29 AM

## 2018-11-24 ENCOUNTER — Ambulatory Visit: Payer: Self-pay | Admitting: Psychology

## 2018-12-07 ENCOUNTER — Other Ambulatory Visit: Payer: Self-pay | Admitting: Neurology

## 2018-12-08 ENCOUNTER — Ambulatory Visit (INDEPENDENT_AMBULATORY_CARE_PROVIDER_SITE_OTHER): Payer: Self-pay | Admitting: Psychology

## 2018-12-08 DIAGNOSIS — F332 Major depressive disorder, recurrent severe without psychotic features: Secondary | ICD-10-CM

## 2018-12-15 ENCOUNTER — Ambulatory Visit (INDEPENDENT_AMBULATORY_CARE_PROVIDER_SITE_OTHER): Payer: Self-pay | Admitting: Psychology

## 2018-12-15 DIAGNOSIS — F332 Major depressive disorder, recurrent severe without psychotic features: Secondary | ICD-10-CM

## 2018-12-21 ENCOUNTER — Telehealth: Payer: Self-pay | Admitting: Neurology

## 2018-12-21 MED ORDER — RIZATRIPTAN BENZOATE 10 MG PO TABS: 10.0000 mg | ORAL_TABLET | Freq: Three times a day (TID) | ORAL | 3 refills | Status: DC | PRN

## 2018-12-21 NOTE — Addendum Note (Signed)
Addended by: Verlin Grills T on: 12/21/2018 03:37 PM   Modules accepted: Orders

## 2018-12-21 NOTE — Telephone Encounter (Signed)
Pt is asking for a refill on her rizatriptan (MAXALT) 10 MG tablet  sue to headaches she has had for the past 2 weeks  [facility]

## 2018-12-21 NOTE — Telephone Encounter (Signed)
Chart reviewed and rx has been submitted.

## 2018-12-22 ENCOUNTER — Ambulatory Visit (INDEPENDENT_AMBULATORY_CARE_PROVIDER_SITE_OTHER): Payer: Self-pay | Admitting: Psychology

## 2018-12-22 DIAGNOSIS — F332 Major depressive disorder, recurrent severe without psychotic features: Secondary | ICD-10-CM

## 2019-01-05 ENCOUNTER — Ambulatory Visit (INDEPENDENT_AMBULATORY_CARE_PROVIDER_SITE_OTHER): Payer: Self-pay | Admitting: Psychology

## 2019-01-05 DIAGNOSIS — F3289 Other specified depressive episodes: Secondary | ICD-10-CM

## 2019-01-19 ENCOUNTER — Ambulatory Visit (INDEPENDENT_AMBULATORY_CARE_PROVIDER_SITE_OTHER): Payer: Self-pay | Admitting: Psychology

## 2019-01-19 DIAGNOSIS — F332 Major depressive disorder, recurrent severe without psychotic features: Secondary | ICD-10-CM

## 2019-02-02 ENCOUNTER — Ambulatory Visit: Payer: Self-pay | Admitting: Psychology

## 2019-02-16 ENCOUNTER — Ambulatory Visit (INDEPENDENT_AMBULATORY_CARE_PROVIDER_SITE_OTHER): Payer: Self-pay | Admitting: Psychology

## 2019-02-16 DIAGNOSIS — F332 Major depressive disorder, recurrent severe without psychotic features: Secondary | ICD-10-CM

## 2019-03-02 ENCOUNTER — Ambulatory Visit (INDEPENDENT_AMBULATORY_CARE_PROVIDER_SITE_OTHER): Payer: Self-pay | Admitting: Psychology

## 2019-03-02 DIAGNOSIS — F332 Major depressive disorder, recurrent severe without psychotic features: Secondary | ICD-10-CM

## 2019-03-09 ENCOUNTER — Other Ambulatory Visit (HOSPITAL_COMMUNITY): Payer: Self-pay | Admitting: Psychiatry

## 2019-03-09 DIAGNOSIS — F331 Major depressive disorder, recurrent, moderate: Secondary | ICD-10-CM

## 2019-03-09 DIAGNOSIS — F411 Generalized anxiety disorder: Secondary | ICD-10-CM

## 2019-03-16 ENCOUNTER — Other Ambulatory Visit (HOSPITAL_COMMUNITY): Payer: Self-pay

## 2019-03-16 ENCOUNTER — Ambulatory Visit (INDEPENDENT_AMBULATORY_CARE_PROVIDER_SITE_OTHER): Payer: Self-pay | Admitting: Psychology

## 2019-03-16 DIAGNOSIS — F331 Major depressive disorder, recurrent, moderate: Secondary | ICD-10-CM

## 2019-03-16 DIAGNOSIS — F411 Generalized anxiety disorder: Secondary | ICD-10-CM

## 2019-03-16 DIAGNOSIS — F332 Major depressive disorder, recurrent severe without psychotic features: Secondary | ICD-10-CM

## 2019-03-16 MED ORDER — BUSPIRONE HCL 30 MG PO TABS: 30.0000 mg | ORAL_TABLET | Freq: Two times a day (BID) | ORAL | 0 refills | Status: DC

## 2019-03-16 MED ORDER — ESCITALOPRAM OXALATE 20 MG PO TABS: 20.0000 mg | ORAL_TABLET | Freq: Every day | ORAL | 0 refills | Status: DC

## 2019-03-23 ENCOUNTER — Encounter (HOSPITAL_COMMUNITY): Payer: Self-pay | Admitting: Psychiatry

## 2019-03-23 ENCOUNTER — Other Ambulatory Visit: Payer: Self-pay

## 2019-03-23 ENCOUNTER — Ambulatory Visit (INDEPENDENT_AMBULATORY_CARE_PROVIDER_SITE_OTHER): Payer: Self-pay | Admitting: Psychiatry

## 2019-03-23 DIAGNOSIS — F331 Major depressive disorder, recurrent, moderate: Secondary | ICD-10-CM

## 2019-03-23 DIAGNOSIS — F3342 Major depressive disorder, recurrent, in full remission: Secondary | ICD-10-CM

## 2019-03-23 DIAGNOSIS — F411 Generalized anxiety disorder: Secondary | ICD-10-CM

## 2019-03-23 MED ORDER — ESCITALOPRAM OXALATE 20 MG PO TABS: 20.0000 mg | ORAL_TABLET | Freq: Every day | ORAL | 2 refills | Status: DC

## 2019-03-23 MED ORDER — BUSPIRONE HCL 30 MG PO TABS: 30.0000 mg | ORAL_TABLET | Freq: Two times a day (BID) | ORAL | 2 refills | Status: DC

## 2019-03-23 NOTE — Progress Notes (Signed)
[facility] MD/PA/NP OP Progress Note  03/23/2019 11:31 AM Sharon Cole  MRN:  [id number]  Chief Complaint: Medication management appointment HPI: This medication management appointment was held via phone, due to Covid epidemic restrictions.  Patient's identity confirmed with 2 different identifiers.  Limitations associated with this type of communication have been reviewed. 63 year old female history of depression and anxiety.  Has been diagnosed with GAD. At this time reports she is doing well.  Reports that recently had been experiencing increased anxiety and some depression in the context of her adult son having some difficulties/behavioral issues.  Fortunately these issues have now resolved and states "he has been doing a lot better lately" which in turn has resulted in her feeling better/less anxious.  At this time minimizes depression or neurovegetative symptoms and presents euthymic.  No SI.  Also describes her anxiety symptoms are well controlled. She is tolerating current medication regimen well, denies side effects.  (On Lexapro 20 mg daily and BuSpar 30 mg twice daily). She also reports she has been taking melatonin supplement for sleep.  Describes a somewhat disorganized sleep-wake cycle.  States she stays up late sometimes up to 1 or 2 AM, waking up at around 8 AM on most days.  We reviewed sleep hygiene techniques and establishing a sleep schedule/routine. Visit Diagnosis: GAD by history  Past Psychiatric History:   Past Medical History:  Past Medical History:  Diagnosis Date  . Anxiety   . Bradycardia   . Common migraine with intractable migraine 07/03/2016  . Depression   . Dizziness   . Headache   . Menopause   . Syncope     Past Surgical History:  Procedure Laterality Date  . BACK SURGERY     cyst removal   . BREAST SURGERY     breast reduction  . BUNIONECTOMY    . CHOLECYSTECTOMY    . KNEE ARTHROSCOPY      Family Psychiatric History:   Family History:  Family  History  Problem Relation Age of Onset  . Heart disease Mother   . Heart disease Brother   . Heart disease Maternal Grandmother   . Heart disease Maternal Grandfather   . Cancer Son        unknown    Social History:  Social History   Socioeconomic History  . Marital status: Significant Other    Spouse name: Not on file  . Number of children: 2  . Years of education: Masters  . Highest education level: Not on file  Occupational History  . Not on file  Social Needs  . Financial resource strain: Not on file  . Food insecurity    Worry: Not on file    Inability: Not on file  . Transportation needs    Medical: Not on file    Non-medical: Not on file  Tobacco Use  . Smoking status: Never Smoker  . Smokeless tobacco: Never Used  Substance and Sexual Activity  . Alcohol use: Yes    Alcohol/week: 1.0 standard drinks    Types: 1 Glasses of wine per week    Comment: daily  . Drug use: No  . Sexual activity: Yes    Partners: Male  Lifestyle  . Physical activity    Days per week: Not on file    Minutes per session: Not on file  . Stress: Not on file  Relationships  . Social Herbalist on phone: Not on file    Gets together: Not on  file    Attends religious service: Not on file    Active member of club or organization: Not on file    Attends meetings of clubs or organizations: Not on file    Relationship status: Not on file  Other Topics Concern  . Not on file  Social History Narrative   Lives   Caffeine use:    Drinks 16oz caffeine drinks a day     Allergies: No Known Allergies  Metabolic Disorder Labs: No results found for: HGBA1C, MPG No results found for: PROLACTIN Lab Results  Component Value Date   CHOL 168 01/02/2015   TRIG 91.0 01/02/2015   HDL 53.70 01/02/2015   CHOLHDL 3 01/02/2015   VLDL 18.2 01/02/2015   LDLCALC 96 01/02/2015   LDLCALC 98 08/10/2013   Lab Results  Component Value Date   TSH 1.04 01/02/2015   TSH 2.986 08/10/2013     Therapeutic Level Labs: No results found for: LITHIUM No results found for: VALPROATE No components found for:  CBMZ  Current Medications: Current Outpatient Medications  Medication Sig Dispense Refill  . busPIRone (BUSPAR) 30 MG tablet Take 1 tablet (30 mg total) by mouth 2 (two) times daily. 60 tablet 0  . calcium-vitamin D (OSCAL WITH D) 250-125 MG-UNIT tablet Take 1 tablet by mouth daily.    Marland Kitchen escitalopram (LEXAPRO) 20 MG tablet Take 1 tablet (20 mg total) by mouth daily. 30 tablet 0  . MAGNESIUM PO Take 1 tablet by mouth daily.    . Multiple Vitamins-Minerals (MULTIVITAMIN PO) Take 1 tablet by mouth daily.    Marland Kitchen omeprazole (PRILOSEC) 10 MG capsule Take 10 mg by mouth daily.    . rizatriptan (MAXALT) 10 MG tablet Take 1 tablet (10 mg total) by mouth 3 (three) times daily as needed for migraine. 10 tablet 3  . topiramate (TOPAMAX) 50 MG tablet TAKE 3 TABLETS (150 MG TOTAL) BY MOUTH AT BEDTIME. 90 tablet 5   No current facility-administered medications for this visit.        Psychiatric Specialty Exam: Please take into account limitations associated with obtaining a full mental status exam in the context of phone communication ROS denies medication side effects  There were no vitals taken for this visit.There is no height or weight on file to calculate BMI.  General Appearance: NA  Eye Contact:  NA  Speech:  Normal Rate  Volume:  Normal  Mood:  Reports improved mood and currently appears euthymic  Affect:  Appropriate, reactive  Thought Process:  Linear and Descriptions of Associations: Intact  Orientation:  Other:  Fully alert and attentive  Thought Content: No hallucinations, no delusions expressed   Suicidal Thoughts:  No denies any suicidal or self-injurious ideations  Homicidal Thoughts:  No  Memory:  Recent and remote grossly intact  Judgement:  Other:  Present  Insight:  Present  Psychomotor Activity:  NA  Concentration:  Concentration: Good and Attention Span:  Good  Recall:  Good  Fund of Knowledge: Good  Language: Good  Akathisia:  Negative  Handed:  Right  AIMS (if indicated): Not done  Assets:  Communication Skills Desire for Improvement Resilience  ADL's:  Intact  Cognition: WNL  Sleep:  Fair   Screenings:   Assessment and Plan:  63 year old female with a history of GERD and depression.  Reports she is currently doing well, tolerating medications well (Lexapro/BuSpar).  States she had been more anxious recently in the context of concerns about her adult son's behavior.  However,  states that this issue is now resolved, son is doing well at this time.  She describes she is currently stable, presents euthymic, no SI. We will continue current medication regimen-Lexapro 20 mg daily, BuSpar 30 mg twice daily.  We will see patient in 3 months, agrees to contact clinic sooner should to be any worsening or concern prior.   Jenne Campus, MD 03/23/2019, 11:31 AM

## 2019-03-30 ENCOUNTER — Ambulatory Visit (INDEPENDENT_AMBULATORY_CARE_PROVIDER_SITE_OTHER): Payer: Self-pay | Admitting: Psychology

## 2019-03-30 DIAGNOSIS — F332 Major depressive disorder, recurrent severe without psychotic features: Secondary | ICD-10-CM

## 2019-04-13 ENCOUNTER — Ambulatory Visit (INDEPENDENT_AMBULATORY_CARE_PROVIDER_SITE_OTHER): Payer: Self-pay | Admitting: Psychology

## 2019-04-13 DIAGNOSIS — F332 Major depressive disorder, recurrent severe without psychotic features: Secondary | ICD-10-CM

## 2019-04-27 ENCOUNTER — Ambulatory Visit (INDEPENDENT_AMBULATORY_CARE_PROVIDER_SITE_OTHER): Payer: Self-pay | Admitting: Psychology

## 2019-04-27 DIAGNOSIS — F332 Major depressive disorder, recurrent severe without psychotic features: Secondary | ICD-10-CM

## 2019-05-11 ENCOUNTER — Ambulatory Visit (INDEPENDENT_AMBULATORY_CARE_PROVIDER_SITE_OTHER): Payer: BC Managed Care – PPO | Admitting: Psychology

## 2019-05-11 DIAGNOSIS — F332 Major depressive disorder, recurrent severe without psychotic features: Secondary | ICD-10-CM

## 2019-05-24 ENCOUNTER — Ambulatory Visit (INDEPENDENT_AMBULATORY_CARE_PROVIDER_SITE_OTHER): Payer: BC Managed Care – PPO | Admitting: Psychiatry

## 2019-05-24 ENCOUNTER — Other Ambulatory Visit: Payer: Self-pay

## 2019-05-24 ENCOUNTER — Encounter (HOSPITAL_COMMUNITY): Payer: Self-pay | Admitting: Psychiatry

## 2019-05-24 DIAGNOSIS — F331 Major depressive disorder, recurrent, moderate: Secondary | ICD-10-CM

## 2019-05-24 DIAGNOSIS — F411 Generalized anxiety disorder: Secondary | ICD-10-CM

## 2019-05-24 DIAGNOSIS — F3341 Major depressive disorder, recurrent, in partial remission: Secondary | ICD-10-CM

## 2019-05-24 MED ORDER — BUSPIRONE HCL 30 MG PO TABS: 30.0000 mg | ORAL_TABLET | Freq: Two times a day (BID) | ORAL | 1 refills | Status: DC

## 2019-05-24 MED ORDER — ARIPIPRAZOLE 2 MG PO TABS: 2.0000 mg | ORAL_TABLET | Freq: Every day | ORAL | 1 refills | Status: DC

## 2019-05-24 MED ORDER — ESCITALOPRAM OXALATE 20 MG PO TABS: 20.0000 mg | ORAL_TABLET | Freq: Every day | ORAL | 1 refills | Status: DC

## 2019-05-24 NOTE — Progress Notes (Signed)
[facility] MD/PA/NP OP Progress Note  05/24/2019 4:33 PM Sharon Cole  MRN:  [id number]  Chief Complaint: Medication management appointment HPI: This appointment was provided via telephone communication due to Covid epidemic related precautions.  Patient identity confirmed with 2 different identifiers.  Limitations associated with this mode of medication have been reviewed.\ 64 year old female with a history of anxiety and depression, has been diagnosed with MDD and gad in the past. Currently reports doing relatively well although describes some increased mood symptoms (vague depression, some anhedonia) and anxiety.  Attributes this to financial and relationship stressors.  Overall she does feel her medications have been effective and well-tolerated (on Lexapro and BuSpar). She denies any suicidal thoughts, no psychotic symptoms. We discussed options to include switching antidepressants or looking into augmentation strategies.  She prefers the latter, as states that Lexapro has been well-tolerated and effective overall. Visit Diagnosis: MDD/GAD by history Past Psychiatric History:   Past Medical History:  Past Medical History:  Diagnosis Date  . Anxiety   . Bradycardia   . Common migraine with intractable migraine 07/03/2016  . Depression   . Dizziness   . Headache   . Menopause   . Syncope     Past Surgical History:  Procedure Laterality Date  . BACK SURGERY     cyst removal   . BREAST SURGERY     breast reduction  . BUNIONECTOMY    . CHOLECYSTECTOMY    . KNEE ARTHROSCOPY      Family Psychiatric History:   Family History:  Family History  Problem Relation Age of Onset  . Heart disease Mother   . Heart disease Brother   . Heart disease Maternal Grandmother   . Heart disease Maternal Grandfather   . Cancer Son        unknown    Social History:  Social History   Socioeconomic History  . Marital status: Significant Other    Spouse name: Not on file  . Number of  children: 2  . Years of education: Masters  . Highest education level: Not on file  Occupational History  . Not on file  Tobacco Use  . Smoking status: Never Smoker  . Smokeless tobacco: Never Used  Substance and Sexual Activity  . Alcohol use: Yes    Alcohol/week: 1.0 standard drinks    Types: 1 Glasses of wine per week    Comment: daily  . Drug use: No  . Sexual activity: Yes    Partners: Male  Other Topics Concern  . Not on file  Social History Narrative   Lives   Caffeine use:    Drinks 16oz caffeine drinks a day    Social Determinants of Health   Financial Resource Strain:   . Difficulty of Paying Living Expenses: Not on file  Food Insecurity:   . Worried About Charity fundraiser in the Last Year: Not on file  . Ran Out of Food in the Last Year: Not on file  Transportation Needs:   . Lack of Transportation (Medical): Not on file  . Lack of Transportation (Non-Medical): Not on file  Physical Activity:   . Days of Exercise per Week: Not on file  . Minutes of Exercise per Session: Not on file  Stress:   . Feeling of Stress : Not on file  Social Connections:   . Frequency of Communication with Friends and Family: Not on file  . Frequency of Social Gatherings with Friends and Family: Not on file  .  Attends Religious Services: Not on file  . Active Member of Clubs or Organizations: Not on file  . Attends Archivist Meetings: Not on file  . Marital Status: Not on file    Allergies: No Known Allergies  Metabolic Disorder Labs: No results found for: HGBA1C, MPG No results found for: PROLACTIN Lab Results  Component Value Date   CHOL 168 01/02/2015   TRIG 91.0 01/02/2015   HDL 53.70 01/02/2015   CHOLHDL 3 01/02/2015   VLDL 18.2 01/02/2015   LDLCALC 96 01/02/2015   LDLCALC 98 08/10/2013   Lab Results  Component Value Date   TSH 1.04 01/02/2015   TSH 2.986 08/10/2013    Therapeutic Level Labs: No results found for: LITHIUM No results found  for: VALPROATE No components found for:  CBMZ  Current Medications: Current Outpatient Medications  Medication Sig Dispense Refill  . busPIRone (BUSPAR) 30 MG tablet Take 1 tablet (30 mg total) by mouth 2 (two) times daily. 60 tablet 2  . calcium-vitamin D (OSCAL WITH D) 250-125 MG-UNIT tablet Take 1 tablet by mouth daily.    Marland Kitchen escitalopram (LEXAPRO) 20 MG tablet Take 1 tablet (20 mg total) by mouth daily. 30 tablet 2  . MAGNESIUM PO Take 1 tablet by mouth daily.    . Multiple Vitamins-Minerals (MULTIVITAMIN PO) Take 1 tablet by mouth daily.    Marland Kitchen omeprazole (PRILOSEC) 10 MG capsule Take 10 mg by mouth daily.    . rizatriptan (MAXALT) 10 MG tablet Take 1 tablet (10 mg total) by mouth 3 (three) times daily as needed for migraine. 10 tablet 3  . topiramate (TOPAMAX) 50 MG tablet TAKE 3 TABLETS (150 MG TOTAL) BY MOUTH AT BEDTIME. 90 tablet 5   No current facility-administered medications for this visit.     Musculoskeletal: Strength & Muscle Tone: N/A Gait & Station: N/A Patient leans: N/A  Psychiatric Specialty Exam: Please take into account limitations obtaining a full mental status exam in the context of phone based appointment Review of Systems does not endorse SSRI/buspirone side effects  There were no vitals taken for this visit.There is no height or weight on file to calculate BMI.  General Appearance: NA  Eye Contact:  NA  Speech:  Normal Rate  Volume:  Normal  Mood: Reports some residual depression and anxiety  Affect:  Appropriate  Thought Process:  Linear and Descriptions of Associations: Intact  Orientation:  Other:  Fully alert and attentive  Thought Content: No hallucinations, no delusions   Suicidal Thoughts:  No denies any suicidal or self-injurious ideations, also denies any homicidal or violent ideations  Homicidal Thoughts:  No  Memory:  Recent and remote grossly intact  Judgement:  Other:  Present  Insight:  Present  Psychomotor Activity:  NA  Concentration:   Concentration: Good and Attention Span: Good  Recall:  Good  Fund of Knowledge: Good  Language: Good  Akathisia:  Negative  Handed:  Right  AIMS (if indicated):   Assets:  Desire for Improvement Resilience  ADL's:  Intact  Cognition: WNL  Sleep:  Good   Assessment/ Plan : Patient reports overall improvement but describes lingering depression and some anxiety in the context of financial and relationship stressors.  We discussed treatment options and prefers to continue her current medication regimen which she is tolerating well.  Her medications are Lexapro 20 mg daily and BuSpar 30 mg twice a day.  She is interested in augmentation options.  We discussed low-dose Abilify.  Agrees to this  approach.  We have reviewed Abilify side effects as well as potential risk for serotonin syndrome and we have reviewed what serotonin syndrome's symptoms/presentation may be. Continue Lexapro 20 mg daily, BuSpar 30 mg twice daily, start Abilify 2 mg daily for augmentation. # 1 month, one refill We also discussed obtaining a TSH serum level.  She states she recently had blood work done by her PCP and will look into other this particular lab was ordered at the time, otherwise will have it done via her PCP. Next appointment in 1 month, patient agrees to contact me sooner should to be any concern or worsening prior     Jenne Campus, MD 05/24/2019, 4:33 PM

## 2019-05-25 ENCOUNTER — Ambulatory Visit (INDEPENDENT_AMBULATORY_CARE_PROVIDER_SITE_OTHER): Payer: BC Managed Care – PPO | Admitting: Psychology

## 2019-05-25 DIAGNOSIS — F332 Major depressive disorder, recurrent severe without psychotic features: Secondary | ICD-10-CM

## 2019-06-08 ENCOUNTER — Ambulatory Visit (INDEPENDENT_AMBULATORY_CARE_PROVIDER_SITE_OTHER): Payer: BLUE CROSS/BLUE SHIELD | Admitting: Psychology

## 2019-06-08 ENCOUNTER — Other Ambulatory Visit: Payer: Self-pay | Admitting: Neurology

## 2019-06-08 DIAGNOSIS — F332 Major depressive disorder, recurrent severe without psychotic features: Secondary | ICD-10-CM

## 2019-06-10 ENCOUNTER — Other Ambulatory Visit: Payer: Self-pay | Admitting: Neurology

## 2019-06-22 ENCOUNTER — Ambulatory Visit (INDEPENDENT_AMBULATORY_CARE_PROVIDER_SITE_OTHER): Payer: BLUE CROSS/BLUE SHIELD | Admitting: Psychology

## 2019-06-22 DIAGNOSIS — F332 Major depressive disorder, recurrent severe without psychotic features: Secondary | ICD-10-CM | POA: Diagnosis not present

## 2019-07-06 ENCOUNTER — Ambulatory Visit (INDEPENDENT_AMBULATORY_CARE_PROVIDER_SITE_OTHER): Payer: BLUE CROSS/BLUE SHIELD | Admitting: Psychology

## 2019-07-06 DIAGNOSIS — F332 Major depressive disorder, recurrent severe without psychotic features: Secondary | ICD-10-CM | POA: Diagnosis not present

## 2019-07-08 DIAGNOSIS — Z860100 Personal history of colon polyps, unspecified: Secondary | ICD-10-CM

## 2019-07-08 DIAGNOSIS — R141 Gas pain: Secondary | ICD-10-CM

## 2019-07-08 DIAGNOSIS — Z9884 Bariatric surgery status: Secondary | ICD-10-CM | POA: Insufficient documentation

## 2019-07-08 HISTORY — DX: Gas pain: R14.1

## 2019-07-08 HISTORY — DX: Bariatric surgery status: Z98.84

## 2019-07-08 HISTORY — DX: Personal history of colon polyps, unspecified: Z86.0100

## 2019-07-13 ENCOUNTER — Other Ambulatory Visit (HOSPITAL_COMMUNITY): Payer: Self-pay | Admitting: Psychiatry

## 2019-07-15 ENCOUNTER — Other Ambulatory Visit: Payer: Self-pay | Admitting: Internal Medicine

## 2019-07-15 DIAGNOSIS — Z1231 Encounter for screening mammogram for malignant neoplasm of breast: Secondary | ICD-10-CM

## 2019-07-20 ENCOUNTER — Ambulatory Visit (INDEPENDENT_AMBULATORY_CARE_PROVIDER_SITE_OTHER): Payer: BLUE CROSS/BLUE SHIELD | Admitting: Psychology

## 2019-07-20 DIAGNOSIS — F332 Major depressive disorder, recurrent severe without psychotic features: Secondary | ICD-10-CM | POA: Diagnosis not present

## 2019-07-21 ENCOUNTER — Ambulatory Visit (INDEPENDENT_AMBULATORY_CARE_PROVIDER_SITE_OTHER): Payer: BLUE CROSS/BLUE SHIELD | Admitting: Neurology

## 2019-07-21 ENCOUNTER — Other Ambulatory Visit: Payer: Self-pay

## 2019-07-21 ENCOUNTER — Encounter: Payer: Self-pay | Admitting: Neurology

## 2019-07-21 ENCOUNTER — Other Ambulatory Visit (HOSPITAL_COMMUNITY): Payer: Self-pay | Admitting: *Deleted

## 2019-07-21 VITALS — BP 91/63 | HR 53 | Temp 98.4°F | Ht 64.0 in | Wt 144.8 lb

## 2019-07-21 DIAGNOSIS — G43019 Migraine without aura, intractable, without status migrainosus: Secondary | ICD-10-CM

## 2019-07-21 MED ORDER — TOPIRAMATE 50 MG PO TABS: 150.0000 mg | ORAL_TABLET | Freq: Every day | ORAL | 3 refills | Status: DC

## 2019-07-21 MED ORDER — DEXAMETHASONE 2 MG PO TABS: ORAL_TABLET | ORAL | 0 refills | Status: DC

## 2019-07-21 MED ORDER — RIZATRIPTAN BENZOATE 10 MG PO TABS: ORAL_TABLET | ORAL | 5 refills | Status: DC

## 2019-07-21 MED ORDER — ARIPIPRAZOLE 2 MG PO TABS: 2.0000 mg | ORAL_TABLET | Freq: Every day | ORAL | 1 refills | Status: DC

## 2019-07-21 NOTE — Patient Instructions (Signed)
Continue the Topamax, Maxalt I will give you Decadron 2 mg, take 3, then take 2, then 1 taper if needed for prolonged headache cycle  See you back in 6 months

## 2019-07-21 NOTE — Progress Notes (Signed)
PATIENT: Sharon Cole DOB: [birth year]  REASON FOR VISIT: follow up HISTORY FROM: patient  HISTORY OF PRESENT ILLNESS: Today 07/21/19  Sharon Cole is a 64 year old female with history of migraine headache.  She is on Topamax, Maxalt as needed.  The Topamax has done well for her, for the several weeks, has had an increase in headache, maybe 5-10 a month.  Her headaches are generally frontal, with photophobia, phonophobia if the headache becomes significant.  She has been using her supply of Maxalt.  She cannot identify any triggers.  She does not take frequent over-the-counter medication.  For the last 4 days, she has had a moderate headache.  She denies any new problems or concerns.  She has been placed on Abilify from her psychiatrist.  She presents today for evaluation unaccompanied.  HISTORY 09/21/2018 Dr. Jannifer Franklin: Sharon Cole is a 64 year old right-handed white female with a history of migraine headache.  The patient has done relatively well on the Topamax, her headaches are about 2 or 3 times a month, less than once a month or the headaches incapacitating.  She oftentimes wake up with a headache, she is able to take Maxalt which seems to help.  Over-the-counter medications have not been very effective for her.  The patient tolerates Topamax well.  She reports no other new medical issues that have come up since last seen.   REVIEW OF SYSTEMS: Out of a complete 14 system review of symptoms, the patient complains only of the following symptoms, and all other reviewed systems are negative.  Headache  ALLERGIES: No Known Allergies  HOME MEDICATIONS: Outpatient Medications Prior to Visit  Medication Sig Dispense Refill  . ARIPiprazole (ABILIFY) 2 MG tablet Take 1 tablet (2 mg total) by mouth daily. 30 tablet 1  . busPIRone (BUSPAR) 30 MG tablet Take 1 tablet (30 mg total) by mouth 2 (two) times daily. 60 tablet 1  . calcium-vitamin D (OSCAL WITH D) 250-125 MG-UNIT tablet Take 1  tablet by mouth daily.    Marland Kitchen escitalopram (LEXAPRO) 20 MG tablet Take 1 tablet (20 mg total) by mouth daily. 30 tablet 1  . MAGNESIUM PO Take 1 tablet by mouth daily.    . Multiple Vitamins-Minerals (MULTIVITAMIN PO) Take 1 tablet by mouth daily.    Marland Kitchen omeprazole (PRILOSEC) 10 MG capsule Take 10 mg by mouth daily.    . rizatriptan (MAXALT) 10 MG tablet TAKE 1 TABLET BY MOUTH 3 TIMES A DAY AS NEEDED FOR MIGRAINE 10 tablet 3  . topiramate (TOPAMAX) 50 MG tablet Take 3 tablets (150 mg total) by mouth at bedtime. Please call [phone] to schedule a follow up appt. 90 tablet 0  . ARIPiprazole (ABILIFY) 2 MG tablet     . busPIRone (BUSPAR) 30 MG tablet Take by mouth.    . topiramate (TOPAMAX) 50 MG tablet Take by mouth.     No facility-administered medications prior to visit.    PAST MEDICAL HISTORY: Past Medical History:  Diagnosis Date  . Anxiety   . Bradycardia   . Common migraine with intractable migraine 07/03/2016  . Depression   . Dizziness   . Headache   . Menopause   . Syncope     PAST SURGICAL HISTORY: Past Surgical History:  Procedure Laterality Date  . BACK SURGERY     cyst removal   . BREAST SURGERY     breast reduction  . BUNIONECTOMY    . CHOLECYSTECTOMY    . KNEE ARTHROSCOPY  FAMILY HISTORY: Family History  Problem Relation Age of Onset  . Heart disease Mother   . Heart disease Brother   . Heart disease Maternal Grandmother   . Heart disease Maternal Grandfather   . Cancer Son        unknown    SOCIAL HISTORY: Social History   Socioeconomic History  . Marital status: Significant Other    Spouse name: Not on file  . Number of children: 2  . Years of education: Masters  . Highest education level: Not on file  Occupational History  . Not on file  Tobacco Use  . Smoking status: Never Smoker  . Smokeless tobacco: Never Used  Substance and Sexual Activity  . Alcohol use: Yes    Alcohol/week: 1.0 standard drinks    Types: 1 Glasses of wine per  week    Comment: daily  . Drug use: No  . Sexual activity: Yes    Partners: Male  Other Topics Concern  . Not on file  Social History Narrative   Lives   Caffeine use:    Drinks 16oz caffeine drinks a day    Social Determinants of Radio broadcast assistant Strain:   . Difficulty of Paying Living Expenses:   Food Insecurity:   . Worried About Charity fundraiser in the Last Year:   . Arboriculturist in the Last Year:   Transportation Needs:   . Film/video editor (Medical):   Marland Kitchen Lack of Transportation (Non-Medical):   Physical Activity:   . Days of Exercise per Week:   . Minutes of Exercise per Session:   Stress:   . Feeling of Stress :   Social Connections:   . Frequency of Communication with Friends and Family:   . Frequency of Social Gatherings with Friends and Family:   . Attends Religious Services:   . Active Member of Clubs or Organizations:   . Attends Archivist Meetings:   Marland Kitchen Marital Status:   Intimate Partner Violence:   . Fear of Current or Ex-Partner:   . Emotionally Abused:   Marland Kitchen Physically Abused:   . Sexually Abused:    PHYSICAL EXAM  Vitals:   07/21/19 1425  BP: 91/63  Pulse: (!) 53  Temp: 98.4 F (36.9 C)  Weight: 144 lb 12.8 oz (65.7 kg)  Height: 5\' 4"  (1.626 m)   Body mass index is 24.85 kg/m.  Generalized: Well developed, in no acute distress   Neurological examination  Mentation: Alert oriented to time, place, history taking. Follows all commands speech and language fluent Cranial nerve II-XII: Pupils were equal round reactive to light. Extraocular movements were full, visual field were full on confrontational test. Facial sensation and strength were normal.  Head turning and shoulder shrug  were normal and symmetric. Motor: The motor testing reveals 5 over 5 strength of all 4 extremities. Good symmetric motor tone is noted throughout.  Sensory: Sensory testing is intact to soft touch on all 4 extremities. No evidence of  extinction is noted.  Coordination: Cerebellar testing reveals good finger-nose-finger and heel-to-shin bilaterally.  Gait and station: Gait is normal. Tandem gait is slightly unsteady. Romberg is negative. No drift is seen.  Reflexes: Deep tendon reflexes are symmetric and normal bilaterally.   DIAGNOSTIC DATA (LABS, IMAGING, TESTING) - I reviewed patient records, labs, notes, testing and imaging myself where available.  Lab Results  Component Value Date   WBC 5.3 08/06/2016   HGB 12.9 08/06/2016  HCT 39.1 08/06/2016   MCV 83.7 08/06/2016   PLT 166 08/06/2016      Component Value Date/Time   NA 141 08/06/2016 1207   K 3.7 08/06/2016 1207   CL 110 08/06/2016 1207   CO2 23 08/06/2016 1207   GLUCOSE 90 08/06/2016 1207   BUN 11 08/06/2016 1207   CREATININE 0.74 08/06/2016 1207   CREATININE 0.60 12/22/2013 1045   CALCIUM 9.2 08/06/2016 1207   PROT 6.3 (L) 08/06/2016 1207   ALBUMIN 4.1 08/06/2016 1207   AST 20 08/06/2016 1207   ALT 21 08/06/2016 1207   ALKPHOS 71 08/06/2016 1207   BILITOT 1.0 08/06/2016 1207   GFRNONAA >60 08/06/2016 1207   GFRAA >60 08/06/2016 1207   Lab Results  Component Value Date   CHOL 168 01/02/2015   HDL 53.70 01/02/2015   LDLCALC 96 01/02/2015   TRIG 91.0 01/02/2015   CHOLHDL 3 01/02/2015   No results found for: HGBA1C No results found for: VITAMINB12 Lab Results  Component Value Date   TSH 1.04 01/02/2015      ASSESSMENT AND PLAN 64 y.o. year old female  has a past medical history of Anxiety, Bradycardia, Common migraine with intractable migraine (07/03/2016), Depression, Dizziness, Headache, Menopause, and Syncope. here with:  1.  Migraine headache  For the last several weeks, she has had an increase in headaches, 5-10 a month.  She wishes to remain on Topamax for now, 150 mg at bedtime, Maxalt as needed.  She is currently on day 4 of a continued headache.  I will give her a 3-day Decadron taper if needed to start tomorrow.  We  discussed other options for managing her headaches, she may be a good candidate for CGRP.  For now, she does not want to change medications, will start keeping a headache journal.  She is already taking Abilify, Lexapro, and BuSpar.  She is not a good candidate for propanolol, heart rate was 53, BP 91/53.  She will send me a MyChart message how she is doing, otherwise I will see her in 6 months or sooner if needed.  I spent 15 minutes with the patient. 50% of this time was spent discussing her plan of care.  Butler Denmark, AGNP-C, DNP 07/21/2019, 2:49 PM [facility] [address] [place], [place] [address] [phone]

## 2019-07-22 ENCOUNTER — Ambulatory Visit: Payer: BLUE CROSS/BLUE SHIELD | Attending: Internal Medicine

## 2019-07-22 DIAGNOSIS — Z23 Encounter for immunization: Secondary | ICD-10-CM

## 2019-07-22 NOTE — Progress Notes (Signed)
I have read the note, and I agree with the clinical assessment and plan.  Fraya Ueda K Lemuel Boodram   

## 2019-07-22 NOTE — Progress Notes (Signed)
   Covid-19 Vaccination Clinic  Name:  Sharon Cole    MRN: [id number] DOB: 07-Mar-1956  07/22/2019  Ms. Guthrie was observed post Covid-19 immunization for 15 minutes without incident. She was provided with Vaccine Information Sheet and instruction to access the V-Safe system.   Ms. Jimmy was instructed to call 911 with any severe reactions post vaccine: Marland Kitchen Difficulty breathing  . Swelling of face and throat  . A fast heartbeat  . A bad rash all over body  . Dizziness and weakness   Immunizations Administered    Name Date Dose VIS Date Route   Pfizer COVID-19 Vaccine 07/22/2019 12:36 PM 0.3 mL 04/16/2019 Intramuscular   Manufacturer: [place]   Lot: [id number]   [place]: [id number]

## 2019-08-02 ENCOUNTER — Other Ambulatory Visit: Payer: Self-pay

## 2019-08-02 ENCOUNTER — Encounter (HOSPITAL_COMMUNITY): Payer: Self-pay | Admitting: Psychiatry

## 2019-08-02 ENCOUNTER — Ambulatory Visit (INDEPENDENT_AMBULATORY_CARE_PROVIDER_SITE_OTHER): Payer: BLUE CROSS/BLUE SHIELD | Admitting: Psychiatry

## 2019-08-02 DIAGNOSIS — F3342 Major depressive disorder, recurrent, in full remission: Secondary | ICD-10-CM | POA: Diagnosis not present

## 2019-08-02 DIAGNOSIS — F411 Generalized anxiety disorder: Secondary | ICD-10-CM

## 2019-08-02 DIAGNOSIS — F331 Major depressive disorder, recurrent, moderate: Secondary | ICD-10-CM

## 2019-08-02 MED ORDER — ESCITALOPRAM OXALATE 20 MG PO TABS: 30.0000 mg | ORAL_TABLET | Freq: Every day | ORAL | 0 refills | Status: DC

## 2019-08-02 MED ORDER — BUSPIRONE HCL 15 MG PO TABS: 15.0000 mg | ORAL_TABLET | Freq: Two times a day (BID) | ORAL | 1 refills | Status: DC

## 2019-08-02 NOTE — Progress Notes (Signed)
BH MD/PA/NP OP Progress Note  08/02/2019 12:59 PM Sharon Cole  MRN:  [id number]  Chief Complaint: Medication management appointment HPI: This appointment was conducted via phone due to Covid epidemic precautions.  Patient's identity verified using 2 different identifiers.  Limitations associated with this mode of communication have been reviewed. 64 year old female, history of anxiety and depression.  Prior diagnoses of MDD and GAD.  Currently reports she has been doing well with improved psychiatric symptoms.  She states she has felt less depressed and currently describes euthymia without significant neurovegetative symptoms.  Anxiety has also improved.Currently she is functioning well in her daily activities.  Presents future oriented, for example looking forward to being able to spend time with her elderly mother as Covid epidemic improves and restrictions ease Unfortunately, states that although Abilify augmentation has been helpful in improving mood it has also caused some significant blurry vision.  Because of this prefers to discontinue this medication at this time. We reviewed treatment options.  She states that she has been on Lexapro and BuSpar for years.  She remembers having been on Lexapro at 30 mg daily which she tolerated well without any side effects and states "I think I felt better at that dose".  Of note, she is also prescribed Topamax by her PCP for headache prophylaxis and occasionally takes rizatriptan for headaches.  Denies having any side effects or drug drug interactions We discussed medication side effects.  We reviewed potential for serious side effect such as serotonin syndrome or QT prolongation.  As mentioned patient states that she was on Lexapro 30 mg daily for years without any side effects.  Visit Diagnosis: MDD by history Past Psychiatric History:   Past Medical History:  Past Medical History:  Diagnosis Date  . Anxiety   . Bradycardia   . Common migraine  with intractable migraine 07/03/2016  . Depression   . Dizziness   . Headache   . Menopause   . Syncope     Past Surgical History:  Procedure Laterality Date  . BACK SURGERY     cyst removal   . BREAST SURGERY     breast reduction  . BUNIONECTOMY    . CHOLECYSTECTOMY    . KNEE ARTHROSCOPY      Family Psychiatric History:   Family History:  Family History  Problem Relation Age of Onset  . Heart disease Mother   . Heart disease Brother   . Heart disease Maternal Grandmother   . Heart disease Maternal Grandfather   . Cancer Son        unknown    Social History:  Social History   Socioeconomic History  . Marital status: Significant Other    Spouse name: Not on file  . Number of children: 2  . Years of education: Masters  . Highest education level: Not on file  Occupational History  . Not on file  Tobacco Use  . Smoking status: Never Smoker  . Smokeless tobacco: Never Used  Substance and Sexual Activity  . Alcohol use: Yes    Alcohol/week: 1.0 standard drinks    Types: 1 Glasses of wine per week    Comment: daily  . Drug use: No  . Sexual activity: Yes    Partners: Male  Other Topics Concern  . Not on file  Social History Narrative   Lives   Caffeine use:    Drinks 16oz caffeine drinks a day    Social Determinants of Radio broadcast assistant Strain:   .  Difficulty of Paying Living Expenses:   Food Insecurity:   . Worried About Charity fundraiser in the Last Year:   . Arboriculturist in the Last Year:   Transportation Needs:   . Film/video editor (Medical):   Marland Kitchen Lack of Transportation (Non-Medical):   Physical Activity:   . Days of Exercise per Week:   . Minutes of Exercise per Session:   Stress:   . Feeling of Stress :   Social Connections:   . Frequency of Communication with Friends and Family:   . Frequency of Social Gatherings with Friends and Family:   . Attends Religious Services:   . Active Member of Clubs or Organizations:   .  Attends Archivist Meetings:   Marland Kitchen Marital Status:     Allergies: No Known Allergies  Metabolic Disorder Labs: No results found for: HGBA1C, MPG No results found for: PROLACTIN Lab Results  Component Value Date   CHOL 168 01/02/2015   TRIG 91.0 01/02/2015   HDL 53.70 01/02/2015   CHOLHDL 3 01/02/2015   VLDL 18.2 01/02/2015   LDLCALC 96 01/02/2015   LDLCALC 98 08/10/2013   Lab Results  Component Value Date   TSH 1.04 01/02/2015   TSH 2.986 08/10/2013    Therapeutic Level Labs: No results found for: LITHIUM No results found for: VALPROATE No components found for:  CBMZ  Current Medications: Current Outpatient Medications  Medication Sig Dispense Refill  . ARIPiprazole (ABILIFY) 2 MG tablet Take 1 tablet (2 mg total) by mouth daily. 30 tablet 1  . busPIRone (BUSPAR) 30 MG tablet Take 1 tablet (30 mg total) by mouth 2 (two) times daily. 60 tablet 1  . calcium-vitamin D (OSCAL WITH D) 250-125 MG-UNIT tablet Take 1 tablet by mouth daily.    Marland Kitchen dexamethasone (DECADRON) 2 MG tablet Take 3, then take 2, then take 1 6 tablet 0  . escitalopram (LEXAPRO) 20 MG tablet Take 1 tablet (20 mg total) by mouth daily. 30 tablet 1  . MAGNESIUM PO Take 1 tablet by mouth daily.    . Multiple Vitamins-Minerals (MULTIVITAMIN PO) Take 1 tablet by mouth daily.    Marland Kitchen omeprazole (PRILOSEC) 10 MG capsule Take 10 mg by mouth daily.    . rizatriptan (MAXALT) 10 MG tablet TAKE 1 TABLET BY MOUTH 3 TIMES A DAY AS NEEDED FOR MIGRAINE 10 tablet 5  . topiramate (TOPAMAX) 50 MG tablet Take 3 tablets (150 mg total) by mouth at bedtime. 270 tablet 3   No current facility-administered medications for this visit.       Psychiatric Specialty Exam: Please take into account limitations in obtaining a full mental status exam in the context of this mode of communication Review of Systems does not endorse medication side effects  There were no vitals taken for this visit.There is no height or weight on file  to calculate BMI.  General Appearance: NA  Eye Contact:  NA  Speech:  Normal Rate  Volume:  Normal  Mood:  Reports feeling better and currently presents euthymic  Affect:  Appropriate and Full Range  Thought Process:  Linear and Descriptions of Associations: Intact  Orientation:  Full (Time, Place, and Person)  Thought Content: No hallucinations, no delusions   Suicidal Thoughts:  No denies suicidal or self-injurious ideations  Homicidal Thoughts:  No  Memory:  Recent and remote grossly intact  Judgement:  Other:  Present  Insight:  Present  Psychomotor Activity:  NA, but denies feeling agitated  or restless  Concentration:  Concentration: Good and Attention Span: Good  Recall:  Good  Fund of Knowledge: Good  Language: Good  Akathisia:  Negative  Handed:  Right  AIMS (if indicated):   Assets:  Communication Skills Desire for Improvement Resilience  ADL's:  Intact  Cognition: WNL  Sleep:  Good   Screenings:   Assessment and Plan:  64 year old female with a history of depression/anxiety.  Currently doing well on combination of Lexapro, BuSpar, low-dose Abilify as augmentation strategy.  She reports improvement in mood and currently denies significant neurovegetative symptoms/presents euthymic.  Unfortunately, reports blurry vision which she attributes to Abilify as she first noticed that coinciding with this medication trial.  As such she prefers to discontinue Abilify and increase Lexapro dose.  She reports she has been on Lexapro up to 30 mg daily in the past without any side effects.  Side effects have been reviewed, to include potential risk of GI side effects and also risk of serotonin syndrome or QTc prolongation.  Symptoms of a serotonin syndrome where reviewed with patient We agreed on the following Discontinue Abilify due to above side effect. Increase Lexapro to 30 mg daily. Decrease BuSpar to 15 mg twice daily. Have reviewed obtaining a TSH and a routine EKG, which she  states she will have done via her PCP. We will see in 1 month, patient agrees to contact clinic sooner should to be any worsening or concern prior.    Jenne Campus, MD 08/02/2019, 12:59 PM

## 2019-08-03 ENCOUNTER — Ambulatory Visit (INDEPENDENT_AMBULATORY_CARE_PROVIDER_SITE_OTHER): Payer: BLUE CROSS/BLUE SHIELD | Admitting: Psychology

## 2019-08-03 DIAGNOSIS — F332 Major depressive disorder, recurrent severe without psychotic features: Secondary | ICD-10-CM | POA: Diagnosis not present

## 2019-08-16 ENCOUNTER — Ambulatory Visit: Payer: BLUE CROSS/BLUE SHIELD | Attending: Internal Medicine

## 2019-08-16 DIAGNOSIS — Z23 Encounter for immunization: Secondary | ICD-10-CM

## 2019-08-16 NOTE — Progress Notes (Signed)
   Covid-19 Vaccination Clinic  Name:  Sharon Cole    MRN: [id number] DOB: 1956/03/14  08/16/2019  [name] was observed post Covid-19 immunization for 15 minutes without incident. She was provided with Vaccine Information Sheet and instruction to access the V-Safe system.   [name] was instructed to call 911 with any severe reactions post vaccine: Marland Kitchen Difficulty breathing  . Swelling of face and throat  . A fast heartbeat  . A bad rash all over body  . Dizziness and weakness   Immunizations Administered    Name Date Dose VIS Date Route   Pfizer COVID-19 Vaccine 08/16/2019 12:04 PM 0.3 mL 04/16/2019 Intramuscular   Manufacturer: [place]   Lot: [id number]   [place]: [id number]

## 2019-08-17 ENCOUNTER — Ambulatory Visit (INDEPENDENT_AMBULATORY_CARE_PROVIDER_SITE_OTHER): Payer: BLUE CROSS/BLUE SHIELD | Admitting: Psychology

## 2019-08-17 DIAGNOSIS — F332 Major depressive disorder, recurrent severe without psychotic features: Secondary | ICD-10-CM

## 2019-08-31 ENCOUNTER — Ambulatory Visit: Payer: Self-pay | Admitting: Psychology

## 2019-08-31 ENCOUNTER — Other Ambulatory Visit (HOSPITAL_COMMUNITY): Payer: Self-pay | Admitting: Psychiatry

## 2019-08-31 DIAGNOSIS — F411 Generalized anxiety disorder: Secondary | ICD-10-CM

## 2019-08-31 DIAGNOSIS — F331 Major depressive disorder, recurrent, moderate: Secondary | ICD-10-CM

## 2019-09-02 ENCOUNTER — Ambulatory Visit (HOSPITAL_COMMUNITY): Payer: BLUE CROSS/BLUE SHIELD | Admitting: Psychiatry

## 2019-09-14 ENCOUNTER — Ambulatory Visit (INDEPENDENT_AMBULATORY_CARE_PROVIDER_SITE_OTHER): Payer: BLUE CROSS/BLUE SHIELD | Admitting: Psychology

## 2019-09-14 DIAGNOSIS — F332 Major depressive disorder, recurrent severe without psychotic features: Secondary | ICD-10-CM | POA: Diagnosis not present

## 2019-09-22 ENCOUNTER — Other Ambulatory Visit (HOSPITAL_COMMUNITY): Payer: Self-pay | Admitting: Psychiatry

## 2019-09-22 DIAGNOSIS — F411 Generalized anxiety disorder: Secondary | ICD-10-CM

## 2019-09-22 DIAGNOSIS — F331 Major depressive disorder, recurrent, moderate: Secondary | ICD-10-CM

## 2019-09-28 ENCOUNTER — Ambulatory Visit (INDEPENDENT_AMBULATORY_CARE_PROVIDER_SITE_OTHER): Payer: BLUE CROSS/BLUE SHIELD | Admitting: Psychology

## 2019-09-28 DIAGNOSIS — F332 Major depressive disorder, recurrent severe without psychotic features: Secondary | ICD-10-CM | POA: Diagnosis not present

## 2019-09-30 ENCOUNTER — Other Ambulatory Visit (HOSPITAL_COMMUNITY): Payer: Self-pay | Admitting: *Deleted

## 2019-09-30 DIAGNOSIS — F411 Generalized anxiety disorder: Secondary | ICD-10-CM

## 2019-09-30 DIAGNOSIS — F331 Major depressive disorder, recurrent, moderate: Secondary | ICD-10-CM

## 2019-09-30 MED ORDER — ESCITALOPRAM OXALATE 20 MG PO TABS: 30.0000 mg | ORAL_TABLET | Freq: Every day | ORAL | 0 refills | Status: DC

## 2019-09-30 MED ORDER — BUSPIRONE HCL 15 MG PO TABS: 15.0000 mg | ORAL_TABLET | Freq: Two times a day (BID) | ORAL | 1 refills | Status: DC

## 2019-10-11 ENCOUNTER — Other Ambulatory Visit: Payer: Self-pay

## 2019-10-11 ENCOUNTER — Telehealth (INDEPENDENT_AMBULATORY_CARE_PROVIDER_SITE_OTHER): Payer: BLUE CROSS/BLUE SHIELD | Admitting: Psychiatry

## 2019-10-11 DIAGNOSIS — F411 Generalized anxiety disorder: Secondary | ICD-10-CM

## 2019-10-11 DIAGNOSIS — F331 Major depressive disorder, recurrent, moderate: Secondary | ICD-10-CM | POA: Diagnosis not present

## 2019-10-11 MED ORDER — ARIPIPRAZOLE (SENSOR) 2 MG PO TABS: 2.0000 mg | ORAL_TABLET | Freq: Every day | ORAL | 1 refills | Status: DC

## 2019-10-11 MED ORDER — ESCITALOPRAM OXALATE 20 MG PO TABS: 20.0000 mg | ORAL_TABLET | Freq: Every day | ORAL | 1 refills | Status: DC

## 2019-10-11 MED ORDER — BUSPIRONE HCL 15 MG PO TABS: 15.0000 mg | ORAL_TABLET | Freq: Two times a day (BID) | ORAL | 1 refills | Status: DC

## 2019-10-11 NOTE — Progress Notes (Signed)
BH MD/PA/NP OP Progress Note  10/11/2019 11:51 AM Sharon Cole  MRN:  [id number]  Chief Complaint: Medication management appointment HPI: This appointment was conducted via phone related to Covid epidemic precautions.  Patient's identity verified using 2 different identifiers.  Limitations associated with this type of communication have been reviewed.  64 year old female, lives with fianc, history of anxiety/depression.  Has been diagnosed with GAD and MDD in the past .   On last visit patient had reported that Abilify augmentation had been helpful clinically but appeared to be associated with blurry vision.  Related to this Abilify was discontinued at that time.  She reports that she realized blurry vision was not related to Abilify or other medication but rather seem to be related to visual acuity and optometry issues which she has addressed. States that because of this she has decided to continue Abilify and is currently tolerating well ( she did recently run out).  She has also continued to take Lexapro at 20 mg daily rather than 30 mg daily and BuSpar 15 mg twice daily.  She currently denies medication side effects. She reports that in general she has been doing well and is functioning well in her daily activities.  Severity of anxiety symptoms have improved with medication.  She does endorse some ongoing anxiety mainly in relation to chronic family stressors pertaining to her adult son who has chronic mental illness and behavioral difficulties. She denies suicidal ideations or significant neurovegetative symptoms at this time.   Visit Diagnosis: MDD by history Past Psychiatric History:   Past Medical History:  Past Medical History:  Diagnosis Date  . Anxiety   . Bradycardia   . Common migraine with intractable migraine 07/03/2016  . Depression   . Dizziness   . Headache   . Menopause   . Syncope     Past Surgical History:  Procedure Laterality Date  . BACK SURGERY     cyst  removal   . BREAST SURGERY     breast reduction  . BUNIONECTOMY    . CHOLECYSTECTOMY    . KNEE ARTHROSCOPY      Family Psychiatric History:   Family History:  Family History  Problem Relation Age of Onset  . Heart disease Mother   . Heart disease Brother   . Heart disease Maternal Grandmother   . Heart disease Maternal Grandfather   . Cancer Son        unknown    Social History:  Social History   Socioeconomic History  . Marital status: Significant Other    Spouse name: Not on file  . Number of children: 2  . Years of education: Masters  . Highest education level: Not on file  Occupational History  . Not on file  Tobacco Use  . Smoking status: Never Smoker  . Smokeless tobacco: Never Used  Substance and Sexual Activity  . Alcohol use: Yes    Alcohol/week: 1.0 standard drinks    Types: 1 Glasses of wine per week    Comment: daily  . Drug use: No  . Sexual activity: Yes    Partners: Male  Other Topics Concern  . Not on file  Social History Narrative   Lives   Caffeine use:    Drinks 16oz caffeine drinks a day    Social Determinants of Radio broadcast assistant Strain:   . Difficulty of Paying Living Expenses:   Food Insecurity:   . Worried About Charity fundraiser in the Last  Year:   . Ran Out of Food in the Last Year:   Transportation Needs:   . Film/video editor (Medical):   Marland Kitchen Lack of Transportation (Non-Medical):   Physical Activity:   . Days of Exercise per Week:   . Minutes of Exercise per Session:   Stress:   . Feeling of Stress :   Social Connections:   . Frequency of Communication with Friends and Family:   . Frequency of Social Gatherings with Friends and Family:   . Attends Religious Services:   . Active Member of Clubs or Organizations:   . Attends Archivist Meetings:   Marland Kitchen Marital Status:     Allergies: No Known Allergies  Metabolic Disorder Labs: No results found for: HGBA1C, MPG No results found for:  PROLACTIN Lab Results  Component Value Date   CHOL 168 01/02/2015   TRIG 91.0 01/02/2015   HDL 53.70 01/02/2015   CHOLHDL 3 01/02/2015   VLDL 18.2 01/02/2015   LDLCALC 96 01/02/2015   LDLCALC 98 08/10/2013   Lab Results  Component Value Date   TSH 1.04 01/02/2015   TSH 2.986 08/10/2013    Therapeutic Level Labs: No results found for: LITHIUM No results found for: VALPROATE No components found for:  CBMZ  Current Medications: Current Outpatient Medications  Medication Sig Dispense Refill  . ARIPiprazole 2 MG TABS Take 2 mg by mouth daily. 30 tablet 1  . busPIRone (BUSPAR) 15 MG tablet Take 1 tablet (15 mg total) by mouth 2 (two) times daily. 30 tablet 1  . calcium-vitamin D (OSCAL WITH D) 250-125 MG-UNIT tablet Take 1 tablet by mouth daily.    Marland Kitchen escitalopram (LEXAPRO) 20 MG tablet Take 1 tablet (20 mg total) by mouth daily. 30 tablet 1  . MAGNESIUM PO Take 1 tablet by mouth daily.    . Multiple Vitamins-Minerals (MULTIVITAMIN PO) Take 1 tablet by mouth daily.    Marland Kitchen omeprazole (PRILOSEC) 10 MG capsule Take 10 mg by mouth daily.    . rizatriptan (MAXALT) 10 MG tablet TAKE 1 TABLET BY MOUTH 3 TIMES A DAY AS NEEDED FOR MIGRAINE 10 tablet 5  . topiramate (TOPAMAX) 50 MG tablet Take 3 tablets (150 mg total) by mouth at bedtime. 270 tablet 3   No current facility-administered medications for this visit.       Psychiatric Specialty Exam: Please take into account limitations in obtaining a full mental status exam in the context of this mode of communication Review of Systems does not endorse medication side effects-as above reports blurry vision was not related to medication side effects.  There were no vitals taken for this visit.There is no height or weight on file to calculate BMI.  General Appearance: NA  Eye Contact:  NA  Speech:  Normal Rate  Volume:  Normal  Mood:  Improved and appears euthymic  Affect:  Full Range  Thought Process:  Linear and Descriptions of  Associations: Intact  Orientation:  Full (Time, Place, and Person)  Thought Content: No hallucinations, no delusions   Suicidal Thoughts:  No denies suicidal or self-injurious ideations  Homicidal Thoughts:  No  Memory:  Recent and remote grossly intact  Judgement:  Other:  Present  Insight:  Present  Psychomotor Activity:  NA  Concentration:  Concentration: Good and Attention Span: Good  Recall:  Good  Fund of Knowledge: Good  Language: Good  Akathisia:  Negative  Handed:  Right  AIMS (if indicated):   Assets:  Communication Skills Desire for  Improvement Resilience  ADL's:  Intact  Cognition: WNL  Sleep:  Good   Screenings:   Assessment and Plan:  64 year old female with a history of depression/anxiety.    Patient reports she has continued previous medication regimen (Lexapro 20 mg daily, BuSpar 15 mg twice daily, Abilify 2 mg daily) and that this regimen is currently well-tolerated and helpful.  She had reported concern that Abilify was causing blurred vision on last visit but states that she has realized that this symptom had to do with her vision and has addressed through corrective glasses/optometry.  She states she is currently tolerating these medications well.  She is generally doing well and functioning well in daily activities.  Son's history of chronic mental illness/level of functioning is described as an ongoing stressor. Plan continue Lexapro 20 mg daily, BuSpar 15 mg twice daily and Abilify 2 mg daily. We will see in approximately 8 weeks.  Patient agrees to contact clinic sooner should there be any worsening or concern prior.    Jenne Campus, MD 10/11/2019, 11:51 AM

## 2019-10-12 ENCOUNTER — Ambulatory Visit (INDEPENDENT_AMBULATORY_CARE_PROVIDER_SITE_OTHER): Payer: BLUE CROSS/BLUE SHIELD | Admitting: Psychology

## 2019-10-12 DIAGNOSIS — F332 Major depressive disorder, recurrent severe without psychotic features: Secondary | ICD-10-CM | POA: Diagnosis not present

## 2019-10-21 ENCOUNTER — Other Ambulatory Visit (HOSPITAL_COMMUNITY): Payer: Self-pay | Admitting: Psychiatry

## 2019-10-21 DIAGNOSIS — F331 Major depressive disorder, recurrent, moderate: Secondary | ICD-10-CM

## 2019-10-21 DIAGNOSIS — F411 Generalized anxiety disorder: Secondary | ICD-10-CM

## 2019-10-26 ENCOUNTER — Ambulatory Visit: Payer: Self-pay | Admitting: Psychology

## 2019-11-09 ENCOUNTER — Ambulatory Visit: Payer: Self-pay | Admitting: Psychology

## 2019-11-10 ENCOUNTER — Other Ambulatory Visit (HOSPITAL_COMMUNITY): Payer: Self-pay | Admitting: *Deleted

## 2019-11-10 DIAGNOSIS — F411 Generalized anxiety disorder: Secondary | ICD-10-CM

## 2019-11-10 DIAGNOSIS — F331 Major depressive disorder, recurrent, moderate: Secondary | ICD-10-CM

## 2019-11-10 MED ORDER — BUSPIRONE HCL 15 MG PO TABS: 15.0000 mg | ORAL_TABLET | Freq: Two times a day (BID) | ORAL | 1 refills | Status: DC

## 2019-11-11 ENCOUNTER — Ambulatory Visit (INDEPENDENT_AMBULATORY_CARE_PROVIDER_SITE_OTHER): Payer: BLUE CROSS/BLUE SHIELD | Admitting: Psychology

## 2019-11-11 DIAGNOSIS — F332 Major depressive disorder, recurrent severe without psychotic features: Secondary | ICD-10-CM

## 2019-11-23 ENCOUNTER — Ambulatory Visit: Payer: Self-pay | Admitting: Psychology

## 2019-11-26 ENCOUNTER — Ambulatory Visit: Payer: BLUE CROSS/BLUE SHIELD | Admitting: Psychology

## 2019-12-02 ENCOUNTER — Telehealth (HOSPITAL_COMMUNITY): Payer: Self-pay

## 2019-12-02 NOTE — Telephone Encounter (Signed)
Spoke with patient regarding her Buspirone 15mg . She stated that with the 15mg  BID she still has very high anxiety and that she would like to have 30mg  BID sent in due to that working perfectly. She also stated that her mom is dying and she really needs the 60mg  total. She uses [facility] on [address] in [place]. Follow up appt scheduled for 12/14/19. Thank you.

## 2019-12-03 ENCOUNTER — Other Ambulatory Visit (HOSPITAL_COMMUNITY): Payer: Self-pay | Admitting: *Deleted

## 2019-12-03 ENCOUNTER — Other Ambulatory Visit (HOSPITAL_COMMUNITY): Payer: Self-pay | Admitting: Psychiatry

## 2019-12-03 DIAGNOSIS — F411 Generalized anxiety disorder: Secondary | ICD-10-CM

## 2019-12-03 DIAGNOSIS — F331 Major depressive disorder, recurrent, moderate: Secondary | ICD-10-CM

## 2019-12-03 MED ORDER — BUSPIRONE HCL 15 MG PO TABS: 15.0000 mg | ORAL_TABLET | Freq: Three times a day (TID) | ORAL | 0 refills | Status: DC

## 2019-12-03 MED ORDER — BUSPIRONE HCL 15 MG PO TABS: 15.0000 mg | ORAL_TABLET | Freq: Two times a day (BID) | ORAL | 0 refills | Status: DC

## 2019-12-07 ENCOUNTER — Ambulatory Visit: Payer: Self-pay | Admitting: Psychology

## 2019-12-14 ENCOUNTER — Telehealth (INDEPENDENT_AMBULATORY_CARE_PROVIDER_SITE_OTHER): Payer: BLUE CROSS/BLUE SHIELD | Admitting: Psychiatry

## 2019-12-14 ENCOUNTER — Other Ambulatory Visit: Payer: Self-pay

## 2019-12-14 DIAGNOSIS — F411 Generalized anxiety disorder: Secondary | ICD-10-CM | POA: Diagnosis not present

## 2019-12-14 DIAGNOSIS — F331 Major depressive disorder, recurrent, moderate: Secondary | ICD-10-CM

## 2019-12-14 MED ORDER — ESCITALOPRAM OXALATE 20 MG PO TABS: 20.0000 mg | ORAL_TABLET | Freq: Every day | ORAL | 1 refills | Status: DC

## 2019-12-14 MED ORDER — BUSPIRONE HCL 30 MG PO TABS: 30.0000 mg | ORAL_TABLET | Freq: Two times a day (BID) | ORAL | 1 refills | Status: DC

## 2019-12-14 MED ORDER — ARIPIPRAZOLE (SENSOR) 2 MG PO TABS: 2.0000 mg | ORAL_TABLET | Freq: Every day | ORAL | 1 refills | Status: DC

## 2019-12-14 NOTE — Progress Notes (Signed)
BH MD/PA/NP OP Progress Note  12/14/2019 11:19 AM Sharon Cole  MRN:  [id number]  Chief Complaint: Medication management appointment HPI: This appointment was conducted via phone related to Covid epidemic precautions.  Patient's identity verified.   Limitations associated with this type of communication have been reviewed.   64 year old female, lives with fianc, history of anxiety/depression.  Has been diagnosed with GAD and MDD in the past .   She reports her mother passed away last week. She describes sadness and grief related to this loss and explains she was very close to her mother. She is grateful that her mother, who had an intestinal obstruction and chronic cardiac illness, was able to pass away peacefully, at home, and in the company of her children. She also states that her fiance is very supportive and understanding of her. Denies significant neuro-vegetative symptoms and reports good appetite and "OK" sleep/energy level . She denies having any suicidal ideations. She states she is doing well in her every day activities . Currently tolerating medications well , denies side effects. We have reviewed medications/doses. Patient clarifies that she has been taking Buspar at 30 mgrs BID . States that this has been her established dose and feels she does better at this dose . Denies side effects or drug drug interactions. She continues to take Abilify/Lexapro. Denies side effects. We reviewed side effect profile to include potential risk of serotonin syndrome ( * she also reports taking Maxalt PRN for migraines, although irregularly). She denies any symptoms suggestive of Serotonin Syndrome and has been tolerating her current medication regimen well .     Visit Diagnosis: MDD by history Past Psychiatric History:   Past Medical History:  Past Medical History:  Diagnosis Date  . Anxiety   . Bradycardia   . Common migraine with intractable migraine 07/03/2016  . Depression   .  Dizziness   . Headache   . Menopause   . Syncope     Past Surgical History:  Procedure Laterality Date  . BACK SURGERY     cyst removal   . BREAST SURGERY     breast reduction  . BUNIONECTOMY    . CHOLECYSTECTOMY    . KNEE ARTHROSCOPY      Family Psychiatric History:   Family History:  Family History  Problem Relation Age of Onset  . Heart disease Mother   . Heart disease Brother   . Heart disease Maternal Grandmother   . Heart disease Maternal Grandfather   . Cancer Son        unknown    Social History:  Social History   Socioeconomic History  . Marital status: Significant Other    Spouse name: Not on file  . Number of children: 2  . Years of education: Masters  . Highest education level: Not on file  Occupational History  . Not on file  Tobacco Use  . Smoking status: Never Smoker  . Smokeless tobacco: Never Used  Vaping Use  . Vaping Use: Never used  Substance and Sexual Activity  . Alcohol use: Yes    Alcohol/week: 1.0 standard drink    Types: 1 Glasses of wine per week    Comment: daily  . Drug use: No  . Sexual activity: Yes    Partners: Male  Other Topics Concern  . Not on file  Social History Narrative   Lives   Caffeine use:    Drinks 16oz caffeine drinks a day    Social Determinants of Health  Financial Resource Strain:   . Difficulty of Paying Living Expenses:   Food Insecurity:   . Worried About Charity fundraiser in the Last Year:   . Arboriculturist in the Last Year:   Transportation Needs:   . Film/video editor (Medical):   Marland Kitchen Lack of Transportation (Non-Medical):   Physical Activity:   . Days of Exercise per Week:   . Minutes of Exercise per Session:   Stress:   . Feeling of Stress :   Social Connections:   . Frequency of Communication with Friends and Family:   . Frequency of Social Gatherings with Friends and Family:   . Attends Religious Services:   . Active Member of Clubs or Organizations:   . Attends Theatre manager Meetings:   Marland Kitchen Marital Status:     Allergies: No Known Allergies  Metabolic Disorder Labs: No results found for: HGBA1C, MPG No results found for: PROLACTIN Lab Results  Component Value Date   CHOL 168 01/02/2015   TRIG 91.0 01/02/2015   HDL 53.70 01/02/2015   CHOLHDL 3 01/02/2015   VLDL 18.2 01/02/2015   LDLCALC 96 01/02/2015   LDLCALC 98 08/10/2013   Lab Results  Component Value Date   TSH 1.04 01/02/2015   TSH 2.986 08/10/2013    Therapeutic Level Labs: No results found for: LITHIUM No results found for: VALPROATE No components found for:  CBMZ  Current Medications: Current Outpatient Medications  Medication Sig Dispense Refill  . ARIPiprazole 2 MG TABS Take 2 mg by mouth daily. 30 tablet 1  . busPIRone (BUSPAR) 15 MG tablet Take 1 tablet (15 mg total) by mouth 2 (two) times daily. 90 tablet 0  . calcium-vitamin D (OSCAL WITH D) 250-125 MG-UNIT tablet Take 1 tablet by mouth daily.    Marland Kitchen escitalopram (LEXAPRO) 20 MG tablet Take 1 tablet (20 mg total) by mouth daily. 30 tablet 1  . MAGNESIUM PO Take 1 tablet by mouth daily.    . Multiple Vitamins-Minerals (MULTIVITAMIN PO) Take 1 tablet by mouth daily.    Marland Kitchen omeprazole (PRILOSEC) 10 MG capsule Take 10 mg by mouth daily.    . rizatriptan (MAXALT) 10 MG tablet TAKE 1 TABLET BY MOUTH 3 TIMES A DAY AS NEEDED FOR MIGRAINE 10 tablet 5  . topiramate (TOPAMAX) 50 MG tablet Take 3 tablets (150 mg total) by mouth at bedtime. 270 tablet 3   No current facility-administered medications for this visit.       Psychiatric Specialty Exam: Please take into account limitations in obtaining a full mental status exam in the context of this mode of communication Review of Systems does not endorse medication side effects- at this time  There were no vitals taken for this visit.There is no height or weight on file to calculate BMI.  General Appearance: NA  Eye Contact:  NA  Speech:  Normal Rate  Volume:  Normal  Mood:   reports sadness related to mother's death recently   Affect:  presents appropriate// reactive   Thought Process:  Linear and Descriptions of Associations: Intact  Orientation:  Full (Time, Place, and Person)  Thought Content: No hallucinations, no delusions   Suicidal Thoughts:  No denies suicidal or self-injurious ideations  Homicidal Thoughts:  No  Memory:  Recent and remote grossly intact  Judgement:  Other:  Present  Insight:  Present  Psychomotor Activity:  NA  Concentration:  Concentration: Good and Attention Span: Good  Recall:  Good  Fund of  Knowledge: Good  Language: Good  Akathisia:  Negative  Handed:  Right  AIMS (if indicated):   Assets:  Communication Skills Desire for Improvement Resilience  ADL's:  Intact  Cognition: WNL  Sleep:  Good   Screenings:   Assessment and Plan:  64 year old female with a history of depression/anxiety.    Sharon Cole mother passed away last week, and she reports she has been feeling sad and grieving her death, but denies worsening depression or current neuro-vegetative symptoms of depression. Denies suicidal ideations and states she is doing well in her everyday activities. She denies medication side effects and clarifies that she has been taking Buspar at 30 mgrs BID- feels this dose has been more effective and denies side effects or drug drug interactions. Overall , states she feels comfortable and stable on current medication regimen. Continue Buspar 30 mgrs BID, Lexapro 20 mgrs QDAY, Abilify 2 mgrs QDAY. Next appointment in 4-6 weeks. Agrees to contact clinic sooner if any worsening or concern prior .     Jenne Campus, MD 12/14/2019, 11:18 AM

## 2019-12-21 ENCOUNTER — Ambulatory Visit: Payer: Self-pay | Admitting: Psychology

## 2019-12-21 ENCOUNTER — Ambulatory Visit (INDEPENDENT_AMBULATORY_CARE_PROVIDER_SITE_OTHER): Payer: BLUE CROSS/BLUE SHIELD | Admitting: Psychology

## 2019-12-21 DIAGNOSIS — F332 Major depressive disorder, recurrent severe without psychotic features: Secondary | ICD-10-CM | POA: Diagnosis not present

## 2019-12-23 NOTE — Progress Notes (Signed)
Addendum   Location of parties   Patient- Home ( phone based communication) Physician (  Dr. Parke Poisson) - [facility]

## 2019-12-28 ENCOUNTER — Telehealth (HOSPITAL_COMMUNITY): Payer: Self-pay

## 2019-12-28 NOTE — Telephone Encounter (Signed)
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 She needs to check with pharmacy.

## 2019-12-28 NOTE — Telephone Encounter (Signed)
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 Spoke with patient regarding her Buspirone 15mg . She stated that with the 15mg  BID she still has very high anxiety and that she would like to have 30mg  BID sent in due to that working perfectly. She also stated that her mom is dying and she really needs the 60mg  total. She uses [facility] [address] in [place]. Follow up appt scheduled for 12/14/19. Thank you.

## 2019-12-29 NOTE — Telephone Encounter (Signed)
Followed up with patient

## 2020-01-04 ENCOUNTER — Ambulatory Visit (INDEPENDENT_AMBULATORY_CARE_PROVIDER_SITE_OTHER): Payer: BLUE CROSS/BLUE SHIELD | Admitting: Psychology

## 2020-01-04 DIAGNOSIS — F332 Major depressive disorder, recurrent severe without psychotic features: Secondary | ICD-10-CM | POA: Diagnosis not present

## 2020-01-11 ENCOUNTER — Other Ambulatory Visit: Payer: Self-pay

## 2020-01-11 ENCOUNTER — Telehealth (HOSPITAL_COMMUNITY): Payer: BLUE CROSS/BLUE SHIELD | Admitting: Psychiatry

## 2020-01-11 ENCOUNTER — Telehealth (INDEPENDENT_AMBULATORY_CARE_PROVIDER_SITE_OTHER): Payer: BLUE CROSS/BLUE SHIELD | Admitting: Psychiatry

## 2020-01-11 DIAGNOSIS — F411 Generalized anxiety disorder: Secondary | ICD-10-CM

## 2020-01-11 DIAGNOSIS — F331 Major depressive disorder, recurrent, moderate: Secondary | ICD-10-CM

## 2020-01-11 MED ORDER — ARIPIPRAZOLE (SENSOR) 2 MG PO TABS: 2.0000 mg | ORAL_TABLET | Freq: Every day | ORAL | 1 refills | Status: DC

## 2020-01-11 MED ORDER — BUSPIRONE HCL 30 MG PO TABS: 30.0000 mg | ORAL_TABLET | Freq: Two times a day (BID) | ORAL | 1 refills | Status: DC

## 2020-01-11 MED ORDER — ESCITALOPRAM OXALATE 20 MG PO TABS: 20.0000 mg | ORAL_TABLET | Freq: Every day | ORAL | 1 refills | Status: DC

## 2020-01-11 NOTE — Progress Notes (Signed)
BH MD/PA/NP OP Progress Note  01/11/2020 3:26 PM Sharon Cole  MRN:  [id number]  Chief Complaint: Medication management appointment HPI: This appointment was conducted via phone related to Covid epidemic precautions.  Patient's identity verified.   Limitations associated with this type of communication have been reviewed.   Location of parties- [facility]-Home [facility] outpatient clinic  64 year old female, lives with fianc, history of anxiety/depression.  Has been diagnosed with GAD and MDD in the past .   Today patient reports she is doing well. She states her mood is stable and denies feeling depressed.  She reports some grief/sadness related to her mother's death but denies depression associated to this loss and states that she feels her mother has been suffering/experiencing significant physical pain and deterioration of physical health toward the end of her life. She denies neurovegetative symptoms of depression, denies any suicidal ideations, does not endorse any significant neurovegetative symptoms. Currently she is future oriented, states she is looking forward to a vacation in [place] with her significant other.  States "I am really looking forward to being outdoors ".  She denies medication side effects and states she is tolerating current medication regimen well without problems.       Visit Diagnosis: MDD by history Past Psychiatric History:   Past Medical History:  Past Medical History:  Diagnosis Date  . Anxiety   . Bradycardia   . Common migraine with intractable migraine 07/03/2016  . Depression   . Dizziness   . Headache   . Menopause   . Syncope     Past Surgical History:  Procedure Laterality Date  . BACK SURGERY     cyst removal   . BREAST SURGERY     breast reduction  . BUNIONECTOMY    . CHOLECYSTECTOMY    . KNEE ARTHROSCOPY      Family Psychiatric History:   Family History:  Family History  Problem Relation Age of Onset  .  Heart disease Mother   . Heart disease Brother   . Heart disease Maternal Grandmother   . Heart disease Maternal Grandfather   . Cancer Son        unknown    Social History:  Social History   Socioeconomic History  . Marital status: Significant Other    Spouse name: Not on file  . Number of children: 2  . Years of education: Masters  . Highest education level: Not on file  Occupational History  . Not on file  Tobacco Use  . Smoking status: Never Smoker  . Smokeless tobacco: Never Used  Vaping Use  . Vaping Use: Never used  Substance and Sexual Activity  . Alcohol use: Yes    Alcohol/week: 1.0 standard drink    Types: 1 Glasses of wine per week    Comment: daily  . Drug use: No  . Sexual activity: Yes    Partners: Male  Other Topics Concern  . Not on file  Social History Narrative   Lives   Caffeine use:    Drinks 16oz caffeine drinks a day    Social Determinants of Health   Financial Resource Strain:   . Difficulty of Paying Living Expenses: Not on file  Food Insecurity:   . Worried About Charity fundraiser in the Last Year: Not on file  . Ran Out of Food in the Last Year: Not on file  Transportation Needs:   . Lack of Transportation (Medical): Not on file  . Lack of Transportation (Non-Medical):  Not on file  Physical Activity:   . Days of Exercise per Week: Not on file  . Minutes of Exercise per Session: Not on file  Stress:   . Feeling of Stress : Not on file  Social Connections:   . Frequency of Communication with Friends and Family: Not on file  . Frequency of Social Gatherings with Friends and Family: Not on file  . Attends Religious Services: Not on file  . Active Member of Clubs or Organizations: Not on file  . Attends Archivist Meetings: Not on file  . Marital Status: Not on file    Allergies: No Known Allergies  Metabolic Disorder Labs: No results found for: HGBA1C, MPG No results found for: PROLACTIN Lab Results  Component  Value Date   CHOL 168 01/02/2015   TRIG 91.0 01/02/2015   HDL 53.70 01/02/2015   CHOLHDL 3 01/02/2015   VLDL 18.2 01/02/2015   LDLCALC 96 01/02/2015   LDLCALC 98 08/10/2013   Lab Results  Component Value Date   TSH 1.04 01/02/2015   TSH 2.986 08/10/2013    Therapeutic Level Labs: No results found for: LITHIUM No results found for: VALPROATE No components found for:  CBMZ  Current Medications: Current Outpatient Medications  Medication Sig Dispense Refill  . ARIPiprazole, sensor, 2 MG TABS Take 2 mg by mouth daily. 30 tablet 1  . busPIRone (BUSPAR) 30 MG tablet Take 1 tablet (30 mg total) by mouth 2 (two) times daily. 60 tablet 1  . calcium-vitamin D (OSCAL WITH D) 250-125 MG-UNIT tablet Take 1 tablet by mouth daily.    Marland Kitchen escitalopram (LEXAPRO) 20 MG tablet Take 1 tablet (20 mg total) by mouth daily. 30 tablet 1  . MAGNESIUM PO Take 1 tablet by mouth daily.    . Multiple Vitamins-Minerals (MULTIVITAMIN PO) Take 1 tablet by mouth daily.    Marland Kitchen omeprazole (PRILOSEC) 10 MG capsule Take 10 mg by mouth daily.    . rizatriptan (MAXALT) 10 MG tablet TAKE 1 TABLET BY MOUTH 3 TIMES A DAY AS NEEDED FOR MIGRAINE 10 tablet 5  . topiramate (TOPAMAX) 50 MG tablet Take 3 tablets (150 mg total) by mouth at bedtime. 270 tablet 3   No current facility-administered medications for this visit.       Psychiatric Specialty Exam: Please take into account limitations in obtaining a full mental status exam in the context of this mode of communication Review of Systems does not endorse medication side effects- at this time  There were no vitals taken for this visit.There is no height or weight on file to calculate BMI.  General Appearance: NA  Eye Contact:  NA  Speech:  Normal Rate  Volume:  Normal  Mood:  Euthymic  Affect:  Full Range  Thought Process:  Linear and Descriptions of Associations: Intact  Orientation:  Full (Time, Place, and Person)  Thought Content: No hallucinations, no delusions    Suicidal Thoughts:  No denies suicidal or self-injurious ideations  Homicidal Thoughts:  No  Memory:  Recent and remote grossly intact  Judgement:  Other:  Present  Insight:  Present  Psychomotor Activity:  NA  Concentration:  Concentration: Good and Attention Span: Good  Recall:  Good  Fund of Knowledge: Good  Language: Good  Akathisia:  Negative  Handed:  Right  AIMS (if indicated):   Assets:  Communication Skills Desire for Improvement Resilience  ADL's:  Intact  Cognition: WNL  Sleep:  Good   Screenings:   Assessment and  Plan:  64 year old female with a history of depression/anxiety.    Currently Ms. Lagasse is doing well . She denies feeling depressed and appears euthymic at this time, future oriented and looking forward to a vacation with her SO. Remains sad about the loss of her mother, but no worsening depression or severe bereavement symptoms at this time. Denies medication side effects, which have been reviewed .  Continue Buspar 30 mgrs BID, Lexapro 20 mgrs QDAY, Abilify 2 mgrs QDAY. Next appointment in 8 weeks. Agrees to contact clinic sooner if any worsening or concern prior .     Jenne Campus, MD 01/11/2020, 3:26 PM

## 2020-01-18 ENCOUNTER — Ambulatory Visit: Payer: BLUE CROSS/BLUE SHIELD | Admitting: Psychology

## 2020-01-24 ENCOUNTER — Encounter: Payer: Self-pay | Admitting: Neurology

## 2020-01-24 ENCOUNTER — Other Ambulatory Visit: Payer: Self-pay

## 2020-01-24 ENCOUNTER — Ambulatory Visit (INDEPENDENT_AMBULATORY_CARE_PROVIDER_SITE_OTHER): Payer: BLUE CROSS/BLUE SHIELD | Admitting: Neurology

## 2020-01-24 VITALS — BP 115/71 | HR 59 | Ht 65.0 in | Wt 152.0 lb

## 2020-01-24 DIAGNOSIS — G43019 Migraine without aura, intractable, without status migrainosus: Secondary | ICD-10-CM

## 2020-01-24 MED ORDER — NURTEC 75 MG PO TBDP: 75.0000 mg | ORAL_TABLET | ORAL | 11 refills | Status: DC | PRN

## 2020-01-24 MED ORDER — TOPIRAMATE 50 MG PO TABS: 150.0000 mg | ORAL_TABLET | Freq: Every day | ORAL | 3 refills | Status: DC

## 2020-01-24 NOTE — Progress Notes (Signed)
PATIENT: Sharon Cole DOB: 25-Jan-1956  REASON FOR VISIT: follow up HISTORY FROM: patient  HISTORY OF PRESENT ILLNESS: Today 01/24/20 [name] is a 64 year old female with history of migraine headache.  She is on Topamax, Maxalt.  She has close follow-up with psychiatry, on Lexapro, BuSpar, and Abilify. Headaches are 2-3 times a month, the Maxalt has lost benefit. Health is overall stable. A friend of hers has suggested Nurtec, she is interested in trying. Here today for follow-up.  HISTORY  07/21/2019 SS: [name] is a 64 year old female with history of migraine headache.  She is on Topamax, Maxalt as needed.  The Topamax has done well for her, for the several weeks, has had an increase in headache, maybe 5-10 a month.  Her headaches are generally frontal, with photophobia, phonophobia if the headache becomes significant.  She has been using her supply of Maxalt.  She cannot identify any triggers.  She does not take frequent over-the-counter medication.  For the last 4 days, she has had a moderate headache.  She denies any new problems or concerns.  She has been placed on Abilify from her psychiatrist.  She presents today for evaluation unaccompanied.  REVIEW OF SYSTEMS: Out of a complete 14 system review of symptoms, the patient complains only of the following symptoms, and all other reviewed systems are negative.  Headache  ALLERGIES: No Known Allergies  HOME MEDICATIONS: Outpatient Medications Prior to Visit  Medication Sig Dispense Refill   ARIPiprazole, sensor, 2 MG TABS Take 2 mg by mouth daily. 30 tablet 1   busPIRone (BUSPAR) 30 MG tablet Take 1 tablet (30 mg total) by mouth 2 (two) times daily. 60 tablet 1   calcium-vitamin D (OSCAL WITH D) 250-125 MG-UNIT tablet Take 1 tablet by mouth daily.     escitalopram (LEXAPRO) 20 MG tablet Take 1 tablet (20 mg total) by mouth daily. 30 tablet 1   Multiple Vitamins-Minerals (MULTIVITAMIN PO) Take 1 tablet by mouth  daily.     omeprazole (PRILOSEC) 10 MG capsule Take 10 mg by mouth daily.     MAGNESIUM PO Take 1 tablet by mouth daily.     rizatriptan (MAXALT) 10 MG tablet TAKE 1 TABLET BY MOUTH 3 TIMES A DAY AS NEEDED FOR MIGRAINE 10 tablet 5   topiramate (TOPAMAX) 50 MG tablet Take 3 tablets (150 mg total) by mouth at bedtime. 270 tablet 3   No facility-administered medications prior to visit.    PAST MEDICAL HISTORY: Past Medical History:  Diagnosis Date   Anxiety    Bradycardia    Common migraine with intractable migraine 07/03/2016   Depression    Dizziness    Headache    Menopause    Syncope     PAST SURGICAL HISTORY: Past Surgical History:  Procedure Laterality Date   BACK SURGERY     cyst removal    BREAST SURGERY     breast reduction   BUNIONECTOMY     CHOLECYSTECTOMY     KNEE ARTHROSCOPY      FAMILY HISTORY: Family History  Problem Relation Age of Onset   Heart disease Mother    Heart disease Brother    Heart disease Maternal Grandmother    Heart disease Maternal Grandfather    Cancer Son        unknown    SOCIAL HISTORY: Social History   Socioeconomic History   Marital status: Significant Other    Spouse name: Not on file   Number of children: 2  Years of education: Masters   Highest education level: Not on file  Occupational History   Not on file  Tobacco Use   Smoking status: Never Smoker   Smokeless tobacco: Never Used  Vaping Use   Vaping Use: Never used  Substance and Sexual Activity   Alcohol use: Yes    Alcohol/week: 1.0 standard drink    Types: 1 Glasses of wine per week    Comment: daily   Drug use: No   Sexual activity: Yes    Partners: Male  Other Topics Concern   Not on file  Social History Narrative   Lives   Caffeine use:    Drinks 16oz caffeine drinks a day    Social Determinants of Radio broadcast assistant Strain:    Difficulty of Paying Living Expenses: Not on file  Food Insecurity:      Worried About Charity fundraiser in the Last Year: Not on file   [employer] of Food in the Last Year: Not on file  Transportation Needs:    Lack of Transportation (Medical): Not on file   Lack of Transportation (Non-Medical): Not on file  Physical Activity:    Days of Exercise per Week: Not on file   Minutes of Exercise per Session: Not on file  Stress:    Feeling of Stress : Not on file  Social Connections:    Frequency of Communication with Friends and Family: Not on file   Frequency of Social Gatherings with Friends and Family: Not on file   Attends Religious Services: Not on file   Active Member of Clubs or Organizations: Not on file   Attends Archivist Meetings: Not on file   Marital Status: Not on file  Intimate Partner Violence:    Fear of Current or Ex-Partner: Not on file   Emotionally Abused: Not on file   Physically Abused: Not on file   Sexually Abused: Not on file   PHYSICAL EXAM  Vitals:   01/24/20 1112  BP: 115/71  Pulse: (!) 59  Weight: 152 lb (68.9 kg)  Height: 5\' 5"  (1.651 m)   Body mass index is 25.29 kg/m.  Generalized: Well developed, in no acute distress   Neurological examination  Mentation: Alert oriented to time, place, history taking. Follows all commands speech and language fluent Cranial nerve II-XII: Pupils were equal round reactive to light. Extraocular movements were full, visual field were full on confrontational test. Facial sensation and strength were normal.  Head turning and shoulder shrug  were normal and symmetric. Motor: The motor testing reveals 5 over 5 strength of all 4 extremities. Good symmetric motor tone is noted throughout.  Sensory: Sensory testing is intact to soft touch on all 4 extremities. No evidence of extinction is noted.  Coordination: Cerebellar testing reveals good finger-nose-finger and heel-to-shin bilaterally.  Gait and station: Gait is normal.  Reflexes: Deep tendon reflexes are  symmetric and normal bilaterally.   DIAGNOSTIC DATA (LABS, IMAGING, TESTING) - I reviewed patient records, labs, notes, testing and imaging myself where available.  Lab Results  Component Value Date   WBC 5.3 08/06/2016   HGB 12.9 08/06/2016   HCT 39.1 08/06/2016   MCV 83.7 08/06/2016   PLT 166 08/06/2016      Component Value Date/Time   NA 141 08/06/2016 1207   K 3.7 08/06/2016 1207   CL 110 08/06/2016 1207   CO2 23 08/06/2016 1207   GLUCOSE 90 08/06/2016 1207   BUN 11  08/06/2016 1207   CREATININE 0.74 08/06/2016 1207   CREATININE 0.60 12/22/2013 1045   CALCIUM 9.2 08/06/2016 1207   PROT 6.3 (L) 08/06/2016 1207   ALBUMIN 4.1 08/06/2016 1207   AST 20 08/06/2016 1207   ALT 21 08/06/2016 1207   ALKPHOS 71 08/06/2016 1207   BILITOT 1.0 08/06/2016 1207   GFRNONAA >60 08/06/2016 1207   GFRAA >60 08/06/2016 1207   Lab Results  Component Value Date   CHOL 168 01/02/2015   HDL 53.70 01/02/2015   LDLCALC 96 01/02/2015   TRIG 91.0 01/02/2015   CHOLHDL 3 01/02/2015   No results found for: HGBA1C No results found for: VITAMINB12 Lab Results  Component Value Date   TSH 1.04 01/02/2015    ASSESSMENT AND PLAN 64 y.o. year old female  has a past medical history of Anxiety, Bradycardia, Common migraine with intractable migraine (07/03/2016), Depression, Dizziness, Headache, Menopause, and Syncope. here with:  1.  Chronic migraine headache  -Continue Topamax 150 mg at bedtime -Stop Maxalt, will try Nurtec for abortive therapy -Follow-up in 1 year or sooner if needed  I spent 20 minutes of face-to-face and non-face-to-face time with patient.  This included previsit chart review, lab review, study review, order entry, electronic health record documentation, patient education.  Butler Denmark, AGNP-C, DNP 01/24/2020, 11:40 AM [facility] [address], [place], [place] [address] [phone]

## 2020-01-24 NOTE — Patient Instructions (Signed)
Continue Topamax at current dosing Try Nurtec for acute headache, 1 tablet at onset of headache, max is 75 mg in 24 hours See you back in 1 year or sooner if needed  Rimegepant oral dissolving tablet What is this medicine? RIMEGEPANT (ri ME je pant) is used to treat migraine headaches with or without aura. An aura is a strange feeling or visual disturbance that warns you of an attack. It is not used to prevent migraines. This medicine may be used for other purposes; ask your health care provider or pharmacist if you have questions. COMMON BRAND NAME(S): NURTEC ODT What should I tell my health care provider before I take this medicine? They need to know if you have any of these conditions:  kidney disease  liver disease  an unusual or allergic reaction to rimegepant, other medicines, foods, dyes, or preservatives  pregnant or trying to get pregnant  breast-feeding How should I use this medicine? Take the medicine by mouth. Follow the directions on the prescription label. Leave the tablet in the sealed blister pack until you are ready to take it. With dry hands, open the blister and gently remove the tablet. If the tablet breaks or crumbles, throw it away and take a new tablet out of the blister pack. Place the tablet in the mouth and allow it to dissolve, and then swallow. Do not cut, crush, or chew this medicine. You do not need water to take this medicine. Talk to your pediatrician about the use of this medicine in children. Special care may be needed. Overdosage: If you think you have taken too much of this medicine contact a poison control center or emergency room at once. NOTE: This medicine is only for you. Do not share this medicine with others. What if I miss a dose? This does not apply. This medicine is not for regular use. What may interact with this medicine? This medicine may interact with the following medications:  certain medicines for fungal infections like fluconazole,  itraconazole  rifampin This list may not describe all possible interactions. Give your health care provider a list of all the medicines, herbs, non-prescription drugs, or dietary supplements you use. Also tell them if you smoke, drink alcohol, or use illegal drugs. Some items may interact with your medicine. What should I watch for while using this medicine? Visit your health care professional for regular checks on your progress. Tell your health care professional if your symptoms do not start to get better or if they get worse. What side effects may I notice from receiving this medicine? Side effects that you should report to your doctor or health care professional as soon as possible:  allergic reactions like skin rash, itching or hives; swelling of the face, lips, or tongue Side effects that usually do not require medical attention (report these to your doctor or health care professional if they continue or are bothersome):  nausea This list may not describe all possible side effects. Call your doctor for medical advice about side effects. You may report side effects to FDA at 1-800-FDA-1088. Where should I keep my medicine? Keep out of the reach of children. Store at room temperature between 15 and 30 degrees C (59 and 86 degrees F). Throw away any unused medicine after the expiration date. NOTE: This sheet is a summary. It may not cover all possible information. If you have questions about this medicine, talk to your doctor, pharmacist, or health care provider.  2020 Elsevier/Gold Standard (2018-07-06 00:21:31)

## 2020-01-25 ENCOUNTER — Telehealth (HOSPITAL_COMMUNITY): Payer: BLUE CROSS/BLUE SHIELD | Admitting: Psychiatry

## 2020-01-25 NOTE — Progress Notes (Signed)
I have read the note, and I agree with the clinical assessment and plan.  Noreene Boreman K Maryn Freelove   

## 2020-02-01 ENCOUNTER — Ambulatory Visit (INDEPENDENT_AMBULATORY_CARE_PROVIDER_SITE_OTHER): Payer: BLUE CROSS/BLUE SHIELD | Admitting: Psychology

## 2020-02-01 DIAGNOSIS — F332 Major depressive disorder, recurrent severe without psychotic features: Secondary | ICD-10-CM | POA: Diagnosis not present

## 2020-02-15 ENCOUNTER — Ambulatory Visit (INDEPENDENT_AMBULATORY_CARE_PROVIDER_SITE_OTHER): Payer: BLUE CROSS/BLUE SHIELD | Admitting: Psychology

## 2020-02-15 DIAGNOSIS — F332 Major depressive disorder, recurrent severe without psychotic features: Secondary | ICD-10-CM

## 2020-02-29 ENCOUNTER — Ambulatory Visit (INDEPENDENT_AMBULATORY_CARE_PROVIDER_SITE_OTHER): Payer: BLUE CROSS/BLUE SHIELD | Admitting: Psychology

## 2020-02-29 DIAGNOSIS — F332 Major depressive disorder, recurrent severe without psychotic features: Secondary | ICD-10-CM

## 2020-03-06 ENCOUNTER — Telehealth (INDEPENDENT_AMBULATORY_CARE_PROVIDER_SITE_OTHER): Payer: BLUE CROSS/BLUE SHIELD | Admitting: Psychiatry

## 2020-03-06 ENCOUNTER — Other Ambulatory Visit: Payer: Self-pay

## 2020-03-06 DIAGNOSIS — F411 Generalized anxiety disorder: Secondary | ICD-10-CM | POA: Diagnosis not present

## 2020-03-06 DIAGNOSIS — F331 Major depressive disorder, recurrent, moderate: Secondary | ICD-10-CM

## 2020-03-06 MED ORDER — ARIPIPRAZOLE 5 MG PO TABS: 5.0000 mg | ORAL_TABLET | Freq: Every day | ORAL | 1 refills | Status: DC

## 2020-03-06 NOTE — Progress Notes (Signed)
BH MD/PA/NP OP Progress Note  03/06/2020 10:20 AM Sharon Cole  MRN:  [id number]  Chief Complaint: Medication management appointment HPI: This appointment was conducted via phone related to Covid epidemic precautions.  Patient's identity verified.   Limitations associated with this type of communication have been reviewed.   Location of parties- [facility] outpatient clinic Duration 25 minutes  64 year old female, lives with fianc, history of anxiety/depression.  Has been diagnosed with GAD and MDD in the past .   Sharon Cole reports she has been experiencing some increased depression.  Attributes this in part to ongoing family stressors, mostly pertaining to her son, who has a history of psychiatric illness.  She worries about him.  She is happy, however, that he recently started seeing a new psychiatrist and has been started on a new med regimen.  She also continues to grieve the death of her mother earlier this year.  States, for example, that he has an upcoming family wedding, and will be the first significant family function without her mother. She describes some anhedonia and decreased energy level.  She is sleeping well (uses melatonin PRN) , she describes normal appetite although states that she generally consumes only about 1300 cal/day in order to maintain weight. She denies suicidal ideations and remains future oriented.  *Contracts for safety, and states that if she did develop such ideation she would seek prompt care/go to ED. No psychotic symptoms are noted or reported. She is currently seeing a therapist for individual based psychotherapy. She denies medication side effects, and states she is tolerating medications well..  We reviewed medication side effects, including possible risk of serotonin syndrome  We reviewed treatment options to include augmentation strategies/adding another antidepressant to regimen ( such as Remeron or Wellbutrin).  Of  note, patient has been taking Lexapro at 20 mg daily for years and states this medication has been generally effective and well-tolerated.  At this time preference is to titrate Abilify, which she has tolerated well thus far.       Visit Diagnosis: MDD by history Past Psychiatric History:   Past Medical History:  Past Medical History:  Diagnosis Date  . Anxiety   . Bradycardia   . Common migraine with intractable migraine 07/03/2016  . Depression   . Dizziness   . Headache   . Menopause   . Syncope     Past Surgical History:  Procedure Laterality Date  . BACK SURGERY     cyst removal   . BREAST SURGERY     breast reduction  . BUNIONECTOMY    . CHOLECYSTECTOMY    . KNEE ARTHROSCOPY      Family Psychiatric History:   Family History:  Family History  Problem Relation Age of Onset  . Heart disease Mother   . Heart disease Brother   . Heart disease Maternal Grandmother   . Heart disease Maternal Grandfather   . Cancer Son        unknown    Social History:  Social History   Socioeconomic History  . Marital status: Significant Other    Spouse name: Not on file  . Number of children: 2  . Years of education: Masters  . Highest education level: Not on file  Occupational History  . Not on file  Tobacco Use  . Smoking status: Never Smoker  . Smokeless tobacco: Never Used  Vaping Use  . Vaping Use: Never used  Substance and Sexual Activity  . Alcohol use: Yes  Alcohol/week: 1.0 standard drink    Types: 1 Glasses of wine per week    Comment: daily  . Drug use: No  . Sexual activity: Yes    Partners: Male  Other Topics Concern  . Not on file  Social History Narrative   Lives   Caffeine use:    Drinks 16oz caffeine drinks a day    Social Determinants of Health   Financial Resource Strain:   . Difficulty of Paying Living Expenses: Not on file  Food Insecurity:   . Worried About Charity fundraiser in the Last Year: Not on file  . Ran Out of Food in  the Last Year: Not on file  Transportation Needs:   . Lack of Transportation (Medical): Not on file  . Lack of Transportation (Non-Medical): Not on file  Physical Activity:   . Days of Exercise per Week: Not on file  . Minutes of Exercise per Session: Not on file  Stress:   . Feeling of Stress : Not on file  Social Connections:   . Frequency of Communication with Friends and Family: Not on file  . Frequency of Social Gatherings with Friends and Family: Not on file  . Attends Religious Services: Not on file  . Active Member of Clubs or Organizations: Not on file  . Attends Archivist Meetings: Not on file  . Marital Status: Not on file    Allergies: No Known Allergies  Metabolic Disorder Labs: No results found for: HGBA1C, MPG No results found for: PROLACTIN Lab Results  Component Value Date   CHOL 168 01/02/2015   TRIG 91.0 01/02/2015   HDL 53.70 01/02/2015   CHOLHDL 3 01/02/2015   VLDL 18.2 01/02/2015   LDLCALC 96 01/02/2015   LDLCALC 98 08/10/2013   Lab Results  Component Value Date   TSH 1.04 01/02/2015   TSH 2.986 08/10/2013    Therapeutic Level Labs: No results found for: LITHIUM No results found for: VALPROATE No components found for:  CBMZ  Current Medications: Current Outpatient Medications  Medication Sig Dispense Refill  . ARIPiprazole, sensor, 2 MG TABS Take 2 mg by mouth daily. 30 tablet 1  . busPIRone (BUSPAR) 30 MG tablet Take 1 tablet (30 mg total) by mouth 2 (two) times daily. 60 tablet 1  . calcium-vitamin D (OSCAL WITH D) 250-125 MG-UNIT tablet Take 1 tablet by mouth daily.    Marland Kitchen escitalopram (LEXAPRO) 20 MG tablet Take 1 tablet (20 mg total) by mouth daily. 30 tablet 1  . Multiple Vitamins-Minerals (MULTIVITAMIN PO) Take 1 tablet by mouth daily.    Marland Kitchen omeprazole (PRILOSEC) 10 MG capsule Take 10 mg by mouth daily.    . Rimegepant Sulfate (NURTEC) 75 MG TBDP Take 75 mg by mouth as needed (take 1 at onset of headache, max is 1 tablet in 24  hours). 8 tablet 11  . topiramate (TOPAMAX) 50 MG tablet Take 3 tablets (150 mg total) by mouth at bedtime. 270 tablet 3   No current facility-administered medications for this visit.       Psychiatric Specialty Exam: Please take into account limitations in obtaining a full mental status exam in the context of this mode of communication Review of Systems does not endorse medication side effects- at this time  There were no vitals taken for this visit.There is no height or weight on file to calculate BMI.  General Appearance: NA  Eye Contact:  NA  Speech:  Normal Rate  Volume:  Normal  Mood: Reports some increased depression  Affect: Appropriate, appears reactive  Thought Process:  Linear and Descriptions of Associations: Intact  Orientation:  Full (Time, Place, and Person)  Thought Content: No hallucinations, no delusions   Suicidal Thoughts:  No denies suicidal or self-injurious ideations  Homicidal Thoughts:  No  Memory:  Recent and remote grossly intact  Judgement:  Other:  Present  Insight:  Present  Psychomotor Activity:  NA  Concentration:  Concentration: Good and Attention Span: Good  Recall:  Good  Fund of Knowledge: Good  Language: Good  Akathisia:  Negative  Handed:  Right  AIMS (if indicated):   Assets:  Communication Skills Desire for Improvement Resilience  ADL's:  Intact  Cognition: WNL  Sleep:  Good   Screenings:   Assessment and Plan:  65 year old female with a history of depression/anxiety.    She reports she has been feeling more depressed recently.  Attributes this in part to concerns about her adult son's mental health, although states that she is happy he is now in treatment with a psychiatrist and on medications.  She also has an upcoming important family function and states it will be the first such function without her mother, who passed away earlier this year. She is tolerating medications well, without side effects.  We discussed options and  at this time preference is to increase Abilify from 2 mg daily to 5 mg daily.  BuSpar and Lexapro will be continued at the same dose.  We also discussed nutritional supplements/multivitamin as she reports she has been restricting to about 1300-calorie daily. She plans to continue seeing her therapist for individual psychotherapy on a regular basis. She agrees to contact clinic if  any worsening or concern prior and agrees to go to ED if needed . Continue Buspar 30 mgrs BID, Lexapro 20 mgrs QDAY, increase Abilify to 5 mgrs QDAY. Next appointment in about 3 weeks.     Jenne Campus, MD 03/06/2020, 10:20 AM

## 2020-03-14 ENCOUNTER — Ambulatory Visit (INDEPENDENT_AMBULATORY_CARE_PROVIDER_SITE_OTHER): Payer: BLUE CROSS/BLUE SHIELD | Admitting: Psychology

## 2020-03-14 DIAGNOSIS — F332 Major depressive disorder, recurrent severe without psychotic features: Secondary | ICD-10-CM

## 2020-03-20 ENCOUNTER — Other Ambulatory Visit: Payer: Self-pay

## 2020-03-20 ENCOUNTER — Telehealth (INDEPENDENT_AMBULATORY_CARE_PROVIDER_SITE_OTHER): Payer: BLUE CROSS/BLUE SHIELD | Admitting: Psychiatry

## 2020-03-20 DIAGNOSIS — F411 Generalized anxiety disorder: Secondary | ICD-10-CM | POA: Diagnosis not present

## 2020-03-20 DIAGNOSIS — F331 Major depressive disorder, recurrent, moderate: Secondary | ICD-10-CM

## 2020-03-20 MED ORDER — ESCITALOPRAM OXALATE 20 MG PO TABS: 20.0000 mg | ORAL_TABLET | Freq: Every day | ORAL | 1 refills | Status: DC

## 2020-03-20 MED ORDER — ARIPIPRAZOLE 5 MG PO TABS: 5.0000 mg | ORAL_TABLET | Freq: Every day | ORAL | 0 refills | Status: DC

## 2020-03-20 MED ORDER — BUSPIRONE HCL 30 MG PO TABS: 30.0000 mg | ORAL_TABLET | Freq: Two times a day (BID) | ORAL | 1 refills | Status: DC

## 2020-03-20 NOTE — Progress Notes (Signed)
[facility] MD/PA/NP OP Progress Note  03/20/2020 3:30 PM Sharon Cole  MRN:  [id number]  Chief Complaint: Medication management appointment HPI: This appointment was conducted via phone related to Covid epidemic precautions.  Patient's identity verified using two identifiers .   Limitations associated with this type of communication have been reviewed.   Location of parties- Patient-Home MD-behavioral services outpatient clinic Duration 20 minutes   Sharon Cole reports she is feeling well/ better . States her mood has tended to improve and currently presents euthymic/denies depression or significant neuro-vegetative symptoms.No suicidal ideations, presents future oriented . She had been anxious and saddened about an upcoming family wedding as it will be the first important family venue that her mother does not go to ( she passed away several months ago). Today states that this has not been bothering her as it had been and that she realizes the focus is on her niece who is marrying and that it is going to be a joyful occasion. She also reports her son, who has history of psychiatric disorder , is doing better and currently more stable on his medications , which has also contributed to her feeling better and less anxious .  She denies medication side effects, and denies any symptoms suggestive of serotonin syndrome, which we have reviewed as a potential serious side effect/drug-drug interaction She is currently on Buspar 30 mgr BID , Abilify 5 mgr QDAY and on Lexapro 20 mg QDAY. She feels current regimen is effective and well tolerated .  She also takes Topamax for migraine prophylaxis ( long term) , and occasionally Nurtec for migraines. Denies medication drug-drug interactions.           Visit Diagnosis: MDD by history Past Psychiatric History:   Past Medical History:  Past Medical History:  Diagnosis Date  . Anxiety   . Bradycardia   . Common migraine with intractable migraine  07/03/2016  . Depression   . Dizziness   . Headache   . Menopause   . Syncope     Past Surgical History:  Procedure Laterality Date  . BACK SURGERY     cyst removal   . BREAST SURGERY     breast reduction  . BUNIONECTOMY    . CHOLECYSTECTOMY    . KNEE ARTHROSCOPY      Family Psychiatric History:   Family History:  Family History  Problem Relation Age of Onset  . Heart disease Mother   . Heart disease Brother   . Heart disease Maternal Grandmother   . Heart disease Maternal Grandfather   . Cancer Son        unknown    Social History:  Social History   Socioeconomic History  . Marital status: Significant Other    Spouse name: Not on file  . Number of children: 2  . Years of education: Masters  . Highest education level: Not on file  Occupational History  . Not on file  Tobacco Use  . Smoking status: Never Smoker  . Smokeless tobacco: Never Used  Vaping Use  . Vaping Use: Never used  Substance and Sexual Activity  . Alcohol use: Yes    Alcohol/week: 1.0 standard drink    Types: 1 Glasses of wine per week    Comment: daily  . Drug use: No  . Sexual activity: Yes    Partners: Male  Other Topics Concern  . Not on file  Social History Narrative   Lives   Caffeine use:    Drinks  16oz caffeine drinks a day    Social Determinants of Health   Financial Resource Strain:   . Difficulty of Paying Living Expenses: Not on file  Food Insecurity:   . Worried About Charity fundraiser in the Last Year: Not on file  . Ran Out of Food in the Last Year: Not on file  Transportation Needs:   . Lack of Transportation (Medical): Not on file  . Lack of Transportation (Non-Medical): Not on file  Physical Activity:   . Days of Exercise per Week: Not on file  . Minutes of Exercise per Session: Not on file  Stress:   . Feeling of Stress : Not on file  Social Connections:   . Frequency of Communication with Friends and Family: Not on file  . Frequency of Social  Gatherings with Friends and Family: Not on file  . Attends Religious Services: Not on file  . Active Member of Clubs or Organizations: Not on file  . Attends Archivist Meetings: Not on file  . Marital Status: Not on file    Allergies: No Known Allergies  Metabolic Disorder Labs: No results found for: HGBA1C, MPG No results found for: PROLACTIN Lab Results  Component Value Date   CHOL 168 01/02/2015   TRIG 91.0 01/02/2015   HDL 53.70 01/02/2015   CHOLHDL 3 01/02/2015   VLDL 18.2 01/02/2015   LDLCALC 96 01/02/2015   LDLCALC 98 08/10/2013   Lab Results  Component Value Date   TSH 1.04 01/02/2015   TSH 2.986 08/10/2013    Therapeutic Level Labs: No results found for: LITHIUM No results found for: VALPROATE No components found for:  CBMZ  Current Medications: Current Outpatient Medications  Medication Sig Dispense Refill  . ARIPiprazole (ABILIFY) 5 MG tablet Take 1 tablet (5 mg total) by mouth daily. 30 tablet 1  . busPIRone (BUSPAR) 30 MG tablet Take 1 tablet (30 mg total) by mouth 2 (two) times daily. 60 tablet 1  . calcium-vitamin D (OSCAL WITH D) 250-125 MG-UNIT tablet Take 1 tablet by mouth daily.    Marland Kitchen escitalopram (LEXAPRO) 20 MG tablet Take 1 tablet (20 mg total) by mouth daily. 30 tablet 1  . Multiple Vitamins-Minerals (MULTIVITAMIN PO) Take 1 tablet by mouth daily.    Marland Kitchen omeprazole (PRILOSEC) 10 MG capsule Take 10 mg by mouth daily.    . Rimegepant Sulfate (NURTEC) 75 MG TBDP Take 75 mg by mouth as needed (take 1 at onset of headache, max is 1 tablet in 24 hours). 8 tablet 11  . topiramate (TOPAMAX) 50 MG tablet Take 3 tablets (150 mg total) by mouth at bedtime. 270 tablet 3   No current facility-administered medications for this visit.       Psychiatric Specialty Exam: Please take into account limitations in obtaining a full mental status exam in the context of this mode of communication Review of Systems does not endorse medication side effects- at  this time  There were no vitals taken for this visit.There is no height or weight on file to calculate BMI.  General Appearance: NA  Eye Contact:  NA  Speech:  Normal Rate  Volume:  Normal  Mood:  reports mood as improved and remains euthymic   Affect:  Appropriate and Full Range  Thought Process:  Linear and Descriptions of Associations: Intact  Orientation:  Full (Time, Place, and Person)  Thought Content: No hallucinations, no delusions   Suicidal Thoughts:  No denies suicidal or self-injurious ideations  Homicidal  Thoughts:  No  Memory:  Recent and remote grossly intact  Judgement:  Other:  Present  Insight:  Present  Psychomotor Activity:  NA  Concentration:  Concentration: Good and Attention Span: Good  Recall:  Good  Fund of Knowledge: Good  Language: Good  Akathisia:  Negative  Handed:  Right  AIMS (if indicated):   Assets:  Communication Skills Desire for Improvement Resilience  ADL's:  Intact  Cognition: WNL  Sleep:  Good   Screenings:   Assessment and Plan:   Ms Hogan reports she is doing well at this time and currently describes being euthymic and presents with a full range of affect. No SI, future oriented , looking forward to having family over this weekend and attending her niece's wedding . Tolerating medications well without side effects at this time. Continue Buspar 30 mg BID, Lexapro 20 mgr BID, Abilify 5 mgr QDAY. Will see in about 6 weeks, agrees to contact clinic sooner if any worsening prior      Jenne Campus, MD 03/20/2020, 3:30 PM

## 2020-03-28 ENCOUNTER — Ambulatory Visit (INDEPENDENT_AMBULATORY_CARE_PROVIDER_SITE_OTHER): Payer: BLUE CROSS/BLUE SHIELD | Admitting: Psychology

## 2020-03-28 DIAGNOSIS — F332 Major depressive disorder, recurrent severe without psychotic features: Secondary | ICD-10-CM | POA: Diagnosis not present

## 2020-04-05 ENCOUNTER — Ambulatory Visit (INDEPENDENT_AMBULATORY_CARE_PROVIDER_SITE_OTHER): Payer: BLUE CROSS/BLUE SHIELD | Admitting: Psychology

## 2020-04-05 DIAGNOSIS — F332 Major depressive disorder, recurrent severe without psychotic features: Secondary | ICD-10-CM

## 2020-04-11 ENCOUNTER — Ambulatory Visit (INDEPENDENT_AMBULATORY_CARE_PROVIDER_SITE_OTHER): Payer: Self-pay | Admitting: Psychology

## 2020-04-11 DIAGNOSIS — F332 Major depressive disorder, recurrent severe without psychotic features: Secondary | ICD-10-CM

## 2020-04-25 ENCOUNTER — Ambulatory Visit (INDEPENDENT_AMBULATORY_CARE_PROVIDER_SITE_OTHER): Payer: Self-pay | Admitting: Psychology

## 2020-04-25 DIAGNOSIS — F332 Major depressive disorder, recurrent severe without psychotic features: Secondary | ICD-10-CM

## 2020-05-09 ENCOUNTER — Ambulatory Visit (INDEPENDENT_AMBULATORY_CARE_PROVIDER_SITE_OTHER): Payer: Self-pay | Admitting: Psychology

## 2020-05-09 DIAGNOSIS — F332 Major depressive disorder, recurrent severe without psychotic features: Secondary | ICD-10-CM

## 2020-05-15 ENCOUNTER — Telehealth (INDEPENDENT_AMBULATORY_CARE_PROVIDER_SITE_OTHER): Payer: BLUE CROSS/BLUE SHIELD | Admitting: Psychiatry

## 2020-05-15 ENCOUNTER — Other Ambulatory Visit: Payer: Self-pay

## 2020-05-15 DIAGNOSIS — F331 Major depressive disorder, recurrent, moderate: Secondary | ICD-10-CM

## 2020-05-15 DIAGNOSIS — F411 Generalized anxiety disorder: Secondary | ICD-10-CM | POA: Diagnosis not present

## 2020-05-15 MED ORDER — BUSPIRONE HCL 30 MG PO TABS: 30.0000 mg | ORAL_TABLET | Freq: Two times a day (BID) | ORAL | 0 refills | Status: DC

## 2020-05-15 MED ORDER — ESCITALOPRAM OXALATE 20 MG PO TABS: 20.0000 mg | ORAL_TABLET | Freq: Every day | ORAL | 1 refills | Status: DC

## 2020-05-15 MED ORDER — ARIPIPRAZOLE 5 MG PO TABS: 5.0000 mg | ORAL_TABLET | Freq: Every day | ORAL | 0 refills | Status: DC
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
## 2020-05-17 ENCOUNTER — Ambulatory Visit (INDEPENDENT_AMBULATORY_CARE_PROVIDER_SITE_OTHER): Payer: BLUE CROSS/BLUE SHIELD | Admitting: Psychology

## 2020-05-17 DIAGNOSIS — F332 Major depressive disorder, recurrent severe without psychotic features: Secondary | ICD-10-CM

## 2020-05-23 ENCOUNTER — Ambulatory Visit (INDEPENDENT_AMBULATORY_CARE_PROVIDER_SITE_OTHER): Payer: BLUE CROSS/BLUE SHIELD | Admitting: Psychology

## 2020-05-23 DIAGNOSIS — F332 Major depressive disorder, recurrent severe without psychotic features: Secondary | ICD-10-CM

## 2020-06-06 ENCOUNTER — Ambulatory Visit (INDEPENDENT_AMBULATORY_CARE_PROVIDER_SITE_OTHER): Payer: BLUE CROSS/BLUE SHIELD | Admitting: Psychology

## 2020-06-06 DIAGNOSIS — F332 Major depressive disorder, recurrent severe without psychotic features: Secondary | ICD-10-CM

## 2020-06-12 ENCOUNTER — Telehealth (INDEPENDENT_AMBULATORY_CARE_PROVIDER_SITE_OTHER): Payer: BLUE CROSS/BLUE SHIELD | Admitting: Psychiatry

## 2020-06-12 ENCOUNTER — Other Ambulatory Visit: Payer: Self-pay

## 2020-06-12 DIAGNOSIS — F411 Generalized anxiety disorder: Secondary | ICD-10-CM | POA: Diagnosis not present

## 2020-06-12 DIAGNOSIS — F331 Major depressive disorder, recurrent, moderate: Secondary | ICD-10-CM | POA: Diagnosis not present

## 2020-06-12 MED ORDER — ARIPIPRAZOLE 5 MG PO TABS: 5.0000 mg | ORAL_TABLET | Freq: Every day | ORAL | 1 refills | Status: DC

## 2020-06-12 MED ORDER — ESCITALOPRAM OXALATE 20 MG PO TABS: 20.0000 mg | ORAL_TABLET | Freq: Every day | ORAL | 1 refills | Status: DC

## 2020-06-12 MED ORDER — BUSPIRONE HCL 30 MG PO TABS: 30.0000 mg | ORAL_TABLET | Freq: Two times a day (BID) | ORAL | 1 refills | Status: DC

## 2020-06-12 NOTE — Progress Notes (Signed)
[place] MD/PA/NP OP Progress Note  06/12/2020 1:15 PM Sharon Cole  MRN:  [id number]  Chief Complaint: Medication management appointment HPI: This appointment was conducted via phone related to Covid epidemic precautions.  Patient's identity verified.   Limitations associated with this type of communication have been reviewed.   Location of parties- Patient-Home MD-behavioral services outpatient clinic Duration 8 minutes  65 year old female, lives with fianc, history of anxiety/depression.  Has been diagnosed with GAD and MDD in the past .   She reports she has been doing better which she attributes to her current medication management . She reports some lingering anxiety and self esteem concerns which she states are likely related to her complicated relationship with her mother, who passed away last year . She has been working with her therapist regularly about this , and is currently seeing therapist once a week .  Another stressor relates to her son, who has history of mental illness and behavioral issues, episodes of explosiveness . Fortunately, she reports that he has been significantly better with recent medications his psychiatrist has made and has had no recent episodes .  She reports her fiance is very supportive . She has been working on her artwork.   Denies any suicidal ideations, denies significant neuro-vegetative symptoms, no psychotic symptoms.       Visit Diagnosis: MDD by history Past Psychiatric History:   Past Medical History:  Past Medical History:  Diagnosis Date  . Anxiety   . Bradycardia   . Common migraine with intractable migraine 07/03/2016  . Depression   . Dizziness   . Headache   . Menopause   . Syncope     Past Surgical History:  Procedure Laterality Date  . BACK SURGERY     cyst removal   . BREAST SURGERY     breast reduction  . BUNIONECTOMY    . CHOLECYSTECTOMY    . KNEE ARTHROSCOPY      Family Psychiatric History:   Family  History:  Family History  Problem Relation Age of Onset  . Heart disease Mother   . Heart disease Brother   . Heart disease Maternal Grandmother   . Heart disease Maternal Grandfather   . Cancer Son        unknown    Social History:  Social History   Socioeconomic History  . Marital status: Significant Other    Spouse name: Not on file  . Number of children: 2  . Years of education: Masters  . Highest education level: Not on file  Occupational History  . Not on file  Tobacco Use  . Smoking status: Never Smoker  . Smokeless tobacco: Never Used  Vaping Use  . Vaping Use: Never used  Substance and Sexual Activity  . Alcohol use: Yes    Alcohol/week: 1.0 standard drink    Types: 1 Glasses of wine per week    Comment: daily  . Drug use: No  . Sexual activity: Yes    Partners: Male  Other Topics Concern  . Not on file  Social History Narrative   Lives   Caffeine use:    Drinks 16oz caffeine drinks a day    Social Determinants of Radio broadcast assistant Strain: Not on file  Food Insecurity: Not on file  Transportation Needs: Not on file  Physical Activity: Not on file  Stress: Not on file  Social Connections: Not on file    Allergies: No Known Allergies  Metabolic Disorder Labs: No results  found for: HGBA1C, MPG No results found for: PROLACTIN Lab Results  Component Value Date   CHOL 168 01/02/2015   TRIG 91.0 01/02/2015   HDL 53.70 01/02/2015   CHOLHDL 3 01/02/2015   VLDL 18.2 01/02/2015   LDLCALC 96 01/02/2015   LDLCALC 98 08/10/2013   Lab Results  Component Value Date   TSH 1.04 01/02/2015   TSH 2.986 08/10/2013    Therapeutic Level Labs: No results found for: LITHIUM No results found for: VALPROATE No components found for:  CBMZ  Current Medications: Current Outpatient Medications  Medication Sig Dispense Refill  . ARIPiprazole (ABILIFY) 5 MG tablet Take 1 tablet (5 mg total) by mouth daily. 30 tablet 0  . busPIRone (BUSPAR) 30 MG  tablet Take 1 tablet (30 mg total) by mouth 2 (two) times daily. 60 tablet 0  . calcium-vitamin D (OSCAL WITH D) 250-125 MG-UNIT tablet Take 1 tablet by mouth daily.    Marland Kitchen escitalopram (LEXAPRO) 20 MG tablet Take 1 tablet (20 mg total) by mouth daily. 30 tablet 1  . Multiple Vitamins-Minerals (MULTIVITAMIN PO) Take 1 tablet by mouth daily.    Marland Kitchen omeprazole (PRILOSEC) 10 MG capsule Take 10 mg by mouth daily.    . Rimegepant Sulfate (NURTEC) 75 MG TBDP Take 75 mg by mouth as needed (take 1 at onset of headache, max is 1 tablet in 24 hours). 8 tablet 11  . topiramate (TOPAMAX) 50 MG tablet Take 3 tablets (150 mg total) by mouth at bedtime. 270 tablet 3   No current facility-administered medications for this visit.       Psychiatric Specialty Exam: Please take into account limitations in obtaining a full mental status exam in the context of this mode of communication Review of Systems does not endorse medication side effects- at this time  There were no vitals taken for this visit.There is no height or weight on file to calculate BMI.  General Appearance: NA  Eye Contact:  NA  Speech:  Normal Rate  Volume:  Normal  Mood: reports mood improving, reports some residual, but improved, depression and anxiety   Affect: Appropriate  Thought Process:  Linear and Descriptions of Associations: Intact  Orientation:  Full (Time, Place, and Person)  Thought Content: No hallucinations, no delusions   Suicidal Thoughts:  No denies suicidal or self-injurious ideations  Homicidal Thoughts:  No  Memory:  Recent and remote grossly intact  Judgement:  Other:  Present  Insight:  Present  Psychomotor Activity:  NA  Concentration:  Concentration: Good and Attention Span: Good  Recall:  Good  Fund of Knowledge: Good  Language: Good  Akathisia:  Negative  Handed:  Right  AIMS (if indicated):   Assets:  Communication Skills Desire for Improvement Resilience  ADL's:  Intact  Cognition: WNL  Sleep:  Good    Screenings:   Assessment and Plan:  65 year old female with a history of depression/anxiety.    Patient reports overall improvement in mood , and states current medication regimen has been helpful and well tolerated . Denies side effects, which have been reviewed.  She identifies chronic unresolved self esteem issues which she attributes to her relationship with her mother ( who is now deceased ) growing up, and which she is working on with her therapist, whom she sees regularly   Continue individual psychotherapy Will see in 4 weeks , agrees to contact clinic sooner if any worsening prior. Continue current medication regimen. (Buspar 30 mgrs BID, Lexapro 20 mgrs QDAY, increase Abilify  to 5 mgrs QDAY.)      Jenne Campus, MD 06/12/2020, 1:15 PM

## 2020-06-14 ENCOUNTER — Ambulatory Visit (INDEPENDENT_AMBULATORY_CARE_PROVIDER_SITE_OTHER): Payer: BLUE CROSS/BLUE SHIELD | Admitting: Psychology

## 2020-06-14 DIAGNOSIS — F332 Major depressive disorder, recurrent severe without psychotic features: Secondary | ICD-10-CM

## 2020-06-20 ENCOUNTER — Ambulatory Visit (INDEPENDENT_AMBULATORY_CARE_PROVIDER_SITE_OTHER): Payer: BLUE CROSS/BLUE SHIELD | Admitting: Psychology

## 2020-06-20 DIAGNOSIS — F332 Major depressive disorder, recurrent severe without psychotic features: Secondary | ICD-10-CM

## 2020-07-04 ENCOUNTER — Ambulatory Visit (INDEPENDENT_AMBULATORY_CARE_PROVIDER_SITE_OTHER): Payer: BLUE CROSS/BLUE SHIELD | Admitting: Psychology

## 2020-07-04 DIAGNOSIS — F332 Major depressive disorder, recurrent severe without psychotic features: Secondary | ICD-10-CM | POA: Diagnosis not present

## 2020-07-10 ENCOUNTER — Telehealth (INDEPENDENT_AMBULATORY_CARE_PROVIDER_SITE_OTHER): Payer: BLUE CROSS/BLUE SHIELD | Admitting: Psychiatry

## 2020-07-10 ENCOUNTER — Other Ambulatory Visit: Payer: Self-pay

## 2020-07-10 DIAGNOSIS — F331 Major depressive disorder, recurrent, moderate: Secondary | ICD-10-CM | POA: Diagnosis not present

## 2020-07-10 DIAGNOSIS — F411 Generalized anxiety disorder: Secondary | ICD-10-CM | POA: Diagnosis not present

## 2020-07-10 DIAGNOSIS — F3342 Major depressive disorder, recurrent, in full remission: Secondary | ICD-10-CM | POA: Diagnosis not present

## 2020-07-10 MED ORDER — ARIPIPRAZOLE 5 MG PO TABS: 5.0000 mg | ORAL_TABLET | Freq: Every day | ORAL | 0 refills | Status: DC

## 2020-07-10 MED ORDER — ESCITALOPRAM OXALATE 20 MG PO TABS: 20.0000 mg | ORAL_TABLET | Freq: Every day | ORAL | 1 refills | Status: DC

## 2020-07-10 MED ORDER — BUSPIRONE HCL 30 MG PO TABS: 30.0000 mg | ORAL_TABLET | Freq: Two times a day (BID) | ORAL | 1 refills | Status: DC

## 2020-07-10 NOTE — Progress Notes (Signed)
[facility] MD/PA/NP OP Progress Note  07/10/2020 2:25 PM Sharon Cole  MRN:  [id number]  Chief Complaint: Medication management appointment HPI: This appointment was conducted via phone related to Covid epidemic precautions.  Patient's identity verified.   Limitations associated with this type of communication have been reviewed.   Location of parties- Patient-Home MD-behavioral services outpatient clinic Duration 41 minutes  65 year old female, lives with fianc, history of anxiety/depression.  Has been diagnosed with GAD and MDD in the past .   She reports she has been doing well.  Describes mood as stable, without significant depression at this time, reports that overall she has been feeling better and that her mood has improved.  Anxiety symptoms (primarily worry and anxious ruminations, often related to family stressors) also improved.  She has an adult son with whom she has a difficult relationship, but states she feels she has been able to manage this better, set limits, and in particular avoid feeling guilty during interactions with him, which had been an issue for her.  No suicidal ideations. Currently denies neurovegetative symptoms and in fact has been working on art/sculpture projects and states she is excited about the possibility of having some of her art show in a gallery.  No medication side effects reported.     Visit Diagnosis: MDD by history Past Psychiatric History:   Past Medical History:  Past Medical History:  Diagnosis Date  . Anxiety   . Bradycardia   . Common migraine with intractable migraine 07/03/2016  . Depression   . Dizziness   . Headache   . Menopause   . Syncope     Past Surgical History:  Procedure Laterality Date  . BACK SURGERY     cyst removal   . BREAST SURGERY     breast reduction  . BUNIONECTOMY    . CHOLECYSTECTOMY    . KNEE ARTHROSCOPY      Family Psychiatric History:   Family History:  Family History  Problem Relation Age of  Onset  . Heart disease Mother   . Heart disease Brother   . Heart disease Maternal Grandmother   . Heart disease Maternal Grandfather   . Cancer Son        unknown    Social History:  Social History   Socioeconomic History  . Marital status: Significant Other    Spouse name: Not on file  . Number of children: 2  . Years of education: Masters  . Highest education level: Not on file  Occupational History  . Not on file  Tobacco Use  . Smoking status: Never Smoker  . Smokeless tobacco: Never Used  Vaping Use  . Vaping Use: Never used  Substance and Sexual Activity  . Alcohol use: Yes    Alcohol/week: 1.0 standard drink    Types: 1 Glasses of wine per week    Comment: daily  . Drug use: No  . Sexual activity: Yes    Partners: Male  Other Topics Concern  . Not on file  Social History Narrative   Lives   Caffeine use:    Drinks 16oz caffeine drinks a day    Social Determinants of Radio broadcast assistant Strain: Not on file  Food Insecurity: Not on file  Transportation Needs: Not on file  Physical Activity: Not on file  Stress: Not on file  Social Connections: Not on file    Allergies: No Known Allergies  Metabolic Disorder Labs: No results found for: HGBA1C, MPG No results  found for: PROLACTIN Lab Results  Component Value Date   CHOL 168 01/02/2015   TRIG 91.0 01/02/2015   HDL 53.70 01/02/2015   CHOLHDL 3 01/02/2015   VLDL 18.2 01/02/2015   LDLCALC 96 01/02/2015   LDLCALC 98 08/10/2013   Lab Results  Component Value Date   TSH 1.04 01/02/2015   TSH 2.986 08/10/2013    Therapeutic Level Labs: No results found for: LITHIUM No results found for: VALPROATE No components found for:  CBMZ  Current Medications: Current Outpatient Medications  Medication Sig Dispense Refill  . ARIPiprazole (ABILIFY) 5 MG tablet Take 1 tablet (5 mg total) by mouth daily. 30 tablet 1  . busPIRone (BUSPAR) 30 MG tablet Take 1 tablet (30 mg total) by mouth 2 (two)  times daily. 60 tablet 1  . calcium-vitamin D (OSCAL WITH D) 250-125 MG-UNIT tablet Take 1 tablet by mouth daily.    Marland Kitchen escitalopram (LEXAPRO) 20 MG tablet Take 1 tablet (20 mg total) by mouth daily. 30 tablet 1  . Multiple Vitamins-Minerals (MULTIVITAMIN PO) Take 1 tablet by mouth daily.    Marland Kitchen omeprazole (PRILOSEC) 10 MG capsule Take 10 mg by mouth daily.    . Rimegepant Sulfate (NURTEC) 75 MG TBDP Take 75 mg by mouth as needed (take 1 at onset of headache, max is 1 tablet in 24 hours). 8 tablet 11  . topiramate (TOPAMAX) 50 MG tablet Take 3 tablets (150 mg total) by mouth at bedtime. 270 tablet 3   No current facility-administered medications for this visit.       Psychiatric Specialty Exam: Please take into account limitations in obtaining a full mental status exam in the context of this mode of communication Review of Systems does not endorse  There were no vitals taken for this visit.There is no height or weight on file to calculate BMI.  General Appearance: NA  Eye Contact:  NA  Speech:  Normal Rate  Volume:  Normal  Mood: Reports improved mood and at this time appears euthymic  Affect: Appropriate, full in range  Thought Process:  Linear and Descriptions of Associations: Intact  Orientation:  Full (Time, Place, and Person)  Thought Content: No hallucinations, no delusions   Suicidal Thoughts:  No denies suicidal or self-injurious ideations  Homicidal Thoughts:  No  Memory:  Recent and remote grossly intact  Judgement:  Other:  Present  Insight:  Present  Psychomotor Activity:  NA  Concentration:  Concentration: Good and Attention Span: Good  Recall:  Good  Fund of Knowledge: Good  Language: Good  Akathisia:  Negative  Handed:  Right  AIMS (if indicated):   Assets:  Communication Skills Desire for Improvement Resilience  ADL's:  Intact  Cognition: WNL  Sleep:  Good   Screenings:   Assessment and Plan:   65 year old female with a history of depression/anxiety.     Sharon Cole reports overall improvement of mood and anxiety symptoms.  She is currently functioning well in her daily activities, enjoying working on her art, reports stable relationship with her fianc, and states that she feels she is handling the challenges of her relationship with her son( who often asks her for money, blames her for his problems) better, without feeling guilty.  Tolerating medications well, does state that she has cut Abilify down to 2.5 mg daily for augmentation without any worsening symptoms.  Continue individual psychotherapy Will see in about 8  weeks , agrees to contact clinic sooner if any worsening prior. Continue current medication regimen. (  Buspar 30 mgrs BID, Lexapro 20 mgrs QDAY, Abilify (which she is now taking at 2.5 mg/ half of a 5 mg tablet ) QDAY )      Jenne Campus, MD 07/10/2020, 2:25 PM

## 2020-07-18 ENCOUNTER — Ambulatory Visit (INDEPENDENT_AMBULATORY_CARE_PROVIDER_SITE_OTHER): Payer: BLUE CROSS/BLUE SHIELD | Admitting: Psychology

## 2020-07-18 DIAGNOSIS — F332 Major depressive disorder, recurrent severe without psychotic features: Secondary | ICD-10-CM | POA: Diagnosis not present

## 2020-07-25 ENCOUNTER — Ambulatory Visit (INDEPENDENT_AMBULATORY_CARE_PROVIDER_SITE_OTHER): Payer: BLUE CROSS/BLUE SHIELD | Admitting: Psychology

## 2020-07-25 DIAGNOSIS — F332 Major depressive disorder, recurrent severe without psychotic features: Secondary | ICD-10-CM | POA: Diagnosis not present

## 2020-07-31 ENCOUNTER — Other Ambulatory Visit (HOSPITAL_COMMUNITY): Payer: Self-pay | Admitting: Psychiatry

## 2020-07-31 ENCOUNTER — Ambulatory Visit (INDEPENDENT_AMBULATORY_CARE_PROVIDER_SITE_OTHER): Payer: BLUE CROSS/BLUE SHIELD | Admitting: Psychology

## 2020-07-31 DIAGNOSIS — F332 Major depressive disorder, recurrent severe without psychotic features: Secondary | ICD-10-CM

## 2020-08-01 ENCOUNTER — Ambulatory Visit: Payer: Self-pay | Admitting: Psychology

## 2020-08-15 ENCOUNTER — Ambulatory Visit (INDEPENDENT_AMBULATORY_CARE_PROVIDER_SITE_OTHER): Payer: BLUE CROSS/BLUE SHIELD | Admitting: Psychology

## 2020-08-15 DIAGNOSIS — F332 Major depressive disorder, recurrent severe without psychotic features: Secondary | ICD-10-CM | POA: Diagnosis not present

## 2020-08-22 ENCOUNTER — Ambulatory Visit (INDEPENDENT_AMBULATORY_CARE_PROVIDER_SITE_OTHER): Payer: BLUE CROSS/BLUE SHIELD | Admitting: Psychology

## 2020-08-22 DIAGNOSIS — F332 Major depressive disorder, recurrent severe without psychotic features: Secondary | ICD-10-CM | POA: Diagnosis not present

## 2020-08-29 ENCOUNTER — Ambulatory Visit (INDEPENDENT_AMBULATORY_CARE_PROVIDER_SITE_OTHER): Payer: BLUE CROSS/BLUE SHIELD | Admitting: Psychology

## 2020-08-29 DIAGNOSIS — F332 Major depressive disorder, recurrent severe without psychotic features: Secondary | ICD-10-CM | POA: Diagnosis not present

## 2020-09-05 ENCOUNTER — Ambulatory Visit (INDEPENDENT_AMBULATORY_CARE_PROVIDER_SITE_OTHER): Payer: BLUE CROSS/BLUE SHIELD | Admitting: Psychology

## 2020-09-05 DIAGNOSIS — F332 Major depressive disorder, recurrent severe without psychotic features: Secondary | ICD-10-CM

## 2020-09-12 ENCOUNTER — Ambulatory Visit (INDEPENDENT_AMBULATORY_CARE_PROVIDER_SITE_OTHER): Payer: BLUE CROSS/BLUE SHIELD | Admitting: Psychology

## 2020-09-12 DIAGNOSIS — F332 Major depressive disorder, recurrent severe without psychotic features: Secondary | ICD-10-CM

## 2020-09-18 ENCOUNTER — Encounter (HOSPITAL_COMMUNITY): Payer: Self-pay | Admitting: Psychiatry

## 2020-09-18 ENCOUNTER — Other Ambulatory Visit: Payer: Self-pay

## 2020-09-18 ENCOUNTER — Telehealth (INDEPENDENT_AMBULATORY_CARE_PROVIDER_SITE_OTHER): Payer: BLUE CROSS/BLUE SHIELD | Admitting: Psychiatry

## 2020-09-18 DIAGNOSIS — F411 Generalized anxiety disorder: Secondary | ICD-10-CM | POA: Diagnosis not present

## 2020-09-18 DIAGNOSIS — F3342 Major depressive disorder, recurrent, in full remission: Secondary | ICD-10-CM | POA: Diagnosis not present

## 2020-09-18 DIAGNOSIS — F331 Major depressive disorder, recurrent, moderate: Secondary | ICD-10-CM | POA: Diagnosis not present

## 2020-09-18 MED ORDER — ESCITALOPRAM OXALATE 20 MG PO TABS: 20.0000 mg | ORAL_TABLET | Freq: Every day | ORAL | 1 refills | Status: DC

## 2020-09-18 MED ORDER — BUSPIRONE HCL 30 MG PO TABS: 30.0000 mg | ORAL_TABLET | Freq: Two times a day (BID) | ORAL | 1 refills | Status: DC

## 2020-09-18 MED ORDER — ARIPIPRAZOLE 5 MG PO TABS: 5.0000 mg | ORAL_TABLET | Freq: Every day | ORAL | 1 refills | Status: DC

## 2020-09-18 NOTE — Progress Notes (Signed)
[place] MD/PA/NP OP Progress Note  09/18/2020 5:20 PM Sharon Cole  MRN:  [id number]  Chief Complaint: Medication management appointment HPI: This appointment was conducted via phone - Patient's identity verified.   Limitations associated with this type of communication have been reviewed.   Location of parties- Patient-Home MD-behavioral services outpatient clinic Duration 63 minutes  65 year old female, lives with fianc, history of anxiety/depression.  Has been diagnosed with GAD and MDD in the past .   Currently she reports doing well.  States she is functioning well in daily activities. She states she has decided to terminate relationship with her fianc.  Their relationship is cordial but she feels that he is too focused on himself rather than on relationship and family.  She states she had been ambivalent about making this decision but now that she has mated she feels is the right decision for her and states "it has been a relief for me, I feel empowered".  States she may need to move out (they are currently living together) expresses optimism that she will be able to find an appropriate place to live.  She also states she has been able to set boundaries with her adult son, who had wanted to move in with her  In general, she reports mood has improved and currently is not endorsing significant residual or ongoing depression or anxiety in spite of above stressors.  She appears euthymic and presents with a full range of affect.  Remains future oriented, no SI.  Denies side effects or drug interactions.  Feels that current medication regimen has been effective and helpful     Visit Diagnosis: MDD by history Past Psychiatric History:   Past Medical History:  Past Medical History:  Diagnosis Date  . Anxiety   . Bradycardia   . Common migraine with intractable migraine 07/03/2016  . Depression   . Dizziness   . Headache   . Menopause   . Syncope     Past Surgical History:   Procedure Laterality Date  . BACK SURGERY     cyst removal   . BREAST SURGERY     breast reduction  . BUNIONECTOMY    . CHOLECYSTECTOMY    . KNEE ARTHROSCOPY      Family Psychiatric History:   Family History:  Family History  Problem Relation Age of Onset  . Heart disease Mother   . Heart disease Brother   . Heart disease Maternal Grandmother   . Heart disease Maternal Grandfather   . Cancer Son        unknown    Social History:  Social History   Socioeconomic History  . Marital status: Significant Other    Spouse name: Not on file  . Number of children: 2  . Years of education: Masters  . Highest education level: Not on file  Occupational History  . Not on file  Tobacco Use  . Smoking status: Never Smoker  . Smokeless tobacco: Never Used  Vaping Use  . Vaping Use: Never used  Substance and Sexual Activity  . Alcohol use: Yes    Alcohol/week: 1.0 standard drink    Types: 1 Glasses of wine per week    Comment: daily  . Drug use: No  . Sexual activity: Yes    Partners: Male  Other Topics Concern  . Not on file  Social History Narrative   Lives   Caffeine use:    Drinks 16oz caffeine drinks a day    Social Determinants of  Health   Financial Resource Strain: Not on file  Food Insecurity: Not on file  Transportation Needs: Not on file  Physical Activity: Not on file  Stress: Not on file  Social Connections: Not on file    Allergies: No Known Allergies  Metabolic Disorder Labs: No results found for: HGBA1C, MPG No results found for: PROLACTIN Lab Results  Component Value Date   CHOL 168 01/02/2015   TRIG 91.0 01/02/2015   HDL 53.70 01/02/2015   CHOLHDL 3 01/02/2015   VLDL 18.2 01/02/2015   LDLCALC 96 01/02/2015   LDLCALC 98 08/10/2013   Lab Results  Component Value Date   TSH 1.04 01/02/2015   TSH 2.986 08/10/2013    Therapeutic Level Labs: No results found for: LITHIUM No results found for: VALPROATE No components found for:   CBMZ  Current Medications: Current Outpatient Medications  Medication Sig Dispense Refill  . ARIPiprazole (ABILIFY) 5 MG tablet Take 1 tablet (5 mg total) by mouth daily. 15 tablet 0  . busPIRone (BUSPAR) 30 MG tablet Take 1 tablet (30 mg total) by mouth 2 (two) times daily. 60 tablet 1  . calcium-vitamin D (OSCAL WITH D) 250-125 MG-UNIT tablet Take 1 tablet by mouth daily.    Marland Kitchen escitalopram (LEXAPRO) 20 MG tablet Take 1 tablet (20 mg total) by mouth daily. 30 tablet 1  . Multiple Vitamins-Minerals (MULTIVITAMIN PO) Take 1 tablet by mouth daily.    Marland Kitchen omeprazole (PRILOSEC) 10 MG capsule Take 10 mg by mouth daily.    . Rimegepant Sulfate (NURTEC) 75 MG TBDP Take 75 mg by mouth as needed (take 1 at onset of headache, max is 1 tablet in 24 hours). 8 tablet 11  . topiramate (TOPAMAX) 50 MG tablet Take 3 tablets (150 mg total) by mouth at bedtime. 270 tablet 3   No current facility-administered medications for this visit.       Psychiatric Specialty Exam: Please take into account limitations in obtaining a full mental status exam in the context of this mode of communication Review of Systems does not endorse  There were no vitals taken for this visit.There is no height or weight on file to calculate BMI.  General Appearance: NA  Eye Contact:  NA  Speech:  Normal Rate  Volume:  Normal  Mood: Reports mood "OK", and presents euthymic   Affect: appears  fully reactive  Thought Process:  Linear and Descriptions of Associations: Intact  Orientation:  Full (Time, Place, and Person)  Thought Content: No hallucinations, no delusions   Suicidal Thoughts:  No denies suicidal or self-injurious ideations  Homicidal Thoughts:  No  Memory:  Recent and remote grossly intact  Judgement:  Other:  Present  Insight:  Present  Psychomotor Activity:  NA  Concentration:  Concentration: Good and Attention Span: Good  Recall:  Good  Fund of Knowledge: Good  Language: Good  Akathisia:  Negative  Handed:   Right  AIMS (if indicated):   Assets:  Communication Skills Desire for Improvement Resilience  ADL's:  Intact  Cognition: WNL  Sleep:  Good   Screenings:   Assessment and Plan:   65 year old female with a history of depression/anxiety.    Reports she is doing well , and presents euthymic with a full range of affect  Denies significant neuro-vegetative symptoms at this time and is future oriented / no SI.  She reports she has made decision to terminate relationship with fiance, and may need to to find a new place to live .  She reports she feels this decision has been helpful and states it has helped her feel better .  Denies medication side effects  Continue individual psychotherapy Will see in about 8  weeks , agrees to contact clinic sooner if any worsening or medication concerns prior. Continue current medication regimen. (Buspar 30 mgrs BID, Lexapro 20 mgrs QDAY, Abilify (which she is now taking at 2.5 mg/ half of a 5 mg tablet ) QDAY )      Jenne Campus, MD 09/18/2020, 5:20 PM

## 2020-09-19 ENCOUNTER — Ambulatory Visit (INDEPENDENT_AMBULATORY_CARE_PROVIDER_SITE_OTHER): Payer: BLUE CROSS/BLUE SHIELD | Admitting: Psychology

## 2020-09-19 DIAGNOSIS — F332 Major depressive disorder, recurrent severe without psychotic features: Secondary | ICD-10-CM

## 2020-09-26 ENCOUNTER — Ambulatory Visit (INDEPENDENT_AMBULATORY_CARE_PROVIDER_SITE_OTHER): Payer: BLUE CROSS/BLUE SHIELD | Admitting: Psychology

## 2020-09-26 DIAGNOSIS — F332 Major depressive disorder, recurrent severe without psychotic features: Secondary | ICD-10-CM

## 2020-10-03 ENCOUNTER — Ambulatory Visit (INDEPENDENT_AMBULATORY_CARE_PROVIDER_SITE_OTHER): Payer: BLUE CROSS/BLUE SHIELD | Admitting: Psychology

## 2020-10-03 DIAGNOSIS — F332 Major depressive disorder, recurrent severe without psychotic features: Secondary | ICD-10-CM

## 2020-10-10 ENCOUNTER — Ambulatory Visit: Payer: BLUE CROSS/BLUE SHIELD | Admitting: Psychology

## 2020-10-17 ENCOUNTER — Ambulatory Visit (INDEPENDENT_AMBULATORY_CARE_PROVIDER_SITE_OTHER): Payer: BLUE CROSS/BLUE SHIELD | Admitting: Psychology

## 2020-10-17 DIAGNOSIS — F332 Major depressive disorder, recurrent severe without psychotic features: Secondary | ICD-10-CM

## 2020-10-24 ENCOUNTER — Ambulatory Visit (INDEPENDENT_AMBULATORY_CARE_PROVIDER_SITE_OTHER): Payer: BLUE CROSS/BLUE SHIELD | Admitting: Psychology

## 2020-10-24 DIAGNOSIS — F332 Major depressive disorder, recurrent severe without psychotic features: Secondary | ICD-10-CM | POA: Diagnosis not present

## 2020-10-31 ENCOUNTER — Ambulatory Visit (INDEPENDENT_AMBULATORY_CARE_PROVIDER_SITE_OTHER): Payer: BLUE CROSS/BLUE SHIELD | Admitting: Psychology

## 2020-10-31 DIAGNOSIS — F332 Major depressive disorder, recurrent severe without psychotic features: Secondary | ICD-10-CM | POA: Diagnosis not present

## 2020-11-12 ENCOUNTER — Other Ambulatory Visit (HOSPITAL_COMMUNITY): Payer: Self-pay | Admitting: Psychiatry

## 2020-11-13 ENCOUNTER — Telehealth (INDEPENDENT_AMBULATORY_CARE_PROVIDER_SITE_OTHER): Payer: BLUE CROSS/BLUE SHIELD | Admitting: Psychiatry

## 2020-11-13 ENCOUNTER — Encounter (HOSPITAL_COMMUNITY): Payer: Self-pay | Admitting: Psychiatry

## 2020-11-13 ENCOUNTER — Other Ambulatory Visit: Payer: Self-pay

## 2020-11-13 DIAGNOSIS — F411 Generalized anxiety disorder: Secondary | ICD-10-CM | POA: Diagnosis not present

## 2020-11-13 DIAGNOSIS — F3342 Major depressive disorder, recurrent, in full remission: Secondary | ICD-10-CM

## 2020-11-13 DIAGNOSIS — F331 Major depressive disorder, recurrent, moderate: Secondary | ICD-10-CM | POA: Diagnosis not present

## 2020-11-13 MED ORDER — ARIPIPRAZOLE 5 MG PO TABS: 5.0000 mg | ORAL_TABLET | Freq: Every day | ORAL | 1 refills | Status: DC

## 2020-11-13 MED ORDER — BUSPIRONE HCL 30 MG PO TABS: 30.0000 mg | ORAL_TABLET | Freq: Two times a day (BID) | ORAL | 1 refills | Status: DC

## 2020-11-13 MED ORDER — ESCITALOPRAM OXALATE 20 MG PO TABS: 20.0000 mg | ORAL_TABLET | Freq: Every day | ORAL | 1 refills | Status: DC

## 2020-11-13 NOTE — Progress Notes (Signed)
BH MD/PA/NP OP Progress Note  11/13/2020 4:08 PM Sharon Cole  MRN:  [id number]  Chief Complaint: Medication management appointment HPI: This appointment was conducted via phone - Patient's identity verified.   Limitations associated with this type of communication have been reviewed.   Location of parties- Patient-Home MD-behavioral services outpatient clinic Duration 38 minutes  65 year old female, lives with fianc, history of anxiety/depression.  Has been diagnosed with GAD and MDD in the past .   Currently reports she is doing well.  Mood has been stable and generally euthymic.  She denies significant neurovegetative symptoms or anhedonia.  In fact, states she feels better and better able to do the things she wants to do.  Denies suicidal ideations and presents future oriented.  She does endorse some anxiety and a vague sense of apprehension with regards to upcoming events.  In particular she has decided to end the relationship with her fianc.  Their relationship has become somewhat colder and more distant but they are still living together.  Later this week she plans to inform him that she is moving out which he does not know yet.  She states she has an apartment that her brother is helping her purchase.  She is excited about moving out and living alone/independently again and states she feels she is making the right decision for herself . Another chronic stressor pertains to her adult son who often requests money from her and from her ex-husband and has described becomes irritated or makes her feel guilty if she does not given money.  She states that her ex-husband and her have arranged to meet with son and clearly set boundaries/limits.  She states she thinks her son will criticize his decision but states that she feels this is the right decision for herself and has realized that she cannot control his behavior/decisions but can control hers. Denies medication side effects and states  that current medication regimen has been going well and is effective.     Visit Diagnosis: MDD by history Past Psychiatric History:   Past Medical History:  Past Medical History:  Diagnosis Date   Anxiety    Bradycardia    Common migraine with intractable migraine 07/03/2016   Depression    Dizziness    Headache    Menopause    Syncope     Past Surgical History:  Procedure Laterality Date   BACK SURGERY     cyst removal    BREAST SURGERY     breast reduction   BUNIONECTOMY     CHOLECYSTECTOMY     KNEE ARTHROSCOPY      Family Psychiatric History:   Family History:  Family History  Problem Relation Age of Onset   Heart disease Mother    Heart disease Brother    Heart disease Maternal Grandmother    Heart disease Maternal Grandfather    Cancer Son        unknown    Social History:  Social History   Socioeconomic History   Marital status: Significant Other    Spouse name: Not on file   Number of children: 2   Years of education: Masters   Highest education level: Not on file  Occupational History   Not on file  Tobacco Use   Smoking status: Never   Smokeless tobacco: Never  Vaping Use   Vaping Use: Never used  Substance and Sexual Activity   Alcohol use: Yes    Alcohol/week: 1.0 standard drink    Types:  1 Glasses of wine per week    Comment: daily   Drug use: No   Sexual activity: Yes    Partners: Male  Other Topics Concern   Not on file  Social History Narrative   Lives   Caffeine use:    Drinks 16oz caffeine drinks a day    Social Determinants of Health   Financial Resource Strain: Not on file  Food Insecurity: Not on file  Transportation Needs: Not on file  Physical Activity: Not on file  Stress: Not on file  Social Connections: Not on file    Allergies: No Known Allergies  Metabolic Disorder Labs: No results found for: HGBA1C, MPG No results found for: PROLACTIN Lab Results  Component Value Date   CHOL 168 01/02/2015   TRIG  91.0 01/02/2015   HDL 53.70 01/02/2015   CHOLHDL 3 01/02/2015   VLDL 18.2 01/02/2015   LDLCALC 96 01/02/2015   LDLCALC 98 08/10/2013   Lab Results  Component Value Date   TSH 1.04 01/02/2015   TSH 2.986 08/10/2013    Therapeutic Level Labs: No results found for: LITHIUM No results found for: VALPROATE No components found for:  CBMZ  Current Medications: Current Outpatient Medications  Medication Sig Dispense Refill   ARIPiprazole (ABILIFY) 5 MG tablet Take 1 tablet (5 mg total) by mouth daily. 30 tablet 1   busPIRone (BUSPAR) 30 MG tablet Take 1 tablet (30 mg total) by mouth 2 (two) times daily. 60 tablet 1   calcium-vitamin D (OSCAL WITH D) 250-125 MG-UNIT tablet Take 1 tablet by mouth daily.     escitalopram (LEXAPRO) 20 MG tablet Take 1 tablet (20 mg total) by mouth daily. 30 tablet 1   Multiple Vitamins-Minerals (MULTIVITAMIN PO) Take 1 tablet by mouth daily.     omeprazole (PRILOSEC) 10 MG capsule Take 10 mg by mouth daily.     Rimegepant Sulfate (NURTEC) 75 MG TBDP Take 75 mg by mouth as needed (take 1 at onset of headache, max is 1 tablet in 24 hours). 8 tablet 11   topiramate (TOPAMAX) 50 MG tablet Take 3 tablets (150 mg total) by mouth at bedtime. 270 tablet 3   No current facility-administered medications for this visit.       Psychiatric Specialty Exam: Please take into account limitations in obtaining a full mental status exam in the context of this mode of communication Review of Systems does not endorse  There were no vitals taken for this visit.There is no height or weight on file to calculate BMI.  General Appearance: NA  Eye Contact:  NA  Speech:  Normal Rate  Volume:  Normal  Mood: Reports mood "OK", and presents euthymic   Affect: appears  fully reactive  Thought Process:  Linear and Descriptions of Associations: Intact  Orientation:  Full (Time, Place, and Person)  Thought Content:  No hallucinations, no delusions    Suicidal Thoughts:  No denies  suicidal or self-injurious ideations  Homicidal Thoughts:  No  Memory:   Recent and remote grossly intact  Judgement:  Other:  Present  Insight:  Present  Psychomotor Activity:  NA  Concentration:  Concentration: Good and Attention Span: Good  Recall:  Good  Fund of Knowledge: Good  Language: Good  Akathisia:  Negative  Handed:  Right  AIMS (if indicated):   Assets:  Communication Skills Desire for Improvement Resilience  ADL's:  Intact  Cognition: WNL  Sleep:  Good   Screenings:   Assessment and Plan:  65 year old female with a history of depression/anxiety.    Currently patient reports she is doing well.  She presents euthymic without significant neurovegetative symptoms.  She is tolerating current medication regimen well without side effects.  No SI.  Future oriented.  She reports she has decided to end the relationship with her fianc and is going to tell him later this week that she is moving out.  She expresses a sense of optimism and excitement about living independently in her own apartment.  She also states she has a upcoming meeting with her adult son and her ex-husband (her son's father ) to establish boundaries regarding his financial dependence on them. Overall she states she feels optimistic that these decisions/meetings will go well and generally describes mood is improved and coping skills as improved as well.  Continue individual psychotherapy Will see in about 4-6  weeks , agrees to contact clinic sooner if any worsening or medication concerns prior. Continue current medication regimen.  Side effects reviewed, to include potential risk of drug drug interaction/serotonin syndrome (Buspar 30 mgrs BID, Lexapro 20 mgrs QDAY, Abilify (which she is now taking at 2.5 mg/ half of a 5 mg tablet ) QDAY )      Jenne Campus, MD 11/13/2020, 4:08 PM

## 2020-11-14 ENCOUNTER — Ambulatory Visit (INDEPENDENT_AMBULATORY_CARE_PROVIDER_SITE_OTHER): Payer: BLUE CROSS/BLUE SHIELD | Admitting: Psychology

## 2020-11-14 DIAGNOSIS — F332 Major depressive disorder, recurrent severe without psychotic features: Secondary | ICD-10-CM | POA: Diagnosis not present

## 2020-11-21 ENCOUNTER — Ambulatory Visit (INDEPENDENT_AMBULATORY_CARE_PROVIDER_SITE_OTHER): Payer: BLUE CROSS/BLUE SHIELD | Admitting: Psychology

## 2020-11-21 DIAGNOSIS — F332 Major depressive disorder, recurrent severe without psychotic features: Secondary | ICD-10-CM

## 2020-11-28 ENCOUNTER — Ambulatory Visit (INDEPENDENT_AMBULATORY_CARE_PROVIDER_SITE_OTHER): Payer: BLUE CROSS/BLUE SHIELD | Admitting: Psychology

## 2020-11-28 DIAGNOSIS — F332 Major depressive disorder, recurrent severe without psychotic features: Secondary | ICD-10-CM | POA: Diagnosis not present

## 2020-12-05 ENCOUNTER — Ambulatory Visit (INDEPENDENT_AMBULATORY_CARE_PROVIDER_SITE_OTHER): Payer: BLUE CROSS/BLUE SHIELD | Admitting: Psychology

## 2020-12-05 DIAGNOSIS — F332 Major depressive disorder, recurrent severe without psychotic features: Secondary | ICD-10-CM | POA: Diagnosis not present

## 2020-12-12 ENCOUNTER — Ambulatory Visit (INDEPENDENT_AMBULATORY_CARE_PROVIDER_SITE_OTHER): Payer: BLUE CROSS/BLUE SHIELD | Admitting: Psychology

## 2020-12-12 DIAGNOSIS — F332 Major depressive disorder, recurrent severe without psychotic features: Secondary | ICD-10-CM

## 2020-12-19 ENCOUNTER — Ambulatory Visit: Payer: BLUE CROSS/BLUE SHIELD | Admitting: Psychology

## 2020-12-26 ENCOUNTER — Ambulatory Visit: Payer: BLUE CROSS/BLUE SHIELD | Admitting: Psychology

## 2021-01-01 ENCOUNTER — Ambulatory Visit (INDEPENDENT_AMBULATORY_CARE_PROVIDER_SITE_OTHER): Payer: BLUE CROSS/BLUE SHIELD | Admitting: Psychology

## 2021-01-01 DIAGNOSIS — F332 Major depressive disorder, recurrent severe without psychotic features: Secondary | ICD-10-CM

## 2021-01-02 ENCOUNTER — Ambulatory Visit: Payer: BLUE CROSS/BLUE SHIELD | Admitting: Psychology

## 2021-01-09 ENCOUNTER — Ambulatory Visit (INDEPENDENT_AMBULATORY_CARE_PROVIDER_SITE_OTHER): Payer: BLUE CROSS/BLUE SHIELD | Admitting: Psychology

## 2021-01-09 DIAGNOSIS — F332 Major depressive disorder, recurrent severe without psychotic features: Secondary | ICD-10-CM | POA: Diagnosis not present

## 2021-01-16 ENCOUNTER — Ambulatory Visit (INDEPENDENT_AMBULATORY_CARE_PROVIDER_SITE_OTHER): Payer: BLUE CROSS/BLUE SHIELD | Admitting: Psychology

## 2021-01-16 DIAGNOSIS — F332 Major depressive disorder, recurrent severe without psychotic features: Secondary | ICD-10-CM | POA: Diagnosis not present

## 2021-01-23 ENCOUNTER — Ambulatory Visit (INDEPENDENT_AMBULATORY_CARE_PROVIDER_SITE_OTHER): Payer: BLUE CROSS/BLUE SHIELD | Admitting: Psychology

## 2021-01-23 DIAGNOSIS — F332 Major depressive disorder, recurrent severe without psychotic features: Secondary | ICD-10-CM | POA: Diagnosis not present

## 2021-01-24 ENCOUNTER — Ambulatory Visit: Payer: BLUE CROSS/BLUE SHIELD | Admitting: Neurology

## 2021-01-30 ENCOUNTER — Other Ambulatory Visit (HOSPITAL_COMMUNITY): Payer: Self-pay | Admitting: *Deleted

## 2021-01-30 ENCOUNTER — Ambulatory Visit (INDEPENDENT_AMBULATORY_CARE_PROVIDER_SITE_OTHER): Payer: BLUE CROSS/BLUE SHIELD | Admitting: Psychology

## 2021-01-30 DIAGNOSIS — F332 Major depressive disorder, recurrent severe without psychotic features: Secondary | ICD-10-CM

## 2021-01-30 DIAGNOSIS — F411 Generalized anxiety disorder: Secondary | ICD-10-CM

## 2021-01-30 DIAGNOSIS — F331 Major depressive disorder, recurrent, moderate: Secondary | ICD-10-CM

## 2021-01-30 MED ORDER — BUSPIRONE HCL 30 MG PO TABS: 30.0000 mg | ORAL_TABLET | Freq: Two times a day (BID) | ORAL | 0 refills | Status: DC

## 2021-01-31 ENCOUNTER — Ambulatory Visit: Payer: BLUE CROSS/BLUE SHIELD | Admitting: Neurology

## 2021-02-05 ENCOUNTER — Encounter (HOSPITAL_COMMUNITY): Payer: Self-pay | Admitting: Psychiatry

## 2021-02-05 ENCOUNTER — Telehealth (HOSPITAL_BASED_OUTPATIENT_CLINIC_OR_DEPARTMENT_OTHER): Payer: BLUE CROSS/BLUE SHIELD | Admitting: Psychiatry

## 2021-02-05 ENCOUNTER — Other Ambulatory Visit: Payer: Self-pay

## 2021-02-05 DIAGNOSIS — F411 Generalized anxiety disorder: Secondary | ICD-10-CM

## 2021-02-05 DIAGNOSIS — F324 Major depressive disorder, single episode, in partial remission: Secondary | ICD-10-CM

## 2021-02-05 DIAGNOSIS — F331 Major depressive disorder, recurrent, moderate: Secondary | ICD-10-CM | POA: Diagnosis not present

## 2021-02-05 MED ORDER — ARIPIPRAZOLE 5 MG PO TABS: 5.0000 mg | ORAL_TABLET | Freq: Every day | ORAL | 1 refills | Status: DC

## 2021-02-05 MED ORDER — BUSPIRONE HCL 30 MG PO TABS: 30.0000 mg | ORAL_TABLET | Freq: Two times a day (BID) | ORAL | 1 refills | Status: DC

## 2021-02-05 MED ORDER — ESCITALOPRAM OXALATE 20 MG PO TABS: 20.0000 mg | ORAL_TABLET | Freq: Every day | ORAL | 1 refills | Status: DC

## 2021-02-05 NOTE — Progress Notes (Addendum)
[facility] MD/PA/NP OP Progress Note  Sharon Cole  MRN:  [id number] 02/05/2021   Chief Complaint: Medication management appointment HPI: This appointment was conducted via phone - Patient's identity verified.   Limitations associated with this type of communication have been reviewed.   Location of parties- Patient-Home MD-behavioral services outpatient clinic Duration 25 minutes  65 year old female, lives with fianc, history of anxiety/depression.  Has been diagnosed with GAD and MDD in the past .   Patient reports she has has been struggling with some increased  depression.  She recently terminated her relationship with her fianc and moved out independently.  She states that this was a good move for her and she is appreciating her independence and living alone but at the same time she is facing some financial difficulties which she did not have before.  She had a recent job which she states was excessively stressful.  At this time she is unemployed, looking for another job. She denies suicidal ideations.  She reports has probably been eating more and has gained some weight which she states has been partly due to anxiety.  Energy level /sleep have remained about the same.  She denies having any psychotic symptoms and none are noted. She states her medications continue to work well, denies side effects. She responds well to support/encouragement, review of coping skills.  She states she has been through difficult times in her life before and she is confident that she will be able to navigate these current financial difficulties successfully.     Visit Diagnosis: MDD by history Past Psychiatric History:   Past Medical History:  Past Medical History:  Diagnosis Date   Anxiety    Bradycardia    Common migraine with intractable migraine 07/03/2016   Depression    Dizziness    Headache    Menopause    Syncope     Past Surgical History:  Procedure Laterality Date   BACK SURGERY      cyst removal    BREAST SURGERY     breast reduction   BUNIONECTOMY     CHOLECYSTECTOMY     KNEE ARTHROSCOPY      Family Psychiatric History:   Family History:  Family History  Problem Relation Age of Onset   Heart disease Mother    Heart disease Brother    Heart disease Maternal Grandmother    Heart disease Maternal Grandfather    Cancer Son        unknown    Social History:  Social History   Socioeconomic History   Marital status: Significant Other    Spouse name: Not on file   Number of children: 2   Years of education: Masters   Highest education level: Not on file  Occupational History   Not on file  Tobacco Use   Smoking status: Never   Smokeless tobacco: Never  Vaping Use   Vaping Use: Never used  Substance and Sexual Activity   Alcohol use: Yes    Alcohol/week: 1.0 standard drink    Types: 1 Glasses of wine per week    Comment: daily   Drug use: No   Sexual activity: Yes    Partners: Male  Other Topics Concern   Not on file  Social History Narrative   Lives   Caffeine use:    Drinks 16oz caffeine drinks a day    Social Determinants of Radio broadcast assistant Strain: Not on file  Food Insecurity: Not on file  Transportation  Needs: Not on file  Physical Activity: Not on file  Stress: Not on file  Social Connections: Not on file    Allergies: No Known Allergies  Metabolic Disorder Labs: No results found for: HGBA1C, MPG No results found for: PROLACTIN Lab Results  Component Value Date   CHOL 168 01/02/2015   TRIG 91.0 01/02/2015   HDL 53.70 01/02/2015   CHOLHDL 3 01/02/2015   VLDL 18.2 01/02/2015   LDLCALC 96 01/02/2015   LDLCALC 98 08/10/2013   Lab Results  Component Value Date   TSH 1.04 01/02/2015   TSH 2.986 08/10/2013    Therapeutic Level Labs: No results found for: LITHIUM No results found for: VALPROATE No components found for:  CBMZ  Current Medications: Current Outpatient Medications  Medication Sig Dispense  Refill   ARIPiprazole (ABILIFY) 5 MG tablet Take 1 tablet (5 mg total) by mouth daily. 30 tablet 1   busPIRone (BUSPAR) 30 MG tablet Take 1 tablet (30 mg total) by mouth 2 (two) times daily. 60 tablet 1   calcium-vitamin D (OSCAL WITH D) 250-125 MG-UNIT tablet Take 1 tablet by mouth daily.     escitalopram (LEXAPRO) 20 MG tablet Take 1 tablet (20 mg total) by mouth daily. 30 tablet 1   Multiple Vitamins-Minerals (MULTIVITAMIN PO) Take 1 tablet by mouth daily.     omeprazole (PRILOSEC) 10 MG capsule Take 10 mg by mouth daily.     Rimegepant Sulfate (NURTEC) 75 MG TBDP Take 75 mg by mouth as needed (take 1 at onset of headache, max is 1 tablet in 24 hours). 8 tablet 11   topiramate (TOPAMAX) 50 MG tablet Take 3 tablets (150 mg total) by mouth at bedtime. 270 tablet 3   No current facility-administered medications for this visit.       Psychiatric Specialty Exam: Please take into account limitations in obtaining a full mental status exam in the context of this mode of communication Review of Systems does not endorse  There were no vitals taken for this visit.There is no height or weight on file to calculate BMI.  General Appearance: NA  Eye Contact:  NA  Speech:  Normal Rate  Volume:  Normal  Mood: Reports she has been experiencing some increased depression, which she attributes to financial stressors mostly  Affect: Briefly tearful when discussing stressors as above, otherwise reactive, smiles/laughs at times appropriately during session  Thought Process:  Linear and Descriptions of Associations: Intact  Orientation:  Full (Time, Place, and Person)  Thought Content:  No hallucinations, no delusions    Suicidal Thoughts:  No denies having any self-injurious or suicidal ideations   Homicidal Thoughts:  No  Memory:   Recent and remote grossly intact  Judgement:  Other:  Present  Insight:  Present  Psychomotor Activity:  NA  Concentration:  Concentration: Good and Attention Span: Good   Recall:  Good  Fund of Knowledge: Good  Language: Good  Akathisia:  Negative  Handed:  Right  AIMS (if indicated):   Assets:  Communication Skills Desire for Improvement Resilience  ADL's:  Intact  Cognition: WNL  Sleep:  Good   Screenings:   Assessment and Plan:   65 year old female with a history of depression/anxiety.    Currently reports some increased depression related to financial difficulties.  She recently terminated relationship with her fianc and has moved out independently which has been a contributor .  She is currently unemployed, looking for a job.  Overall, however she reports feeling optimistic and hopeful  denies suicidal ideations.  Tolerating medications well.  No side effects reported.  We reviewed treatment options , he is interested in increasing Abilify to 5 mg daily as an antidepressant augmentation. She continues to see her therapist for individual psychotherapy regularly. Continue individual psychotherapy Will see in about 4-6  weeks , agrees to contact clinic sooner if any worsening or medication concerns prior. Continue current medication regimen.  (Buspar 30 mgrs BID, Lexapro 20 mgrs QDAY, Abilify (5 mg )QDAY       F Evalena Fujii MD

## 2021-02-06 ENCOUNTER — Ambulatory Visit (INDEPENDENT_AMBULATORY_CARE_PROVIDER_SITE_OTHER): Payer: BLUE CROSS/BLUE SHIELD | Admitting: Psychology

## 2021-02-06 DIAGNOSIS — F332 Major depressive disorder, recurrent severe without psychotic features: Secondary | ICD-10-CM | POA: Diagnosis not present

## 2021-02-13 ENCOUNTER — Ambulatory Visit (INDEPENDENT_AMBULATORY_CARE_PROVIDER_SITE_OTHER): Payer: BLUE CROSS/BLUE SHIELD | Admitting: Psychology

## 2021-02-13 DIAGNOSIS — F332 Major depressive disorder, recurrent severe without psychotic features: Secondary | ICD-10-CM | POA: Diagnosis not present

## 2021-02-20 ENCOUNTER — Ambulatory Visit (INDEPENDENT_AMBULATORY_CARE_PROVIDER_SITE_OTHER): Payer: BLUE CROSS/BLUE SHIELD | Admitting: Psychology

## 2021-02-20 DIAGNOSIS — F332 Major depressive disorder, recurrent severe without psychotic features: Secondary | ICD-10-CM

## 2021-02-27 ENCOUNTER — Ambulatory Visit (INDEPENDENT_AMBULATORY_CARE_PROVIDER_SITE_OTHER): Payer: BLUE CROSS/BLUE SHIELD | Admitting: Psychology

## 2021-02-27 DIAGNOSIS — F332 Major depressive disorder, recurrent severe without psychotic features: Secondary | ICD-10-CM | POA: Diagnosis not present

## 2021-03-05 ENCOUNTER — Telehealth (HOSPITAL_BASED_OUTPATIENT_CLINIC_OR_DEPARTMENT_OTHER): Payer: BLUE CROSS/BLUE SHIELD | Admitting: Psychiatry

## 2021-03-05 ENCOUNTER — Encounter (HOSPITAL_COMMUNITY): Payer: Self-pay | Admitting: Psychiatry

## 2021-03-05 ENCOUNTER — Other Ambulatory Visit: Payer: Self-pay

## 2021-03-05 DIAGNOSIS — F411 Generalized anxiety disorder: Secondary | ICD-10-CM | POA: Diagnosis not present

## 2021-03-05 DIAGNOSIS — F331 Major depressive disorder, recurrent, moderate: Secondary | ICD-10-CM

## 2021-03-05 DIAGNOSIS — F320 Major depressive disorder, single episode, mild: Secondary | ICD-10-CM | POA: Diagnosis not present

## 2021-03-05 MED ORDER — ESCITALOPRAM OXALATE 20 MG PO TABS: 20.0000 mg | ORAL_TABLET | Freq: Every day | ORAL | 1 refills | Status: DC

## 2021-03-05 MED ORDER — ARIPIPRAZOLE 5 MG PO TABS: 5.0000 mg | ORAL_TABLET | Freq: Every day | ORAL | 1 refills | Status: DC

## 2021-03-05 MED ORDER — BUSPIRONE HCL 30 MG PO TABS: 30.0000 mg | ORAL_TABLET | Freq: Two times a day (BID) | ORAL | 1 refills | Status: DC

## 2021-03-05 NOTE — Progress Notes (Addendum)
[facility] MD/PA/NP OP Progress Note  Sharon Cole  MRN:  [id number] 03/05/2021   Chief Complaint: Medication management appointment HPI: This appointment was conducted via phone - Patient's identity verified.   Limitations associated with this type of communication have been reviewed.   Location of parties- Patient-Home MD-behavioral services outpatient clinic Duration 25 minutes  65 year old female, lives with fianc, history of anxiety/depression.  Has been diagnosed with GAD and MDD in the past .   Reports she has been feeling better and is currently doing well.  As noted in previous notes she recently terminated her relationship with her fianc with whom she had been living and is now living alone.  She states she does not regret this decision although it has been difficult financially.  She states she is starting a new job later this week.  She is excited about this and states that being unemployed was contributing to a decreased sense of self-esteem.  She states that having a job is helping her feel better about herself. 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 Currently does not endorse significant neurovegetative symptoms.  Her affect appears improved and laughs appropriately during session.  She denies suicidal ideations and presents future oriented.  No psychotic symptoms are noted or endorsed. With regards to medications she states that they are doing well.  Denies side effects.  Feels that medications have been effective/helpful.     Visit Diagnosis: MDD by history Past Psychiatric History:   Past Medical History:  Past Medical History:  Diagnosis Date   Anxiety    Bradycardia    Common migraine with intractable migraine 07/03/2016   Depression    Dizziness    Headache    Menopause    Syncope     Past Surgical History:  Procedure Laterality Date   BACK SURGERY     cyst removal    BREAST  SURGERY     breast reduction   BUNIONECTOMY     CHOLECYSTECTOMY     KNEE ARTHROSCOPY      Family Psychiatric History:   Family History:  Family History  Problem Relation Age of Onset   Heart disease Mother    Heart disease Brother    Heart disease Maternal Grandmother    Heart disease Maternal Grandfather    Cancer Son        unknown    Social History:  Social History   Socioeconomic History   Marital status: Significant Other    Spouse name: Not on file   Number of children: 2   Years of education: Masters   Highest education level: Not on file  Occupational History   Not on file  Tobacco Use   Smoking status: Never   Smokeless tobacco: Never  Vaping Use   Vaping Use: Never used  Substance and Sexual Activity   Alcohol use: Yes    Alcohol/week: 1.0 standard drink    Types: 1 Glasses of wine per week    Comment: daily   Drug use: No   Sexual activity: Yes    Partners: Male  Other Topics Concern   Not on file  Social History Narrative   Lives   Caffeine use:    Drinks 16oz caffeine drinks a day    Social Determinants of Health   Financial Resource Strain: Not on file  Food Insecurity: Not on file  Transportation Needs: Not on file  Physical Activity: Not on file  Stress: Not on file  Social Connections: Not on file    Allergies: No Known Allergies  Metabolic Disorder Labs: No results found for: HGBA1C, MPG No results found for: PROLACTIN Lab Results  Component Value Date   CHOL 168 01/02/2015   TRIG 91.0 01/02/2015   HDL 53.70 01/02/2015   CHOLHDL 3 01/02/2015   VLDL 18.2 01/02/2015   LDLCALC 96 01/02/2015   LDLCALC 98 08/10/2013   Lab Results  Component Value Date   TSH 1.04 01/02/2015   TSH 2.986 08/10/2013    Therapeutic Level Labs: No results found for: LITHIUM No results found for: VALPROATE No components found for:  CBMZ  Current Medications: Current Outpatient Medications  Medication Sig Dispense Refill   ARIPiprazole  (ABILIFY) 5 MG tablet Take 1 tablet (5 mg total) by mouth daily. 30 tablet 1   busPIRone (BUSPAR) 30 MG tablet Take 1 tablet (30 mg total) by mouth 2 (two) times daily. 60 tablet 1   calcium-vitamin D (OSCAL WITH D) 250-125 MG-UNIT tablet Take 1 tablet by mouth daily.     escitalopram (LEXAPRO) 20 MG tablet Take 1 tablet (20 mg total) by mouth daily. 30 tablet 1   Multiple Vitamins-Minerals (MULTIVITAMIN PO) Take 1 tablet by mouth daily.     omeprazole (PRILOSEC) 10 MG capsule Take 10 mg by mouth daily.     Rimegepant Sulfate (NURTEC) 75 MG TBDP Take 75 mg by mouth as needed (take 1 at onset of headache, max is 1 tablet in 24 hours). 8 tablet 11   topiramate (TOPAMAX) 50 MG tablet Take 3 tablets (150 mg total) by mouth at bedtime. 270 tablet 3   No current facility-administered medications for this visit.       Psychiatric Specialty Exam: Please take into account limitations in obtaining a full mental status exam in the context of this mode of communication Review of Systems  Musculoskeletal:  Positive for arthralgias.  does not endorse  There were no vitals taken for this visit.There is no height or weight on file to calculate BMI.  General Appearance: NA  Eye Contact:  NA  Speech:  Normal Rate  Volume:  Normal  Mood: Reports she is feeling better, endorses improved mood, affect appears improved and more reactive  Affect: As above  Thought Process:  Linear and Descriptions of Associations: Intact  Orientation:  Full (Time, Place, and Person)  Thought Content:  No hallucinations, no delusions    Suicidal Thoughts:  No denies having any self-injurious or suicidal ideations   Homicidal Thoughts:  No  Memory:   Recent and remote grossly intact  Judgement:  Other:  Present  Insight:  Present  Psychomotor Activity:  NA  Concentration:  Concentration: Good and Attention Span: Good  Recall:  Good  Fund of Knowledge: Good  Language: Good  Akathisia:  Negative  Handed:  Right  AIMS (if  indicated):   Assets:  Communication Skills Desire for Improvement Resilience  ADL's:  Intact  Cognition: WNL  Sleep:  Good   Screenings:   Assessment and Plan:   65 year old female with a history of depression/anxiety.    She is currently doing well.  Reports feeling better.  She describes improved mood and presents with a fuller range of affect.  No SI and presents future oriented.  She is starting a new job later this week and is excited about this.  She also states that having a job it is helping her sense of  self-esteem and also her finances which have been a significant stressor lately. Overall states she is doing well at this time. She is tolerating current medication regimen well, denies side effects. Side effects of been reviewed.   Continue individual psychotherapy Will see in about 4-6  weeks , agrees to contact clinic sooner if any worsening or medication concerns prior. Continue current medication regimen.  (Buspar 30 mgrs BID, Lexapro 20 mgrs QDAY, Abilify (5 mg )QDAY       F Alayne Estrella MD

## 2021-03-06 ENCOUNTER — Ambulatory Visit: Payer: BLUE CROSS/BLUE SHIELD | Admitting: Psychology

## 2021-03-13 ENCOUNTER — Ambulatory Visit (INDEPENDENT_AMBULATORY_CARE_PROVIDER_SITE_OTHER): Payer: BLUE CROSS/BLUE SHIELD | Admitting: Psychology

## 2021-03-13 DIAGNOSIS — F332 Major depressive disorder, recurrent severe without psychotic features: Secondary | ICD-10-CM | POA: Diagnosis not present

## 2021-03-20 ENCOUNTER — Ambulatory Visit (INDEPENDENT_AMBULATORY_CARE_PROVIDER_SITE_OTHER): Payer: BLUE CROSS/BLUE SHIELD | Admitting: Psychology

## 2021-03-20 DIAGNOSIS — F332 Major depressive disorder, recurrent severe without psychotic features: Secondary | ICD-10-CM | POA: Diagnosis not present

## 2021-03-27 ENCOUNTER — Ambulatory Visit (INDEPENDENT_AMBULATORY_CARE_PROVIDER_SITE_OTHER): Payer: BLUE CROSS/BLUE SHIELD | Admitting: Psychology

## 2021-03-27 DIAGNOSIS — F332 Major depressive disorder, recurrent severe without psychotic features: Secondary | ICD-10-CM | POA: Diagnosis not present

## 2021-04-03 ENCOUNTER — Ambulatory Visit (INDEPENDENT_AMBULATORY_CARE_PROVIDER_SITE_OTHER): Payer: BLUE CROSS/BLUE SHIELD | Admitting: Psychology

## 2021-04-03 DIAGNOSIS — F332 Major depressive disorder, recurrent severe without psychotic features: Secondary | ICD-10-CM

## 2021-04-10 ENCOUNTER — Ambulatory Visit: Payer: BLUE CROSS/BLUE SHIELD | Admitting: Psychology

## 2021-04-16 ENCOUNTER — Encounter (HOSPITAL_COMMUNITY): Payer: Self-pay | Admitting: Psychiatry

## 2021-04-16 ENCOUNTER — Telehealth (HOSPITAL_BASED_OUTPATIENT_CLINIC_OR_DEPARTMENT_OTHER): Payer: BLUE CROSS/BLUE SHIELD | Admitting: Psychiatry

## 2021-04-16 ENCOUNTER — Other Ambulatory Visit: Payer: Self-pay

## 2021-04-16 DIAGNOSIS — F325 Major depressive disorder, single episode, in full remission: Secondary | ICD-10-CM | POA: Diagnosis not present

## 2021-04-16 DIAGNOSIS — F411 Generalized anxiety disorder: Secondary | ICD-10-CM | POA: Diagnosis not present

## 2021-04-16 DIAGNOSIS — F331 Major depressive disorder, recurrent, moderate: Secondary | ICD-10-CM

## 2021-04-16 MED ORDER — BUSPIRONE HCL 30 MG PO TABS: 30.0000 mg | ORAL_TABLET | Freq: Two times a day (BID) | ORAL | 1 refills | Status: DC

## 2021-04-16 MED ORDER — ESCITALOPRAM OXALATE 20 MG PO TABS: 20.0000 mg | ORAL_TABLET | Freq: Every day | ORAL | 1 refills | Status: DC

## 2021-04-16 MED ORDER — ARIPIPRAZOLE 5 MG PO TABS: 5.0000 mg | ORAL_TABLET | Freq: Every day | ORAL | 1 refills | Status: DC

## 2021-04-16 NOTE — Progress Notes (Signed)
[facility] MD/PA/NP OP Progress Note  Sharon Cole  MRN:  [id number] 03/05/2021   Chief Complaint: Medication management appointment HPI: This appointment was conducted via phone - Patient's identity verified.   Limitations associated with this type of communication have been reviewed.   Location of parties- Patient-Home MD-behavioral services outpatient clinic Duration 25 minutes  65 year old female, lives with fianc, history of anxiety/depression.  Has been diagnosed with GAD and MDD in the past .   Reports she has been feeling better and is currently doing well.  As noted in previous notes she recently terminated her relationship with her fianc with whom she had been living and is now living alone.  She states she does not regret this decision although it has been difficult financially.  She states she is starting a new job later this week.  She is excited about this and states that being unemployed was contributing to a decreased sense of self-esteem.  She states that having a job is helping her feel better about herself. 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 Currently does not endorse significant neurovegetative symptoms.  Her affect appears improved and laughs appropriately during session.  She denies suicidal ideations and presents future oriented.  No psychotic symptoms are noted or endorsed. With regards to medications she states that they are doing well.  Denies side effects.  Feels that medications have been effective/helpful.     Visit Diagnosis: MDD by history Past Psychiatric History:   Past Medical History:  Past Medical History:  Diagnosis Date   Anxiety    Bradycardia    Common migraine with intractable migraine 07/03/2016   Depression    Dizziness    Headache    Menopause    Syncope     Past Surgical History:  Procedure Laterality Date   BACK SURGERY     cyst removal    BREAST  SURGERY     breast reduction   BUNIONECTOMY     CHOLECYSTECTOMY     KNEE ARTHROSCOPY      Family Psychiatric History:   Family History:  Family History  Problem Relation Age of Onset   Heart disease Mother    Heart disease Brother    Heart disease Maternal Grandmother    Heart disease Maternal Grandfather    Cancer Son        unknown    Social History:  Social History   Socioeconomic History   Marital status: Significant Other    Spouse name: Not on file   Number of children: 2   Years of education: Masters   Highest education level: Not on file  Occupational History   Not on file  Tobacco Use   Smoking status: Never   Smokeless tobacco: Never  Vaping Use   Vaping Use: Never used  Substance and Sexual Activity   Alcohol use: Yes    Alcohol/week: 1.0 standard drink    Types: 1 Glasses of wine per week    Comment: daily   Drug use: No   Sexual activity: Yes    Partners: Male  Other Topics Concern   Not on file  Social History Narrative   Lives   Caffeine use:    Drinks 16oz caffeine drinks a day    Social Determinants of Health   Financial Resource Strain: Not on file  Food Insecurity: Not on file  Transportation Needs: Not on file  Physical Activity: Not on file  Stress: Not on file  Social Connections: Not on file    Allergies: No Known Allergies  Metabolic Disorder Labs: No results found for: HGBA1C, MPG No results found for: PROLACTIN Lab Results  Component Value Date   CHOL 168 01/02/2015   TRIG 91.0 01/02/2015   HDL 53.70 01/02/2015   CHOLHDL 3 01/02/2015   VLDL 18.2 01/02/2015   LDLCALC 96 01/02/2015   LDLCALC 98 08/10/2013   Lab Results  Component Value Date   TSH 1.04 01/02/2015   TSH 2.986 08/10/2013    Therapeutic Level Labs: No results found for: LITHIUM No results found for: VALPROATE No components found for:  CBMZ  Current Medications: Current Outpatient Medications  Medication Sig Dispense Refill   ARIPiprazole  (ABILIFY) 5 MG tablet Take 1 tablet (5 mg total) by mouth daily. 30 tablet 1   busPIRone (BUSPAR) 30 MG tablet Take 1 tablet (30 mg total) by mouth 2 (two) times daily. 60 tablet 1   calcium-vitamin D (OSCAL WITH D) 250-125 MG-UNIT tablet Take 1 tablet by mouth daily.     escitalopram (LEXAPRO) 20 MG tablet Take 1 tablet (20 mg total) by mouth daily. 30 tablet 1   Multiple Vitamins-Minerals (MULTIVITAMIN PO) Take 1 tablet by mouth daily.     omeprazole (PRILOSEC) 10 MG capsule Take 10 mg by mouth daily.     Rimegepant Sulfate (NURTEC) 75 MG TBDP Take 75 mg by mouth as needed (take 1 at onset of headache, max is 1 tablet in 24 hours). 8 tablet 11   topiramate (TOPAMAX) 50 MG tablet Take 3 tablets (150 mg total) by mouth at bedtime. 270 tablet 3   No current facility-administered medications for this visit.       Psychiatric Specialty Exam: Please take into account limitations in obtaining a full mental status exam in the context of this mode of communication Review of Systems  Musculoskeletal:  Positive for arthralgias.  does not endorse  There were no vitals taken for this visit.There is no height or weight on file to calculate BMI.  General Appearance: NA  Eye Contact:  NA  Speech:  Normal Rate  Volume:  Normal  Mood: Reports she is feeling better, endorses improved mood, affect appears improved and more reactive  Affect: As above  Thought Process:  Linear and Descriptions of Associations: Intact  Orientation:  Full (Time, Place, and Person)  Thought Content:  No hallucinations, no delusions    Suicidal Thoughts:  No denies having any self-injurious or suicidal ideations   Homicidal Thoughts:  No  Memory:   Recent and remote grossly intact  Judgement:  Other:  Present  Insight:  Present  Psychomotor Activity:  NA  Concentration:  Concentration: Good and Attention Span: Good  Recall:  Good  Fund of Knowledge: Good  Language: Good  Akathisia:  Negative  Handed:  Right  AIMS (if  indicated):   Assets:  Communication Skills Desire for Improvement Resilience  ADL's:  Intact  Cognition: WNL  Sleep:  Good   Screenings:   Assessment and Plan:   65 year old female with a history of depression/anxiety.    She is currently doing well.  Reports feeling better.  She describes improved mood and presents with a fuller range of affect.  No SI and presents future oriented.  She is starting a new job later this week and is excited about this.  She also states that having a job it is helping her sense of  self-esteem and also her finances which have been a significant stressor lately. Overall states she is doing well at this time. She is tolerating current medication regimen well, denies side effects. Side effects of been reviewed.   Continue individual psychotherapy Will see in about 4-6  weeks , agrees to contact clinic sooner if any worsening or medication concerns prior. Continue current medication regimen.  (Buspar 30 mgrs BID, Lexapro 20 mgrs QDAY, Abilify (5 mg )QDAY       Sharon Cole MDPatient ID: EZELL MELIKIAN, female   DOB: 10/21/1955, 65 y.o.   MRN: [id number]

## 2021-04-16 NOTE — Progress Notes (Signed)
[facility] MD/PA/NP OP Progress Note  Sharon Cole  MRN:  [id number] 03/05/2021   Chief Complaint: Medication management appointment HPI: This appointment was conducted via phone - Patient's identity verified.   Limitations associated with this type of communication have been reviewed.   Location of parties- Patient-Home MD-behavioral services outpatient clinic Duration 20 minutes  65 year old female, history of anxiety/depression.  Has been diagnosed with GAD and MDD in the past .   Reports she has been doing well . She is now living alone after ending relationship with fiance . 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 She states she is also enjoying her job,which she started a couple of weeks ago, although thinks she is going to have to switch jobs in the near future due to financial considerations .  She denies depression at this time, denies anhedonia, reports an improved sense of self efficacy and self esteem,  and describes generally improved and stable mood . Denies SI and is future oriented .  Denies medication side effects and feels current medication regimen has been effective and well tolerated      Visit Diagnosis: MDD by history Past Psychiatric History:   Past Medical History:  Past Medical History:  Diagnosis Date   Anxiety    Bradycardia    Common migraine with intractable migraine 07/03/2016   Depression    Dizziness    Headache    Menopause    Syncope     Past Surgical History:  Procedure Laterality Date   BACK SURGERY     cyst removal    BREAST SURGERY     breast reduction   BUNIONECTOMY     CHOLECYSTECTOMY     KNEE ARTHROSCOPY      Family Psychiatric History:   Family History:  Family History  Problem Relation Age of Onset   Heart disease Mother    Heart disease Brother     Heart disease Maternal Grandmother    Heart disease Maternal Grandfather    Cancer Son        unknown    Social History:  Social History   Socioeconomic History   Marital status: Significant Other    Spouse name: Not on file   Number of children: 2   Years of education: Masters   Highest education level: Not on file  Occupational History   Not on file  Tobacco Use   Smoking status: Never   Smokeless tobacco: Never  Vaping Use   Vaping Use: Never used  Substance and Sexual Activity   Alcohol use: Yes    Alcohol/week: 1.0 standard drink    Types: 1 Glasses of wine per week    Comment: daily   Drug use: No   Sexual activity: Yes    Partners: Male  Other Topics Concern   Not on file  Social History Narrative   Lives   Caffeine use:    Drinks 16oz caffeine drinks a day    Social Determinants of Radio broadcast assistant Strain: Not on file  Food Insecurity: Not on file  Transportation Needs: Not on file  Physical Activity: Not on file  Stress: Not on file  Social Connections: Not on file    Allergies: No Known Allergies  Metabolic Disorder Labs: No results found for: HGBA1C, MPG No results found  for: PROLACTIN Lab Results  Component Value Date   CHOL 168 01/02/2015   TRIG 91.0 01/02/2015   HDL 53.70 01/02/2015   CHOLHDL 3 01/02/2015   VLDL 18.2 01/02/2015   LDLCALC 96 01/02/2015   LDLCALC 98 08/10/2013   Lab Results  Component Value Date   TSH 1.04 01/02/2015   TSH 2.986 08/10/2013    Therapeutic Level Labs: No results found for: LITHIUM No results found for: VALPROATE No components found for:  CBMZ  Current Medications: Current Outpatient Medications  Medication Sig Dispense Refill   ARIPiprazole (ABILIFY) 5 MG tablet Take 1 tablet (5 mg total) by mouth daily. 30 tablet 1   busPIRone (BUSPAR) 30 MG tablet Take 1 tablet (30 mg total) by mouth 2 (two) times daily. 60 tablet 1   calcium-vitamin D (OSCAL WITH D) 250-125 MG-UNIT tablet Take 1  tablet by mouth daily.     escitalopram (LEXAPRO) 20 MG tablet Take 1 tablet (20 mg total) by mouth daily. 30 tablet 1   Multiple Vitamins-Minerals (MULTIVITAMIN PO) Take 1 tablet by mouth daily.     omeprazole (PRILOSEC) 10 MG capsule Take 10 mg by mouth daily.     Rimegepant Sulfate (NURTEC) 75 MG TBDP Take 75 mg by mouth as needed (take 1 at onset of headache, max is 1 tablet in 24 hours). 8 tablet 11   topiramate (TOPAMAX) 50 MG tablet Take 3 tablets (150 mg total) by mouth at bedtime. 270 tablet 3   No current facility-administered medications for this visit.       Psychiatric Specialty Exam: Please take into account limitations in obtaining a full mental status exam in the context of this mode of communication ROS- does not endorse  There were no vitals taken for this visit.There is no height or weight on file to calculate BMI.  General Appearance: NA  Eye Contact:  NA  Speech:  Normal Rate  Volume:  Normal  Mood: Reports mood is improved overall, and presents euthymic at this time  Affect: reactive, appropriate  Thought Process:  Linear and Descriptions of Associations: Intact  Orientation:  Full (Time, Place, and Person)  Thought Content:  No hallucinations, no delusions    Suicidal Thoughts:  No denies having any self-injurious or suicidal ideations   Homicidal Thoughts:  No  Memory:   Recent and remote grossly intact  Judgement:  Other:  Present  Insight:  Present  Psychomotor Activity:  NA  Concentration:  Concentration: Good and Attention Span: Good  Recall:  Good  Fund of Knowledge: Good  Language: Good  Akathisia:  Negative  Handed:  Right  AIMS (if indicated):   Assets:  Communication Skills Desire for Improvement Resilience  ADL's:  Intact  Cognition: WNL  Sleep:  Good   Screenings:   Assessment and Plan:   65 year old female with a history of depression/anxiety.    She reports she is doing well , and presents euthymic, with a full range of affect .  No significant anhedonia or other neuro-vegetative symptoms reported at this time. Recently started a part time job and has been active with family ( son, grandchildren) . Tolerating current medication regimen well, feels it has been helpful.   Side effects reviewed .    Continue individual psychotherapy Will see in about 6-8  weeks , agrees to contact clinic sooner if any worsening or medication concerns prior. Continue current medication regimen.  (Buspar 30 mgrs BID, Lexapro 20 mgrs QDAY, Abilify 5 mg QDAY )  F Kelley Polinsky MDPatient ID: Sharon Cole, female   DOB: 03/29/1956, 65 y.o.   MRN: [id number]

## 2021-04-17 ENCOUNTER — Ambulatory Visit: Payer: BLUE CROSS/BLUE SHIELD | Admitting: Psychology

## 2021-04-24 ENCOUNTER — Ambulatory Visit (INDEPENDENT_AMBULATORY_CARE_PROVIDER_SITE_OTHER): Payer: BLUE CROSS/BLUE SHIELD | Admitting: Psychology

## 2021-04-24 DIAGNOSIS — F331 Major depressive disorder, recurrent, moderate: Secondary | ICD-10-CM

## 2021-04-24 NOTE — Progress Notes (Signed)
04/24/2021  Treatment Plan: Diagnosis 296.32 (Major depressive affective disorder, recurrent episode, moderate) [n/a]  300.02 (Generalized anxiety disorder) [n/a]  Symptoms Depressed or irritable mood. (Status: maintained) -- No Description Entered  Feelings of hopelessness, worthlessness, or inappropriate guilt. (Status: maintained) -- No Description Entered  Lack of energy. (Status: maintained) -- No Description Entered  Low self-esteem. (Status: maintained) -- No Description Entered  Medication Status compliance  Safety none  If Suicidal or Homicidal State Action Taken: unspecified  Current Risk: low Medications Abilify (Dosage: .22m)  Buspar (Dosage: 381m  Citalopram (Dosage: 2069m Topomax (Dosage: unknown)  Objectives Related Problem: Recognize, accept, and cope with feelings of depression. Description: Identify and replace thoughts and beliefs that support depression. Target Date: 2021-06-14 Frequency: Daily Modality: individual Progress: 30%  Related Problem: Recognize, accept, and cope with feelings of depression. Description: Learn and implement behavioral strategies to overcome depression. Target Date: 2021-06-14 Frequency: Daily Modality: individual Progress: 60%  Related Problem: Recognize, accept, and cope with feelings of depression. Description: Verbalize an understanding and resolution of current interpersonal problems. Target Date: 2021-06-14 Frequency: Daily Modality: individual Progress: 50%  Related Problem: Recognize, accept, and cope with feelings of depression. Description: Verbalize insight into how past relationships may be influencing current experiences with depression. Target Date: 2021-06-14 Frequency: Daily Modality: individual Progress: 60%  Client Response full compliance  Service Location Location, [address] [place] [id number] Code cpt [id number]/Reframe  Facilitate problem solving   Identify/label emotions  Validate/empathize  Emotion regulation skills  Self care activities  Lifestyle change (exercise, nutrition)  Self-monitoring  Identified an insight  Rationally challenge thoughts or beliefs/cognitive restructuring  Comments  F33.2  Goals: Wants to work on being genuine and being satisfied with herself and develop stronger self-esteem. Also, would like to improve her primary relationship and have her interpersonal and romantic needs met. This goal is now met, as she had ended the relationship. Wants to continue self-care and maintain weight loss. Needs to develop strategy to manage relationship with her son Sharon Browho struggles with mental health issues. Goal date 2-23. 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 Will also attempt to create a small, but satisfying social network with like-minded people. Goal date is 2-23. Goal dates have been revised on treatment plan, but are not reflected on note below. New dates will be populated on next note.  Meds: Topamax, Citalopram (81m52mBuspar, Abilify 5mg 39mtient agrees to video Webex session due to the Coronavirus pandemic. She is at home and I am at my home office.   Sharon Cole says that her brother is very close to settling the estate. This will be a big relief. She is distressed that she is over-eating lately. She says that it has to do with her feelings about men and relationships with them. She thinks that her weight is a way to distance herself from men and intimacy. She also feels she has an addiction to sweets and is struggling to "cut it off". We reflected on what has worked in the past to help her get control. Neither are at play right now and she is frustrated with the amount she has gained. She is trying to use the Keto diet. She had a meeting with [name] Liliane Channelit was mostly uneventful. She feels he has no insight into why she was not happy with him. She is satisfied on how the meeting  went with no regrets. States  that she will be with her boys on the holiday. Feeling more stable and less agitated.      [name], PhD  Time: 4:15-5:00 45 minutes.

## 2021-05-08 ENCOUNTER — Ambulatory Visit: Payer: BLUE CROSS/BLUE SHIELD | Admitting: Psychology

## 2021-05-15 ENCOUNTER — Telehealth (HOSPITAL_BASED_OUTPATIENT_CLINIC_OR_DEPARTMENT_OTHER): Payer: BLUE CROSS/BLUE SHIELD | Admitting: Psychiatry

## 2021-05-15 ENCOUNTER — Other Ambulatory Visit: Payer: Self-pay

## 2021-05-15 ENCOUNTER — Encounter (HOSPITAL_COMMUNITY): Payer: Self-pay | Admitting: Psychiatry

## 2021-05-15 ENCOUNTER — Ambulatory Visit (INDEPENDENT_AMBULATORY_CARE_PROVIDER_SITE_OTHER): Payer: BLUE CROSS/BLUE SHIELD | Admitting: Psychology

## 2021-05-15 DIAGNOSIS — F331 Major depressive disorder, recurrent, moderate: Secondary | ICD-10-CM | POA: Diagnosis not present

## 2021-05-15 DIAGNOSIS — F411 Generalized anxiety disorder: Secondary | ICD-10-CM

## 2021-05-15 DIAGNOSIS — F324 Major depressive disorder, single episode, in partial remission: Secondary | ICD-10-CM

## 2021-05-15 MED ORDER — ESCITALOPRAM OXALATE 20 MG PO TABS: 20.0000 mg | ORAL_TABLET | Freq: Every day | ORAL | 1 refills | Status: DC

## 2021-05-15 MED ORDER — BUSPIRONE HCL 30 MG PO TABS: 30.0000 mg | ORAL_TABLET | Freq: Two times a day (BID) | ORAL | 1 refills | Status: DC

## 2021-05-15 MED ORDER — ARIPIPRAZOLE 5 MG PO TABS: 5.0000 mg | ORAL_TABLET | Freq: Every day | ORAL | 1 refills | Status: DC

## 2021-05-15 NOTE — Progress Notes (Signed)
Sharon Cole is a 66 y.o. female patient         05/15/2021  Treatment Plan: Diagnosis 296.32 (Major depressive affective disorder, recurrent episode, moderate) [n/a]  300.02 (Generalized anxiety disorder) [n/a]  Symptoms Depressed or irritable mood. (Status: maintained) -- No Description Entered  Feelings of hopelessness, worthlessness, or inappropriate guilt. (Status: maintained) -- No Description Entered  Lack of energy. (Status: maintained) -- No Description Entered  Low self-esteem. (Status: maintained) -- No Description Entered  Medication Status compliance  Safety none  If Suicidal or Homicidal State Action Taken: unspecified  Current Risk: low Medications Abilify (Dosage: .66m)  Buspar (Dosage: 341m  Citalopram (Dosage: 2069m Topomax (Dosage: unknown)  Objectives Related Problem: Recognize, accept, and cope with feelings of depression. Description: Identify and replace thoughts and beliefs that support depression. Target Date: 2021-06-14 Frequency: Daily Modality: individual Progress: 30%  Related Problem: Recognize, accept, and cope with feelings of depression. Description: Learn and implement behavioral strategies to overcome depression. Target Date: 2021-06-14 Frequency: Daily Modality: individual Progress: 60%  Related Problem: Recognize, accept, and cope with feelings of depression. Description: Verbalize an understanding and resolution of current interpersonal problems. Target Date: 2021-06-14 Frequency: Daily Modality: individual Progress: 50%  Related Problem: Recognize, accept, and cope with feelings of depression. Description: Verbalize insight into how past relationships may be influencing current experiences with depression. Target Date: 2021-06-14 Frequency: Daily Modality: individual Progress: 60%  Client Response full compliance  Service Location Location, [address] [place] [id number] Code cpt [id number]/Reframe  Facilitate problem solving  Identify/label emotions  Validate/empathize  Emotion regulation skills  Self care activities  Lifestyle change (exercise, nutrition)  Self-monitoring  Identified an insight  Rationally challenge thoughts or beliefs/cognitive restructuring  Comments  F33.2  Goals: Wants to work on being genuine and being satisfied with herself and develop stronger self-esteem. Also, would like to improve her primary relationship and have her interpersonal and romantic needs met. This goal is now met, as she had ended the relationship. Wants to continue self-care and maintain weight loss. Needs to develop strategy to manage relationship with her son [name] struggles with mental health issues. Goal date 2-23. 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 Will also attempt to create a small, but satisfying social network with like-minded people. Goal date is 2-23. Goal dates have been revised on treatment plan, but are not reflected on note below. New dates will be populated on next note.  Meds: Topamax, Citalopram (54m40mBuspar, Abilify 5mg 97mtient agrees to video Webex session due to the Coronavirus pandemic. She is at home and I am at my home office.   Sharon Cole says the holidays went well. She says she is actively looking for full time work. She needs more than part-time and wants something more stimulating. She has already submitted one application. She states that she is not feeling happy. She says "the sum of her life" does not feel happy....career, financial, relationships and friendships have all fallen short of hopes and expectations. She says she feels lonely and struggles to understanding why her efforts to have friends has been unsuccessful. She does say that she tends to not work very hard to seek friendships. She has tried, but doesn't put herself in a position to meet people. She says she just cannot let go of those bad  decisions early in her life and has been paying the price. She does  not feel successful in her career because she never excelled. 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  Sharon Cole reports that, besides the kids, she has nothing for which she feels pride. HW is to make list of things from past for which she feels pride.        Sharon Morel, PhD  Time: 1:10p-2:00 50 minutes.

## 2021-05-15 NOTE — Progress Notes (Signed)
[facility] MD/PA/NP OP Progress Note  Sharon Cole  MRN:  [id number] 05/15/2021   Chief Complaint: Medication management appointment HPI: This appointment was conducted via phone - Patient's identity verified.   Limitations associated with this type of communication have been reviewed.   Location of parties- Patient-Home MD-behavioral services outpatient clinic Duration 20 minutes  66 year old female, history of anxiety/depression.  Has been diagnosed with GAD and MDD in the past .   She reports she is generally doing well  and states that she is doing well in daily activities . Denies significant neuro-vegetative symptoms. Denies SI and is future oriented .  Functioning well in daily activities to include working part time .  She is also spending more time with her adult children and with her grandchildren which she enjoys a lot . States she does have some ongoing anxiety . She reports she has tended to ruminate about  her current life situation and her self esteem , although objectively realizes she is resilient and has significant coping skills . She states her current job , which is part time , does not pay enough, and financial stressors are ongoing . She is in the process of looking for/interviewing for other job opportunities. She describes struggling with a subjective feeling of insecurity as she applies and interviews . She also states she is self conscious about her dentition and states she currently does not have resources for dental work. We reviewed ego strengths, coping skills.  Of note, she states she has a good therapeutic alliance with therapist and continues to work on above with therapist as well . Denies medication side effects, feels current medications are helpful and well tolerated       Visit Diagnosis: MDD by history Past Psychiatric History:   Past Medical History:  Past Medical History:  Diagnosis Date   Anxiety    Bradycardia    Common migraine with  intractable migraine 07/03/2016   Depression    Dizziness    Headache    Menopause    Syncope     Past Surgical History:  Procedure Laterality Date   BACK SURGERY     cyst removal    BREAST SURGERY     breast reduction   BUNIONECTOMY     CHOLECYSTECTOMY     KNEE ARTHROSCOPY      Family Psychiatric History:   Family History:  Family History  Problem Relation Age of Onset   Heart disease Mother    Heart disease Brother    Heart disease Maternal Grandmother    Heart disease Maternal Grandfather    Cancer Son        unknown    Social History:  Social History   Socioeconomic History   Marital status: Significant Other    Spouse name: Not on file   Number of children: 2   Years of education: Masters   Highest education level: Not on file  Occupational History   Not on file  Tobacco Use   Smoking status: Never   Smokeless tobacco: Never  Vaping Use   Vaping Use: Never used  Substance and Sexual Activity   Alcohol use: Yes    Alcohol/week: 1.0 standard drink    Types: 1 Glasses of wine per week    Comment: daily   Drug use: No   Sexual activity: Yes    Partners: Male  Other Topics Concern   Not on file  Social History Narrative   Lives   Caffeine use:  Drinks 16oz caffeine drinks a day    Social Determinants of Radio broadcast assistant Strain: Not on file  Food Insecurity: Not on file  Transportation Needs: Not on file  Physical Activity: Not on file  Stress: Not on file  Social Connections: Not on file    Allergies: No Known Allergies  Metabolic Disorder Labs: No results found for: HGBA1C, MPG No results found for: PROLACTIN Lab Results  Component Value Date   CHOL 168 01/02/2015   TRIG 91.0 01/02/2015   HDL 53.70 01/02/2015   CHOLHDL 3 01/02/2015   VLDL 18.2 01/02/2015   LDLCALC 96 01/02/2015   LDLCALC 98 08/10/2013   Lab Results  Component Value Date   TSH 1.04 01/02/2015   TSH 2.986 08/10/2013    Therapeutic Level Labs: No  results found for: LITHIUM No results found for: VALPROATE No components found for:  CBMZ  Current Medications: Current Outpatient Medications  Medication Sig Dispense Refill   ARIPiprazole (ABILIFY) 5 MG tablet Take 1 tablet (5 mg total) by mouth daily. 30 tablet 1   busPIRone (BUSPAR) 30 MG tablet Take 1 tablet (30 mg total) by mouth 2 (two) times daily. 60 tablet 1   calcium-vitamin D (OSCAL WITH D) 250-125 MG-UNIT tablet Take 1 tablet by mouth daily.     escitalopram (LEXAPRO) 20 MG tablet Take 1 tablet (20 mg total) by mouth daily. 30 tablet 1   Multiple Vitamins-Minerals (MULTIVITAMIN PO) Take 1 tablet by mouth daily.     omeprazole (PRILOSEC) 10 MG capsule Take 10 mg by mouth daily.     Rimegepant Sulfate (NURTEC) 75 MG TBDP Take 75 mg by mouth as needed (take 1 at onset of headache, max is 1 tablet in 24 hours). 8 tablet 11   topiramate (TOPAMAX) 50 MG tablet Take 3 tablets (150 mg total) by mouth at bedtime. 270 tablet 3   No current facility-administered medications for this visit.       Psychiatric Specialty Exam: Please take into account limitations in obtaining a full mental status exam in the context of this mode of communication ROS- does not endorse  There were no vitals taken for this visit.There is no height or weight on file to calculate BMI.  General Appearance: NA  Eye Contact:  NA  Speech:  Normal Rate  Volume:  Normal  Mood: Reports mood is partially improved , although endorses a tendency to ruminate about stressors, as above   Affect: reactive, appropriate  Thought Process:  Linear and Descriptions of Associations: Intact  Orientation:  Full (Time, Place, and Person)  Thought Content:  No hallucinations, no delusions    Suicidal Thoughts:  No denies having any self-injurious or suicidal ideations , future oriented, and establishes love for her children as another protective factor  Homicidal Thoughts:  No  Memory:   Recent and remote grossly intact   Judgement:  Other:  Present  Insight:  Present  Psychomotor Activity:  NA  Concentration:  Concentration: Good and Attention Span: Good  Recall:  Good  Fund of Knowledge: Good  Language: Good  Akathisia:  Negative  Handed:  Right  AIMS (if indicated):   Assets:  Communication Skills Desire for Improvement Resilience  ADL's:  Intact  Cognition: WNL  Sleep:  Good   Screenings:   Assessment and Plan:   66 year old female with a history of depression/anxiety.    She reports that in general she has been doing well and describes mood as stable . Denies significant  neuro-vegetative symptoms at this time and is future oriented . No SI.She does report some ongoing ruminations , particularly revolving around finances and getting another better paying job . She responds well to support , encouragement and continues to see her therapist regularly  She denies medication side effects, feels medications have been helpful and well tolerated    Continue individual psychotherapy Will see in about 4 weeks , agrees to contact clinic sooner if any worsening or medication concerns prior. Continue current medication regimen.  (Buspar 30 mgrs BID, Lexapro 20 mgrs QDAY, Abilify 5 mg QDAY )      F Tanyah Debruyne MD Patient ID: Sharon Cole, female   DOB: 04-24-1956, 66 y.o.   MRN: [id number]

## 2021-05-15 NOTE — Progress Notes (Signed)
[facility] MD/PA/NP OP Progress Note  GENEVE KIMPEL  MRN:  [id number] 03/05/2021   Chief Complaint: Medication management appointment HPI: This appointment was conducted via phone - Patient's identity verified.   Limitations associated with this type of communication have been reviewed.   Location of parties- Patient-Home MD-behavioral services outpatient clinic Duration 20 minutes  66 year old female, history of anxiety/depression.  Has been diagnosed with GAD and MDD in the past .   Reports she has been doing well . She is now living alone after ending relationship with fiance . 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 She states she is also enjoying her job,which she started a couple of weeks ago, although thinks she is going to have to switch jobs in the near future due to financial considerations .  She denies depression at this time, denies anhedonia, reports an improved sense of self efficacy and self esteem,  and describes generally improved and stable mood . Denies SI and is future oriented .  Denies medication side effects and feels current medication regimen has been effective and well tolerated      Visit Diagnosis: MDD by history Past Psychiatric History:   Past Medical History:  Past Medical History:  Diagnosis Date   Anxiety    Bradycardia    Common migraine with intractable migraine 07/03/2016   Depression    Dizziness    Headache    Menopause    Syncope     Past Surgical History:  Procedure Laterality Date   BACK SURGERY     cyst removal    BREAST SURGERY     breast reduction   BUNIONECTOMY     CHOLECYSTECTOMY     KNEE ARTHROSCOPY      Family Psychiatric History:   Family History:  Family History  Problem Relation Age of Onset   Heart disease Mother    Heart disease Brother     Heart disease Maternal Grandmother    Heart disease Maternal Grandfather    Cancer Son        unknown    Social History:  Social History   Socioeconomic History   Marital status: Significant Other    Spouse name: Not on file   Number of children: 2   Years of education: Masters   Highest education level: Not on file  Occupational History   Not on file  Tobacco Use   Smoking status: Never   Smokeless tobacco: Never  Vaping Use   Vaping Use: Never used  Substance and Sexual Activity   Alcohol use: Yes    Alcohol/week: 1.0 standard drink    Types: 1 Glasses of wine per week    Comment: daily   Drug use: No   Sexual activity: Yes    Partners: Male  Other Topics Concern   Not on file  Social History Narrative   Lives   Caffeine use:    Drinks 16oz caffeine drinks a day    Social Determinants of Radio broadcast assistant Strain: Not on file  Food Insecurity: Not on file  Transportation Needs: Not on file  Physical Activity: Not on file  Stress: Not on file  Social Connections: Not on file    Allergies: No Known Allergies  Metabolic Disorder Labs: No results found for: HGBA1C, MPG No results found  for: PROLACTIN Lab Results  Component Value Date   CHOL 168 01/02/2015   TRIG 91.0 01/02/2015   HDL 53.70 01/02/2015   CHOLHDL 3 01/02/2015   VLDL 18.2 01/02/2015   LDLCALC 96 01/02/2015   LDLCALC 98 08/10/2013   Lab Results  Component Value Date   TSH 1.04 01/02/2015   TSH 2.986 08/10/2013    Therapeutic Level Labs: No results found for: LITHIUM No results found for: VALPROATE No components found for:  CBMZ  Current Medications: Current Outpatient Medications  Medication Sig Dispense Refill   ARIPiprazole (ABILIFY) 5 MG tablet Take 1 tablet (5 mg total) by mouth daily. 30 tablet 1   busPIRone (BUSPAR) 30 MG tablet Take 1 tablet (30 mg total) by mouth 2 (two) times daily. 60 tablet 1   calcium-vitamin D (OSCAL WITH D) 250-125 MG-UNIT tablet Take 1  tablet by mouth daily.     escitalopram (LEXAPRO) 20 MG tablet Take 1 tablet (20 mg total) by mouth daily. 30 tablet 1   Multiple Vitamins-Minerals (MULTIVITAMIN PO) Take 1 tablet by mouth daily.     omeprazole (PRILOSEC) 10 MG capsule Take 10 mg by mouth daily.     Rimegepant Sulfate (NURTEC) 75 MG TBDP Take 75 mg by mouth as needed (take 1 at onset of headache, max is 1 tablet in 24 hours). 8 tablet 11   topiramate (TOPAMAX) 50 MG tablet Take 3 tablets (150 mg total) by mouth at bedtime. 270 tablet 3   No current facility-administered medications for this visit.       Psychiatric Specialty Exam: Please take into account limitations in obtaining a full mental status exam in the context of this mode of communication ROS- does not endorse  There were no vitals taken for this visit.There is no height or weight on file to calculate BMI.  General Appearance: NA  Eye Contact:  NA  Speech:  Normal Rate  Volume:  Normal  Mood: Reports mood is improved overall, and presents euthymic at this time  Affect: reactive, appropriate  Thought Process:  Linear and Descriptions of Associations: Intact  Orientation:  Full (Time, Place, and Person)  Thought Content:  No hallucinations, no delusions    Suicidal Thoughts:  No denies having any self-injurious or suicidal ideations   Homicidal Thoughts:  No  Memory:   Recent and remote grossly intact  Judgement:  Other:  Present  Insight:  Present  Psychomotor Activity:  NA  Concentration:  Concentration: Good and Attention Span: Good  Recall:  Good  Fund of Knowledge: Good  Language: Good  Akathisia:  Negative  Handed:  Right  AIMS (if indicated):   Assets:  Communication Skills Desire for Improvement Resilience  ADL's:  Intact  Cognition: WNL  Sleep:  Good   Screenings:   Assessment and Plan:   66 year old female with a history of depression/anxiety.    She reports she is doing well , and presents euthymic, with a full range of affect .  No significant anhedonia or other neuro-vegetative symptoms reported at this time. Recently started a part time job and has been active with family ( son, grandchildren) . Tolerating current medication regimen well, feels it has been helpful.   Side effects reviewed .    Continue individual psychotherapy Will see in about 6-8  weeks , agrees to contact clinic sooner if any worsening or medication concerns prior. Continue current medication regimen.  (Buspar 30 mgrs BID, Lexapro 20 mgrs QDAY, Abilify 5 mg QDAY )  F Clarisse Rodriges MDPatient ID: LUCILLIE KIESEL, female   DOB: 26-Dec-1955, 66 y.o.   MRN: [id number] Patient ID: LORELEI HEIKKILA, female   DOB: 1955-08-21, 66 y.o.   MRN: [id number]

## 2021-05-22 ENCOUNTER — Ambulatory Visit (INDEPENDENT_AMBULATORY_CARE_PROVIDER_SITE_OTHER): Payer: BLUE CROSS/BLUE SHIELD | Admitting: Psychology

## 2021-05-22 DIAGNOSIS — F324 Major depressive disorder, single episode, in partial remission: Secondary | ICD-10-CM | POA: Diagnosis not present

## 2021-05-22 NOTE — Progress Notes (Signed)
Sharon Cole is a 66 y.o. female patient   05/22/2021  Treatment Plan: Diagnosis 296.32 (Major depressive affective disorder, recurrent episode, moderate) [n/a]  300.02 (Generalized anxiety disorder) [n/a]  Symptoms Depressed or irritable mood. (Status: maintained) -- No Description Entered  Feelings of hopelessness, worthlessness, or inappropriate guilt. (Status: maintained) -- No Description Entered  Lack of energy. (Status: maintained) -- No Description Entered  Low self-esteem. (Status: maintained) -- No Description Entered  Medication Status compliance  Safety none  If Suicidal or Homicidal State Action Taken: unspecified  Current Risk: low Medications Abilify (Dosage: .60m)  Buspar (Dosage: 325m  Citalopram (Dosage: 2062m Topomax (Dosage: unknown)  Objectives Related Problem: Recognize, accept, and cope with feelings of depression. Description: Identify and replace thoughts and beliefs that support depression. Target Date: 2021-06-14 Frequency: Daily Modality: individual Progress: 30%  Related Problem: Recognize, accept, and cope with feelings of depression. Description: Learn and implement behavioral strategies to overcome depression. Target Date: 2021-06-14 Frequency: Daily Modality: individual Progress: 60%  Related Problem: Recognize, accept, and cope with feelings of depression. Description: Verbalize an understanding and resolution of current interpersonal problems. Target Date: 2021-06-14 Frequency: Daily Modality: individual Progress: 50%  Related Problem: Recognize, accept, and cope with feelings of depression. Description: Verbalize insight into how past relationships may be influencing current experiences with depression. Target Date: 2021-06-14 Frequency: Daily Modality: individual Progress: 60%  Client Response full compliance  Service Location Location, [address] [place] [id number] Code cpt [id number]/Reframe  Facilitate problem solving  Identify/label emotions  Validate/empathize  Emotion regulation skills  Self care activities  Lifestyle change (exercise, nutrition)  Self-monitoring  Identified an insight  Rationally challenge thoughts or beliefs/cognitive restructuring  Session notes:  F33.2  Goals: Wants to work on being genuine and being satisfied with herself and develop stronger self-esteem. Also, would like to improve her primary relationship and have her interpersonal and romantic needs met. This goal is now met, as she had ended the relationship. Wants to continue self-care and maintain weight loss. Needs to develop strategy to manage relationship with her son [name] struggles with mental health issues. Goal date 2-23. 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 Will also attempt to create a small, but satisfying social network with like-minded people. Goal date is 2-23.   Meds: Topamax, Citalopram (26m70mBuspar, Abilify 5mg 55mtient agrees to video Webex session due to the Coronavirus pandemic. She is at home and I am at my home office.   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 It is full time and she thinks it will be more interesting. Trying to remain optimistic, but says her mood is "fair". Doesn't feel great and is having a hard time getting motivated to do anything. She did go to a show with her son and grand kids, but then stayed home all weekend. DavidShanon Browinues to struggle and Sharon Cole fSuzi Rootss helpless. She says [employer] not pay for coundew         Sharon Cole  Time: 4:10p-5:00 50 minutes.               Sharon Cole

## 2021-05-29 ENCOUNTER — Ambulatory Visit (INDEPENDENT_AMBULATORY_CARE_PROVIDER_SITE_OTHER): Payer: BLUE CROSS/BLUE SHIELD | Admitting: Psychology

## 2021-05-29 DIAGNOSIS — F324 Major depressive disorder, single episode, in partial remission: Secondary | ICD-10-CM

## 2021-05-29 NOTE — Progress Notes (Signed)
Sharon Cole is a 66 y.o. female patient   05/29/2021  Treatment Plan: Diagnosis 296.32 (Major depressive affective disorder, recurrent episode, moderate) [n/a]  300.02 (Generalized anxiety disorder) [n/a]  Symptoms Depressed or irritable mood. (Status: maintained) -- No Description Entered  Feelings of hopelessness, worthlessness, or inappropriate guilt. (Status: maintained) -- No Description Entered  Lack of energy. (Status: maintained) -- No Description Entered  Low self-esteem. (Status: maintained) -- No Description Entered  Medication Status compliance  Safety none  If Suicidal or Homicidal State Action Taken: unspecified  Current Risk: low Medications Abilify (Dosage: .62m)  Buspar (Dosage: 317m  Citalopram (Dosage: 2060m Topomax (Dosage: unknown)  Objectives Related Problem: Recognize, accept, and cope with feelings of depression. Description: Identify and replace thoughts and beliefs that support depression. Target Date: 2021-06-14 Frequency: Daily Modality: individual Progress: 30%  Related Problem: Recognize, accept, and cope with feelings of depression. Description: Learn and implement behavioral strategies to overcome depression. Target Date: 2021-06-14 Frequency: Daily Modality: individual Progress: 60%  Related Problem: Recognize, accept, and cope with feelings of depression. Description: Verbalize an understanding and resolution of current interpersonal problems. Target Date: 2021-06-14 Frequency: Daily Modality: individual Progress: 50%  Related Problem: Recognize, accept, and cope with feelings of depression. Description: Verbalize insight into how past relationships may be influencing current experiences with depression. Target Date: 2021-06-14 Frequency: Daily Modality: individual Progress: 60%  Client Response full compliance  Service Location Location, [address] [place] [id number] Code cpt [id number]/Reframe  Facilitate problem solving  Identify/label emotions  Validate/empathize  Emotion regulation skills  Self care activities  Lifestyle change (exercise, nutrition)  Self-monitoring  Identified an insight  Rationally challenge thoughts or beliefs/cognitive restructuring  Session notes:  F33.2  Goals: Wants to work on being genuine and being satisfied with herself and develop stronger self-esteem. Also, would like to improve her primary relationship and have her interpersonal and romantic needs met. This goal is now met, as she had ended the relationship. Wants to continue self-care and maintain weight loss. Needs to develop strategy to manage relationship with her son Sharon Browho struggles with mental health issues. Goal date 2-23. 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 Will also attempt to create a small, but satisfying social network with like-minded people. Goal date is 2-23.   Meds: Topamax, Citalopram (108m21mBuspar, Abilify 5mg 63mtient agrees to video Webex session due to the Coronavirus pandemic. She is at home and I am at my home office.   Sharon Cole says that she did not get the job for which she had interviewed. 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 Her experience is specialized and it is difficult to find things that match. She is trying to stay positive and was only briefly upset. She is watching a lot of television. Has not been working on her art. 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 She agrees and states she will do that today. It will help remind her to work on her art when she has the opportunity.  She states that her esteem is as low as it has ever been. She is focused on her lack of professional success. Also feels bad about her weight even though she has lost 9 pounds in the  past 2 weeks.  She says her son Sharon Cole is "exhausting" and emotionally depleting.  She reflected that her life "blew up" when her mother got remarried. It meant a blended family and [place] [facility] [place] [facility], which was like "moving to another country". Went from being connected to having no connections and feeling excluded from the social groups in her new school. She gained 50 lbs. In one year. Her mother was very critical and much less available. The rejection from her biological father had a very negative impact on her. Did not know until she was 61 that her mother forgave the money he owed if he would stay away. She last saw him at 13, but tried to re-establish contact at 34 and 23, but ather rejected her efforts. Sharon Cole's efforts failed as well. We will discuss further at next session regarding the impact on her self-esteem.        [name], PhD  Time: 1:10p-2:00 50 minutes.                              [name], PhD

## 2021-06-05 ENCOUNTER — Ambulatory Visit (INDEPENDENT_AMBULATORY_CARE_PROVIDER_SITE_OTHER): Payer: BLUE CROSS/BLUE SHIELD | Admitting: Psychology

## 2021-06-05 DIAGNOSIS — F324 Major depressive disorder, single episode, in partial remission: Secondary | ICD-10-CM | POA: Diagnosis not present

## 2021-06-05 NOTE — Progress Notes (Signed)
Sharon Cole is a 66 y.o. female patient   06/05/2021  Treatment Plan: Diagnosis 296.32 (Major depressive affective disorder, recurrent episode, moderate) [n/a]  300.02 (Generalized anxiety disorder) [n/a]  Symptoms Depressed or irritable mood. (Status: maintained) -- No Description Entered  Feelings of hopelessness, worthlessness, or inappropriate guilt. (Status: maintained) -- No Description Entered  Lack of energy. (Status: maintained) -- No Description Entered  Low self-esteem. (Status: maintained) -- No Description Entered  Medication Status compliance  Safety none  If Suicidal or Homicidal State Action Taken: unspecified  Current Risk: low Medications Abilify (Dosage: .38m)  Buspar (Dosage: 364m  Citalopram (Dosage: 2022m Topomax (Dosage: unknown)  Objectives Related Problem: Recognize, accept, and cope with feelings of depression. Description: Identify and replace thoughts and beliefs that support depression. Target Date: 2021-06-14 Frequency: Daily Modality: individual Progress: 30%  Related Problem: Recognize, accept, and cope with feelings of depression. Description: Learn and implement behavioral strategies to overcome depression. Target Date: 2021-06-14 Frequency: Daily Modality: individual Progress: 60%  Related Problem: Recognize, accept, and cope with feelings of depression. Description: Verbalize an understanding and resolution of current interpersonal problems. Target Date: 2021-06-14 Frequency: Daily Modality: individual Progress: 50%  Related Problem: Recognize, accept, and cope with feelings of depression. Description: Verbalize insight into how past relationships may be influencing current experiences with depression. Target Date: 2021-06-14 Frequency: Daily Modality: individual Progress: 60%  Client Response full compliance  Service Location Location, [address] [place] [id number]  Code cpt [id number]/Reframe  Facilitate problem solving  Identify/label emotions  Validate/empathize  Emotion regulation skills  Self care activities  Lifestyle change (exercise, nutrition)  Self-monitoring  Identified an insight  Rationally challenge thoughts or beliefs/cognitive restructuring  Session notes:  F33.2  Goals: Wants to work on being genuine and being satisfied with herself and develop stronger self-esteem. Also, would like to improve her primary relationship and have her interpersonal and romantic needs met. This goal is now met, as she had ended the relationship. Wants to continue self-care and maintain weight loss. Needs to develop strategy to manage relationship with her son Sharon Cole struggles with mental health issues. Goal date 2-23. 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 Will also attempt to create a small, but satisfying social network with like-minded people. Goal date is 2-23.   Meds: Topamax, Citalopram (48m23mBuspar, Abilify 5mg 72mtient agrees to video Webex session due to the Coronavirus pandemic. She is at home and I am at my home office.   [name] that she is still feeling down. She went to a movie theatre over the weekend. She also went to see Cats with her brother and they had dinner together. She says he is "a bit more tolerable to me". She thinks that having her mother's estate settled has taken off some of the stress on him. She says he is still "a little consumed with it" in that he found it an impossible task. She does say that Sharon Sharon Severineing very fair to her and that makes the relationship between them better. She says that it is hard for her to get close to Sharon Sharon Severinuse he has such a good life...SigMarland KitchenMarland Kitchenficant Other relationship, kids, finances and career success. She feels he "cannot fathom what has not happened to him" and doesn't understand the difficulties that she and her son Sharon Cole.  She admits,  "it's me" and has told Sharon Cole that she cannot do more than what she is doing (in terms of establishing a better relationship with each other). She says Sharon Cole has always wanted a closer relationship, but she has not shared that she struggles with the comparison of their lives. She has always felt like the "black sheep" and that the other sibs would see her as having received preferential treatment. She feels that if she could figure out how to "be at peace", she could have more of a relationship with him.  She has been very frustrated in that she is sending out employment applications and still no prospects.               Sharon Morel, PhD  Time: 1:10p-2:00 50 minutes.                              Sharon Morel, PhD

## 2021-06-12 ENCOUNTER — Other Ambulatory Visit: Payer: Self-pay

## 2021-06-12 ENCOUNTER — Ambulatory Visit (INDEPENDENT_AMBULATORY_CARE_PROVIDER_SITE_OTHER): Payer: BLUE CROSS/BLUE SHIELD | Admitting: Psychology

## 2021-06-12 ENCOUNTER — Encounter (HOSPITAL_COMMUNITY): Payer: Self-pay | Admitting: Psychiatry

## 2021-06-12 ENCOUNTER — Telehealth (HOSPITAL_BASED_OUTPATIENT_CLINIC_OR_DEPARTMENT_OTHER): Payer: BLUE CROSS/BLUE SHIELD | Admitting: Psychiatry

## 2021-06-12 DIAGNOSIS — F411 Generalized anxiety disorder: Secondary | ICD-10-CM

## 2021-06-12 DIAGNOSIS — F324 Major depressive disorder, single episode, in partial remission: Secondary | ICD-10-CM

## 2021-06-12 DIAGNOSIS — F331 Major depressive disorder, recurrent, moderate: Secondary | ICD-10-CM

## 2021-06-12 MED ORDER — ARIPIPRAZOLE 5 MG PO TABS: 5.0000 mg | ORAL_TABLET | Freq: Every day | ORAL | 1 refills | Status: DC

## 2021-06-12 MED ORDER — BUSPIRONE HCL 30 MG PO TABS: 30.0000 mg | ORAL_TABLET | Freq: Two times a day (BID) | ORAL | 1 refills | Status: DC

## 2021-06-12 MED ORDER — ESCITALOPRAM OXALATE 20 MG PO TABS: 20.0000 mg | ORAL_TABLET | Freq: Every day | ORAL | 1 refills | Status: DC

## 2021-06-12 NOTE — Progress Notes (Signed)
[facility] MD/PA/NP OP Progress Note  Sharon Cole  MRN:  [id number] 05/15/2021   Chief Complaint: Medication management appointment HPI: This appointment was conducted via phone - Patient's identity verified.   Limitations associated with this type of communication have been reviewed.   Location of parties- Patient-Home MD-behavioral services outpatient clinic Duration 20 minutes  66 year old female, history of anxiety/depression.  Has been diagnosed with GAD and MDD in the past .   Reports that in general she has been doing " all right " but describes some increased anxiety mainly related to employment stressors . She is now living independently and has a part time job. She babysits her grandchildren a few times a week. She states her part time job is pleasant and not stressful but simply does not pay enough for her financial needs . She has been trying to find a full time job but it has been challenging and has had difficulties due to being overqualified for certain jobs .  She remains hopeful that she will be able to get a job soon and is confident in her abilities . We reviewed , provided support, encouragement . She is considering attending upcoming job fairs .  She reports medications have been well tolerated and helpful . She remains on same regimen and denies side effects.   Describes mood as stable in spite of above stressors. No SI.        Visit Diagnosis: MDD by history Past Psychiatric History:   Past Medical History:  Past Medical History:  Diagnosis Date   Anxiety    Bradycardia    Common migraine with intractable migraine 07/03/2016   Depression    Dizziness    Headache    Menopause    Syncope     Past Surgical History:  Procedure Laterality Date   BACK SURGERY     cyst removal    BREAST SURGERY     breast reduction   BUNIONECTOMY     CHOLECYSTECTOMY     KNEE ARTHROSCOPY      Family Psychiatric History:   Family History:  Family History  Problem  Relation Age of Onset   Heart disease Mother    Heart disease Brother    Heart disease Maternal Grandmother    Heart disease Maternal Grandfather    Cancer Son        unknown    Social History:  Social History   Socioeconomic History   Marital status: Significant Other    Spouse name: Not on file   Number of children: 2   Years of education: Masters   Highest education level: Not on file  Occupational History   Not on file  Tobacco Use   Smoking status: Never   Smokeless tobacco: Never  Vaping Use   Vaping Use: Never used  Substance and Sexual Activity   Alcohol use: Yes    Alcohol/week: 1.0 standard drink    Types: 1 Glasses of wine per week    Comment: daily   Drug use: No   Sexual activity: Yes    Partners: Male  Other Topics Concern   Not on file  Social History Narrative   Lives   Caffeine use:    Drinks 16oz caffeine drinks a day    Social Determinants of Health   Financial Resource Strain: Not on file  Food Insecurity: Not on file  Transportation Needs: Not on file  Physical Activity: Not on file  Stress: Not on file  Social Connections: Not  on file    Allergies: No Known Allergies  Metabolic Disorder Labs: No results found for: HGBA1C, MPG No results found for: PROLACTIN Lab Results  Component Value Date   CHOL 168 01/02/2015   TRIG 91.0 01/02/2015   HDL 53.70 01/02/2015   CHOLHDL 3 01/02/2015   VLDL 18.2 01/02/2015   LDLCALC 96 01/02/2015   LDLCALC 98 08/10/2013   Lab Results  Component Value Date   TSH 1.04 01/02/2015   TSH 2.986 08/10/2013    Therapeutic Level Labs: No results found for: LITHIUM No results found for: VALPROATE No components found for:  CBMZ  Current Medications: Current Outpatient Medications  Medication Sig Dispense Refill   ARIPiprazole (ABILIFY) 5 MG tablet Take 1 tablet (5 mg total) by mouth daily. 30 tablet 1   busPIRone (BUSPAR) 30 MG tablet Take 1 tablet (30 mg total) by mouth 2 (two) times daily. 60  tablet 1   calcium-vitamin D (OSCAL WITH D) 250-125 MG-UNIT tablet Take 1 tablet by mouth daily.     escitalopram (LEXAPRO) 20 MG tablet Take 1 tablet (20 mg total) by mouth daily. 30 tablet 1   Multiple Vitamins-Minerals (MULTIVITAMIN PO) Take 1 tablet by mouth daily.     omeprazole (PRILOSEC) 10 MG capsule Take 10 mg by mouth daily.     Rimegepant Sulfate (NURTEC) 75 MG TBDP Take 75 mg by mouth as needed (take 1 at onset of headache, max is 1 tablet in 24 hours). 8 tablet 11   topiramate (TOPAMAX) 50 MG tablet Take 3 tablets (150 mg total) by mouth at bedtime. 270 tablet 3   No current facility-administered medications for this visit.       Psychiatric Specialty Exam: Please take into account limitations in obtaining a full mental status exam in the context of this mode of communication ROS- does not endorse  There were no vitals taken for this visit.There is no height or weight on file to calculate BMI.  General Appearance: NA  Eye Contact:  NA  Speech:  Normal Rate  Volume:  Normal  Mood: Mood stable , generally improved , presents euthymic   Affect: reactive, anxious regarding stressors  Thought Process:  Linear and Descriptions of Associations: Intact  Orientation:  Full (Time, Place, and Person)  Thought Content:  No hallucinations, no delusions    Suicidal Thoughts:  No no SI, future oriented, ruminative about job/financial related stressors   Homicidal Thoughts:  No  Memory:   Recent and remote grossly intact  Judgement:  Other:  Present  Insight:  Present  Psychomotor Activity:  NA  Concentration:  Concentration: Good and Attention Span: Good  Recall:  Good  Fund of Knowledge: Good  Language: Good  Akathisia:  Negative  Handed:  Right  AIMS (if indicated):   Assets:  Communication Skills Desire for Improvement Resilience  ADL's:  Intact  Cognition: WNL  Sleep:  Good   Screenings:   Assessment and Plan:   66 year-old female with a history of  depression/anxiety.    Currently describes stable mood , tolerating medications well . Current medication combination has been effective and well tolerated. She does endorse some increased anxiety related to financial concerns and work related issues : currently has a part time job but does not pay enough and has been trying to get a full time job without success thus far .  No SI, future oriented .    Continue individual psychotherapy Will see in about 4-6  weeks , agrees to  contact clinic sooner if any worsening or medication concerns prior. Continue current medication regimen.  (Buspar 30 mgrs BID, Lexapro 20 mgrs QDAY, Abilify 5 mg QDAY ) Reports she has an upcoming appt with PCP-recommended she get Hgb A1C , Lipid Panel ( routine ,as on Abilify )      Sharon Kiing Deakin MD  Patient ID: Sharon Cole, female   DOB: [birth year], 66 y.o.   MRN: [id number]

## 2021-06-12 NOTE — Progress Notes (Signed)
Sharon Cole is a 66 y.o. female patient   06/12/2021  Treatment Plan: Diagnosis 296.32 (Major depressive affective disorder, recurrent episode, moderate) [n/a]  300.02 (Generalized anxiety disorder) [n/a]  Symptoms Depressed or irritable mood. (Status: maintained) -- No Description Entered  Feelings of hopelessness, worthlessness, or inappropriate guilt. (Status: maintained) -- No Description Entered  Lack of energy. (Status: maintained) -- No Description Entered  Low self-esteem. (Status: maintained) -- No Description Entered  Medication Status compliance  Safety none  If Suicidal or Homicidal State Action Taken: unspecified  Current Risk: low Medications Abilify (Dosage: .68m)  Buspar (Dosage: 378m  Citalopram (Dosage: 2067m Topomax (Dosage: unknown)  Objectives Related Problem: Recognize, accept, and cope with feelings of depression. Description: Identify and replace thoughts and beliefs that support depression. Target Date: 2022-01-12 Frequency: Daily Modality: individual Progress: 30%  Related Problem: Recognize, accept, and cope with feelings of depression. Description: Learn and implement behavioral strategies to overcome depression. Target Date: 2022-01-12 Frequency: Daily Modality: individual Progress: 60%  Related Problem: Recognize, accept, and cope with feelings of depression. Description: Verbalize an understanding and resolution of current interpersonal problems. Target Date: 2022-01-12 Frequency: Daily Modality: individual Progress: 50%  Related Problem: Recognize, accept, and cope with feelings of depression. Description: Verbalize insight into how past relationships may be influencing current experiences with depression. Target Date: 2022-01-12 Frequency: Daily Modality: individual Progress: 60%  Client Response full compliance  Service Location Location, [address] [place] [id number] Code cpt [id number]/Reframe  Facilitate problem solving  Identify/label emotions  Validate/empathize  Emotion regulation skills  Self care activities  Lifestyle change (exercise, nutrition)  Self-monitoring  Identified an insight  Rationally challenge thoughts or beliefs/cognitive restructuring  Session notes:  F33.2  Goals: Wants to work on being genuine and being satisfied with herself and develop stronger self-esteem. Also, would like to improve her primary relationship and have her interpersonal and romantic needs met. This goal is now met, as she had ended the relationship. Wants to continue self-care and maintain weight loss. Needs to develop strategy to manage relationship with her son Sharon Browho struggles with mental health issues. Goal date 2-23. 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 Will also attempt to create a small, but satisfying social network with like-minded people. Goal date is 9-23.   Meds: Topamax, Citalopram (9m64mBuspar, Abilify 5mg 22mtient agrees to video Webex session due to the Coronavirus pandemic. She is at home and I am at my home office.   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 She has been eating to soothe her distress. She has terrible body image and is self-conscious about her teeth. It has been 7 months since she and Sharon Cole up. She is back on web site for dating, where she met Sharon. We talked about emotional eating and ways she can curtail this behavior. She is going to consult with agency to tweak her resume to make it more attractive to employers.  She gets triggered to eat in afternoon after she has worked, spent time on job sites and then dating sites. All three are sources of frustration. She talked about her self-image issues and we discussed some strategies to take care of her physical insecurities.  Sharon Morel, PhD  Time: 1:10p-2:00 50 minutes.                              Sharon Morel, PhD

## 2021-06-19 ENCOUNTER — Ambulatory Visit (INDEPENDENT_AMBULATORY_CARE_PROVIDER_SITE_OTHER): Payer: BLUE CROSS/BLUE SHIELD | Admitting: Psychology

## 2021-06-19 DIAGNOSIS — F324 Major depressive disorder, single episode, in partial remission: Secondary | ICD-10-CM | POA: Diagnosis not present

## 2021-06-19 NOTE — Progress Notes (Signed)
Sharon Cole is a 66 y.o. female patient   06/19/2021  Treatment Plan: Diagnosis 296.32 (Major depressive affective disorder, recurrent episode, moderate) [n/a]  300.02 (Generalized anxiety disorder) [n/a]  Symptoms Depressed or irritable mood. (Status: maintained) -- No Description Entered  Feelings of hopelessness, worthlessness, or inappropriate guilt. (Status: maintained) -- No Description Entered  Lack of energy. (Status: maintained) -- No Description Entered  Low self-esteem. (Status: maintained) -- No Description Entered  Medication Status compliance  Safety none  If Suicidal or Homicidal State Action Taken: unspecified  Current Risk: low Medications Abilify (Dosage: .45m)  Buspar (Dosage: 325m  Citalopram (Dosage: 2058m Topomax (Dosage: unknown)  Objectives Related Problem: Recognize, accept, and cope with feelings of depression. Description: Identify and replace thoughts and beliefs that support depression. Target Date: 2022-01-12 Frequency: Daily Modality: individual Progress: 30%  Related Problem: Recognize, accept, and cope with feelings of depression. Description: Learn and implement behavioral strategies to overcome depression. Target Date: 2022-01-12 Frequency: Daily Modality: individual Progress: 60%  Related Problem: Recognize, accept, and cope with feelings of depression. Description: Verbalize an understanding and resolution of current interpersonal problems. Target Date: 2022-01-12 Frequency: Daily Modality: individual Progress: 50%  Related Problem: Recognize, accept, and cope with feelings of depression. Description: Verbalize insight into how past relationships may be influencing current experiences with depression. Target Date: 2022-01-12 Frequency: Daily Modality: individual Progress: 60%  Client Response full compliance  Service Location Location, [address] [place] [id number] Code cpt  [id number]/Reframe  Facilitate problem solving  Identify/label emotions  Validate/empathize  Emotion regulation skills  Self care activities  Lifestyle change (exercise, nutrition)  Self-monitoring  Identified an insight  Rationally challenge thoughts or beliefs/cognitive restructuring  Session notes:  F33.2  Goals: Wants to work on being genuine and being satisfied with herself and develop stronger self-esteem. Also, would like to improve her primary relationship and have her interpersonal and romantic needs met. This goal is now met, as she had ended the relationship. Wants to continue self-care and maintain weight loss. Needs to develop strategy to manage relationship with her son Sharon Cole struggles with mental health issues. Goal date 2-23. 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 Will also attempt to create a small, but satisfying social network with like-minded people. Goal date is 9-23.   Meds: Topamax, Citalopram (60m71mBuspar, Abilify 5mg 2mtient agrees to video Webex session due to the Coronavirus pandemic. She is at home and I am at my home office.   [name] she has been working on her art piece, which makes her feel better. She finally decided that she needed to spend a little time away from job hunting and fix up her resume. To change her resume, she now needs to set up an appointment with the person at [employer]. [facility] also contacted sister in law to get an additional referral for resume help. She feels much better staying away from resume. She is no longer eating out of control and is back to losing weight. She will also reach out to [facility] tomorrow to find out available services.                     GUTTEMarcelina Cole  Time: 4:10p-5:00 50 minutes.

## 2021-06-26 ENCOUNTER — Ambulatory Visit (INDEPENDENT_AMBULATORY_CARE_PROVIDER_SITE_OTHER): Payer: BLUE CROSS/BLUE SHIELD | Admitting: Psychology

## 2021-06-26 DIAGNOSIS — F324 Major depressive disorder, single episode, in partial remission: Secondary | ICD-10-CM | POA: Diagnosis not present

## 2021-06-26 NOTE — Progress Notes (Signed)
Sharon Cole is a 66 y.o. female patient   06/26/2021  Treatment Plan: Diagnosis 296.32 (Major depressive affective disorder, recurrent episode, moderate) [n/a]  300.02 (Generalized anxiety disorder) [n/a]  Symptoms Depressed or irritable mood. (Status: maintained) -- No Description Entered  Feelings of hopelessness, worthlessness, or inappropriate guilt. (Status: maintained) -- No Description Entered  Lack of energy. (Status: maintained) -- No Description Entered  Low self-esteem. (Status: maintained) -- No Description Entered  Medication Status compliance  Safety none  If Suicidal or Homicidal State Action Taken: unspecified  Current Risk: low Medications Abilify (Dosage: .12m)  Buspar (Dosage: 38m  Citalopram (Dosage: 2023m Topomax (Dosage: unknown)  Objectives Related Problem: Recognize, accept, and cope with feelings of depression. Description: Identify and replace thoughts and beliefs that support depression. Target Date: 2022-01-12 Frequency: Daily Modality: individual Progress: 30%  Related Problem: Recognize, accept, and cope with feelings of depression. Description: Learn and implement behavioral strategies to overcome depression. Target Date: 2022-01-12 Frequency: Daily Modality: individual Progress: 60%  Related Problem: Recognize, accept, and cope with feelings of depression. Description: Verbalize an understanding and resolution of current interpersonal problems. Target Date: 2022-01-12 Frequency: Daily Modality: individual Progress: 50%  Related Problem: Recognize, accept, and cope with feelings of depression. Description: Verbalize insight into how past relationships may be influencing current experiences with depression. Target Date: 2022-01-12 Frequency: Daily Modality: individual Progress: 60%  Client Response full compliance  Service Location Location, [address] [place] [id number] Code cpt [id number]/Reframe  Facilitate problem solving  Identify/label emotions  Validate/empathize  Emotion regulation skills  Self care activities  Lifestyle change (exercise, nutrition)  Self-monitoring  Identified an insight  Rationally challenge thoughts or beliefs/cognitive restructuring  Session notes:  F33.2  Goals: Wants to work on being genuine and being satisfied with herself and develop stronger self-esteem. Also, would like to improve her primary relationship and have her interpersonal and romantic needs met. This goal is now met, as she had ended the relationship. Wants to continue self-care and maintain weight loss. Needs to develop strategy to manage relationship with her son Sharon Cole struggles with mental health issues. Goal date 2-23. 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 Will also attempt to create a small, but satisfying social network with like-minded people. Goal date is 9-23.   Meds: Topamax, Citalopram (33m55mBuspar, Abilify 5mg 48mtient agrees to video Webex session due to the Coronavirus pandemic. She is at home and I am at my home office.   [name] states that she has been watching some self-help videos about self-love. She also went to emotional support group at [employer]. She also met with the person at [employer] [place] helping her re-write her resume and look for work. She has accomplished a lot in the past week. She is also checking out "[employer]" which will give her insight into job searching. She feels it is key to boost her confidence. She is wanting to also work on her friendship development.  Says she also has spent time with the grandchildren and is working on her art. This has been the most productive period that [name] hSuzi Rootshad in a long time. She is more optimistic than she has been with regard to her future.                        GUTTEMarcelina Cole  Time: 1:15p-2:00p 45  minutes.

## 2021-07-03 ENCOUNTER — Ambulatory Visit (INDEPENDENT_AMBULATORY_CARE_PROVIDER_SITE_OTHER): Payer: BLUE CROSS/BLUE SHIELD | Admitting: Psychology

## 2021-07-03 DIAGNOSIS — F324 Major depressive disorder, single episode, in partial remission: Secondary | ICD-10-CM | POA: Diagnosis not present

## 2021-07-03 NOTE — Progress Notes (Signed)
Sharon Cole is a 66 y.o. female patient   07/03/2021  Treatment Plan: Diagnosis 296.32 (Major depressive affective disorder, recurrent episode, moderate) [n/a]  300.02 (Generalized anxiety disorder) [n/a]  Symptoms Depressed or irritable mood. (Status: maintained) -- No Description Entered  Feelings of hopelessness, worthlessness, or inappropriate guilt. (Status: maintained) -- No Description Entered  Lack of energy. (Status: maintained) -- No Description Entered  Low self-esteem. (Status: maintained) -- No Description Entered  Medication Status compliance  Safety none  If Suicidal or Homicidal State Action Taken: unspecified  Current Risk: low Medications Abilify (Dosage: .62m)  Buspar (Dosage: 360m  Citalopram (Dosage: 2048m Topomax (Dosage: unknown)  Objectives Related Problem: Recognize, accept, and cope with feelings of depression. Description: Identify and replace thoughts and beliefs that support depression. Target Date: 2022-01-12 Frequency: Daily Modality: individual Progress: 30%  Related Problem: Recognize, accept, and cope with feelings of depression. Description: Learn and implement behavioral strategies to overcome depression. Target Date: 2022-01-12 Frequency: Daily Modality: individual Progress: 60%  Related Problem: Recognize, accept, and cope with feelings of depression. Description: Verbalize an understanding and resolution of current interpersonal problems. Target Date: 2022-01-12 Frequency: Daily Modality: individual Progress: 50%  Related Problem: Recognize, accept, and cope with feelings of depression. Description: Verbalize insight into how past relationships may be influencing current experiences with depression. Target Date: 2022-01-12 Frequency: Daily Modality: individual Progress: 60%  Client Response full compliance  Service Location Location, [address] [place] [id number] Code cpt [phone]/Reframe  Facilitate problem solving  Identify/label emotions  Validate/empathize  Emotion regulation skills  Self care activities  Lifestyle change (exercise, nutrition)  Self-monitoring  Identified an insight  Rationally challenge thoughts or beliefs/cognitive restructuring  Session notes:  F33.2  Goals: Wants to work on being genuine and being satisfied with herself and develop stronger self-esteem. Also, would like to improve her primary relationship and have her interpersonal and romantic needs met. This goal is now met, as she had ended the relationship. Wants to continue self-care and maintain weight loss. Needs to develop strategy to manage relationship with her son Sharon Cole struggles with mental health issues. Goal date 2-23. 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 Will also attempt to create a small, but satisfying social network with like-minded people. Goal date is 9-23.   Meds: Topamax, Citalopram (38m66mBuspar, Abilify 5mg 69mtient agrees to video Webex session due to the Coronavirus pandemic. She is at home and I am at my home office.   [name] has joined an onlinConservation officer, natureing to do it every couple of days. She is also doing some meditations and reading a book, "I am Enough". In addition, she is working with the job coachLeisure centre managerk a class on ExcelPsychologist, educationalsome other classes as well. She will continue to meet with the coach as she seeks employment. Has continued strong momentum. She does say that this is a tough period of life and feels that everything she is doing is for her well-being.  She talked about grieving the loss of her mother and is grieving more now than before. She feels her mom never came to terms with aging and the loss of some function.                        GUTTEMarcelina Cole  Time: 4:15p-5:00p 45 minutes.

## 2021-07-10 ENCOUNTER — Ambulatory Visit (INDEPENDENT_AMBULATORY_CARE_PROVIDER_SITE_OTHER): Payer: BLUE CROSS/BLUE SHIELD | Admitting: Psychology

## 2021-07-10 DIAGNOSIS — F324 Major depressive disorder, single episode, in partial remission: Secondary | ICD-10-CM

## 2021-07-10 NOTE — Progress Notes (Signed)
Sharon Cole is a 66 y.o. female patient  ? ?07/10/2021  ?Treatment Plan: ?Diagnosis ?296.32 (Major depressive affective disorder, recurrent episode, moderate) [n/a]  ?300.02 (Generalized anxiety disorder) [n/a]  ?Symptoms ?Depressed or irritable mood. (Status: maintained) -- No Description Entered  ?Feelings of hopelessness, worthlessness, or inappropriate guilt. (Status: maintained) -- No Description Entered  ?Lack of energy. (Status: maintained) -- No Description Entered  ?Low self-esteem. (Status: maintained) -- No Description Entered  ?Medication Status ?compliance  ?Safety ?none  ?If Suicidal or Homicidal State Action Taken: unspecified  ?Current Risk: low ?Medications ?Abilify (Dosage: .93m)  ?Buspar (Dosage: 313m  ?Citalopram (Dosage: 2074m ?Topomax (Dosage: unknown)  ?Objectives ?Related Problem: Recognize, accept, and cope with feelings of depression. ?Description: Identify and replace thoughts and beliefs that support depression. ?Target Date: 2022-01-12 ?Frequency: Daily ?Modality: individual ?Progress: 30%  ?Related Problem: Recognize, accept, and cope with feelings of depression. ?Description: Learn and implement behavioral strategies to overcome depression. ?Target Date: 2022-01-12 ?Frequency: Daily ?Modality: individual ?Progress: 60%  ?Related Problem: Recognize, accept, and cope with feelings of depression. ?Description: Verbalize an understanding and resolution of current interpersonal problems. ?Target Date: 2022-01-12 ?Frequency: Daily ?Modality: individual ?Progress: 50%  ?Related Problem: Recognize, accept, and cope with feelings of depression. ?Description: Verbalize insight into how past relationships may be influencing current experiences with depression. ?Target Date: 2022-01-12 ?Frequency: Daily ?Modality: individual ?Progress: 60% ? ?Client Response ?full compliance  ?Service Location ?Location, [address] [place] [id number] Code ?cpt [id number]?Normalize/Reframe  ?Facilitate problem solving  ?Identify/label emotions  ?Validate/empathize  ?Emotion regulation skills  ?Self care activities  ?Lifestyle change (exercise, nutrition)  ?Self-monitoring  ?Identified an insight  ?Rationally challenge thoughts or beliefs/cognitive restructuring  ?Session notes:  ?F33.2  ?Goals: Wants to work on being genuine and being satisfied with herself and develop stronger self-esteem. Also, would like to improve her primary relationship and have her interpersonal and romantic needs met. This goal is now met, as she had ended the relationship. Wants to continue self-care and maintain weight loss. Needs to develop strategy to manage relationship with her son [name] struggles with mental health issues. Goal date 2-23. 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 Will also attempt to create a small, but satisfying social network with like-minded people. Goal date is 9-23.   ?Meds: Topamax, Citalopram (62m47mBuspar, Abilify 5mg 17matient agrees to video Webex session due to the Coronavirus pandemic. She is at home and I am at my home office.  ? ?[name] iSuzi Rootsatching Brian's kids for the week. She is still managing to apply for jobs....8 in the past week. The job coach has been extremely valuable and has provided the spark [name] needed to get moving with her job searcSecretary/administrator says that the child care has been a "total distraction". She is more hopeful about the job search. She is discouraged by the pace of her weight loss. Admits that she is eating "a little too much". Is still motivated. ?She had coffee with [name]"it was fine". She stayed away from sensitive topics like whether or not he has a therapist. They talked for 1 1/2 hours and she kept it "intentionally light".                          ? ?[name]  Time: 1:15p-2:00p 45 minutes. ? ? ? ? ? ? ? ? ? ? ? ? ? ? ? ? ? ? ? ? ? ? ? ? ? ? ? ? ? ? ?

## 2021-07-17 ENCOUNTER — Ambulatory Visit (INDEPENDENT_AMBULATORY_CARE_PROVIDER_SITE_OTHER): Payer: BLUE CROSS/BLUE SHIELD | Admitting: Psychology

## 2021-07-17 DIAGNOSIS — F324 Major depressive disorder, single episode, in partial remission: Secondary | ICD-10-CM | POA: Diagnosis not present

## 2021-07-17 NOTE — Progress Notes (Signed)
? ? ?  Sharon Cole is a 66 y.o. female patient  ? ?07/17/2021  ?Treatment Plan: ?Diagnosis ?296.32 (Major depressive affective disorder, recurrent episode, moderate) [n/a]  ?300.02 (Generalized anxiety disorder) [n/a]  ?Symptoms ?Depressed or irritable mood. (Status: maintained) -- No Description Entered  ?Feelings of hopelessness, worthlessness, or inappropriate guilt. (Status: maintained) -- No Description Entered  ?Lack of energy. (Status: maintained) -- No Description Entered  ?Low self-esteem. (Status: maintained) -- No Description Entered  ?Medication Status ?compliance  ?Safety ?none  ?If Suicidal or Homicidal State Action Taken: unspecified  ?Current Risk: low ?Medications ?Abilify (Dosage: .30m)  ?Buspar (Dosage: 361m  ?Citalopram (Dosage: 2097m ?Topomax (Dosage: unknown)  ?Objectives ?Related Problem: Recognize, accept, and cope with feelings of depression. ?Description: Identify and replace thoughts and beliefs that support depression. ?Target Date: 2022-01-12 ?Frequency: Daily ?Modality: individual ?Progress: 30%  ?Related Problem: Recognize, accept, and cope with feelings of depression. ?Description: Learn and implement behavioral strategies to overcome depression. ?Target Date: 2022-01-12 ?Frequency: Daily ?Modality: individual ?Progress: 60%  ?Related Problem: Recognize, accept, and cope with feelings of depression. ?Description: Verbalize an understanding and resolution of current interpersonal problems. ?Target Date: 2022-01-12 ?Frequency: Daily ?Modality: individual ?Progress: 50%  ?Related Problem: Recognize, accept, and cope with feelings of depression. ?Description: Verbalize insight into how past relationships may be influencing current experiences with depression. ?Target Date: 2022-01-12 ?Frequency: Daily ?Modality: individual ?Progress: 60% ? ?Client Response ?full compliance  ?Service Location ?Location, [address] [place] [id number] Code ?cpt [id number]?Normalize/Reframe  ?Facilitate problem solving  ?Identify/label emotions  ?Validate/empathize  ?Emotion regulation skills  ?Self care activities  ?Lifestyle change (exercise, nutrition)  ?Self-monitoring  ?Identified an insight  ?Rationally challenge thoughts or beliefs/cognitive restructuring  ?Session notes:  ?F33.2  ?Goals: Wants to work on being genuine and being satisfied with herself and develop stronger self-esteem. Also, would like to improve her primary relationship and have her interpersonal and romantic needs met. This goal is now met, as she had ended the relationship. Wants to continue self-care and maintain weight loss. Needs to develop strategy to manage relationship with her son Sharon Browho struggles with mental health issues. Goal date 2-23. 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 Will also attempt to create a small, but satisfying social network with like-minded people. Goal date is 9-23.   ?Meds: Topamax, Citalopram (23m56mBuspar, Abilify 5mg 16matient agrees to video Webex session due to the Coronavirus pandemic. She is at home and I am at my home office.  ? ?Sharon Cole discouraged in her job hunt. She is not getting any positive responses to her new resume. Will meet with the job consultant on Thursday. May need to retarget her prospects. Will also attend support group tomorrow. We talked about her lack of social support and social network. She finds it to be a challenge to meet people that she likes. Talked about strategies to meet people and the conditions she "needs" for that to happen.                            ? ?GUTTEMarcelina Cole  Time: 4:15p-5:00p 45 minutes. ? ? ? ? ? ? ? ? ? ? ? ? ? ? ? ? ? ? ? ? ? ? ? ? ? ? ? ? ? ? ?

## 2021-07-23 ENCOUNTER — Telehealth (HOSPITAL_COMMUNITY): Payer: BLUE CROSS/BLUE SHIELD | Admitting: Psychiatry

## 2021-07-24 ENCOUNTER — Ambulatory Visit (INDEPENDENT_AMBULATORY_CARE_PROVIDER_SITE_OTHER): Payer: BLUE CROSS/BLUE SHIELD | Admitting: Psychology

## 2021-07-24 DIAGNOSIS — F324 Major depressive disorder, single episode, in partial remission: Secondary | ICD-10-CM | POA: Diagnosis not present

## 2021-07-24 NOTE — Progress Notes (Signed)
? ? ? ? ? ? ? ? ? ? ? ? ? ? ? ? ? ?Sharon Cole is a 66 y.o. female patient  ? ?07/24/2021  ?Treatment Plan: ?Diagnosis ?296.32 (Major depressive affective disorder, recurrent episode, moderate) [n/a]  ?300.02 (Generalized anxiety disorder) [n/a]  ?Symptoms ?Depressed or irritable mood. (Status: maintained) -- No Description Entered  ?Feelings of hopelessness, worthlessness, or inappropriate guilt. (Status: maintained) -- No Description Entered  ?Lack of energy. (Status: maintained) -- No Description Entered  ?Low self-esteem. (Status: maintained) -- No Description Entered  ?Medication Status ?compliance  ?Safety ?none  ?If Suicidal or Homicidal State Action Taken: unspecified  ?Current Risk: low ?Medications ?Abilify (Dosage: .59m)  ?Buspar (Dosage: 337m  ?Citalopram (Dosage: 2067m ?Topomax (Dosage: unknown)  ?Objectives ?Related Problem: Recognize, accept, and cope with feelings of depression. ?Description: Identify and replace thoughts and beliefs that support depression. ?Target Date: 2022-01-12 ?Frequency: Daily ?Modality: individual ?Progress: 30%  ?Related Problem: Recognize, accept, and cope with feelings of depression. ?Description: Learn and implement behavioral strategies to overcome depression. ?Target Date: 2022-01-12 ?Frequency: Daily ?Modality: individual ?Progress: 60%  ?Related Problem: Recognize, accept, and cope with feelings of depression. ?Description: Verbalize an understanding and resolution of current interpersonal problems. ?Target Date: 2022-01-12 ?Frequency: Daily ?Modality: individual ?Progress: 50%  ?Related Problem: Recognize, accept, and cope with feelings of depression. ?Description: Verbalize insight into how past relationships may be influencing current experiences with depression. ?Target Date: 2022-01-12 ?Frequency: Daily ?Modality: individual ?Progress: 60% ? ?Client Response ?full compliance  ?Service Location ?Location, [address] [place] [id number]?Service Code ?cpt [id number]/Reframe  ?Facilitate problem solving  ?Identify/label emotions  ?Validate/empathize  ?Emotion regulation skills  ?Self care activities  ?Lifestyle change (exercise, nutrition)  ?Self-monitoring  ?Identified an insight  ?Rationally challenge thoughts or beliefs/cognitive restructuring  ?Session notes:  ?F33.2  ?Goals: Wants to work on being genuine and being satisfied with herself and develop stronger self-esteem. Also, would like to improve her primary relationship and have her interpersonal and romantic needs met. This goal is now met, as she had ended the relationship. Wants to continue self-care and maintain weight loss. Needs to develop strategy to manage relationship with her son [name] struggles with mental health issues. Goal date 2-23. 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 Will also attempt to create a small, but satisfying social network with like-minded people. Goal date is 9-23.   ?Meds: Topamax, Citalopram (44m13mBuspar, Abilify 5mg 78matient agrees to video Webex session due to the Coronavirus pandemic. She is at home and I am at my home office.  ? ?Sharon Cole she will be taking care of the grandchildren and enjoying the time with them. She has been working on her art piece and plans to be done in about 2 weeks. She finally received all of the estate settlement. She used the majority to pay off Gary.Sharon Cole states that she is not going back to the women's group. Feels she is not getting much out of it and that the women in the group have different needs than Sharon and are "functioning on a much lower level".                             ? ?GUTTEMarcelina Cole  Time: 1:15p-2:00p 45 minutes. ? ? ? ? ? ? ? ? ? ? ? ? ? ? ? ? ? ? ? ? ? ? ? ? ? ? ? ? ? ? ?

## 2021-07-31 ENCOUNTER — Ambulatory Visit: Payer: BLUE CROSS/BLUE SHIELD | Admitting: Psychology

## 2021-08-06 ENCOUNTER — Encounter (HOSPITAL_COMMUNITY): Payer: Self-pay | Admitting: Psychiatry

## 2021-08-06 ENCOUNTER — Telehealth (HOSPITAL_BASED_OUTPATIENT_CLINIC_OR_DEPARTMENT_OTHER): Payer: BLUE CROSS/BLUE SHIELD | Admitting: Psychiatry

## 2021-08-06 DIAGNOSIS — F325 Major depressive disorder, single episode, in full remission: Secondary | ICD-10-CM

## 2021-08-06 DIAGNOSIS — F331 Major depressive disorder, recurrent, moderate: Secondary | ICD-10-CM | POA: Diagnosis not present

## 2021-08-06 DIAGNOSIS — F411 Generalized anxiety disorder: Secondary | ICD-10-CM | POA: Diagnosis not present

## 2021-08-06 MED ORDER — ARIPIPRAZOLE 5 MG PO TABS: 5.0000 mg | ORAL_TABLET | Freq: Every day | ORAL | 1 refills | Status: DC

## 2021-08-06 MED ORDER — BUSPIRONE HCL 30 MG PO TABS: 30.0000 mg | ORAL_TABLET | Freq: Two times a day (BID) | ORAL | 1 refills | Status: DC

## 2021-08-06 MED ORDER — ESCITALOPRAM OXALATE 20 MG PO TABS: 20.0000 mg | ORAL_TABLET | Freq: Every day | ORAL | 1 refills | Status: DC

## 2021-08-06 NOTE — Progress Notes (Signed)
BH MD/PA/NP OP Progress Note ? ?Sharon Cole  ?MRN:  [id number] ?05/15/2021  ? ?Chief Complaint: Medication management appointment ?HPI: This appointment was conducted via phone - Patient's identity verified.   Limitations associated with this type of communication have been reviewed.  ? ?Location of parties- ?Patient-Home ?MD-behavioral services outpatient clinic ?Duration 20 minutes ? ?66 year old female, history of anxiety/depression.  Has been diagnosed with GAD and MDD in the past .  ? ?Reports that in general she has been doing " all right " but describes some increased anxiety mainly related to employment stressors . She is now living independently and has a part time job. She babysits her grandchildren a few times a week. She states her part time job is pleasant and not stressful but simply does not pay enough for her financial needs . She has been trying to find a full time job but it has been challenging and has had difficulties due to being overqualified for certain jobs .  ?She remains hopeful that she will be able to get a job soon and is confident in her abilities . We reviewed , provided support, encouragement . She is considering attending upcoming job fairs . ? ?She reports medications have been well tolerated and helpful . She remains on same regimen and denies side effects.  ? ?Describes mood as stable in spite of above stressors. No SI.  ? ? ? ? ? ? ?Visit Diagnosis: MDD by history ?Past Psychiatric History:  ? ?Past Medical History:  ?Past Medical History:  ?Diagnosis Date  ? Anxiety   ? Bradycardia   ? Common migraine with intractable migraine 07/03/2016  ? Depression   ? Dizziness   ? Headache   ? Menopause   ? Syncope   ?  ?Past Surgical History:  ?Procedure Laterality Date  ? BACK SURGERY    ? cyst removal   ? BREAST SURGERY    ? breast reduction  ? BUNIONECTOMY    ? CHOLECYSTECTOMY    ? KNEE ARTHROSCOPY    ? ? ?Family Psychiatric History:  ? ?Family History:  ?Family History  ?Problem  Relation Age of Onset  ? Heart disease Mother   ? Heart disease Brother   ? Heart disease Maternal Grandmother   ? Heart disease Maternal Grandfather   ? Cancer Son   ?     unknown  ? ? ?Social History:  ?Social History  ? ?Socioeconomic History  ? Marital status: Significant Other  ?  Spouse name: Not on file  ? Number of children: 2  ? Years of education: Masters  ? Highest education level: Not on file  ?Occupational History  ? Not on file  ?Tobacco Use  ? Smoking status: Never  ? Smokeless tobacco: Never  ?Vaping Use  ? Vaping Use: Never used  ?Substance and Sexual Activity  ? Alcohol use: Yes  ?  Alcohol/week: 1.0 standard drink  ?  Types: 1 Glasses of wine per week  ?  Comment: daily  ? Drug use: No  ? Sexual activity: Yes  ?  Partners: Male  ?Other Topics Concern  ? Not on file  ?Social History Narrative  ? Lives  ? Caffeine use:   ? Drinks 16oz caffeine drinks a day   ? ?Social Determinants of Health  ? ?Financial Resource Strain: Not on file  ?Food Insecurity: Not on file  ?Transportation Needs: Not on file  ?Physical Activity: Not on file  ?Stress: Not on file  ?Social Connections: Not  on file  ? ? ?Allergies: No Known Allergies ? ?Metabolic Disorder Labs: ?No results found for: HGBA1C, MPG ?No results found for: PROLACTIN ?Lab Results  ?Component Value Date  ? CHOL 168 01/02/2015  ? TRIG 91.0 01/02/2015  ? HDL 53.70 01/02/2015  ? CHOLHDL 3 01/02/2015  ? VLDL 18.2 01/02/2015  ? [place] 96 01/02/2015  ? [place] 98 08/10/2013  ? ?Lab Results  ?Component Value Date  ? TSH 1.04 01/02/2015  ? TSH 2.986 08/10/2013  ? ? ?Therapeutic Level Labs: ?No results found for: LITHIUM ?No results found for: VALPROATE ?No components found for:  CBMZ ? ?Current Medications: ?Current Outpatient Medications  ?Medication Sig Dispense Refill  ? ARIPiprazole (ABILIFY) 5 MG tablet Take 1 tablet (5 mg total) by mouth daily. 30 tablet 1  ? busPIRone (BUSPAR) 30 MG tablet Take 1 tablet (30 mg total) by mouth 2 (two) times daily. 60  tablet 1  ? calcium-vitamin D (OSCAL WITH D) 250-125 MG-UNIT tablet Take 1 tablet by mouth daily.    ? escitalopram (LEXAPRO) 20 MG tablet Take 1 tablet (20 mg total) by mouth daily. 30 tablet 1  ? Multiple Vitamins-Minerals (MULTIVITAMIN PO) Take 1 tablet by mouth daily.    ? omeprazole (PRILOSEC) 10 MG capsule Take 10 mg by mouth daily.    ? Rimegepant Sulfate (NURTEC) 75 MG TBDP Take 75 mg by mouth as needed (take 1 at onset of headache, max is 1 tablet in 24 hours). 8 tablet 11  ? topiramate (TOPAMAX) 50 MG tablet Take 3 tablets (150 mg total) by mouth at bedtime. 270 tablet 3  ? ?No current facility-administered medications for this visit.  ? ? ? ? ? ?Psychiatric Specialty Exam: Please take into account limitations in obtaining a full mental status exam in the context of this mode of communication ?ROS- does not endorse  ?There were no vitals taken for this visit.There is no height or weight on file to calculate BMI.  ?General Appearance: NA  ?Eye Contact:  NA  ?Speech:  Normal Rate  ?Volume:  Normal  ?Mood: Mood stable , generally improved , presents euthymic   ?Affect: reactive, anxious regarding stressors  ?Thought Process:  Linear and Descriptions of Associations: Intact  ?Orientation:  Full (Time, Place, and Person)  ?Thought Content:  No hallucinations, no delusions    ?Suicidal Thoughts:  No no SI, future oriented, ruminative about job/financial related stressors   ?Homicidal Thoughts:  No  ?Memory:   Recent and remote grossly intact  ?Judgement:  Other:  Present  ?Insight:  Present  ?Psychomotor Activity:  NA  ?Concentration:  Concentration: Good and Attention Span: Good  ?Recall:  Good  ?Fund of Knowledge: Good  ?Language: Good  ?Akathisia:  Negative  ?Handed:  Right  ?AIMS (if indicated):   ?Assets:  Communication Skills ?Desire for Improvement ?Resilience  ?ADL's:  Intact  ?Cognition: WNL  ?Sleep:  Good  ? ?Screenings: ? ? ?Assessment and Plan:   ?66 year-old female with a history of  depression/anxiety.   ? ?Currently describes stable mood , tolerating medications well . Current medication combination has been effective and well tolerated. She does endorse some increased anxiety related to financial concerns and work related issues : currently has a part time job but does not pay enough and has been trying to get a full time job without success thus far .  ?No SI, future oriented .  ? ? ?Continue individual psychotherapy ?Will see in about 4-6  weeks , agrees to  contact clinic sooner if any worsening or medication concerns prior. ?Continue current medication regimen.  ?(Buspar 30 mgrs BID, Lexapro 20 mgrs QDAY, Abilify 5 mg QDAY ) ?Reports she has an upcoming appt with PCP-recommended she get Hgb A1C , Lipid Panel ( routine ,as on Abilify )  ? ? ? ? F Rona Tomson MD  ?Patient ID: Sharon Cole, female   DOB: Jul 30, 1955, 66 y.o.   MRN: [id number] ? ?Patient ID: SALISA BROZ, female   DOB: [birth year], 66 y.o.   MRN: [id number] ? ?

## 2021-08-06 NOTE — Progress Notes (Signed)
BH MD/PA/NP OP Progress Note ? ?Filbert Schilder Delarocha  ?MRN:  [id number] ? ? ?08/06/2021  ? ?Chief Complaint: Medication management appointment ?HPI: This appointment was conducted via phone - Patient's identity verified.   Limitations associated with this type of communication have been reviewed.  ? ?Location of parties- ?Patient-Home ?MD-behavioral services outpatient clinic ?Duration 20 minutes ? ?66 year-old female, history of anxiety/depression.  Has been diagnosed with GAD and MDD in the past .  ? ?Reports she has generally been doing well.  She describes her mood as stable and euthymic.  She denies anhedonia and reports enjoying her daily activities, primarily spending time with her grandchildren.  She continues in her current employment but states that she is in the process of trying to switch to a better pain job.  She is currently looking into a job opportunity that she is excited about and hopeful for.  Generally she is feeling optimistic and encouraged. ?Denies any suicidal ideations.  Presents future oriented.   ? ?She reports she is tolerating medications well and feels that her current med regimen is effective and well-tolerated.  Denies side effects. ? ? ? ? ? ? ? ?Visit Diagnosis: MDD by history ?Past Psychiatric History:  ? ?Past Medical History:  ?Past Medical History:  ?Diagnosis Date  ? Anxiety   ? Bradycardia   ? Common migraine with intractable migraine 07/03/2016  ? Depression   ? Dizziness   ? Headache   ? Menopause   ? Syncope   ?  ?Past Surgical History:  ?Procedure Laterality Date  ? BACK SURGERY    ? cyst removal   ? BREAST SURGERY    ? breast reduction  ? BUNIONECTOMY    ? CHOLECYSTECTOMY    ? KNEE ARTHROSCOPY    ? ? ?Family Psychiatric History:  ? ?Family History:  ?Family History  ?Problem Relation Age of Onset  ? Heart disease Mother   ? Heart disease Brother   ? Heart disease Maternal Grandmother   ? Heart disease Maternal Grandfather   ? Cancer Son   ?     unknown  ? ? ?Social History:   ?Social History  ? ?Socioeconomic History  ? Marital status: Significant Other  ?  Spouse name: Not on file  ? Number of children: 2  ? Years of education: Masters  ? Highest education level: Not on file  ?Occupational History  ? Not on file  ?Tobacco Use  ? Smoking status: Never  ? Smokeless tobacco: Never  ?Vaping Use  ? Vaping Use: Never used  ?Substance and Sexual Activity  ? Alcohol use: Yes  ?  Alcohol/week: 1.0 standard drink  ?  Types: 1 Glasses of wine per week  ?  Comment: daily  ? Drug use: No  ? Sexual activity: Yes  ?  Partners: Male  ?Other Topics Concern  ? Not on file  ?Social History Narrative  ? Lives  ? Caffeine use:   ? Drinks 16oz caffeine drinks a day   ? ?Social Determinants of Health  ? ?Financial Resource Strain: Not on file  ?Food Insecurity: Not on file  ?Transportation Needs: Not on file  ?Physical Activity: Not on file  ?Stress: Not on file  ?Social Connections: Not on file  ? ? ?Allergies: No Known Allergies ? ?Metabolic Disorder Labs: ?No results found for: HGBA1C, MPG ?No results found for: PROLACTIN ?Lab Results  ?Component Value Date  ? CHOL 168 01/02/2015  ? TRIG 91.0 01/02/2015  ? HDL 53.70  01/02/2015  ? CHOLHDL 3 01/02/2015  ? VLDL 18.2 01/02/2015  ? [place] 96 01/02/2015  ? [place] 98 08/10/2013  ? ?Lab Results  ?Component Value Date  ? TSH 1.04 01/02/2015  ? TSH 2.986 08/10/2013  ? ? ?Therapeutic Level Labs: ?No results found for: LITHIUM ?No results found for: VALPROATE ?No components found for:  CBMZ ? ?Current Medications: ?Current Outpatient Medications  ?Medication Sig Dispense Refill  ? ARIPiprazole (ABILIFY) 5 MG tablet Take 1 tablet (5 mg total) by mouth daily. 30 tablet 1  ? busPIRone (BUSPAR) 30 MG tablet Take 1 tablet (30 mg total) by mouth 2 (two) times daily. 60 tablet 1  ? calcium-vitamin D (OSCAL WITH D) 250-125 MG-UNIT tablet Take 1 tablet by mouth daily.    ? escitalopram (LEXAPRO) 20 MG tablet Take 1 tablet (20 mg total) by mouth daily. 30 tablet 1  ?  Multiple Vitamins-Minerals (MULTIVITAMIN PO) Take 1 tablet by mouth daily.    ? omeprazole (PRILOSEC) 10 MG capsule Take 10 mg by mouth daily.    ? Rimegepant Sulfate (NURTEC) 75 MG TBDP Take 75 mg by mouth as needed (take 1 at onset of headache, max is 1 tablet in 24 hours). 8 tablet 11  ? topiramate (TOPAMAX) 50 MG tablet Take 3 tablets (150 mg total) by mouth at bedtime. 270 tablet 3  ? ?No current facility-administered medications for this visit.  ? ? ? ? ? ?Psychiatric Specialty Exam: Please take into account limitations in obtaining a full mental status exam in the context of this mode of communication ?ROS- does not endorse  ?There were no vitals taken for this visit.There is no height or weight on file to calculate BMI.  ?General Appearance: NA  ?Eye Contact:  NA  ?Speech:  Normal Rate  ?Volume:  Normal  ?Mood: Reports mood as stable, presents euthymic  ?Affect: reactive, full in range  ?Thought Process:  Linear and Descriptions of Associations: Intact  ?Orientation:  Full (Time, Place, and Person)  ?Thought Content:  No hallucinations, no delusions    ?Suicidal Thoughts:  No no SI, future oriented  ?Homicidal Thoughts:  No  ?Memory:   Recent and remote grossly intact  ?Judgement:  Other:  Present  ?Insight:  Present  ?Psychomotor Activity:  NA  ?Concentration:  Concentration: Good and Attention Span: Good  ?Recall:  Good  ?Fund of Knowledge: Good  ?Language: Good  ?Akathisia:  Negative  ?Handed:  Right  ?AIMS (if indicated):   ?Assets:  Communication Skills ?Desire for Improvement ?Resilience  ?ADL's:  Intact  ?Cognition: WNL  ?Sleep:  Good  ? ?Screenings: ? ? ?Assessment and Plan:   ?66 year-old female with a history of depression/anxiety.   ? ?She reports she has been doing well and describes mood as stable.  Presents euthymic.  Does not endorse significant neurovegetative symptoms.  At this time expresses feeling hopeful and optimistic regarding possible job opportunity/upcoming interview.  Denies having  any SI and presents future oriented.  Tolerating current medication regimen well.  Denies side effects. ?She informed that she has postponed her appointment with PCP for 2 months from now.  At which time we will ask for hemoglobin A1c and lipid panel to be done. ? ? ?Continue individual psychotherapy ?Will see in about 6-8 weeks , agrees to contact clinic sooner if any worsening or medication concerns prior. ?Continue current medication regimen.  ?(Buspar 30 mgrs BID, Lexapro 20 mgrs QDAY, Abilify 5 mg QDAY ) ? ? ? ? ? F  Dorothie Wah MD  ?Patient ID: MAITE BURLISON, female   DOB: 01/02/1956, 66 y.o.   MRN: [id number] ? ? ?

## 2021-08-07 ENCOUNTER — Ambulatory Visit (INDEPENDENT_AMBULATORY_CARE_PROVIDER_SITE_OTHER): Payer: BLUE CROSS/BLUE SHIELD | Admitting: Psychology

## 2021-08-07 DIAGNOSIS — F325 Major depressive disorder, single episode, in full remission: Secondary | ICD-10-CM | POA: Diagnosis not present

## 2021-08-07 NOTE — Progress Notes (Signed)
? ? ? ? ? ? ? ? ?Sharon Cole is a 66 y.o. female patient  ? ?08/07/2021  ?Treatment Plan: ?Diagnosis ?296.32 (Major depressive affective disorder, recurrent episode, moderate) [n/a]  ?300.02 (Generalized anxiety disorder) [n/a]  ?Symptoms ?Depressed or irritable mood. (Status: maintained) -- No Description Entered  ?Feelings of hopelessness, worthlessness, or inappropriate guilt. (Status: maintained) -- No Description Entered  ?Lack of energy. (Status: maintained) -- No Description Entered  ?Low self-esteem. (Status: maintained) -- No Description Entered  ?Medication Status ?compliance  ?Safety ?none  ?If Suicidal or Homicidal State Action Taken: unspecified  ?Current Risk: low ?Medications ?Abilify (Dosage: .80m)  ?Buspar (Dosage: 370m  ?Citalopram (Dosage: 2048m ?Topomax (Dosage: unknown)  ?Objectives ?Related Problem: Recognize, accept, and cope with feelings of depression. ?Description: Identify and replace thoughts and beliefs that support depression. ?Target Date: 2022-01-12 ?Frequency: Daily ?Modality: individual ?Progress: 30%  ?Related Problem: Recognize, accept, and cope with feelings of depression. ?Description: Learn and implement behavioral strategies to overcome depression. ?Target Date: 2022-01-12 ?Frequency: Daily ?Modality: individual ?Progress: 60%  ?Related Problem: Recognize, accept, and cope with feelings of depression. ?Description: Verbalize an understanding and resolution of current interpersonal problems. ?Target Date: 2022-01-12 ?Frequency: Daily ?Modality: individual ?Progress: 50%  ?Related Problem: Recognize, accept, and cope with feelings of depression. ?Description: Verbalize insight into how past relationships may be influencing current experiences with depression. ?Target Date: 2022-01-12 ?Frequency: Daily ?Modality: individual ?Progress: 60% ? ?Client Response ?full compliance  ?Service Location ?Location, [address] [place] [id number] Code ?cpt  [id number]/Reframe  ?Facilitate problem solving  ?Identify/label emotions  ?Validate/empathize  ?Emotion regulation skills  ?Self care activities  ?Lifestyle change (exercise, nutrition)  ?Self-monitoring  ?Identified an insight  ?Rationally challenge thoughts or beliefs/cognitive restructuring  ?Session notes:  ?F33.2  ?Goals: Wants to work on being genuine and being satisfied with herself and develop stronger self-esteem. Also, would like to improve her primary relationship and have her interpersonal and romantic needs met. This goal is now met, as she had ended the relationship. Wants to continue self-care and maintain weight loss. Needs to develop strategy to manage relationship with her son Sharon Cole struggles with mental health issues. Goal date 2-23. 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 Will also attempt to create a small, but satisfying social network with like-minded people. Goal date is 9-23.   ?Meds: Topamax, Citalopram (41m70mBuspar, Abilify 5mg 44matient agrees to video Webex session due to the Coronavirus pandemic. She is at home and I am at my home office.  ? ?Sharon Cole her interview with [employer] went well and she will hear at the end of this week. She talked with the job coach and they discussed her linked in account. 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 Sports coachhe has not had a lot of contact with Sharon ColeSharon Schoonercontinues to struggle and she says he is "self-contained". Hasn't heard an update of his disability application. ?Sharon talked about her ex-husband's lack involvement with their kids, with the exception of his financial support of Sharon ColeSharon Schoonereb iSuzi Cole distressed about her weight. She has gained 40 lbs. Since November and is the heaviest she has been I a very long time. She has a plan to exercise and alter her diet. She is feeling optimistic and  hopeful that she will be able to take if off gradually and steadily.                                ? ?Sharon Morel, PhD  Time: 1:10p-2:00p 50 minutes. ? ? ? ? ? ? ? ? ? ? ? ? ? ? ? ? ? ? ? ? ? ? ? ? ? ? ? ? ? ? ?

## 2021-08-14 ENCOUNTER — Ambulatory Visit (INDEPENDENT_AMBULATORY_CARE_PROVIDER_SITE_OTHER): Payer: BLUE CROSS/BLUE SHIELD | Admitting: Psychology

## 2021-08-14 DIAGNOSIS — F325 Major depressive disorder, single episode, in full remission: Secondary | ICD-10-CM | POA: Diagnosis not present

## 2021-08-14 NOTE — Progress Notes (Signed)
Sharon Cole is a 65 y.o. female patient  ? ?08/14/2021  ?Treatment Plan: ?Diagnosis ?296.32 (Major depressive affective disorder, recurrent episode, moderate) [n/a]  ?300.02 (Generalized anxiety disorder) [n/a]  ?Symptoms ?Depressed or irritable mood. (Status: maintained) -- No Description Entered  ?Feelings of hopelessness, worthlessness, or inappropriate guilt. (Status: maintained) -- No Description Entered  ?Lack of energy. (Status: maintained) -- No Description Entered  ?Low self-esteem. (Status: maintained) -- No Description Entered  ?Medication Status ?compliance  ?Safety ?none  ?If Suicidal or Homicidal State Action Taken: unspecified  ?Current Risk: low ?Medications ?Abilify (Dosage: .25mg)  ?Buspar (Dosage: 30mg)  ?Citalopram (Dosage: 20mg)  ?Topomax (Dosage: unknown)  ?Objectives ?Related Problem: Recognize, accept, and cope with feelings of depression. ?Description: Identify and replace thoughts and beliefs that support depression. ?Target Date: 2022-01-12 ?Frequency: Daily ?Modality: individual ?Progress: 30%  ?Related Problem: Recognize, accept, and cope with feelings of depression. ?Description: Learn and implement behavioral strategies to overcome depression. ?Target Date: 2022-01-12 ?Frequency: Daily ?Modality: individual ?Progress: 60%  ?Related Problem: Recognize, accept, and cope with feelings of depression. ?Description: Verbalize an understanding and resolution of current interpersonal problems. ?Target Date: 2022-01-12 ?Frequency: Daily ?Modality: individual ?Progress: 50%  ?Related Problem: Recognize, accept, and cope with feelings of depression. ?Description: Verbalize insight into how past relationships may be influencing current experiences with depression. ?Target Date: 2022-01-12 ?Frequency: Daily ?Modality: individual ?Progress: 60% ? ?Client Response ?full compliance  ?Service Location ?Location, [address] [place], [place] [address]  ?Service Code ?cpt 90834P   ?Normalize/Reframe  ?Facilitate problem solving  ?Identify/label emotions  ?Validate/empathize  ?Emotion regulation skills  ?Self care activities  ?Lifestyle change (exercise, nutrition)  ?Self-monitoring  ?Identified an insight  ?Rationally challenge thoughts or beliefs/cognitive restructuring  ?Session notes:  ?F33.2  ?Goals: Wants to work on being genuine and being satisfied with herself and develop stronger self-esteem. Also, would like to improve her primary relationship and have her interpersonal and romantic needs met. This goal is now met, as she had ended the relationship. Wants to continue self-care and maintain weight loss. Needs to develop strategy to manage relationship with her son , who struggles with mental health issues. Goal date 2-23. 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 Will also attempt to create a small, but satisfying social network with like-minded people. Goal date is 9-23.   ?Meds: Topamax, Citalopram (20mg), Buspar, Abilify 5mg  ?Patient agrees to video Webex session due to the Coronavirus pandemic. She is at home and I am at my home office.  ? ?Sharon Cole says her kids are in [place] and so it was a very quiet week for her. She will have the kids all weekend coming up. Her son Dave called last night and said he was ready to go to the hospital. Said to her that he was very upset and "out of sorts". He was thinking something was physically wrong and he wanted to see a doctor. He told Sharon Cole that he has a therapist, but hasn't seen them yet because he needs a referral from a doctor.  ?She did not hear from [employer], but she called yesterday. Was told she was the top candidate, but they had 1 more person to interview. Is still hopeful. ?She states that [place] was not easy for her diet. She has had mixed success. She says the "job thing messes with me", and she gives herself permission to have what she wants. The weight issue keeps her feeling    insecure. She feels she cannot date until she loses weight. She is mixed about dating because she is too inhibited about her weight. We continue to try to stay on the Keto diet. Biggest struggle is the sweets. Will discuss additional strategy at next session.                                 ? ?[name], PhD  Time: 5:10p-6:00p 50 minutes. ? ? ? ? ? ? ? ? ? ? ? ? ? ? ? ? ? ? ? ? ? ? ? ? ? ? ? ? ? ? ?

## 2021-08-21 ENCOUNTER — Ambulatory Visit (INDEPENDENT_AMBULATORY_CARE_PROVIDER_SITE_OTHER): Payer: BLUE CROSS/BLUE SHIELD | Admitting: Psychology

## 2021-08-21 DIAGNOSIS — F325 Major depressive disorder, single episode, in full remission: Secondary | ICD-10-CM

## 2021-08-21 NOTE — Progress Notes (Signed)
? ? ? ? ? ? ?NEMA OATLEY is a 66 y.o. female patient  ? ?08/21/2021  ?Treatment Plan: ?Diagnosis ?296.32 (Major depressive affective disorder, recurrent episode, moderate) [n/a]  ?300.02 (Generalized anxiety disorder) [n/a]  ?Symptoms ?Depressed or irritable mood. (Status: maintained) -- No Description Entered  ?Feelings of hopelessness, worthlessness, or inappropriate guilt. (Status: maintained) -- No Description Entered  ?Lack of energy. (Status: maintained) -- No Description Entered  ?Low self-esteem. (Status: maintained) -- No Description Entered  ?Medication Status ?compliance  ?Safety ?none  ?If Suicidal or Homicidal State Action Taken: unspecified  ?Current Risk: low ?Medications ?Abilify (Dosage: .27m)  ?Buspar (Dosage: 336m  ?Citalopram (Dosage: 2064m ?Topomax (Dosage: unknown)  ?Objectives ?Related Problem: Recognize, accept, and cope with feelings of depression. ?Description: Identify and replace thoughts and beliefs that support depression. ?Target Date: 2022-01-12 ?Frequency: Daily ?Modality: individual ?Progress: 30%  ?Related Problem: Recognize, accept, and cope with feelings of depression. ?Description: Learn and implement behavioral strategies to overcome depression. ?Target Date: 2022-01-12 ?Frequency: Daily ?Modality: individual ?Progress: 60%  ?Related Problem: Recognize, accept, and cope with feelings of depression. ?Description: Verbalize an understanding and resolution of current interpersonal problems. ?Target Date: 2022-01-12 ?Frequency: Daily ?Modality: individual ?Progress: 50%  ?Related Problem: Recognize, accept, and cope with feelings of depression. ?Description: Verbalize insight into how past relationships may be influencing current experiences with depression. ?Target Date: 2022-01-12 ?Frequency: Daily ?Modality: individual ?Progress: 60% ? ?Client Response ?full compliance  ?Service Location ?Location, [address] [place] [id number] Code ?cpt [id number]?Normalize/Reframe  ?Facilitate problem solving  ?Identify/label emotions  ?Validate/empathize  ?Emotion regulation skills  ?Self care activities  ?Lifestyle change (exercise, nutrition)  ?Self-monitoring  ?Identified an insight  ?Rationally challenge thoughts or beliefs/cognitive restructuring  ?Session notes:  ?F33.2  ?Goals: Wants to work on being genuine and being satisfied with herself and develop stronger self-esteem. Also, would like to improve her primary relationship and have her interpersonal and romantic needs met. This goal is now met, as she had ended the relationship. Wants to continue self-care and maintain weight loss. Needs to develop strategy to manage relationship with her son [name] struggles with mental health issues. Goal date 12-23. 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 Will also attempt to create a small, but satisfying social network with like-minded people. Goal date is 12-23.   ?Meds: Topamax, Citalopram (73m43mBuspar, Abilify 5mg 9matient agrees to video Webex session due to the Coronavirus pandemic. She is at home and I am at my home office.  ? ?[name] had the grandchildren all weekend. [place] [employer] [place]. She has a third interview for a job this afternoon. She is thinking she must be in close competition with another person for the job. She met with job coach in preparation of the interview. She has taken a break from the job hunt, waiting to see what happens with the interview. 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 Her motivation has been challenged. There is some worry that the buyer, who wanted to be surprised, may not like it. This is [name]'s insecurity taking over. Told her it is time to set a date for buyer to come get the piece. It will force her to complete the project.  ?She is frustrated because she cannot make any progress with her weight loss. She says she makes some progress and then undoes the  progress.Makes some progress  but not sustaining it. Her "extra weight" is causing her considerable upset. Feels getting a job with be an asset to her weight loss                                   ? ?[name], PhD  Time: 1:10p-2:00p 50 minutes. ? ? ? ? ? ? ? ? ? ? ? ? ? ? ? ? ? ? ? ? ? ? ? ? ? ? ? ? ? ? ?

## 2021-08-28 ENCOUNTER — Ambulatory Visit (INDEPENDENT_AMBULATORY_CARE_PROVIDER_SITE_OTHER): Payer: BLUE CROSS/BLUE SHIELD | Admitting: Psychology

## 2021-08-28 DIAGNOSIS — F325 Major depressive disorder, single episode, in full remission: Secondary | ICD-10-CM

## 2021-08-28 NOTE — Progress Notes (Signed)
? ? ? ? ? ? ? ? ? ? ? ? ? ?  Sharon Cole is a 66 y.o. female patient  ? ?08/28/2021  ?Treatment Plan: ?Diagnosis ?296.32 (Major depressive affective disorder, recurrent episode, moderate) [n/a]  ?300.02 (Generalized anxiety disorder) [n/a]  ?Symptoms ?Depressed or irritable mood. (Status: maintained) -- No Description Entered  ?Feelings of hopelessness, worthlessness, or inappropriate guilt. (Status: maintained) -- No Description Entered  ?Lack of energy. (Status: maintained) -- No Description Entered  ?Low self-esteem. (Status: maintained) -- No Description Entered  ?Medication Status ?compliance  ?Safety ?none  ?If Suicidal or Homicidal State Action Taken: unspecified  ?Current Risk: low ?Medications ?Abilify (Dosage: .46m)  ?Buspar (Dosage: 377m  ?Citalopram (Dosage: 2037m ?Topomax (Dosage: unknown)  ?Objectives ?Related Problem: Recognize, accept, and cope with feelings of depression. ?Description: Identify and replace thoughts and beliefs that support depression. ?Target Date: 2022-01-12 ?Frequency: Daily ?Modality: individual ?Progress: 30%  ?Related Problem: Recognize, accept, and cope with feelings of depression. ?Description: Learn and implement behavioral strategies to overcome depression. ?Target Date: 2022-01-12 ?Frequency: Daily ?Modality: individual ?Progress: 60%  ?Related Problem: Recognize, accept, and cope with feelings of depression. ?Description: Verbalize an understanding and resolution of current interpersonal problems. ?Target Date: 2022-01-12 ?Frequency: Daily ?Modality: individual ?Progress: 50%  ?Related Problem: Recognize, accept, and cope with feelings of depression. ?Description: Verbalize insight into how past relationships may be influencing current experiences with depression. ?Target Date: 2022-01-12 ?Frequency: Daily ?Modality: individual ?Progress: 60% ? ?Client Response ?full compliance  ?Service Location ?Location, [address] [place] [id number]  Code ?cpt [id number]/Reframe  ?Facilitate problem solving  ?Identify/label emotions  ?Validate/empathize  ?Emotion regulation skills  ?Self care activities  ?Lifestyle change (exercise, nutrition)  ?Self-monitoring  ?Identified an insight  ?Rationally challenge thoughts or beliefs/cognitive restructuring  ?Session notes:  ?F33.2  ?Goals: Wants to work on being genuine and being satisfied with herself and develop stronger self-esteem. Also, would like to improve her primary relationship and have her interpersonal and romantic needs met. This goal is now met, as she had ended the relationship. Wants to continue self-care and maintain weight loss. Needs to develop strategy to manage relationship with her son [name] struggles with mental health issues. Goal date 12-23. 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 Will also attempt to create a small, but satisfying social network with like-minded people. Goal date is 12-23.   ?Meds: Topamax, Citalopram (37m53mBuspar, Abilify 5mg 59matient agrees to video Webex session due to the Coronavirus pandemic. She is at home and I am at my home office.  ? ?[name] gSuzi Rootsthe job with [employer] and she starts May 8th. She is very relieved and this will greatly reduce her anxiety. She still says that she has some emotional challenges in that she does not have a lot of confidence. It will also be a physical adjustment for her. 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 No progress with Dave.[name]                                 ? ?[name]  Time: 4:10p-5:00p 50 minutes. ? ? ? ? ? ? ? ? ? ? ? ? ? ? ? ? ? ? ? ? ? ? ? ? ? ? ? ? ? ? ?

## 2021-09-04 ENCOUNTER — Ambulatory Visit: Payer: Self-pay | Admitting: Psychology

## 2021-09-07 ENCOUNTER — Ambulatory Visit (INDEPENDENT_AMBULATORY_CARE_PROVIDER_SITE_OTHER): Payer: Self-pay | Admitting: Psychology

## 2021-09-07 DIAGNOSIS — F325 Major depressive disorder, single episode, in full remission: Secondary | ICD-10-CM

## 2021-09-07 NOTE — Progress Notes (Signed)
? ? ? ? ? ? ? ? ? ? ? ? ? ? ? ? ? ? ? ? ? ? ? ? ? ? ? ? ?Sharon Cole is a 66 y.o. female patient  ? ?09/07/2021  ?Treatment Plan: ?Diagnosis ?296.32 (Major depressive affective disorder, recurrent episode, moderate) [n/a]  ?300.02 (Generalized anxiety disorder) [n/a]  ?Symptoms ?Depressed or irritable mood. (Status: maintained) -- No Description Entered  ?Feelings of hopelessness, worthlessness, or inappropriate guilt. (Status: maintained) -- No Description Entered  ?Lack of energy. (Status: maintained) -- No Description Entered  ?Low self-esteem. (Status: maintained) -- No Description Entered  ?Medication Status ?compliance  ?Safety ?none  ?If Suicidal or Homicidal State Action Taken: unspecified  ?Current Risk: low ?Medications ?Abilify (Dosage: .70m)  ?Buspar (Dosage: 332m  ?Citalopram (Dosage: 2075m ?Topomax (Dosage: unknown)  ?Objectives ?Related Problem: Recognize, accept, and cope with feelings of depression. ?Description: Identify and replace thoughts and beliefs that support depression. ?Target Date: 2022-01-12 ?Frequency: Daily ?Modality: individual ?Progress: 30%  ?Related Problem: Recognize, accept, and cope with feelings of depression. ?Description: Learn and implement behavioral strategies to overcome depression. ?Target Date: 2022-01-12 ?Frequency: Daily ?Modality: individual ?Progress: 60%  ?Related Problem: Recognize, accept, and cope with feelings of depression. ?Description: Verbalize an understanding and resolution of current interpersonal problems. ?Target Date: 2022-01-12 ?Frequency: Daily ?Modality: individual ?Progress: 50%  ?Related Problem: Recognize, accept, and cope with feelings of depression. ?Description: Verbalize insight into how past relationships may be influencing current experiences with depression. ?Target Date: 2022-01-12 ?Frequency: Daily ?Modality: individual ?Progress: 60% ? ?Client Response ?full compliance  ?Service Location ?Location, [address]  [place] [id number] Code ?cpt [id number]/Reframe  ?Facilitate problem solving  ?Identify/label emotions  ?Validate/empathize  ?Emotion regulation skills  ?Self care activities  ?Lifestyle change (exercise, nutrition)  ?Self-monitoring  ?Identified an insight  ?Rationally challenge thoughts or beliefs/cognitive restructuring  ?Session notes:  ?F33.2  ?Goals: Wants to work on being genuine and being satisfied with herself and develop stronger self-esteem. Also, would like to improve her primary relationship and have her interpersonal and romantic needs met. This goal is now met, as she had ended the relationship. Wants to continue self-care and maintain weight loss. Needs to develop strategy to manage relationship with her son Sharon Cole struggles with mental health issues. Goal date 12-23. 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 Will also attempt to create a small, but satisfying social network with like-minded people. Goal date is 12-23.   ?Meds: Topamax, Citalopram (72m22mBuspar, Abilify 5mg 60matient agrees to video Webex session due to the Coronavirus pandemic. She is at home and I am at my home office.  ? ?Sharon Cole had her last day of work today and starts her new job on Monday. 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 She is very excited that she got paid more than her asking price. The woman also asked Sharon Cole to do another piece for her. This has motivated Sharon Cole to finish two of her unfinished pieces.  ?DavidShanon Cole gone "semi-dark" in that he is not communicating with Sharon Cole or his father. He did tell Sharon Cole tSuzi Cole he is starting with a new therapist this Wednesday.  ?She says she hasn't seen her sibs in a long time. She says she is isolated from others but is fine with it.                                         ? ?  Sharon Morel, PhD  Time: 12:15p-1:00p 45 minutes. ? ? ? ? ? ? ? ? ? ? ? ? ? ? ? ? ? ? ? ? ? ? ? ? ? ? ? ? ? ? ?

## 2021-09-11 ENCOUNTER — Ambulatory Visit (INDEPENDENT_AMBULATORY_CARE_PROVIDER_SITE_OTHER): Payer: Self-pay | Admitting: Psychology

## 2021-09-11 DIAGNOSIS — F325 Major depressive disorder, single episode, in full remission: Secondary | ICD-10-CM

## 2021-09-11 NOTE — Progress Notes (Signed)
? ? ? ? ? ? ? ? ? ? ? ? ? ? ? ? ? ? ? ? ? ? ? ? ? ? ? ? ? ? ? ? ? ? ? ? ? ? ? ? ? ? ? ?Sharon Cole Cole is a 66 y.o. female patient  ? ?09/11/2021  ?Treatment Plan: ?Diagnosis ?296.32 (Major depressive affective disorder, recurrent episode, moderate) [n/a]  ?300.02 (Generalized anxiety disorder) [n/a]  ?Symptoms ?Depressed or irritable mood. (Status: maintained) -- No Description Entered  ?Feelings of hopelessness, worthlessness, or inappropriate guilt. (Status: maintained) -- No Description Entered  ?Lack of energy. (Status: maintained) -- No Description Entered  ?Low self-esteem. (Status: maintained) -- No Description Entered  ?Medication Status ?compliance  ?Safety ?none  ?If Suicidal or Homicidal State Action Taken: unspecified  ?Current Risk: low ?Medications ?Abilify (Dosage: .67m)  ?Buspar (Dosage: 332m  ?Citalopram (Dosage: 2089m ?Topomax (Dosage: unknown)  ?Objectives ?Related Problem: Recognize, accept, and cope with feelings of depression. ?Description: Identify and replace thoughts and beliefs that support depression. ?Target Date: 2022-01-12 ?Frequency: Daily ?Modality: individual ?Progress: 30%  ?Related Problem: Recognize, accept, and cope with feelings of depression. ?Description: Learn and implement behavioral strategies to overcome depression. ?Target Date: 2022-01-12 ?Frequency: Daily ?Modality: individual ?Progress: 60%  ?Related Problem: Recognize, accept, and cope with feelings of depression. ?Description: Verbalize an understanding and resolution of current interpersonal problems. ?Target Date: 2022-01-12 ?Frequency: Daily ?Modality: individual ?Progress: 50%  ?Related Problem: Recognize, accept, and cope with feelings of depression. ?Description: Verbalize insight into how past relationships may be influencing current experiences with depression. ?Target Date: 2022-01-12 ?Frequency: Daily ?Modality: individual ?Progress: 60% ? ?Client Response ?full compliance  ?Service Location ?Location,  [address] [place] [id number] Code ?cpt [id number]/Reframe  ?Facilitate problem solving  ?Identify/label emotions  ?Validate/empathize  ?Emotion regulation skills  ?Self care activities  ?Lifestyle change (exercise, nutrition)  ?Self-monitoring  ?Identified an insight  ?Rationally challenge thoughts or beliefs/cognitive restructuring  ?Session notes:  ?F33.2  ?Goals: Wants to work on being genuine and being satisfied with herself and develop stronger self-esteem. Also, would like to improve her primary relationship and have her interpersonal and romantic needs met. This goal is now met, as she had ended the relationship. Wants to continue self-care and maintain weight loss. Needs to develop strategy to manage relationship with her son Sharon Cole Browho struggles with mental health issues. Goal date 12-23. 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 Will also attempt to create a small, but satisfying social network with like-minded people. Goal date is 12-23.   ?Meds: Topamax, Citalopram (33m53mBuspar, Abilify 5mg 64matient agrees to video Webex session due to the Coronavirus pandemic. She is at home and I am at my home office.  ? 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 Says that everyone at work seems very nice so far. She says that her eating is under better control this past week. She thinks it is the peace of mind about know the new job was on the horizon. She has lost a few pounds and it makes her feel in more control. She says "all of it was stress eating". Her son DavidShanon Browtill not communicating, but has first therapy session with new therapist tomorrow. Her ex-husband called her to tell her that he has prostate cancer. Not sure of the treatment at this point.                                             ? ?  Sharon Cole Morel, PhD  Time: 4:15p-5:00p 45 minutes. ? ? ? ? ? ? ? ? ? ? ? ? ? ? ? ? ? ? ? ? ? ? ? ? ? ? ? ? ? ? ?

## 2021-09-18 ENCOUNTER — Ambulatory Visit: Payer: Self-pay | Admitting: Psychology

## 2021-09-25 ENCOUNTER — Ambulatory Visit (INDEPENDENT_AMBULATORY_CARE_PROVIDER_SITE_OTHER): Payer: Self-pay | Admitting: Psychology

## 2021-09-25 DIAGNOSIS — F325 Major depressive disorder, single episode, in full remission: Secondary | ICD-10-CM

## 2021-09-25 NOTE — Progress Notes (Signed)
Sharon Cole Cole is a 66 y.o. female patient   09/25/2021  Treatment Plan: Diagnosis 296.32 (Major depressive affective disorder, recurrent episode, moderate) [n/a]  300.02 (Generalized anxiety disorder) [n/a]  Symptoms Depressed or irritable mood. (Status: maintained) -- No Description Entered  Feelings of hopelessness, worthlessness, or inappropriate guilt. (Status: maintained) -- No Description Entered  Lack of energy. (Status: maintained) -- No Description Entered  Low self-esteem. (Status: maintained) -- No Description Entered  Medication Status compliance  Safety none  If Suicidal or Homicidal State Action Taken: unspecified  Current Risk: low Medications Abilify (Dosage: .21m)  Buspar (Dosage: 381m  Citalopram (Dosage: 2072m Topomax (Dosage: unknown)  Objectives Related Problem: Recognize, accept, and cope with feelings of depression. Description: Identify and replace thoughts and beliefs that support depression. Target Date: 2022-01-12 Frequency: Daily Modality: individual Progress: 30%  Related Problem: Recognize, accept, and cope with feelings of depression. Description: Learn and implement behavioral strategies to overcome depression. Target Date: 2022-01-12 Frequency: Daily Modality: individual Progress: 60%  Related Problem: Recognize, accept, and cope with feelings of depression. Description: Verbalize an understanding and resolution of current interpersonal problems. Target Date: 2022-01-12 Frequency: Daily Modality: individual Progress: 50%  Related Problem: Recognize, accept, and cope with feelings of depression. Description: Verbalize insight into how past relationships may be influencing current experiences with depression. Target Date: 2022-01-12 Frequency: Daily Modality: individual Progress: 60%  Client Response full  compliance  Service Location Location, [address] [place] [id number] Code cpt [phone]/Reframe  Facilitate problem solving  Identify/label emotions  Validate/empathize  Emotion regulation skills  Self care activities  Lifestyle change (exercise, nutrition)  Self-monitoring  Identified an insight  Rationally challenge thoughts or beliefs/cognitive restructuring  Session notes:  F33.2  Goals: Wants to work on being genuine and being satisfied with herself and develop stronger self-esteem. Also, would like to improve her primary relationship and have her interpersonal and romantic needs met. This goal is now met, as she had ended the relationship. Wants to continue self-care and maintain weight loss. Needs to develop strategy to manage relationship with her son [name] struggles with mental health issues. Goal date 12-23. 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 Will also attempt to create a small, but satisfying social network with like-minded people. Goal date is 12-23.   Meds: Topamax, Citalopram (98m78mBuspar, Abilify 5mg 25mtient agrees to video Webex session due to the Coronavirus pandemic. She is at home and I am at my home office.   Sharon Cole i[employer]i [employer]sff work today. Says the job is going well and she is now done with the computer training. She states the work environment is very good and she is pleased with the culture of the organization.  Son Sharon Waunita Schoonerinues to be in a bad place. He had a appointment with a new therapist. He told Sharon Cole it was encouraging, having had 2 appointments. She is hopeful this will be helpful in getting him out of his depression. Sharon Cole has never felt she "fit in" socially, but is willing to "give it a try". She is very isolated and  longs for interpersonal interaction. We talked options and she will start to explore.                                                 Sharon Cole Morel, PhD  Time:  4:15p-5:00p 45 minutes.

## 2021-10-02 ENCOUNTER — Ambulatory Visit: Payer: Self-pay | Admitting: Psychology

## 2021-10-09 ENCOUNTER — Ambulatory Visit (INDEPENDENT_AMBULATORY_CARE_PROVIDER_SITE_OTHER): Payer: Medicare Other | Admitting: Psychology

## 2021-10-09 DIAGNOSIS — F325 Major depressive disorder, single episode, in full remission: Secondary | ICD-10-CM

## 2021-10-09 NOTE — Progress Notes (Signed)
Sharon Cole is a 66 y.o. female patient   10/09/2021  Treatment Plan: Diagnosis 296.32 (Major depressive affective disorder, recurrent episode, moderate) [n/a]  300.02 (Generalized anxiety disorder) [n/a]  Symptoms Depressed or irritable mood. (Status: maintained) -- No Description Entered  Feelings of hopelessness, worthlessness, or inappropriate guilt. (Status: maintained) -- No Description Entered  Lack of energy. (Status: maintained) -- No Description Entered  Low self-esteem. (Status: maintained) -- No Description Entered  Medication Status compliance  Safety none  If Suicidal or Homicidal State Action Taken: unspecified  Current Risk: low Medications Abilify (Dosage: .16m)  Buspar (Dosage: 371m  Citalopram (Dosage: 2024m Topomax (Dosage: unknown)  Objectives Related Problem: Recognize, accept, and cope with feelings of depression. Description: Identify and replace thoughts and beliefs that support depression. Target Date: 2022-01-12 Frequency: Daily Modality: individual Progress: 30%  Related Problem: Recognize, accept, and cope with feelings of depression. Description: Learn and implement behavioral strategies to overcome depression. Target Date: 2022-01-12 Frequency: Daily Modality: individual Progress: 60%  Related Problem: Recognize, accept, and cope with feelings of depression. Description: Verbalize an understanding and resolution of current interpersonal problems. Target Date: 2022-01-12 Frequency: Daily Modality: individual Progress: 50%  Related Problem: Recognize, accept, and cope with feelings of depression. Description: Verbalize insight into how past relationships may be influencing current experiences with depression. Target Date: 2022-01-12 Frequency: Daily Modality: individual Progress:  60%  Client Response full compliance  Service Location Location, [address] [place] [id number] Code cpt [id number]/Reframe  Facilitate problem solving  Identify/label emotions  Validate/empathize  Emotion regulation skills  Self care activities  Lifestyle change (exercise, nutrition)  Self-monitoring  Identified an insight  Rationally challenge thoughts or beliefs/cognitive restructuring  Session notes:  F33.2  Goals: Wants to work on being genuine and being satisfied with herself and develop stronger self-esteem. Also, would like to improve her primary relationship and have her interpersonal and romantic needs met. This goal is now met, as she had ended the relationship. Wants to continue self-care and maintain weight loss. Needs to develop strategy to manage relationship with her son Sharon Cole struggles with mental health issues. Goal date 12-23. 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 Will also attempt to create a small, but satisfying social network with like-minded people. Goal date is 12-23.   Meds: Topamax, Citalopram (53m93mBuspar, Abilify 5mg 53mtient agrees to video Webex session due to the Coronavirus pandemic. She is at home and I am at my home office.   Sharon Cole says she is coming up on 1 month at work and it is going well. She likes the [employer] culture of the company. She says that the hours/schedule is manageable and she is getting used to the new hours. Has not yet determined how to balance life with work, but is invested in making that happen. 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 She states that she is very consumed with the work situation, even though the work itself is not  the most stimulationg. She does say that her moods and self esteem are unchanged in that she is not "overly excited about anything". She further states that "it has been a long time since she felt good". Sharon Cole feels  like "there will always be something missing". Whatever "it" is, she thinks it will always be that way....like she never belonged. This feeling is pervasive both personally and professionally. She remains overly focused on losing weight and has not been successful. Feels she needs to address this issue first before working on her other isues of self.                                                         Sharon Morel, PhD  Time: 4:15p-5:00p 45 minutes.

## 2021-10-15 ENCOUNTER — Encounter (HOSPITAL_COMMUNITY): Payer: Self-pay | Admitting: Psychiatry

## 2021-10-15 ENCOUNTER — Telehealth (HOSPITAL_BASED_OUTPATIENT_CLINIC_OR_DEPARTMENT_OTHER): Payer: Medicare Other | Admitting: Psychiatry

## 2021-10-15 DIAGNOSIS — F411 Generalized anxiety disorder: Secondary | ICD-10-CM

## 2021-10-15 DIAGNOSIS — F331 Major depressive disorder, recurrent, moderate: Secondary | ICD-10-CM | POA: Diagnosis not present

## 2021-10-15 DIAGNOSIS — F325 Major depressive disorder, single episode, in full remission: Secondary | ICD-10-CM

## 2021-10-15 MED ORDER — BUSPIRONE HCL 30 MG PO TABS: 30.0000 mg | ORAL_TABLET | Freq: Two times a day (BID) | ORAL | 1 refills | Status: DC

## 2021-10-15 MED ORDER — ARIPIPRAZOLE 5 MG PO TABS: 5.0000 mg | ORAL_TABLET | Freq: Every day | ORAL | 1 refills | Status: DC

## 2021-10-15 MED ORDER — ESCITALOPRAM OXALATE 20 MG PO TABS: 20.0000 mg | ORAL_TABLET | Freq: Every day | ORAL | 1 refills | Status: DC

## 2021-10-15 NOTE — Progress Notes (Signed)
[facility] MD/PA/NP OP Progress Note  Sharon Cole  MRN:  [id number]   10/15/2021  Chief Complaint: Medication management appointment HPI: This appointment was conducted via phone - Patient's identity verified.   Limitations associated with this type of communication have been reviewed.   Location of parties- Patient-Home MD-behavioral services outpatient clinic Duration 20 minutes  66 year-old female, history of anxiety/depression.  Has been diagnosed with GAD and MDD in the past .   Reports she is currently doing well and functioning well in daily activities .  Mood described as stable and euthymic at this time. Anxiety symptoms have also improved . She states she got the job she had been applying for and thus far it has been going well. States she feels comfortable at work . She is paid more than on prior employment so anxiety about financial issues has also decreased . She does state that because she is now working full time has less time to spend with her grandchildren .  Currently does not endorse significant neuro-vegetative symptoms or anhedonia. No SI/future oriented.   Denies medication side effects. Side effects reviewed . Does not endorse abnormal or involuntary movements .   She reports  has an upcoming appointment with PCP, at which time she will have routine blood work done         Visit Diagnosis: MDD by history Past Psychiatric History:   Past Medical History:  Past Medical History:  Diagnosis Date   Anxiety    Bradycardia    Common migraine with intractable migraine 07/03/2016   Depression    Dizziness    Headache    Menopause    Syncope     Past Surgical History:  Procedure Laterality Date   BACK SURGERY     cyst removal    BREAST SURGERY     breast reduction   BUNIONECTOMY     CHOLECYSTECTOMY     KNEE ARTHROSCOPY      Family Psychiatric History:   Family History:  Family History  Problem Relation Age of Onset   Heart disease Mother     Heart disease Brother    Heart disease Maternal Grandmother    Heart disease Maternal Grandfather    Cancer Son        unknown    Social History:  Social History   Socioeconomic History   Marital status: Significant Other    Spouse name: Not on file   Number of children: 2   Years of education: Masters   Highest education level: Not on file  Occupational History   Not on file  Tobacco Use   Smoking status: Never   Smokeless tobacco: Never  Vaping Use   Vaping Use: Never used  Substance and Sexual Activity   Alcohol use: Yes    Alcohol/week: 1.0 standard drink of alcohol    Types: 1 Glasses of wine per week    Comment: daily   Drug use: No   Sexual activity: Yes    Partners: Male  Other Topics Concern   Not on file  Social History Narrative   Lives   Caffeine use:    Drinks 16oz caffeine drinks a day    Social Determinants of Radio broadcast assistant Strain: Not on file  Food Insecurity: Not on file  Transportation Needs: Not on file  Physical Activity: Not on file  Stress: Not on file  Social Connections: Not on file    Allergies: No Known Allergies  Metabolic Disorder  Labs: No results found for: "HGBA1C", "MPG" No results found for: "PROLACTIN" Lab Results  Component Value Date   CHOL 168 01/02/2015   TRIG 91.0 01/02/2015   HDL 53.70 01/02/2015   CHOLHDL 3 01/02/2015   VLDL 18.2 01/02/2015   LDLCALC 96 01/02/2015   LDLCALC 98 08/10/2013   Lab Results  Component Value Date   TSH 1.04 01/02/2015   TSH 2.986 08/10/2013    Therapeutic Level Labs: No results found for: "LITHIUM" No results found for: "VALPROATE" No results found for: "CBMZ"  Current Medications: Current Outpatient Medications  Medication Sig Dispense Refill   ARIPiprazole (ABILIFY) 5 MG tablet Take 1 tablet (5 mg total) by mouth daily. 30 tablet 1   busPIRone (BUSPAR) 30 MG tablet Take 1 tablet (30 mg total) by mouth 2 (two) times daily. 60 tablet 1   calcium-vitamin D  (OSCAL WITH D) 250-125 MG-UNIT tablet Take 1 tablet by mouth daily.     escitalopram (LEXAPRO) 20 MG tablet Take 1 tablet (20 mg total) by mouth daily. 30 tablet 1   Multiple Vitamins-Minerals (MULTIVITAMIN PO) Take 1 tablet by mouth daily.     omeprazole (PRILOSEC) 10 MG capsule Take 10 mg by mouth daily.     Rimegepant Sulfate (NURTEC) 75 MG TBDP Take 75 mg by mouth as needed (take 1 at onset of headache, max is 1 tablet in 24 hours). 8 tablet 11   topiramate (TOPAMAX) 50 MG tablet Take 3 tablets (150 mg total) by mouth at bedtime. 270 tablet 3   No current facility-administered medications for this visit.       Psychiatric Specialty Exam: Please take into account limitations in obtaining a full mental status exam in the context of this mode of communication ROS- does not endorse  There were no vitals taken for this visit.There is no height or weight on file to calculate BMI.  General Appearance: NA  Eye Contact:  NA  Speech:  Normal Rate  Volume:  Normal  Mood: Reports mood as stable, presents euthymic  Affect: reactive, full in range  Thought Process:  Linear and Descriptions of Associations: Intact  Orientation:  Full (Time, Place, and Person)  Thought Content:  No hallucinations, no delusions    Suicidal Thoughts:  No no SI, future oriented  Homicidal Thoughts:  No  Memory:   Recent and remote grossly intact  Judgement:  Other:  Present  Insight:  Present  Psychomotor Activity:  NA  Concentration:  Concentration: Good and Attention Span: Good  Recall:  Good  Fund of Knowledge: Good  Language: Good  Akathisia:  Negative  Handed:  Right  AIMS (if indicated):   Assets:  Communication Skills Desire for Improvement Resilience  ADL's:  Intact  Cognition: WNL  Sleep:  Good   Screenings:   Assessment and Plan:   66 year-old female with a history of depression/anxiety.    She reports stable mood and presents euthymic.  No significant anhedonia/ future oriented .  Anxiety symptoms/worry also described as improved . 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   Continue individual psychotherapy with Dr. Cheryln Manly  Will see in about 8 weeks , agrees to contact clinic sooner if any worsening or medication concerns prior. Continue current medication regimen.  (Buspar 30 mgrs BID, Lexapro 20 mgrs QDAY, Abilify 5 mg QDAY )  Gabriel Earing MD  Patient ID: Sharon Cole, female   DOB: [birth year], 66 y.o.   MRN: [id number]

## 2021-10-16 ENCOUNTER — Ambulatory Visit: Payer: BLUE CROSS/BLUE SHIELD | Admitting: Psychology

## 2021-10-23 ENCOUNTER — Ambulatory Visit (INDEPENDENT_AMBULATORY_CARE_PROVIDER_SITE_OTHER): Payer: Medicare Other | Admitting: Psychology

## 2021-10-23 DIAGNOSIS — F325 Major depressive disorder, single episode, in full remission: Secondary | ICD-10-CM

## 2021-10-23 NOTE — Progress Notes (Signed)
Sharon Cole is a 66 y.o. female patient   10/23/2021  Treatment Plan: Diagnosis 296.32 (Major depressive affective disorder, recurrent episode, moderate) [n/a]  300.02 (Generalized anxiety disorder) [n/a]  Symptoms Depressed or irritable mood. (Status: maintained) -- No Description Entered  Feelings of hopelessness, worthlessness, or inappropriate guilt. (Status: maintained) -- No Description Entered  Lack of energy. (Status: maintained) -- No Description Entered  Low self-esteem. (Status: maintained) -- No Description Entered  Medication Status compliance  Safety none  If Suicidal or Homicidal State Action Taken: unspecified  Current Risk: low Medications Abilify (Dosage: .79m)  Buspar (Dosage: 343m  Citalopram (Dosage: 2074m Topomax (Dosage: unknown)  Objectives Related Problem: Recognize, accept, and cope with feelings of depression. Description: Identify and replace thoughts and beliefs that support depression. Target Date: 2022-01-12 Frequency: Daily Modality: individual Progress: 30%  Related Problem: Recognize, accept, and cope with feelings of depression. Description: Learn and implement behavioral strategies to overcome depression. Target Date: 2022-01-12 Frequency: Daily Modality: individual Progress: 60%  Related Problem: Recognize, accept, and cope with feelings of depression. Description: Verbalize an understanding and resolution of current interpersonal problems. Target Date: 2022-01-12 Frequency: Daily Modality: individual Progress: 50%  Related Problem: Recognize, accept, and cope with feelings of depression. Description: Verbalize insight into how past relationships may be influencing current experiences with depression. Target Date: 2022-01-12 Frequency: Daily Modality:  individual Progress: 60%  Client Response full compliance  Service Location Location, [address] [place] [id number] Code cpt [id number]/Reframe  Facilitate problem solving  Identify/label emotions  Validate/empathize  Emotion regulation skills  Self care activities  Lifestyle change (exercise, nutrition)  Self-monitoring  Identified an insight  Rationally challenge thoughts or beliefs/cognitive restructuring  Session notes:  F33.2  Goals: Wants to work on being genuine and being satisfied with herself and develop stronger self-esteem. Also, would like to improve her primary relationship and have her interpersonal and romantic needs met. This goal is now met, as she had ended the relationship. Wants to continue self-care and maintain weight loss. Needs to develop strategy to manage relationship with her son Sharon Cole struggles with mental health issues. Goal date 12-23. 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 Will also attempt to create a small, but satisfying social network with like-minded people. Goal date is 12-23.   Meds: Topamax, Citalopram (54m54mBuspar, Abilify 5mg 28mtient agrees to video Webex session due to the Coronavirus pandemic. She is at home and I am at my home office.   [name] that it was her birthday this past week. Went to brunch with son and daughter in law. Sports coachSunday, celebrated Father's Day with son Sharon Edelmanbrother Gary Dominica Severinfamily. DavidShanon Brownot attend either celebration. [name] she is "status quo" and still adjusting to her new job. When not working, she has been spending time with her kids/grand kids. She says the grand kids are going to  camp, so she can have some free time when she gets off work. Her plan is to re-engage with her art. She has a goal of saving for a trip to [place] or [place]. She says she is NOT having any relationship envy. Thinks her weight has been a buffer/obstacle to  dating. She will explore reasonable diet options for [place] to discuss and any additional lifestyle changes.                                                               [name], PhD  Time: 4:15p-5:00p 45 minutes.

## 2021-10-30 ENCOUNTER — Ambulatory Visit (INDEPENDENT_AMBULATORY_CARE_PROVIDER_SITE_OTHER): Payer: Medicare Other | Admitting: Psychology

## 2021-10-30 DIAGNOSIS — F325 Major depressive disorder, single episode, in full remission: Secondary | ICD-10-CM | POA: Diagnosis not present

## 2021-10-30 NOTE — Progress Notes (Addendum)
Sharon Cole is a 65 y.o. female patient   10/30/2021  Treatment Plan: Diagnosis 296.32 (Major depressive affective disorder, recurrent episode, moderate) [n/a]  300.02 (Generalized anxiety disorder) [n/a]  Symptoms Depressed or irritable mood. (Status: maintained) -- No Description Entered  Feelings of hopelessness, worthlessness, or inappropriate guilt. (Status: maintained) -- No Description Entered  Lack of energy. (Status: maintained) -- No Description Entered  Low self-esteem. (Status: maintained) -- No Description Entered  Medication Status compliance  Safety none  If Suicidal or Homicidal State Action Taken: unspecified  Current Risk: low Medications Abilify (Dosage: .11m)  Buspar (Dosage: 337m  Citalopram (Dosage: 2043m Topomax (Dosage: unknown)  Objectives Related Problem: Recognize, accept, and cope with feelings of depression. Description: Identify and replace thoughts and beliefs that support depression. Target Date: 2022-01-12 Frequency: Daily Modality: individual Progress: 30%  Related Problem: Recognize, accept, and cope with feelings of depression. Description: Learn and implement behavioral strategies to overcome depression. Target Date: 2022-01-12 Frequency: Daily Modality: individual Progress: 60%  Related Problem: Recognize, accept, and cope with feelings of depression. Description: Verbalize an understanding and resolution of current interpersonal problems. Target Date: 2022-01-12 Frequency: Daily Modality: individual Progress: 50%  Related Problem: Recognize, accept, and cope with feelings of depression. Description: Verbalize insight into how past relationships may be influencing current experiences with depression. Target Date: 2022-01-12 Frequency: Daily Modality: individual Progress: 60%  Client Response full compliance  Service Location Location, [address] [place] [id number]  Code cpt 908321 [id number]/Reframe  Facilitate problem solving  Identify/label emotions  Validate/empathize  Emotion regulation skills  Self care activities  Lifestyle change (exercise, nutrition)  Self-monitoring  Identified an insight  Rationally challenge thoughts or beliefs/cognitive restructuring  Session notes:  F33.2  Goals: Wants to work on being genuine and being satisfied with herself and develop stronger self-esteem. Also, would like to improve her primary relationship and have her interpersonal and romantic needs met. This goal is now met, as she had ended the relationship. Wants to continue self-care and maintain weight loss. Needs to develop strategy to manage relationship with her son [name] struggles with mental health issues. Goal date 12-23. 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 Will also attempt to create a small, but satisfying social network with like-minded people. Goal date is 12-23.   Meds: Topamax, Citalopram (68m26mBuspar, Abilify 5mg 55mtient agrees to video Webex session due to the Coronavirus pandemic. She is at home and I am at my home office.   Sharon Cole says the work schedule has been challenging and interferes with accomplishing tasks. She is feeling positive about the job itself. She reports that her efforts to control her weight are slightly improving. She is trying to be mindful and make good choices. It seems to be working to some degree. She is also minimizing intake of carbohydrates.  Socially, Sharon Cole talked about her isolation which has been a life-long issue. Only exception was when the kids were younger. She never really had a network beyond 1 good friend.  Also discussed her lack of productivity of her art and ways to counter her procrastination. I suggested that she create a better structure. 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 She will give this a try.  [name], PhD  Time: 1:10p-2:00p 50 minutes.

## 2021-11-07 ENCOUNTER — Ambulatory Visit (INDEPENDENT_AMBULATORY_CARE_PROVIDER_SITE_OTHER): Payer: Medicare Other | Admitting: Psychology

## 2021-11-07 DIAGNOSIS — F325 Major depressive disorder, single episode, in full remission: Secondary | ICD-10-CM | POA: Diagnosis not present

## 2021-11-07 NOTE — Progress Notes (Signed)
                              Sharon Cole is a 66 y.o. female patient   11/07/2021  Treatment Plan: Diagnosis 296.32 (Major depressive affective disorder, recurrent episode, moderate) [n/a]  300.02 (Generalized anxiety disorder) [n/a]  Symptoms Depressed or irritable mood. (Status: maintained) -- No Description Entered  Feelings of hopelessness, worthlessness, or inappropriate guilt. (Status: maintained) -- No Description Entered  Lack of energy. (Status: maintained) -- No Description Entered  Low self-esteem. (Status: maintained) -- No Description Entered  Medication Status compliance  Safety none  If Suicidal or Homicidal State Action Taken: unspecified  Current Risk: low Medications Abilify (Dosage: .25mg)  Buspar (Dosage: 30mg)  Citalopram (Dosage: 20mg)  Topomax (Dosage: unknown)  Objectives Related Problem: Recognize, accept, and cope with feelings of depression. Description: Identify and replace thoughts and beliefs that support depression. Target Date: 2022-01-12 Frequency: Daily Modality: individual Progress: 30%  Related Problem: Recognize, accept, and cope with feelings of depression. Description: Learn and implement behavioral strategies to overcome depression. Target Date: 2022-01-12 Frequency: Daily Modality: individual Progress: 60%  Related Problem: Recognize, accept, and cope with feelings of depression. Description: Verbalize an understanding and resolution of current interpersonal problems. Target Date: 2022-01-12 Frequency: Daily Modality: individual Progress: 50%  Related Problem: Recognize, accept, and cope with feelings of depression. Description: Verbalize insight into how past relationships may be influencing current experiences with depression. Target Date: 2022-01-12 Frequency: Daily Modality: individual Progress: 60%  Client Response full compliance  Service Location Location, [address]  [place], [place] [address]  Service Code cpt 90834P  Normalize/Reframe  Facilitate problem solving  Identify/label emotions  Validate/empathize  Emotion regulation skills  Self care activities  Lifestyle change (exercise, nutrition)  Self-monitoring  Identified an insight  Rationally challenge thoughts or beliefs/cognitive restructuring  Session notes:  F33.2  Goals: Wants to work on being genuine and being satisfied with herself and develop stronger self-esteem. Also, would like to improve her primary relationship and have her interpersonal and romantic needs met. This goal is now met, as she had ended the relationship. Wants to continue self-care and maintain weight loss. Needs to develop strategy to manage relationship with her son , who struggles with mental health issues. Goal date 12-23. 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 Will also attempt to create a small, but satisfying social network with like-minded people. Goal date is 12-23.   Meds: Topamax, Citalopram (20mg), Buspar, Abilify 5mg  Patient agrees to video Webex session. She is at home and I am at my home office.   Sharon Cole had to work during the holiday yesterday. [place]. 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 Struggling to get herself motivated to get started (back to) doing her art projects. She claims current project is especially challenging and contributes to her avoidance. Admits to being "stuck". Told her to consider starting with a small project before getting back to finish the larger project.                                                                       [name], PhD  Time: 4:10p-5:00p 50 minutes.

## 2021-11-13 ENCOUNTER — Ambulatory Visit: Payer: Self-pay | Admitting: Psychology

## 2021-11-20 ENCOUNTER — Ambulatory Visit (INDEPENDENT_AMBULATORY_CARE_PROVIDER_SITE_OTHER): Payer: Medicare Other | Admitting: Psychology

## 2021-11-20 DIAGNOSIS — F325 Major depressive disorder, single episode, in full remission: Secondary | ICD-10-CM

## 2021-11-20 NOTE — Progress Notes (Signed)
Sharon Cole is a 66 y.o. female patient   11/20/2021  Treatment Plan: Diagnosis 296.32 (Major depressive affective disorder, recurrent episode, moderate) [n/a]  300.02 (Generalized anxiety disorder) [n/a]  Symptoms Depressed or irritable mood. (Status: maintained) -- No Description Entered  Feelings of hopelessness, worthlessness, or inappropriate guilt. (Status: maintained) -- No Description Entered  Lack of energy. (Status: maintained) -- No Description Entered  Low self-esteem. (Status: maintained) -- No Description Entered  Medication Status compliance  Safety none  If Suicidal or Homicidal State Action Taken: unspecified  Current Risk: low Medications Abilify (Dosage: .65m)  Buspar (Dosage: 396m  Citalopram (Dosage: 2032m Topomax (Dosage: unknown)  Objectives Related Problem: Recognize, accept, and cope with feelings of depression. Description: Identify and replace thoughts and beliefs that support depression. Target Date: 2022-01-12 Frequency: Daily Modality: individual Progress: 30%  Related Problem: Recognize, accept, and cope with feelings of depression. Description: Learn and implement behavioral strategies to overcome depression. Target Date: 2022-01-12 Frequency: Daily Modality: individual Progress: 60%  Related Problem: Recognize, accept, and cope with feelings of depression. Description: Verbalize an understanding and resolution of current interpersonal problems. Target Date: 2022-01-12 Frequency: Daily Modality: individual Progress: 50%  Related Problem: Recognize, accept, and cope with feelings of depression. Description: Verbalize insight into how past relationships may be influencing current experiences with depression. Target Date: 2022-01-12 Frequency: Daily Modality: individual Progress: 60%  Client Response full compliance  Service  Location Location, [address] [place] [id number] Code cpt [id number]/Reframe  Facilitate problem solving  Identify/label emotions  Validate/empathize  Emotion regulation skills  Self care activities  Lifestyle change (exercise, nutrition)  Self-monitoring  Identified an insight  Rationally challenge thoughts or beliefs/cognitive restructuring  Session notes:  F33.2  Goals: Wants to work on being genuine and being satisfied with herself and develop stronger self-esteem. Also, would like to improve her primary relationship and have her interpersonal and romantic needs met. This goal is now met, as she had ended the relationship. Wants to continue self-care and maintain weight loss. Needs to develop strategy to manage relationship with her son [name] struggles with mental health issues. Goal date 12-23. 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 Will also attempt to create a small, but satisfying social network with like-minded people. Goal date is 12-23.   Meds: Topamax, Citalopram (55m42mBuspar, Abilify 5mg 61mtient agrees to video Webex session. She is at home and I am at my home office.   [name] today is her first day off in 7 days. Pulled muscle in chest and was nervous it was cardiac, bout now believes it was just a pull. She went to beach and had a great time and relaxed. [place] hotel [place] the beach [place] 4 [place]. She now hopes to set up a "major trip", [place] continues to struggle socially in that there are few people she sees or talks to. Her best friend of 40 ye10s (former sister in law) Sports coacht entered [facility]. They have been out of touch for a long time (2 years) but [name] sent her a note. The communication they have had in recent years  has been sparse at best. [name] says that she thinks Collie Siad rejected her because of her relationship to her brother. Also, [name] had been critical of some of  Sue's behaviors which damaged their relationship. Collie Siad is near the end of life and [name] is quite distressed.  She spoke with son [name] for the first time in a long period. He was denied disability, but is now seeing a therapist. He has decided that part time employment is a good direction for him and is seeking to find something. This was first discussion in a couple of months and she is pleased and hopeful that he will be "a little more open".                                                                          [name], PhD  Time: 4:10p-5:00p 50 minutes.

## 2021-11-27 ENCOUNTER — Ambulatory Visit (INDEPENDENT_AMBULATORY_CARE_PROVIDER_SITE_OTHER): Payer: Medicare Other | Admitting: Psychology

## 2021-11-27 DIAGNOSIS — F325 Major depressive disorder, single episode, in full remission: Secondary | ICD-10-CM | POA: Diagnosis not present

## 2021-11-27 NOTE — Progress Notes (Signed)
Sharon Cole is a 66 y.o. female patient   11/27/2021  Treatment Plan: Diagnosis 296.32 (Major depressive affective disorder, recurrent episode, moderate) [n/a]  300.02 (Generalized anxiety disorder) [n/a]  Symptoms Depressed or irritable mood. (Status: maintained) -- No Description Entered  Feelings of hopelessness, worthlessness, or inappropriate guilt. (Status: maintained) -- No Description Entered  Lack of energy. (Status: maintained) -- No Description Entered  Low self-esteem. (Status: maintained) -- No Description Entered  Medication Status compliance  Safety none  If Suicidal or Homicidal State Action Taken: unspecified  Current Risk: low Medications Abilify (Dosage: .57m)  Buspar (Dosage: 319m  Citalopram (Dosage: 2076m Topomax (Dosage: unknown)  Objectives Related Problem: Recognize, accept, and cope with feelings of depression. Description: Identify and replace thoughts and beliefs that support depression. Target Date: 2022-01-12 Frequency: Daily Modality: individual Progress: 30%  Related Problem: Recognize, accept, and cope with feelings of depression. Description: Learn and implement behavioral strategies to overcome depression. Target Date: 2022-01-12 Frequency: Daily Modality: individual Progress: 60%  Related Problem: Recognize, accept, and cope with feelings of depression. Description: Verbalize an understanding and resolution of current interpersonal problems. Target Date: 2022-01-12 Frequency: Daily Modality: individual Progress: 50%  Related Problem: Recognize, accept, and cope with feelings of depression. Description: Verbalize insight into how past relationships may be influencing current experiences with depression. Target Date: 2022-01-12 Frequency: Daily Modality: individual Progress: 60%  Client Response full compliance  Service Location Location, [address] [place] [id number] Code cpt [id number]/Reframe  Facilitate problem solving  Identify/label emotions  Validate/empathize  Emotion regulation skills  Self care activities  Lifestyle change (exercise, nutrition)  Self-monitoring  Identified an insight  Rationally challenge thoughts or beliefs/cognitive restructuring  Session notes:  F33.2  Goals: Wants to work on being genuine and being satisfied with herself and develop stronger self-esteem. Also, would like to improve her primary relationship and have her interpersonal and romantic needs met. This goal is now met, as she had ended the relationship. Wants to continue self-care and maintain weight loss. Needs to develop strategy to manage relationship with her son [name] struggles with mental health issues. Goal date 12-23. 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 Will also attempt to create a small, but satisfying social network with like-minded people. Goal date is 12-23.   Meds: Topamax, Citalopram (74m74mBuspar, Abilify 5mg 30mtient agrees to video Webex session. She is at home and I am at my home office.   [name] talked about her social isolation and the fact that she does not have any friends. We discussed what needs to happen in order to develop a friendship network. She states that she has never really had a social network and really doesn't desire to have it at this point in her life. She says she was somewhat "invisible" in school and usually only had 1 or 2 friends. She says she was "always the odd man out" in her personal and professional life. She prefers to find a way to use her free time to relax and, at times, focus on her art.    She says she has a tooth ache and dreads going to the dentist because she has to have extensive dental work. She has a fear of the dentist and tends to procrastinate. She is going to try  and "get ahead of it" and finally take care of the work that has needed to be  done for a long time. She got a call from Dave, who became upset with her when she was not willing to "rescue" him regarding problems he was having with his dog. He essentially hung up on her. [name] is doing a good job of avoiding enabling behavior.                                                                              , LEWIS, PhD  Time: 1:15p-2:00p 45 minutes.                          

## 2021-11-30 ENCOUNTER — Ambulatory Visit (HOSPITAL_BASED_OUTPATIENT_CLINIC_OR_DEPARTMENT_OTHER): Payer: Medicare Other | Admitting: Psychiatry

## 2021-11-30 ENCOUNTER — Encounter (HOSPITAL_COMMUNITY): Payer: Self-pay | Admitting: Psychiatry

## 2021-11-30 DIAGNOSIS — Z79899 Other long term (current) drug therapy: Secondary | ICD-10-CM | POA: Diagnosis not present

## 2021-11-30 DIAGNOSIS — F331 Major depressive disorder, recurrent, moderate: Secondary | ICD-10-CM

## 2021-11-30 DIAGNOSIS — F411 Generalized anxiety disorder: Secondary | ICD-10-CM | POA: Diagnosis not present

## 2021-11-30 MED ORDER — ARIPIPRAZOLE 5 MG PO TABS: 5.0000 mg | ORAL_TABLET | Freq: Every day | ORAL | 1 refills | Status: DC

## 2021-11-30 MED ORDER — ESCITALOPRAM OXALATE 20 MG PO TABS: 20.0000 mg | ORAL_TABLET | Freq: Every day | ORAL | 1 refills | Status: DC

## 2021-11-30 MED ORDER — BUSPIRONE HCL 30 MG PO TABS: 30.0000 mg | ORAL_TABLET | Freq: Two times a day (BID) | ORAL | 1 refills | Status: DC

## 2021-11-30 NOTE — Progress Notes (Signed)
[facility] MD/PA/NP OP Progress Note  Sharon Cole  MRN:  [id number]   11/30/2021  Chief Complaint: Medication management appointment HPI: This was an in office/face-to-face appointment.  Location [facility] outpatient clinic.  Duration 25 minutes .    66 year-old female, history of anxiety/depression.  Has been diagnosed with GAD and MDD in the past .    She reports that she is generally doing well.  She reports her mood is "the best it has been in a while" and currently stable.  She presents euthymic.  She does endorse a mild degree of anhedonia but presents future oriented and, for example, during session spoke about planning a vacation overseas for next year. Describes functioning well in her daily activities.  She reports she is doing well in her job.  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  States that she has been busier and working longer hours.  Continues to see her therapist regularly for individual psychotherapy. With regards to medications, denies having side effects and feels that current medication regimen has been effective and well-tolerated. We reviewed medication side effects to include the potential risk of serotonin syndrome, potential risk of metabolic/motor side effects (including TD) on Abilify. No abnormal/involuntary movements are reported or noted at this time.         Visit Diagnosis: MDD by history Past Psychiatric History:   Past Medical History:  Past Medical History:  Diagnosis Date   Anxiety    Bradycardia    Common migraine with intractable migraine 07/03/2016   Depression    Dizziness    Headache    Menopause    Syncope     Past Surgical History:  Procedure Laterality Date   BACK SURGERY     cyst removal    BREAST SURGERY     breast reduction   BUNIONECTOMY     CHOLECYSTECTOMY     KNEE ARTHROSCOPY      Family Psychiatric History:   Family History:  Family History  Problem Relation Age of Onset   Heart disease Mother    Heart  disease Brother    Heart disease Maternal Grandmother    Heart disease Maternal Grandfather    Cancer Son        unknown    Social History:  Social History   Socioeconomic History   Marital status: Significant Other    Spouse name: Not on file   Number of children: 2   Years of education: Masters   Highest education level: Not on file  Occupational History   Not on file  Tobacco Use   Smoking status: Never   Smokeless tobacco: Never  Vaping Use   Vaping Use: Never used  Substance and Sexual Activity   Alcohol use: Yes    Alcohol/week: 1.0 standard drink of alcohol    Types: 1 Glasses of wine per week    Comment: daily   Drug use: No   Sexual activity: Yes    Partners: Male  Other Topics Concern   Not on file  Social History Narrative   Lives   Caffeine use:    Drinks 16oz caffeine drinks a day    Social Determinants of Radio broadcast assistant Strain: Not on file  Food Insecurity: Not on file  Transportation Needs: Not on file  Physical Activity: Not on file  Stress: Not on file  Social Connections: Not on file    Allergies: No Known Allergies  Metabolic Disorder Labs: No results found  for: "HGBA1C", "MPG" No results found for: "PROLACTIN" Lab Results  Component Value Date   CHOL 168 01/02/2015   TRIG 91.0 01/02/2015   HDL 53.70 01/02/2015   CHOLHDL 3 01/02/2015   VLDL 18.2 01/02/2015   LDLCALC 96 01/02/2015   LDLCALC 98 08/10/2013   Lab Results  Component Value Date   TSH 1.04 01/02/2015   TSH 2.986 08/10/2013    Therapeutic Level Labs: No results found for: "LITHIUM" No results found for: "VALPROATE" No results found for: "CBMZ"  Current Medications: Current Outpatient Medications  Medication Sig Dispense Refill   ARIPiprazole (ABILIFY) 5 MG tablet Take 1 tablet (5 mg total) by mouth daily. 30 tablet 1   busPIRone (BUSPAR) 30 MG tablet Take 1 tablet (30 mg total) by mouth 2 (two) times daily. 60 tablet 1   calcium-vitamin D (OSCAL  WITH D) 250-125 MG-UNIT tablet Take 1 tablet by mouth daily.     escitalopram (LEXAPRO) 20 MG tablet Take 1 tablet (20 mg total) by mouth daily. 30 tablet 1   Multiple Vitamins-Minerals (MULTIVITAMIN PO) Take 1 tablet by mouth daily.     omeprazole (PRILOSEC) 10 MG capsule Take 10 mg by mouth daily.     Rimegepant Sulfate (NURTEC) 75 MG TBDP Take 75 mg by mouth as needed (take 1 at onset of headache, max is 1 tablet in 24 hours). 8 tablet 11   No current facility-administered medications for this visit.       Psychiatric Specialty Exam:  ROS- does not endorse  There were no vitals taken for this visit.There is no height or weight on file to calculate BMI.  General Appearance: Alert, attentive, pleasant/cooperative, good eye contact, presents comfortable, without akathisia or any restlessness/agitation.  Well-groomed  Eye Contact: As above  Speech:  Normal Rate  Volume:  Normal  Mood: Reports mood as improved/stable and presents euthymic  Affect: reactive, full in range  Thought Process:  Linear and Descriptions of Associations: Intact  Orientation:  Full (Time, Place, and Person)  Thought Content:  No hallucinations, no delusions    Suicidal Thoughts:  No no SI, future oriented  Homicidal Thoughts:  No  Memory:   Recent and remote grossly intact  Judgement:  Other:  Present  Insight:  Present  Psychomotor Activity:  NA  Concentration:  Concentration: Good and Attention Span: Good  Recall:  Good  Fund of Knowledge: Good  Language: Good  Akathisia:  Negative  Handed:  Right  AIMS (if indicated): No involuntary or abnormal movements noted  Assets:  Communication Skills Desire for Improvement Resilience  ADL's:  Intact  Cognition: WNL  Sleep:  Good   Screenings:   Assessment and Plan:   66 year-old female with a history of depression/anxiety.    Sharon Cole presents euthymic and describes mood as improved and currently stable.  She is functioning well in her daily  activities.  She does not endorse significant neurovegetative symptoms although does report some residual/mild anhedonia.  She states current medication regimen is well-tolerated and helpful.  Denies side effects, which we reviewed.   Continue individual psychotherapy with Dr. Cheryln Manly   Encourage patient to follow-up with PCP periodically and check lipid panel/hemoglobin A1c (routine as on aripiprazole).  Would also check TSH.  Will see in about 8 weeks , agrees to contact clinic sooner if any worsening or medication concerns prior.  Continue current medication regimen.  (Buspar 30 mgrs BID, Lexapro 20 mgrs QDAY, Abilify 5 mg QDAY )  Gabriel Earing MD  Patient ID: Sharon Cole, female   DOB: [birth year], 66 y.o.   MRN: [id number]

## 2021-12-04 ENCOUNTER — Ambulatory Visit (INDEPENDENT_AMBULATORY_CARE_PROVIDER_SITE_OTHER): Payer: Medicare Other | Admitting: Psychology

## 2021-12-04 DIAGNOSIS — F411 Generalized anxiety disorder: Secondary | ICD-10-CM | POA: Diagnosis not present

## 2021-12-04 NOTE — Progress Notes (Signed)
Sharon Cole is a 66 y.o. female patient   12/04/2021  Treatment Plan: Diagnosis 296.32 (Major depressive affective disorder, recurrent episode, moderate) [n/a]  300.02 (Generalized anxiety disorder) [n/a]  Symptoms Depressed or irritable mood. (Status: maintained) -- No Description Entered  Feelings of hopelessness, worthlessness, or inappropriate guilt. (Status: maintained) -- No Description Entered  Lack of energy. (Status: maintained) -- No Description Entered  Low self-esteem. (Status: maintained) -- No Description Entered  Medication Status compliance  Safety none  If Suicidal or Homicidal State Action Taken: unspecified  Current Risk: low Medications Abilify (Dosage: .25m)  Buspar (Dosage: 345m  Citalopram (Dosage: 2055m Topomax (Dosage: unknown)  Objectives Related Problem: Recognize, accept, and cope with feelings of depression. Description: Identify and replace thoughts and beliefs that support depression. Target Date: 2022-01-12 Frequency: Daily Modality: individual Progress: 30%  Related Problem: Recognize, accept, and cope with feelings of depression. Description: Learn and implement behavioral strategies to overcome depression. Target Date: 2022-01-12 Frequency: Daily Modality: individual Progress: 60%  Related Problem: Recognize, accept, and cope with feelings of depression. Description: Verbalize an understanding and resolution of current interpersonal problems. Target Date: 2022-01-12 Frequency: Daily Modality: individual Progress: 50%  Related Problem: Recognize, accept, and cope with feelings of depression. Description: Verbalize insight into how past relationships may be influencing current experiences with depression. Target Date: 2022-01-12 Frequency: Daily Modality: individual Progress: 60%  Client Response full compliance  Service Location Location, [address] [place] [id number] Code cpt 908843 [id number]/Reframe  Facilitate problem solving  Identify/label emotions  Validate/empathize  Emotion regulation skills  Self care activities  Lifestyle change (exercise, nutrition)  Self-monitoring  Identified an insight  Rationally challenge thoughts or beliefs/cognitive restructuring  Session notes:  F33.2  Goals: Wants to work on being genuine and being satisfied with herself and develop stronger self-esteem. Also, would like to improve her primary relationship and have her interpersonal and romantic needs met. This goal is now met, as she had ended the relationship. Wants to continue self-care and maintain weight loss. Needs to develop strategy to manage relationship with her son Sharon Cole struggles with mental health issues. Goal date 12-23. 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 Will also attempt to create a small, but satisfying social network with like-minded people. Goal date is 12-23.   Meds: Topamax, Citalopram (7m82mBuspar, Abilify 5mg 59mtient agrees to video Webex session. She is at home and I am at my home office.   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 This is important for her to get started with the hope it will create some momentum. She did explore dentists and will go to the one she knows to get the work done. She has an intense fear and we discussed ways to best approach all the work she needs to have done. Will work on some relaxation strategies. We talked about her expanding herself and take an art class in an effort to meet other artists and gain additional knowledge.  [name], PhD  Time: 4:15p-5:00p 45 minutes.

## 2021-12-11 ENCOUNTER — Ambulatory Visit (INDEPENDENT_AMBULATORY_CARE_PROVIDER_SITE_OTHER): Payer: Medicare Other | Admitting: Psychology

## 2021-12-11 DIAGNOSIS — F411 Generalized anxiety disorder: Secondary | ICD-10-CM | POA: Diagnosis not present

## 2021-12-11 NOTE — Progress Notes (Signed)
Sharon Cole is a 66 y.o. female patient   12/11/2021  Treatment Plan: Diagnosis 296.32 (Major depressive affective disorder, recurrent episode, moderate) [n/a]  300.02 (Generalized anxiety disorder) [n/a]  Symptoms Depressed or irritable mood. (Status: maintained) -- No Description Entered  Feelings of hopelessness, worthlessness, or inappropriate guilt. (Status: maintained) -- No Description Entered  Lack of energy. (Status: maintained) -- No Description Entered  Low self-esteem. (Status: maintained) -- No Description Entered  Medication Status compliance  Safety none  If Suicidal or Homicidal State Action Taken: unspecified  Current Risk: low Medications Abilify (Dosage: .43m)  Buspar (Dosage: 362m  Citalopram (Dosage: 2063m Topomax (Dosage: unknown)  Objectives Related Problem: Recognize, accept, and cope with feelings of depression. Description: Identify and replace thoughts and beliefs that support depression. Target Date: 2022-01-12 Frequency: Daily Modality: individual Progress: 30%  Related Problem: Recognize, accept, and cope with feelings of depression. Description: Learn and implement behavioral strategies to overcome depression. Target Date: 2022-01-12 Frequency: Daily Modality: individual Progress: 60%  Related Problem: Recognize, accept, and cope with feelings of depression. Description: Verbalize an understanding and resolution of current interpersonal problems. Target Date: 2022-01-12 Frequency: Daily Modality: individual Progress: 50%  Related Problem: Recognize, accept, and cope with feelings of depression. Description: Verbalize insight into how past relationships may be influencing current experiences with depression. Target Date: 2022-01-12 Frequency: Daily Modality: individual Progress: 60%  Client Response full compliance  Service  Location Location, [address] [place] Code cpt [id number]/Reframe  Facilitate problem solving  Identify/label emotions  Validate/empathize  Emotion regulation skills  Self care activities  Lifestyle change (exercise, nutrition)  Self-monitoring  Identified an insight  Rationally challenge thoughts or beliefs/cognitive restructuring  Session notes:  F33.2  Goals: Wants to work on being genuine and being satisfied with herself and develop stronger self-esteem. Also, would like to improve her primary relationship and have her interpersonal and romantic needs met. This goal is now met, as she had ended the relationship. Wants to continue self-care and maintain weight loss. Needs to develop strategy to manage relationship with her son [name] struggles with mental health issues. Goal date 12-23. 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 Will also attempt to create a small, but satisfying social network with like-minded people. Goal date is 12-23.   Meds: Topamax, Citalopram (66m50mBuspar, Abilify 5mg 56mtient agrees to video Webex session. She is at home and I am at my home office.   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 She Cole she is feeling "pretty good". Sharon Cole "dark" again and isn't communicating. She found a class in stained glass and is considering it. [place] is slowly gaining more confidence at work. She is learning process and becoming more independent. She is felling less stress than before and reports more general satisfaction in her life.  Sharon Morel, PhD  Time: 1:15p-2:00p 45 minutes.

## 2021-12-18 ENCOUNTER — Ambulatory Visit (INDEPENDENT_AMBULATORY_CARE_PROVIDER_SITE_OTHER): Payer: Medicare Other | Admitting: Psychology

## 2021-12-18 DIAGNOSIS — F411 Generalized anxiety disorder: Secondary | ICD-10-CM | POA: Diagnosis not present

## 2021-12-18 NOTE — Progress Notes (Signed)
Sharon Cole is a 66 y.o. female patient   12/18/2021  Treatment Plan: Diagnosis 296.32 (Major depressive affective disorder, recurrent episode, moderate) [n/a]  300.02 (Generalized anxiety disorder) [n/a]  Symptoms Depressed or irritable mood. (Status: maintained) -- No Description Entered  Feelings of hopelessness, worthlessness, or inappropriate guilt. (Status: maintained) -- No Description Entered  Lack of energy. (Status: maintained) -- No Description Entered  Low self-esteem. (Status: maintained) -- No Description Entered  Medication Status compliance  Safety none  If Suicidal or Homicidal State Action Taken: unspecified  Current Risk: low Medications Abilify (Dosage: .8m)  Buspar (Dosage: 342m  Citalopram (Dosage: 2028m Topomax (Dosage: unknown)  Objectives Related Problem: Recognize, accept, and cope with feelings of depression. Description: Identify and replace thoughts and beliefs that support depression. Target Date: 2022-01-12 Frequency: Daily Modality: individual Progress: 30%  Related Problem: Recognize, accept, and cope with feelings of depression. Description: Learn and implement behavioral strategies to overcome depression. Target Date: 2022-01-12 Frequency: Daily Modality: individual Progress: 60%  Related Problem: Recognize, accept, and cope with feelings of depression. Description: Verbalize an understanding and resolution of current interpersonal problems. Target Date: 2022-01-12 Frequency: Daily Modality: individual Progress: 50%  Related Problem: Recognize, accept, and cope with feelings of depression. Description: Verbalize insight into how past relationships may be influencing current experiences with depression. Target Date: 2022-01-12 Frequency: Daily Modality: individual Progress: 60%  Client  Response full compliance  Service Location Location, [address] [place] [id number] Code cpt [id number]/Reframe  Facilitate problem solving  Identify/label emotions  Validate/empathize  Emotion regulation skills  Self care activities  Lifestyle change (exercise, nutrition)  Self-monitoring  Identified an insight  Rationally challenge thoughts or beliefs/cognitive restructuring  Session notes:  F33.2  Goals: Wants to work on being genuine and being satisfied with herself and develop stronger self-esteem. Also, would like to improve her primary relationship and have her interpersonal and romantic needs met. This goal is now met, as she had ended the relationship. Wants to continue self-care and maintain weight loss. Needs to develop strategy to manage relationship with her son [name] struggles with mental health issues. Goal date 12-23. 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 Will also attempt to create a small, but satisfying social network with like-minded people. Goal date is 12-23.   Meds: Topamax, Citalopram (74m65mBuspar, Abilify 5mg 106mtient agrees to video Webex session. She is at home and I am at my home office.   Sharon Cole says she is feeling good and "even". Says "I'm just going along" and that her work is "fine". She says there has not been any surprises at work, but she still has more to learn. 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 She did, however, pick it up today. Motivation has been difficult. She is not feeling good about herself and especially her weight and her dental problems. Claims that she is ready to call the dentist when she gets back from the beach next week. We talked about her  delay in scheduling and she agreed to call for appointment first thing in the morning. She is having difficulty facing all of the dental work that needs to be done. She will end up with dentures on  the upper and partial on the bottom. With regard to the weight, she is very frustrated because she says she has to "eat so little". She is trying to stay within 1200 calories and there is no enjoyment in eating. She talked about her mother's hurtful comments over the years about her weight. Not until the very end of her life did her mother acknowledge that she "did damage" to Sharon Cole's psyche. That acknowledgement "did nothing" to help Sharon Cole manage her feelings. We talked about getting that "voice" out of her head and focusing on success.                                                                                           Sharon Morel, PhD  Time: 4:10p-5:00p 50 minutes.

## 2021-12-25 ENCOUNTER — Ambulatory Visit (INDEPENDENT_AMBULATORY_CARE_PROVIDER_SITE_OTHER): Payer: Medicare Other | Admitting: Psychology

## 2021-12-25 DIAGNOSIS — F411 Generalized anxiety disorder: Secondary | ICD-10-CM

## 2021-12-25 NOTE — Progress Notes (Signed)
Sharon Cole is a 66 y.o. female patient   12/25/2021  Treatment Plan: Diagnosis 296.32 (Major depressive affective disorder, recurrent episode, moderate) [n/a]  300.02 (Generalized anxiety disorder) [n/a]  Symptoms Depressed or irritable mood. (Status: maintained) -- No Description Entered  Feelings of hopelessness, worthlessness, or inappropriate guilt. (Status: maintained) -- No Description Entered  Lack of energy. (Status: maintained) -- No Description Entered  Low self-esteem. (Status: maintained) -- No Description Entered  Medication Status compliance  Safety none  If Suicidal or Homicidal State Action Taken: unspecified  Current Risk: low Medications Abilify (Dosage: .105m)  Buspar (Dosage: 326m  Citalopram (Dosage: 2074m Topomax (Dosage: unknown)  Objectives Related Problem: Recognize, accept, and cope with feelings of depression. Description: Identify and replace thoughts and beliefs that support depression. Target Date: 2022-01-12 Frequency: Daily Modality: individual Progress: 30%  Related Problem: Recognize, accept, and cope with feelings of depression. Description: Learn and implement behavioral strategies to overcome depression. Target Date: 2022-01-12 Frequency: Daily Modality: individual Progress: 60%  Related Problem: Recognize, accept, and cope with feelings of depression. Description: Verbalize an understanding and resolution of current interpersonal problems. Target Date: 2022-01-12 Frequency: Daily Modality: individual Progress: 50%  Related Problem: Recognize, accept, and cope with feelings of depression. Description: Verbalize insight into how past relationships may be influencing current experiences with depression. Target Date: 2022-01-12 Frequency: Daily Modality: individual Progress: 60%  Client Response full compliance  Service Location Location, [address] [place] [id number] Code cpt [id number]/Reframe  Facilitate problem solving  Identify/label emotions  Validate/empathize  Emotion regulation skills  Self care activities  Lifestyle change (exercise, nutrition)  Self-monitoring  Identified an insight  Rationally challenge thoughts or beliefs/cognitive restructuring  Session notes:  F33.2  Goals: Wants to work on being genuine and being satisfied with herself and develop stronger self-esteem. Also, would like to improve her primary relationship and have her interpersonal and romantic needs met. This goal is now met, as she had ended the relationship. Wants to continue self-care and maintain weight loss. Needs to develop strategy to manage relationship with her son [name] struggles with mental health issues. Goal date 12-23. 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 Will also attempt to create a small, but satisfying social network with like-minded people. Goal date is 12-23.   Meds: Topamax, Citalopram (29m57mBuspar, Abilify 5mg 71mtient agrees to video Webex session. She is at home and I am at my home office.   Sharon Cole says she is at beach with her son's family. Everyone is having fun and she is "finally" relaxing. She says that she has been feeling mixed about the memory of her mother. She loved her and they were close, but says "I feel bad that I don't miss her". Her mom could be extremely negative and judgmental. She feels guilty that she does not think of her often. Still has strong feelings about her mother's decision to give up and die. She feels bad about the difficult circumstances in which her mother was raised. Her mom and biological father were married for 11 years and "it was terrible". He was not a good guy. Sharon Cole was 11 wh64 they divorced. She does not have a lot of memory of him, but he was "okay" with her. All contact ended when she was 14 an57Gary [name]11. Father remarried a younger woman and they had a son together. She  says that the "  need" to have a man in her life is related to the abandonment she experienced from her father. Her challenging childhood translated to many of her relationship problems as an adult.                                                                                              [name], PhD  Time: 1:10p-2:00p 50 minutes.

## 2022-01-01 ENCOUNTER — Ambulatory Visit: Payer: BLUE CROSS/BLUE SHIELD | Admitting: Psychology

## 2022-01-08 ENCOUNTER — Ambulatory Visit: Payer: BLUE CROSS/BLUE SHIELD | Admitting: Psychology

## 2022-01-11 ENCOUNTER — Other Ambulatory Visit (HOSPITAL_COMMUNITY): Payer: Self-pay | Admitting: Psychiatry

## 2022-01-15 ENCOUNTER — Ambulatory Visit (INDEPENDENT_AMBULATORY_CARE_PROVIDER_SITE_OTHER): Payer: Medicare Other | Admitting: Psychology

## 2022-01-15 DIAGNOSIS — F411 Generalized anxiety disorder: Secondary | ICD-10-CM

## 2022-01-15 NOTE — Progress Notes (Addendum)
Sharon Cole is a 66 y.o. female patient   01/15/2022  Treatment Plan: Diagnosis 296.32 (Major depressive affective disorder, recurrent episode, moderate) [n/a]  300.02 (Generalized anxiety disorder) [n/a]  Symptoms Depressed or irritable mood. (Status: maintained) -- No Description Entered  Feelings of hopelessness, worthlessness, or inappropriate guilt. (Status: maintained) -- No Description Entered  Lack of energy. (Status: maintained) -- No Description Entered  Low self-esteem. (Status: maintained) -- No Description Entered  Medication Status compliance  Safety none  If Suicidal or Homicidal State Action Taken: unspecified  Current Risk: low Medications Abilify (Dosage: .49m)  Buspar (Dosage: 335m  Citalopram (Dosage: 2050m Topomax (Dosage: unknown)  Objectives Related Problem: Recognize, accept, and cope with feelings of depression. Description: Identify and replace thoughts and beliefs that support depression. Target Date: 2022-05-05 Frequency: Daily Modality: individual Progress: 60%  Related Problem: Recognize, accept, and cope with feelings of depression. Description: Learn and implement behavioral strategies to overcome depression. Target Date: 2022-05-05 Frequency: Daily Modality: individual Progress: 60%  Related Problem: Recognize, accept, and cope with feelings of depression. Description: Verbalize an understanding and resolution of current interpersonal problems. Target Date: 2022-05-05 Frequency: Daily Modality: individual Progress: 90%  Related Problem: Recognize, accept, and cope with feelings of depression. Description: Verbalize insight into how past relationships may be influencing current experiences with depression. Target Date: 2022-05-05 Frequency: Daily Modality: individual Progress: 80%  Client Response full compliance  Service Location Location, [address] [place] [id number]  Code cpt [id number]/Reframe  Facilitate problem solving  Identify/label emotions  Validate/empathize  Emotion regulation skills  Self care activities  Lifestyle change (exercise, nutrition)  Self-monitoring  Identified an insight  Rationally challenge thoughts or beliefs/cognitive restructuring  Session notes:  F33.2  Goals: Wants to work on being genuine and being satisfied with herself and develop stronger self-esteem. Also, would like to improve her primary relationship and have her interpersonal and romantic needs met. This goal is now met, as she had ended the relationship. Wants to continue self-care and maintain weight loss. Needs to develop strategy to manage relationship with her son [name] struggles with mental health issues. Goal date 12-23. 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 Will also attempt to create a small, but satisfying social network with like-minded people. Goal date is 12-31.   Meds: Topamax, Citalopram (21m9mBuspar, Abilify 5mg 62mtient agrees to video Webex session. She is at home and I am at my home office.   Sharon Cole says her daughter in law got very sick and she has been helping out. A friend has come in to help so Sharon Cole involved. She says her work is going along well "for the most part". Sharon Cole hasn't talk to Sharon Cole. She says "I should, but do not want to hear the negativity". Now fears that Sharon Sharon Schoonerrobably mad at her. We talked about strategy to deal with Sharon Cole agreed to call him on her day off tomorrow.  We talked about her tendency to sabotage her weight loss. Every time she loses several pounds, she will do something that reverses the process. She isn't sure why she might do this because she has been so focused on losing weight. She wonders if she needs to change the way she diets. She will explore [employer] [facility] [employer].                                                                                                     Sharon Morel, PhD  Time: 4:10p-5:00p 50 minutes.

## 2022-01-21 ENCOUNTER — Telehealth (HOSPITAL_COMMUNITY): Payer: Medicare Other | Admitting: Psychiatry

## 2022-01-22 ENCOUNTER — Ambulatory Visit: Payer: BLUE CROSS/BLUE SHIELD | Admitting: Psychology

## 2022-01-29 ENCOUNTER — Ambulatory Visit (INDEPENDENT_AMBULATORY_CARE_PROVIDER_SITE_OTHER): Payer: Medicare Other | Admitting: Psychology

## 2022-01-29 DIAGNOSIS — F411 Generalized anxiety disorder: Secondary | ICD-10-CM | POA: Diagnosis not present

## 2022-01-29 DIAGNOSIS — F331 Major depressive disorder, recurrent, moderate: Secondary | ICD-10-CM

## 2022-01-29 NOTE — Progress Notes (Addendum)
Sharon Cole is a 66 y.o. female patient   01/29/2022  Treatment Plan: Diagnosis 296.32 (Major depressive affective disorder, recurrent episode, moderate) [n/a]  300.02 (Generalized anxiety disorder) [n/a]  Symptoms Depressed or irritable mood. (Status: maintained) -- No Description Entered  Feelings of hopelessness, worthlessness, or inappropriate guilt. (Status: maintained) -- No Description Entered  Lack of energy. (Status: maintained) -- No Description Entered  Low self-esteem. (Status: maintained) -- No Description Entered  Medication Status compliance  Safety none  If Suicidal or Homicidal State Action Taken: unspecified  Current Risk: low Medications Abilify (Dosage: .67m)  Buspar (Dosage: 345m  Citalopram (Dosage: 2042m Topomax (Dosage: unknown)  Objectives Related Problem: Recognize, accept, and cope with feelings of depression. Description: Identify and replace thoughts and beliefs that support depression. Target Date: 2022-09-12 Frequency: Daily Modality: individual Progress: 60%  Related Problem: Recognize, accept, and cope with feelings of depression. Description: Learn and implement behavioral strategies to overcome depression. Target Date: 2022-09-12 Frequency: Daily Modality: individual Progress: 60%  Related Problem: Recognize, accept, and cope with feelings of depression. Description: Verbalize an understanding and resolution of current interpersonal problems. Target Date: 2022-09-12 Frequency: Daily Modality: individual Progress: 80%  Related Problem: Recognize, accept, and cope with feelings of depression. Description: Verbalize insight into how past relationships may be influencing current experiences with depression. Target Date: 2022-09-12 Frequency: Daily Modality: individual Progress: 80%  Client Response full compliance  Service Location Location, [address] [place] [id number]  Code cpt [id number]/Reframe  Facilitate problem solving  Identify/label emotions  Validate/empathize  Emotion regulation skills  Self care activities  Lifestyle change (exercise, nutrition)  Self-monitoring  Identified an insight  Rationally challenge thoughts or beliefs/cognitive restructuring  Session notes:  F33.2  Goals: Wants to work on being genuine and being satisfied with herself and develop stronger self-esteem. Also, would like to improve her primary relationship and have her interpersonal and romantic needs met. This goal is now met, as she had ended the relationship. Wants to continue self-care and maintain weight loss. Needs to develop strategy to manage relationship with her son [name] struggles with mental health issues. Goal date 5-24. 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 Will also attempt to create a small, but satisfying social network with like-minded people. Goal date is 5-24.   Meds: Topamax, Citalopram (51m53mBuspar, Abilify 5mg 23mtient agrees to video Webex session. She is at home and I am at my home office.   Sharon hSuzi Rootsbeen very busy at work. She says she has made a decision to stop smoking weed in her effort to save money to start paying a mortgage payment to brother. We talked about the additional importance of avoiding weed as a way to stop medicating herself. She agrees and we talked about some of the challenges she may face.  Her mother's unveiling is next week. She is very excited that her son Dave Waunita Schoonerlost 55 pounds, is now on Prozac and appears far less depressed. She had called him and says he sounded completely different. She said he is looking for a job and is considering asking a woman for a date. Sharon is thrilled and relieved.  She will work on her "sobriety" this week with the intent of not having any weed or THC products. Far more optimistic and goal oriented than before.  [name], PhD  Time: 4:10p-5:00p 50 minutes.

## 2022-02-05 ENCOUNTER — Ambulatory Visit (INDEPENDENT_AMBULATORY_CARE_PROVIDER_SITE_OTHER): Payer: Medicare Other | Admitting: Psychology

## 2022-02-05 DIAGNOSIS — F411 Generalized anxiety disorder: Secondary | ICD-10-CM

## 2022-02-05 NOTE — Progress Notes (Addendum)
Sharon Cole is a 66 y.o. female patient   02/05/2022  Treatment Plan: Diagnosis 296.32 (Major depressive affective disorder, recurrent episode, moderate) [n/a]  300.02 (Generalized anxiety disorder) [n/a]  Symptoms Depressed or irritable mood. (Status: maintained) -- No Description Entered  Feelings of hopelessness, worthlessness, or inappropriate guilt. (Status: maintained) -- No Description Entered  Lack of energy. (Status: maintained) -- No Description Entered  Low self-esteem. (Status: maintained) -- No Description Entered  Medication Status compliance  Safety none  If Suicidal or Homicidal State Action Taken: unspecified  Current Risk: low Medications Abilify (Dosage: .91m)  Buspar (Dosage: 338m  Citalopram (Dosage: 2035m Topomax (Dosage: unknown)  Objectives Related Problem: Recognize, accept, and cope with feelings of depression. Description: Identify and replace thoughts and beliefs that support depression. Target Date: 2022-09-12 Frequency: Daily Modality: individual Progress: 60%  Related Problem: Recognize, accept, and cope with feelings of depression. Description: Learn and implement behavioral strategies to overcome depression. Target Date: 2022-09-12 Frequency: Daily Modality: individual Progress: 60%  Related Problem: Recognize, accept, and cope with feelings of depression. Description: Verbalize an understanding and resolution of current interpersonal problems. Target Date: 2022-09-12 Frequency: Daily Modality: individual Progress: 80%  Related Problem: Recognize, accept, and cope with feelings of depression. Description: Verbalize insight into how past relationships may be influencing current experiences with depression. Target Date: 2022-09-12 Frequency: Daily Modality: individual Progress: 80%  Client Response full compliance  Service Location Location, [address] [place] [id number] Code cpt [phone]/Reframe  Facilitate problem solving  Identify/label emotions  Validate/empathize  Emotion regulation skills  Self care activities  Lifestyle change (exercise, nutrition)  Self-monitoring  Identified an insight  Rationally challenge thoughts or beliefs/cognitive restructuring  Session notes:  F33.2  Goals: Wants to work on being genuine and being satisfied with herself and develop stronger self-esteem. Also, would like to improve her primary relationship and have her interpersonal and romantic needs met. This goal is now met, as she had ended the relationship. Wants to continue self-care and maintain weight loss. Needs to develop strategy to manage relationship with her son [name] struggles with mental health issues. Goal date 5-24. 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 Will also attempt to create a small, but satisfying social network with like-minded people. Goal date is 5-24.   Meds: Topamax, Citalopram (69m39mBuspar, Abilify 5mg 36mtient agrees to video Webex session. She is at home and I am at my home office.   [name] that tomorrow is the unveiling for her mother and the family will all be gathering. She is not sure if [name] join. Brian's wife is very ill and unable to work. She has Potts disease. She says all her free time is spent [place] she is unable to have time to work on her art. [name]'s sister is here and able to help, which frees up time for [name]. She states that "my mother is tied to the art work, as is my previous life with [name]"Higher education careers advisere acknowledges that she misses the lifestyle she had with [name],Liliane Channel does not miss him. She needs some things for her art "studio" to make it more manageable to work, but is trying to save some money. She made her appointment for her dental work, which she finds terrifying. It is scheduled for Oct. 17th. to do x-rays and discuss a plan.  [name], PhD  Time: 1:10p-2:00p 50 minutes.

## 2022-02-06 LAB — LIPID PANEL
Chol/HDL Ratio: 2.9 ratio (ref 0.0–4.4)
Cholesterol, Total: 192 mg/dL (ref 100–199)
HDL: 67 mg/dL (ref >39)
LDL Chol Calc (NIH): 108 mg/dL — ABNORMAL HIGH (ref 0–99)
Triglycerides: 98 mg/dL (ref 0–149)
VLDL Cholesterol Cal: 17 mg/dL (ref 5–40)

## 2022-02-06 LAB — HEMOGLOBIN A1C
Est. average glucose Bld gHb Est-mCnc: 117 mg/dL
Hgb A1c MFr Bld: 5.7 % — ABNORMAL HIGH (ref 4.8–5.6)

## 2022-02-06 LAB — TSH: TSH: 1.76 u[IU]/mL (ref 0.450–4.500)

## 2022-02-07 ENCOUNTER — Telehealth (HOSPITAL_BASED_OUTPATIENT_CLINIC_OR_DEPARTMENT_OTHER): Payer: Medicare Other | Admitting: Psychiatry

## 2022-02-07 ENCOUNTER — Telehealth (HOSPITAL_COMMUNITY): Payer: Medicare Other | Admitting: Psychiatry

## 2022-02-07 ENCOUNTER — Encounter (HOSPITAL_COMMUNITY): Payer: Self-pay | Admitting: Psychiatry

## 2022-02-07 DIAGNOSIS — F331 Major depressive disorder, recurrent, moderate: Secondary | ICD-10-CM | POA: Diagnosis not present

## 2022-02-07 DIAGNOSIS — F325 Major depressive disorder, single episode, in full remission: Secondary | ICD-10-CM

## 2022-02-07 DIAGNOSIS — F411 Generalized anxiety disorder: Secondary | ICD-10-CM

## 2022-02-07 MED ORDER — ESCITALOPRAM OXALATE 20 MG PO TABS: 20.0000 mg | ORAL_TABLET | Freq: Every day | ORAL | 1 refills | Status: DC

## 2022-02-07 MED ORDER — BUSPIRONE HCL 30 MG PO TABS: 30.0000 mg | ORAL_TABLET | Freq: Two times a day (BID) | ORAL | 1 refills | Status: DC

## 2022-02-07 MED ORDER — ARIPIPRAZOLE 5 MG PO TABS: 5.0000 mg | ORAL_TABLET | Freq: Every day | ORAL | 1 refills | Status: DC

## 2022-02-07 NOTE — Progress Notes (Signed)
[facility] MD/PA/NP OP Progress Note  Sharon Cole  MRN:  [id number]   11/30/2021  Chief Complaint: Medication management appointment HPI: This was a phone based appointment for medication management.  Patient's identity confirmed using 2 different identifiers.  Limitations associated with this type of communication have been reviewed.  Location of parties [facility]  Duration 20 minutes    66 year-old female, history of anxiety/depression.  Has been diagnosed with GAD and MDD in the past .  Sharon Cole she has been doing well in her daily activities. Currently describes her mood as stable and euthymic.  Her affect presents bright and full in range. Does not currently endorse anhedonia or other neurovegetative symptoms. No SI.She presents future oriented ( for example, spoke about planning to have dental procedure/crowns early next year) Anxiety symptoms have been well controlled. 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  She states that this job is going well.  She also has been spending time with her son and daughter-in-law, grandchildren. Denies having any medication side effects.  She feels medications have been effective and well-tolerated. Side effect profile reviewed, including potential risk for serotonin syndrome, potential risk for motor side effects including TD on Abilify. Labs reviewed-10/3 TSH 1.76, hemoglobin A1c 5.7, cholesterol 192, HDL 67, triglycerides 98, LDL 108    Visit Diagnosis: MDD by history Past Psychiatric History:   Past Medical History:  Past Medical History:  Diagnosis Date   Anxiety    Bradycardia    Common migraine with intractable migraine 07/03/2016   Depression    Dizziness    Headache    Menopause    Syncope     Past Surgical History:  Procedure Laterality Date   BACK SURGERY     cyst removal    BREAST SURGERY     breast reduction   BUNIONECTOMY     CHOLECYSTECTOMY     KNEE ARTHROSCOPY       Family Psychiatric History:   Family History:  Family History  Problem Relation Age of Onset   Heart disease Mother    Heart disease Brother    Heart disease Maternal Grandmother    Heart disease Maternal Grandfather    Cancer Son        unknown    Social History:  Social History   Socioeconomic History   Marital status: Significant Other    Spouse name: Not on file   Number of children: 2   Years of education: Masters   Highest education level: Not on file  Occupational History   Not on file  Tobacco Use   Smoking status: Never   Smokeless tobacco: Never  Vaping Use   Vaping Use: Never used  Substance and Sexual Activity   Alcohol use: Yes    Alcohol/week: 1.0 standard drink of alcohol    Types: 1 Glasses of wine per week    Comment: daily   Drug use: No   Sexual activity: Yes    Partners: Male  Other Topics Concern   Not on file  Social History Narrative   Lives   Caffeine use:    Drinks 16oz caffeine drinks a day    Social Determinants of Radio broadcast assistant Strain: Not on file  Food Insecurity: Not on file  Transportation Needs: Not on file  Physical Activity: Not on file  Stress: Not on file  Social Connections: Not on file    Allergies: No Known Allergies  Metabolic Disorder  Labs: Lab Results  Component Value Date   HGBA1C 5.7 (H) 02/05/2022   No results found for: "PROLACTIN" Lab Results  Component Value Date   CHOL 192 02/05/2022   TRIG 98 02/05/2022   HDL 67 02/05/2022   CHOLHDL 2.9 02/05/2022   VLDL 18.2 01/02/2015   LDLCALC 108 (H) 02/05/2022   LDLCALC 96 01/02/2015   Lab Results  Component Value Date   TSH 1.760 02/05/2022   TSH 1.04 01/02/2015    Therapeutic Level Labs: No results found for: "LITHIUM" No results found for: "VALPROATE" No results found for: "CBMZ"  Current Medications: Current Outpatient Medications  Medication Sig Dispense Refill   ARIPiprazole (ABILIFY) 5 MG tablet Take 1 tablet (5 mg  total) by mouth daily. 30 tablet 1   busPIRone (BUSPAR) 30 MG tablet Take 1 tablet (30 mg total) by mouth 2 (two) times daily. 60 tablet 1   calcium-vitamin D (OSCAL WITH D) 250-125 MG-UNIT tablet Take 1 tablet by mouth daily.     escitalopram (LEXAPRO) 20 MG tablet Take 1 tablet (20 mg total) by mouth daily. 30 tablet 1   Multiple Vitamins-Minerals (MULTIVITAMIN PO) Take 1 tablet by mouth daily.     omeprazole (PRILOSEC) 10 MG capsule Take 10 mg by mouth daily.     Rimegepant Sulfate (NURTEC) 75 MG TBDP Take 75 mg by mouth as needed (take 1 at onset of headache, max is 1 tablet in 24 hours). 8 tablet 11   No current facility-administered medications for this visit.       Psychiatric Specialty Exam: Please note limitations in obtaining full mental status exam in the context of phone communication ROS- does not endorse  There were no vitals taken for this visit.There is no height or weight on file to calculate BMI.  General Appearance: NA  Eye Contact: N/A  Speech:  Normal Rate  Volume:  Normal  Mood: Euthymic  Affect: reactive, full in range  Thought Process:  Linear and Descriptions of Associations: Intact  Orientation:  Full (Time, Place, and Person)  Thought Content:  No hallucinations, no delusions    Suicidal Thoughts:  No no SI, future oriented  Homicidal Thoughts:  No  Memory:   Recent and remote grossly intact  Judgement:  Other:  Present  Insight:  Present  Psychomotor Activity:  NA  Concentration:  Concentration: Good and Attention Span: Good  Recall:  Good  Fund of Knowledge: Good  Language: Good  Akathisia:  Negative  Handed:  Right  AIMS (if indicated): No involuntary or abnormal movements noted  Assets:  Communication Skills Desire for Improvement Resilience  ADL's:  Intact  Cognition: WNL  Sleep:  Good   Screenings:   Assessment and Plan:   66 year-old female with a history of depression/anxiety.   Currently Cole stable mood, presents euthymic  with a full range of affect, functioning well in daily activities.  No neurovegetative symptoms or anhedonia endorsed.  In general describes overall improvement of mood and anxiety symptoms over the last year. Tolerating current medication regimen well without side effects.  Labs reviewed as above.     Continue individual psychotherapy with Dr. Cheryln Manly   Will see in about 8 weeks , agrees to contact clinic sooner if any worsening or medication concerns prior.  Continue current medication regimen.  (Buspar 30 mgrs BID, Lexapro 20 mgrs QDAY, Abilify 5 mg QDAY )        F Hetty Linhart MD  Patient ID: Sharon Cole, female  DOB: Jan 05, 1956, 66 y.o.   MRN: [id number]

## 2022-02-12 ENCOUNTER — Ambulatory Visit (INDEPENDENT_AMBULATORY_CARE_PROVIDER_SITE_OTHER): Payer: Medicare Other | Admitting: Psychology

## 2022-02-12 DIAGNOSIS — F325 Major depressive disorder, single episode, in full remission: Secondary | ICD-10-CM

## 2022-02-12 NOTE — Progress Notes (Addendum)
Sharon Cole is a 66 y.o. female patient   02/12/2022  Treatment Plan: Diagnosis 296.32 (Major depressive affective disorder, recurrent episode, moderate) [n/a]  300.02 (Generalized anxiety disorder) [n/a]  Symptoms Depressed or irritable mood. (Status: maintained) -- No Description Entered  Feelings of hopelessness, worthlessness, or inappropriate guilt. (Status: maintained) -- No Description Entered  Lack of energy. (Status: maintained) -- No Description Entered  Low self-esteem. (Status: maintained) -- No Description Entered  Medication Status compliance  Safety none  If Suicidal or Homicidal State Action Taken: unspecified  Current Risk: low Medications Abilify (Dosage: .10m)  Buspar (Dosage: 334m  Citalopram (Dosage: 206m Topomax (Dosage: unknown)  Objectives Related Problem: Recognize, accept, and cope with feelings of depression. Description: Identify and replace thoughts and beliefs that support depression. Target Date: 2022-09-12 Frequency: Daily Modality: individual Progress: 60%  Related Problem: Recognize, accept, and cope with feelings of depression. Description: Learn and implement behavioral strategies to overcome depression. Target Date: 2022-09-12 Frequency: Daily Modality: individual Progress: 60%  Related Problem: Recognize, accept, and cope with feelings of depression. Description: Verbalize an understanding and resolution of current interpersonal problems. Target Date: 2022-09-12 Frequency: Daily Modality: individual Progress: 80%  Related Problem: Recognize, accept, and cope with feelings of depression. Description: Verbalize insight into how past relationships may be influencing current experiences with depression. Target Date: 2022-09-12 Frequency: Daily Modality: individual Progress: 80%  Client Response full compliance  Service Location Location, [address] [place] [id number] Code cpt [phone]/Reframe  Facilitate problem solving  Identify/label emotions  Validate/empathize  Emotion regulation skills  Self care activities  Lifestyle change (exercise, nutrition)  Self-monitoring  Identified an insight  Rationally challenge thoughts or beliefs/cognitive restructuring  Session notes:  F33.2  Goals: Wants to work on being genuine and being satisfied with herself and develop stronger self-esteem. Also, would like to improve her primary relationship and have her interpersonal and romantic needs met. This goal is now met, as she had ended the relationship. Wants to continue self-care and maintain weight loss. Needs to develop strategy to manage relationship with her son [name] struggles with mental health issues. Goal date 12-23. 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 Will also attempt to create a small, but satisfying social network with like-minded people. Goal date is 12-23.   Meds: Topamax, Citalopram (68m43mBuspar, Abilify 5mg 84mtient agrees to video Webex session. She is at home and I am at my home office.   Sharon Cole says the unveiling was very nice. DavidShanon Brownot come, but the family dynamic was fine. Her brother Sharon [place] in and "looked her over" when he came in. Sharon Cole was surprised by her lack of emotions during the ceremony. We talked about why she was lacking emotions. It is related to the complicated relationship she had with her mother. She has her dental appointment next week and is overwhelmed with anxiety. She wants to wait and discuss next week rather than address her anxiety today. Self care is going okay. She is "dieting" without being on a particular plan. She claims "I know what to do" and it is just a matter of execution. She feels once her teeth are fixed, she will have a lot more foods available to eat. She is trying to find the right balance and not consume too many carbs. She states that she still has not  smoked weed.  Sharon Morel, PhD  Time: 1:10p-2:00p 50 minutes.

## 2022-02-13 ENCOUNTER — Other Ambulatory Visit (HOSPITAL_COMMUNITY): Payer: Self-pay | Admitting: *Deleted

## 2022-02-13 DIAGNOSIS — F411 Generalized anxiety disorder: Secondary | ICD-10-CM

## 2022-02-13 DIAGNOSIS — F331 Major depressive disorder, recurrent, moderate: Secondary | ICD-10-CM

## 2022-02-13 MED ORDER — ESCITALOPRAM OXALATE 20 MG PO TABS: 20.0000 mg | ORAL_TABLET | Freq: Every day | ORAL | 1 refills | Status: DC

## 2022-02-13 MED ORDER — BUSPIRONE HCL 30 MG PO TABS: 30.0000 mg | ORAL_TABLET | Freq: Two times a day (BID) | ORAL | 1 refills | Status: DC

## 2022-02-13 MED ORDER — ARIPIPRAZOLE 5 MG PO TABS: 5.0000 mg | ORAL_TABLET | Freq: Every day | ORAL | 1 refills | Status: DC

## 2022-02-19 ENCOUNTER — Ambulatory Visit (INDEPENDENT_AMBULATORY_CARE_PROVIDER_SITE_OTHER): Payer: Medicare Other | Admitting: Psychology

## 2022-02-19 DIAGNOSIS — F411 Generalized anxiety disorder: Secondary | ICD-10-CM

## 2022-02-19 NOTE — Progress Notes (Addendum)
Sharon Cole is a 66 y.o. female patient   02/19/2022  Treatment Plan: Diagnosis 296.32 (Major depressive affective disorder, recurrent episode, moderate) [n/a]  300.02 (Generalized anxiety disorder) [n/a]  Symptoms Depressed or irritable mood. (Status: maintained) -- No Description Entered  Feelings of hopelessness, worthlessness, or inappropriate guilt. (Status: maintained) -- No Description Entered  Lack of energy. (Status: maintained) -- No Description Entered  Low self-esteem. (Status: maintained) -- No Description Entered  Medication Status compliance  Safety none  If Suicidal or Homicidal State Action Taken: unspecified  Current Risk: low Medications Abilify (Dosage: .71m)  Buspar (Dosage: 327m  Citalopram (Dosage: 201m Topomax (Dosage: unknown)  Objectives Related Problem: Recognize, accept, and cope with feelings of depression. Description: Identify and replace thoughts and beliefs that support depression. Target Date: 2022-09-12 Frequency: Daily Modality: individual Progress: 60%  Related Problem: Recognize, accept, and cope with feelings of depression. Description: Learn and implement behavioral strategies to overcome depression. Target Date: 2022-09-12 Frequency: Daily Modality: individual Progress: 60%  Related Problem: Recognize, accept, and cope with feelings of depression. Description: Verbalize an understanding and resolution of current interpersonal problems. Target Date: 2022-09-12 Frequency: Daily Modality: individual Progress: 80%  Related Problem: Recognize, accept, and cope with feelings of depression. Description: Verbalize insight into how past relationships may be influencing current experiences with depression. Target Date: 2022-09-12 Frequency: Daily Modality: individual Progress: 80%  Client Response full compliance  Service Location Location, [address] [place] [id number] Code cpt [id number]/Reframe  Facilitate problem solving  Identify/label emotions  Validate/empathize  Emotion regulation skills  Self care activities  Lifestyle change (exercise, nutrition)  Self-monitoring  Identified an insight  Rationally challenge thoughts or beliefs/cognitive restructuring  Session notes:  F33.2  Goals: Wants to work on being genuine and being satisfied with herself and develop stronger self-esteem. Also, would like to improve her primary relationship and have her interpersonal and romantic needs met. This goal is now met, as she had ended the relationship. Wants to continue self-care and maintain weight loss. Needs to develop strategy to manage relationship with her son Sharon Cole struggles with mental health issues. Goal date 12-23. 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 Will also attempt to create a small, but satisfying social network with like-minded people. Goal date is 12-23.   Meds: Topamax, Citalopram (82m48mBuspar, Abilify 5mg 61mtient agrees to video Webex session. She is at home and I am at my home office.   Sharon Cole says that she has her dental appointment today. She is worried about the financial piece and today is the treatment plan. She will be very anxious about the actual procedure. She hasn't talked to her brother but is hoping he will help her financially. Her day to day anxiety is higher than it has been in a long time. The financial strain is unavoidable and she struggles to manage, but doesn't feel it is at the level of needing medication. It has been helpful to journal/list her worries. Her ex-husband texted her to say "Sharon's lease is up and he does not want to renew". Sharon Cole't talked to his father in two months. He sent Sharon Sharon Schoonerxt to inform him that the lease is up and asked him about his plan. We talked about the need for her to let this be resolved between Sharon Cole father. Told her about CBT-[facility].  Sharon Morel, PhD  Time: 1:10p-2:00p 50 minutes.

## 2022-02-26 ENCOUNTER — Ambulatory Visit (INDEPENDENT_AMBULATORY_CARE_PROVIDER_SITE_OTHER): Payer: Medicare Other | Admitting: Psychology

## 2022-02-26 DIAGNOSIS — F411 Generalized anxiety disorder: Secondary | ICD-10-CM | POA: Diagnosis not present

## 2022-02-26 NOTE — Progress Notes (Addendum)
Sharon Cole is a 66 y.o. female patient   02/26/2022  Treatment Plan: Diagnosis 296.32 (Major depressive affective disorder, recurrent episode, moderate) [n/a]  300.02 (Generalized anxiety disorder) [n/a]  Symptoms Depressed or irritable mood. (Status: maintained) -- No Description Entered  Feelings of hopelessness, worthlessness, or inappropriate guilt. (Status: maintained) -- No Description Entered  Lack of energy. (Status: maintained) -- No Description Entered  Low self-esteem. (Status: maintained) -- No Description Entered  Medication Status compliance  Safety none  If Suicidal or Homicidal State Action Taken: unspecified  Current Risk: low Medications Abilify (Dosage: .50m)  Buspar (Dosage: 318m  Citalopram (Dosage: 2062m Topomax (Dosage: unknown)  Objectives Related Problem: Recognize, accept, and cope with feelings of depression. Description: Identify and replace thoughts and beliefs that support depression. Target Date: 2022-09-12 Frequency: Daily Modality: individual Progress: 60%  Related Problem: Recognize, accept, and cope with feelings of depression. Description: Learn and implement behavioral strategies to overcome depression. Target Date: 2022-09-12 Frequency: Daily Modality: individual Progress: 60%  Related Problem: Recognize, accept, and cope with feelings of depression. Description: Verbalize an understanding and resolution of current interpersonal problems. Target Date: 2022-09-12 Frequency: Daily Modality: individual Progress: 80%  Related Problem: Recognize, accept, and cope with feelings of depression. Description: Verbalize insight into how past relationships may be influencing current experiences with depression. Target Date: 2022-09-12 Frequency: Daily Modality: individual Progress: 80%  Client Response full compliance  Service Location Location, [address] [place] [id number] Code cpt [id number]/Reframe  Facilitate problem solving  Identify/label emotions  Validate/empathize  Emotion regulation skills  Self care activities  Lifestyle change (exercise, nutrition)  Self-monitoring  Identified an insight  Rationally challenge thoughts or beliefs/cognitive restructuring  Session notes:  F33.2  Goals: Wants to work on being genuine and being satisfied with herself and develop stronger self-esteem. Also, would like to improve her primary relationship and have her interpersonal and romantic needs met. This goal is now met, as she had ended the relationship. Wants to continue self-care and maintain weight loss. Needs to develop strategy to manage relationship with her son Sharon Browho struggles with mental health issues. Goal date 12-23. 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 Will also attempt to create a small, but satisfying social network with like-minded people. Goal date is 12-23.   Meds: Topamax, Citalopram (18m70mBuspar, Abilify 5mg 81mtient agrees to video Webex session. She is at home and I am at my home office.   Sharon Cole says her dental appointment was "awful". The enormity of the cost and scope of work to be done was very upsetting. She spoke to Sharon West Endhe wants to "save the day" by sending the plan to friends that are dentists. The soonest he dentist can do the work is January 16th and she is scheduled. We talked about how to tolerate the time between now and that service date. She states that her ex-husband Sharon Cole is worried about what to do with their son Sharon Cole Waunita Schoonerhis financial situation. Sharon Cole iSuzi Rootsoing her best to stay out of the middle. We talked about her lack of motivation and her sense of powerlessness. This has contributed to depressive feeling. We explored causes behind her social isolation and the potential for her to seek social connections. Her interactions at work have helped her feel more at ease.  Sharon Morel, PhD  Time: 4:10p-5:00p 50 minutes.

## 2022-03-05 ENCOUNTER — Ambulatory Visit: Payer: BLUE CROSS/BLUE SHIELD | Admitting: Psychology

## 2022-03-12 ENCOUNTER — Ambulatory Visit (INDEPENDENT_AMBULATORY_CARE_PROVIDER_SITE_OTHER): Payer: Medicare Other | Admitting: Psychology

## 2022-03-12 DIAGNOSIS — F411 Generalized anxiety disorder: Secondary | ICD-10-CM | POA: Diagnosis not present

## 2022-03-12 NOTE — Progress Notes (Signed)
Sharon Cole is a 66 y.o. female patient   03/12/2022  Treatment Plan: Diagnosis 296.32 (Major depressive affective disorder, recurrent episode, moderate) [n/a]  300.02 (Generalized anxiety disorder) [n/a]  Symptoms Depressed or irritable mood. (Status: maintained) -- No Description Entered  Feelings of hopelessness, worthlessness, or inappropriate guilt. (Status: maintained) -- No Description Entered  Lack of energy. (Status: maintained) -- No Description Entered  Low self-esteem. (Status: maintained) -- No Description Entered  Medication Status compliance  Safety none  If Suicidal or Homicidal State Action Taken: unspecified  Current Risk: low Medications Abilify (Dosage: .25mg)  Buspar (Dosage: 30mg)  Citalopram (Dosage: 20mg)  Topomax (Dosage: unknown)  Objectives Related Problem: Recognize, accept, and cope with feelings of depression. Description: Identify and replace thoughts and beliefs that support depression. Target Date: 2022-09-12 Frequency: Daily Modality: individual Progress: 60%  Related Problem: Recognize, accept, and cope with feelings of depression. Description: Learn and implement behavioral strategies to overcome depression. Target Date: 2022-09-12 Frequency: Daily Modality: individual Progress: 60%  Related Problem: Recognize, accept, and cope with feelings of depression. Description: Verbalize an understanding and resolution of current interpersonal problems. Target Date: 2022-09-12 Frequency: Daily Modality: individual Progress: 80%  Related Problem: Recognize, accept, and cope with feelings of depression. Description: Verbalize insight into how past relationships may be influencing current experiences with depression. Target Date: 2022-09-12 Frequency: Daily Modality: individual Progress: 80%  Client Response full compliance  Service Location Location, [address] [place], [place] [id number]  Service Code cpt 90834P   Normalize/Reframe  Facilitate problem solving  Identify/label emotions  Validate/empathize  Emotion regulation skills  Self care activities  Lifestyle change (exercise, nutrition)  Self-monitoring  Identified an insight  Rationally challenge thoughts or beliefs/cognitive restructuring  Session notes:  F33.2  Goals: Wants to work on being genuine and being satisfied with herself and develop stronger self-esteem. Also, would like to improve her primary relationship and have her interpersonal and romantic needs met. This goal is now met, as she had ended the relationship. Wants to continue self-care and maintain weight loss. Needs to develop strategy to manage relationship with her son , who struggles with mental health issues. Goal date 12-23. 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 Will also attempt to create a small, but satisfying social network with like-minded people. Goal date is 12-23.   Meds: Topamax, Citalopram (20mg), Buspar, Abilify 5mg  Patient agrees to video Webex session. She is at home and I am at my home office.   Sharon Cole says that she spent a lot of times hanging out with son and his family over the weekend. She has been thinking about some ways to spend her non-work /free time. We had talked about starting to think about this at the last session. Shge is thinking about the refugee center or holding babies in the NICU. She says the biggest problem is her fluctuating schedule. She will go on-line to get information about volunteering at different organizations. Sharon Cole is very frustrated with her lack of weight loss. There is a guy at work that has highlighted her lack of interest in men and relationships. She says "the relationship with Sharon Cole really did a number on me". She feels it turned her off to a physical relationship and depleted her libido. She says this is the heaviest she has ever been. Recognizes that the weight becomes an  expression of her lack of desire for female companionship. Need to explore what success would   mean.                                                                                                                           Sharon Morel, PhD  Time: 4:10p-5:00p 50 minutes.

## 2022-03-17 ENCOUNTER — Other Ambulatory Visit (HOSPITAL_COMMUNITY): Payer: Self-pay | Admitting: Psychiatry

## 2022-03-17 DIAGNOSIS — F411 Generalized anxiety disorder: Secondary | ICD-10-CM

## 2022-03-17 DIAGNOSIS — F331 Major depressive disorder, recurrent, moderate: Secondary | ICD-10-CM

## 2022-03-19 ENCOUNTER — Ambulatory Visit (INDEPENDENT_AMBULATORY_CARE_PROVIDER_SITE_OTHER): Payer: Medicare Other | Admitting: Psychology

## 2022-03-19 DIAGNOSIS — F411 Generalized anxiety disorder: Secondary | ICD-10-CM | POA: Diagnosis not present

## 2022-03-19 NOTE — Progress Notes (Signed)
Sharon Cole is a 66 y.o. female patient   03/19/2022  Treatment Plan: Diagnosis 296.32 (Major depressive affective disorder, recurrent episode, moderate) [n/a]  300.02 (Generalized anxiety disorder) [n/a]  Symptoms Depressed or irritable mood. (Status: maintained) -- No Description Entered  Feelings of hopelessness, worthlessness, or inappropriate guilt. (Status: maintained) -- No Description Entered  Lack of energy. (Status: maintained) -- No Description Entered  Low self-esteem. (Status: maintained) -- No Description Entered  Medication Status compliance  Safety none  If Suicidal or Homicidal State Action Taken: unspecified  Current Risk: low Medications Abilify (Dosage: .94m)  Buspar (Dosage: 352m  Citalopram (Dosage: 2056m Topomax (Dosage: unknown)  Objectives Related Problem: Recognize, accept, and cope with feelings of depression. Description: Identify and replace thoughts and beliefs that support depression. Target Date: 2022-09-12 Frequency: Daily Modality: individual Progress: 60%  Related Problem: Recognize, accept, and cope with feelings of depression. Description: Learn and implement behavioral strategies to overcome depression. Target Date: 2022-09-12 Frequency: Daily Modality: individual Progress: 60%  Related Problem: Recognize, accept, and cope with feelings of depression. Description: Verbalize an understanding and resolution of current interpersonal problems. Target Date: 2022-09-12 Frequency: Daily Modality: individual Progress: 80%  Related Problem: Recognize, accept, and cope with feelings of depression. Description: Verbalize insight into how past relationships may be influencing current experiences with depression. Target Date: 2022-09-12 Frequency: Daily Modality: individual Progress: 80%  Client Response full compliance  Service Location Location, [address] [place] [id number] Code cpt [id number]/Reframe  Facilitate problem solving  Identify/label emotions  Validate/empathize  Emotion regulation skills  Self care activities  Lifestyle change (exercise, nutrition)  Self-monitoring  Identified an insight  Rationally challenge thoughts or beliefs/cognitive restructuring  Session notes:  F33.2  Goals: Wants to work on being genuine and being satisfied with herself and develop stronger self-esteem. Also, would like to improve her primary relationship and have her interpersonal and romantic needs met. This goal is now met, as she had ended the relationship. Wants to continue self-care and maintain weight loss. Needs to develop strategy to manage relationship with her son [name] struggles with mental health issues. Goal date 12-23. 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 Will also attempt to create a small, but satisfying social network with like-minded people. Goal date is 12-23.   Meds: Topamax, Citalopram (85m17mBuspar, Abilify 5mg 6mtient agrees to video Webex session. She is at home and I am at my home office.   Sharon Cole talked about dealing with work, which has some management problems. She says she is still struggling with motivation and focus. She feels "nothing is that important" and she tends not to get much done. In many ways, she just gets by. She has "no sense of urgency" and that has been a problem for her for much of her life. She doesn't feels especially motivated to change. She says, "I have a job and I have had to lower my standards to be satisfied". She says she is isolated and doesn't mind being isolated. She says she thinks volunteering is the thing that will make her feel more satisfied. Having trouble initiating efforts to get information on volunteer opportunities. She also did not get Sharon Cole information. She thinks she needs to "just schedule" herself to get things done. She does say  it would be have  to have a "companion".                                                                                                                                [name], PhD  Time: 1:10p-2:00p 50 minutes.

## 2022-03-26 ENCOUNTER — Ambulatory Visit (INDEPENDENT_AMBULATORY_CARE_PROVIDER_SITE_OTHER): Payer: Medicare Other | Admitting: Psychology

## 2022-03-26 DIAGNOSIS — F411 Generalized anxiety disorder: Secondary | ICD-10-CM

## 2022-03-26 NOTE — Progress Notes (Signed)
Sharon Cole is a 66 y.o. female patient   03/26/2022  Treatment Plan: Diagnosis 296.32 (Major depressive affective disorder, recurrent episode, moderate) [n/a]  300.02 (Generalized anxiety disorder) [n/a]  Symptoms Depressed or irritable mood. (Status: maintained) -- No Description Entered  Feelings of hopelessness, worthlessness, or inappropriate guilt. (Status: maintained) -- No Description Entered  Lack of energy. (Status: maintained) -- No Description Entered  Low self-esteem. (Status: maintained) -- No Description Entered  Medication Status compliance  Safety none  If Suicidal or Homicidal State Action Taken: unspecified  Current Risk: low Medications Abilify (Dosage: .39m)  Buspar (Dosage: 322m  Citalopram (Dosage: 2045m Topomax (Dosage: unknown)  Objectives Related Problem: Recognize, accept, and cope with feelings of depression. Description: Identify and replace thoughts and beliefs that support depression. Target Date: 2022-09-12 Frequency: Daily Modality: individual Progress: 60%  Related Problem: Recognize, accept, and cope with feelings of depression. Description: Learn and implement behavioral strategies to overcome depression. Target Date: 2022-09-12 Frequency: Daily Modality: individual Progress: 60%  Related Problem: Recognize, accept, and cope with feelings of depression. Description: Verbalize an understanding and resolution of current interpersonal problems. Target Date: 2022-09-12 Frequency: Daily Modality: individual Progress: 80%  Related Problem: Recognize, accept, and cope with feelings of depression. Description: Verbalize insight into how past relationships may be influencing current experiences with depression. Target Date: 2022-09-12 Frequency: Daily Modality: individual Progress: 80%  Client Response full compliance  Service Location Location, [address]  [place] [id number] Code cpt [phone]/Reframe  Facilitate problem solving  Identify/label emotions  Validate/empathize  Emotion regulation skills  Self care activities  Lifestyle change (exercise, nutrition)  Self-monitoring  Identified an insight  Rationally challenge thoughts or beliefs/cognitive restructuring  Session notes:  F33.2  Goals: Wants to work on being genuine and being satisfied with herself and develop stronger self-esteem. Also, would like to improve her primary relationship and have her interpersonal and romantic needs met. This goal is now met, as she had ended the relationship. Wants to continue self-care and maintain weight loss. Needs to develop strategy to manage relationship with her son Sharon Browho struggles with mental health issues. Goal date 12-23. 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 Will also attempt to create a small, but satisfying social network with like-minded people. Goal date is 12-23.   Meds: Topamax, Citalopram (59m29mBuspar, Abilify 5mg 11mtient agrees to video Webex session. She is at home and I am at my home office.   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 She is having her dental work scheduled for Jan. 16th, but hasn't determined the level of work to be done. She has to work out finanChartered certified accountant Sharon Cole.Sharon Severinid's father signed lease to keep him in apartment for a few more months. She says that her moods are unchanged in that she feels her previous "go to" sources for inspiration are not working. She thinks she needs a "different source of inspiration" is needed. Wants to look at the newer generation of self-help. We talked about her dental work being the start of a self-care journey. It represents the first time in a long while that she will start feeling good about herself. Hoping  that the dental repair leads to her efforts to  then lose weight and improve her self-esteem and confidence. In this regard, she will not necessarily need to seek external motivation.                                                                                                                                      Sharon Morel, PhD  Time: 4:10p-5:00p 50 minutes.

## 2022-04-02 ENCOUNTER — Ambulatory Visit (INDEPENDENT_AMBULATORY_CARE_PROVIDER_SITE_OTHER): Payer: Medicare Other | Admitting: Psychology

## 2022-04-02 ENCOUNTER — Encounter (HOSPITAL_COMMUNITY): Payer: Self-pay | Admitting: Psychiatry

## 2022-04-02 ENCOUNTER — Telehealth (HOSPITAL_BASED_OUTPATIENT_CLINIC_OR_DEPARTMENT_OTHER): Payer: Medicare Other | Admitting: Psychiatry

## 2022-04-02 DIAGNOSIS — F325 Major depressive disorder, single episode, in full remission: Secondary | ICD-10-CM

## 2022-04-02 DIAGNOSIS — F411 Generalized anxiety disorder: Secondary | ICD-10-CM | POA: Diagnosis not present

## 2022-04-02 DIAGNOSIS — F331 Major depressive disorder, recurrent, moderate: Secondary | ICD-10-CM

## 2022-04-02 MED ORDER — ARIPIPRAZOLE 5 MG PO TABS: 5.0000 mg | ORAL_TABLET | Freq: Every day | ORAL | 1 refills | Status: DC

## 2022-04-02 MED ORDER — ESCITALOPRAM OXALATE 20 MG PO TABS: 20.0000 mg | ORAL_TABLET | Freq: Every day | ORAL | 1 refills | Status: DC

## 2022-04-02 MED ORDER — BUSPIRONE HCL 30 MG PO TABS: 30.0000 mg | ORAL_TABLET | Freq: Two times a day (BID) | ORAL | 1 refills | Status: DC

## 2022-04-02 NOTE — Progress Notes (Signed)
[facility] MD/PA/NP OP Progress Note  Sharon Cole  MRN:  [id number]   04/02/2022   Chief Complaint: Medication management appointment HPI: This was a phone based appointment for medication management.  Patient's identity confirmed using 2 different identifiers.  Limitations associated with this type of communication have been reviewed.  Location of parties [facility]  Duration 20 minutes    66 year-old female, history of anxiety/depression.  Has been diagnosed with GAD and MDD in the past .  Sharon Cole reports she has been doing well . She lives alone but sees her adult son and grandchildren often. She states she leads a " quiet life" by choice and enjoys it . She reports she continues to work full time, doing well at work. Issues concerning her youngest son have been a recurrent stressor ( currently unemployed, she at times assists him financially )  She describes mood as stable , denies depression, and currently presents euthymic .  No significant neurovegetative symptoms are endorsed .  Anxiety symptoms well controlled . Denies medication side effects. States current medications are well tolerated and helpful. Denies side effects.  Side effects reviewed, including potential risk of drug drug interactions/serotonin syndrome and potential for motor side effects/ akathisia/ TD on Abilify. She denies any side effects.    Writer will be leaving clinic in March 2024 . After that, if she wants, she will continue to be followed by another [facility] clinic psychiatrist . She expresses understanding .       Visit Diagnosis: MDD by history Past Psychiatric History:   Past Medical History:  Past Medical History:  Diagnosis Date   Anxiety    Bradycardia    Common migraine with intractable migraine 07/03/2016   Depression    Dizziness    Headache    Menopause    Syncope     Past Surgical History:  Procedure Laterality Date   BACK SURGERY     cyst removal     BREAST SURGERY     breast reduction   BUNIONECTOMY     CHOLECYSTECTOMY     KNEE ARTHROSCOPY      Family Psychiatric History:   Family History:  Family History  Problem Relation Age of Onset   Heart disease Mother    Heart disease Brother    Heart disease Maternal Grandmother    Heart disease Maternal Grandfather    Cancer Son        unknown    Social History:  Social History   Socioeconomic History   Marital status: Significant Other    Spouse name: Not on file   Number of children: 2   Years of education: Masters   Highest education level: Not on file  Occupational History   Not on file  Tobacco Use   Smoking status: Never   Smokeless tobacco: Never  Vaping Use   Vaping Use: Never used  Substance and Sexual Activity   Alcohol use: Yes    Alcohol/week: 1.0 standard drink of alcohol    Types: 1 Glasses of wine per week    Comment: daily   Drug use: No   Sexual activity: Yes    Partners: Male  Other Topics Concern   Not on file  Social History Narrative   Lives   Caffeine use:    Drinks 16oz caffeine drinks a day    Social Determinants of Radio broadcast assistant Strain: Not on file  Food Insecurity: Not on file  Transportation Needs: Not  on file  Physical Activity: Not on file  Stress: Not on file  Social Connections: Not on file    Allergies: No Known Allergies  Metabolic Disorder Labs: Lab Results  Component Value Date   HGBA1C 5.7 (H) 02/05/2022   No results found for: "PROLACTIN" Lab Results  Component Value Date   CHOL 192 02/05/2022   TRIG 98 02/05/2022   HDL 67 02/05/2022   CHOLHDL 2.9 02/05/2022   VLDL 18.2 01/02/2015   LDLCALC 108 (H) 02/05/2022   LDLCALC 96 01/02/2015   Lab Results  Component Value Date   TSH 1.760 02/05/2022   TSH 1.04 01/02/2015    Therapeutic Level Labs: No results found for: "LITHIUM" No results found for: "VALPROATE" No results found for: "CBMZ"  Current Medications: Current Outpatient  Medications  Medication Sig Dispense Refill   ARIPiprazole (ABILIFY) 5 MG tablet Take 1 tablet (5 mg total) by mouth daily. 30 tablet 1   busPIRone (BUSPAR) 30 MG tablet Take 1 tablet (30 mg total) by mouth 2 (two) times daily. 60 tablet 1   calcium-vitamin D (OSCAL WITH D) 250-125 MG-UNIT tablet Take 1 tablet by mouth daily.     escitalopram (LEXAPRO) 20 MG tablet Take 1 tablet (20 mg total) by mouth daily. 30 tablet 1   Multiple Vitamins-Minerals (MULTIVITAMIN PO) Take 1 tablet by mouth daily.     omeprazole (PRILOSEC) 10 MG capsule Take 10 mg by mouth daily.     Rimegepant Sulfate (NURTEC) 75 MG TBDP Take 75 mg by mouth as needed (take 1 at onset of headache, max is 1 tablet in 24 hours). 8 tablet 11   No current facility-administered medications for this visit.       Psychiatric Specialty Exam: Please note limitations in obtaining full mental status exam in the context of phone communication ROS- does not endorse  There were no vitals taken for this visit.There is no height or weight on file to calculate BMI.  General Appearance: NA  Eye Contact: N/A  Speech:  Normal Rate  Volume:  Normal  Mood: denies depression, characterizes as stable, Euthymic  Affect: reactive, full in range  Thought Process:  Linear and Descriptions of Associations: Intact  Orientation:  Full (Time, Place, and Person)  Thought Content:  No hallucinations, no delusions    Suicidal Thoughts:  No no SI, future oriented  Homicidal Thoughts:  No  Memory:   Recent and remote grossly intact  Judgement:  Other:  Present  Insight:  Present  Psychomotor Activity:  NA  Concentration:  Concentration: Good and Attention Span: Good  Recall:  Good  Fund of Knowledge: Good  Language: Good  Akathisia:  Negative  Handed:  Right  AIMS (if indicated): No involuntary or abnormal movements noted  Assets:  Communication Skills Desire for Improvement Resilience  ADL's:  Intact  Cognition: WNL  Sleep:  Good    Screenings:   Assessment and Plan:   66 year-old female with a history of depression/anxiety.   Continues to do well in daily activities , including home and work activities . Reports she has been doing well and describes stable mood and presents euthymic . Anxiety symptoms have also tended to improve . Tolerating medication regimen  well,without  side effects and with good clinical response .   We have reviewed side effect profile to include potential risk of motor /metabolic side effects, NMS , potential risk of serotonin syndrome )  Writer will be leaving clinic in March 2024 , after which  she , if she wants, will continue to be followed by another clinic psychiatrist - expresses understanding    PLAN    Will see in about 6- 8 weeks , agrees to contact clinic sooner if any worsening or medication concerns prior.  Continue current medication regimen.  (Buspar 30 mgrs BID, Lexapro 20 mgrs QDAY, Abilify 5 mg QDAY )  Continue individual psychotherapy         F Olanna Percifield MD  Patient ID: Sharon Cole, female   DOB: [birth year], 66 y.o.   MRN: [id number]

## 2022-04-02 NOTE — Progress Notes (Signed)
Sharon Cole is a 66 y.o. female patient   04/02/2022  Treatment Plan: Diagnosis 296.32 (Major depressive affective disorder, recurrent episode, moderate) [n/a]  300.02 (Generalized anxiety disorder) [n/a]  Symptoms Depressed or irritable mood. (Status: maintained) -- No Description Entered  Feelings of hopelessness, worthlessness, or inappropriate guilt. (Status: maintained) -- No Description Entered  Lack of energy. (Status: maintained) -- No Description Entered  Low self-esteem. (Status: maintained) -- No Description Entered  Medication Status compliance  Safety none  If Suicidal or Homicidal State Action Taken: unspecified  Current Risk: low Medications Abilify (Dosage: .36m)  Buspar (Dosage: 321m  Citalopram (Dosage: 20106m Topomax (Dosage: unknown)  Objectives Related Problem: Recognize, accept, and cope with feelings of depression. Description: Identify and replace thoughts and beliefs that support depression. Target Date: 2022-09-12 Frequency: Daily Modality: individual Progress: 60%  Related Problem: Recognize, accept, and cope with feelings of depression. Description: Learn and implement behavioral strategies to overcome depression. Target Date: 2022-09-12 Frequency: Daily Modality: individual Progress: 60%  Related Problem: Recognize, accept, and cope with feelings of depression. Description: Verbalize an understanding and resolution of current interpersonal problems. Target Date: 2022-09-12 Frequency: Daily Modality: individual Progress: 80%  Related Problem: Recognize, accept, and cope with feelings of depression. Description: Verbalize insight into how past relationships may be influencing current experiences with depression. Target Date: 2022-09-12 Frequency: Daily Modality: individual Progress: 80%  Client Response full compliance  Service Location Location, [address] [place] [id number] Code cpt [id number]/Reframe  Facilitate problem solving  Identify/label emotions  Validate/empathize  Emotion regulation skills  Self care activities  Lifestyle change (exercise, nutrition)  Self-monitoring  Identified an insight  Rationally challenge thoughts or beliefs/cognitive restructuring  Session notes:  F33.2  Goals: Wants to work on being genuine and being satisfied with herself and develop stronger self-esteem. Also, would like to improve her primary relationship and have her interpersonal and romantic needs met. This goal is now met, as she had ended the relationship. Wants to continue self-care and maintain weight loss. Needs to develop strategy to manage relationship with her son [name] struggles with mental health issues. Goal date 12-23. 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 Will also attempt to create a small, but satisfying social network with like-minded people. Goal date is 12-23.   Meds: Topamax, Citalopram (44m71mBuspar, Abilify 5mg 77mtient agrees to video Webex session. She is at home and I am at my home office.   Sharon Cole says that Thanksgiving was nice and that her son DavidShanon Browed up. He has lost a lot of weight with intermittent fasting and increased exercise. This is the thinnest she has seen him. She said he did not talk a lot at the gathering, but had spoken to his brother before they all got together. Conversation was mostly superficial.  States that she spoke (briefly) with Sharon Dominica Severinsent him the financial information. She is excited to get the dental work done, but still fearful of the process. She says that the loan process is a challenge and she is hoping to get the $45,000 from the bank where Sharon Dominica Severins. 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 She agrees and plans to initiate today.  Sharon Morel, PhD  Time: 4:10p-5:00p 50 minutes.

## 2022-04-09 ENCOUNTER — Ambulatory Visit (INDEPENDENT_AMBULATORY_CARE_PROVIDER_SITE_OTHER): Payer: Medicare Other | Admitting: Psychology

## 2022-04-09 DIAGNOSIS — F325 Major depressive disorder, single episode, in full remission: Secondary | ICD-10-CM | POA: Diagnosis not present

## 2022-04-09 NOTE — Progress Notes (Signed)
Sharon Cole is a 66 y.o. female patient   04/09/2022  Treatment Plan: Diagnosis 296.32 (Major depressive affective disorder, recurrent episode, moderate) [n/a]  300.02 (Generalized anxiety disorder) [n/a]  Symptoms Depressed or irritable mood. (Status: maintained) -- No Description Entered  Feelings of hopelessness, worthlessness, or inappropriate guilt. (Status: maintained) -- No Description Entered  Lack of energy. (Status: maintained) -- No Description Entered  Low self-esteem. (Status: maintained) -- No Description Entered  Medication Status compliance  Safety none  If Suicidal or Homicidal State Action Taken: unspecified  Current Risk: low Medications Abilify (Dosage: .39m)  Buspar (Dosage: 356m  Citalopram (Dosage: 2069m Topomax (Dosage: unknown)  Objectives Related Problem: Recognize, accept, and cope with feelings of depression. Description: Identify and replace thoughts and beliefs that support depression. Target Date: 2022-09-12 Frequency: Daily Modality: individual Progress: 60%  Related Problem: Recognize, accept, and cope with feelings of depression. Description: Learn and implement behavioral strategies to overcome depression. Target Date: 2022-09-12 Frequency: Daily Modality: individual Progress: 60%  Related Problem: Recognize, accept, and cope with feelings of depression. Description: Verbalize an understanding and resolution of current interpersonal problems. Target Date: 2022-09-12 Frequency: Daily Modality: individual Progress: 80%  Related Problem: Recognize, accept, and cope with feelings of depression. Description: Verbalize insight into how past relationships may be influencing current experiences with depression. Target Date: 2022-09-12 Frequency: Daily Modality: individual Progress: 80%  Client Response full compliance  Service Location Location, [address] [place] [id number] Code cpt [id number]/Reframe  Facilitate problem solving  Identify/label emotions  Validate/empathize  Emotion regulation skills  Self care activities  Lifestyle change (exercise, nutrition)  Self-monitoring  Identified an insight  Rationally challenge thoughts or beliefs/cognitive restructuring  Session notes:  F33.2  Goals: Wants to work on being genuine and being satisfied with herself and develop stronger self-esteem. Also, would like to improve her primary relationship and have her interpersonal and romantic needs met. This goal is now met, as she had ended the relationship. Wants to continue self-care and maintain weight loss. Needs to develop strategy to manage relationship with her son [name] struggles with mental health issues. Goal date 12-23. 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 Will also attempt to create a small, but satisfying social network with like-minded people. Goal date is 12-23.   Meds: Topamax, Citalopram (30m23mBuspar, Abilify 5mg 10mtient agrees to video Webex session. She is at home and I am at my home office.   [name] iSuzi Rootsatching the grandchildren while her son and daughter in [place] will have closing papers for her loan tomorrow which she will use to get money for her dental care. 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 She did, however, take care of some important errands and felt productive. She now feels ready to do the art work and hopes that once her child care responsibility is done, she can get started. She does report that she still feels depressed but that it is mainly related to her weight. Says she is at her highest weight and the number is overwhelming. We discussed ways to establish a mindset that will contribute to her dieting success.  [name], PhD  Time: 4:10p-5:00p 50 minutes.

## 2022-04-16 ENCOUNTER — Ambulatory Visit: Payer: BLUE CROSS/BLUE SHIELD | Admitting: Psychology

## 2022-04-23 ENCOUNTER — Ambulatory Visit (INDEPENDENT_AMBULATORY_CARE_PROVIDER_SITE_OTHER): Payer: Medicare Other | Admitting: Psychology

## 2022-04-23 DIAGNOSIS — F325 Major depressive disorder, single episode, in full remission: Secondary | ICD-10-CM

## 2022-04-23 NOTE — Progress Notes (Signed)
Sharon Cole is a 67 y.o. female patient   04/23/2022  Treatment Plan: Diagnosis 296.32 (Major depressive affective disorder, recurrent episode, moderate) [n/a]  300.02 (Generalized anxiety disorder) [n/a]  Symptoms Depressed or irritable mood. (Status: maintained) -- No Description Entered  Feelings of hopelessness, worthlessness, or inappropriate guilt. (Status: maintained) -- No Description Entered  Lack of energy. (Status: maintained) -- No Description Entered  Low self-esteem. (Status: maintained) -- No Description Entered  Medication Status compliance  Safety none  If Suicidal or Homicidal State Action Taken: unspecified  Current Risk: low Medications Abilify (Dosage: .1m)  Buspar (Dosage: 318m  Citalopram (Dosage: 204m Topomax (Dosage: unknown)  Objectives Related Problem: Recognize, accept, and cope with feelings of depression. Description: Identify and replace thoughts and beliefs that support depression. Target Date: 2022-09-12 Frequency: Daily Modality: individual Progress: 60%  Related Problem: Recognize, accept, and cope with feelings of depression. Description: Learn and implement behavioral strategies to overcome depression. Target Date: 2022-09-12 Frequency: Daily Modality: individual Progress: 60%  Related Problem: Recognize, accept, and cope with feelings of depression. Description: Verbalize an understanding and resolution of current interpersonal problems. Target Date: 2022-09-12 Frequency: Daily Modality: individual Progress: 80%  Related Problem: Recognize, accept, and cope with feelings of depression. Description: Verbalize insight into how past relationships may be influencing current experiences with depression. Target Date: 2022-09-12 Frequency: Daily Modality: individual Progress: 80%  Client Response full compliance  Service Location Location, [address] [place] [id number] Code cpt [id number]/Reframe  Facilitate problem solving  Identify/label emotions  Validate/empathize  Emotion regulation skills  Self care activities  Lifestyle change (exercise, nutrition)  Self-monitoring  Identified an insight  Rationally challenge thoughts or beliefs/cognitive restructuring  Session notes:  F33.2  Goals: Wants to work on being genuine and being satisfied with herself and develop stronger self-esteem. Also, would like to improve her primary relationship and have her interpersonal and romantic needs met. This goal is now met, as she had ended the relationship. Wants to continue self-care and maintain weight loss. Needs to develop strategy to manage relationship with her son Sharon Cole struggles with mental health issues. Goal date 12-23. 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 Will also attempt to create a small, but satisfying social network with like-minded people. Goal date is 12-23.   Meds: Topamax, Citalopram (74m3mBuspar, Abilify 5mg 70mtient agrees to video Webex session. She is at home and I am at my home office.   Sharon Cole went to the dentist and got a scan. She has also made payment arrangements. She was pleased that her son Sharon Cole through with child care responsibilities with his nephews. She is very frustrated that she is not losing weight in spite of significant efforts. We talked about alternatives to what she has been doing that his not helping her to lose weight. This is an ongoing frustration.  [name], PhD  Time: 4:10p-5:00p 50 minutes.

## 2022-04-30 ENCOUNTER — Ambulatory Visit: Payer: BLUE CROSS/BLUE SHIELD | Admitting: Psychology

## 2022-04-30 ENCOUNTER — Ambulatory Visit: Payer: Medicare Other | Admitting: Psychology

## 2022-05-02 ENCOUNTER — Telehealth (HOSPITAL_COMMUNITY): Payer: Medicare Other | Admitting: Psychiatry

## 2022-05-07 ENCOUNTER — Ambulatory Visit (INDEPENDENT_AMBULATORY_CARE_PROVIDER_SITE_OTHER): Payer: Medicare Other | Admitting: Psychology

## 2022-05-07 DIAGNOSIS — F325 Major depressive disorder, single episode, in full remission: Secondary | ICD-10-CM | POA: Diagnosis not present

## 2022-05-07 NOTE — Progress Notes (Signed)
Sharon Cole is a 67 y.o. female patient   05/07/2022  Treatment Plan: Diagnosis 296.32 (Major depressive affective disorder, recurrent episode, moderate) [n/a]  300.02 (Generalized anxiety disorder) [n/a]  Symptoms Depressed or irritable mood. (Status: maintained) -- No Description Entered  Feelings of hopelessness, worthlessness, or inappropriate guilt. (Status: maintained) -- No Description Entered  Lack of energy. (Status: maintained) -- No Description Entered  Low self-esteem. (Status: maintained) -- No Description Entered  Medication Status compliance  Safety none  If Suicidal or Homicidal State Action Taken: unspecified  Current Risk: low Medications Abilify (Dosage: .75m)  Buspar (Dosage: 358m  Citalopram (Dosage: 2092m Topomax (Dosage: unknown)  Objectives Related Problem: Recognize, accept, and cope with feelings of depression. Description: Identify and replace thoughts and beliefs that support depression. Target Date: 2022-09-12 Frequency: Daily Modality: individual Progress: 60%  Related Problem: Recognize, accept, and cope with feelings of depression. Description: Learn and implement behavioral strategies to overcome depression. Target Date: 2022-09-12 Frequency: Daily Modality: individual Progress: 60%  Related Problem: Recognize, accept, and cope with feelings of depression. Description: Verbalize an understanding and resolution of current interpersonal problems. Target Date: 2022-09-12 Frequency: Daily Modality: individual Progress: 80%  Related Problem: Recognize, accept, and cope with feelings of depression. Description: Verbalize insight into how past relationships may be influencing current experiences with depression. Target Date: 2022-09-12 Frequency: Daily Modality: individual Progress: 80%  Client Response full compliance  Service  Location Location, [address] Sharon Cole [id number] Code cpt [phone]/Reframe  Facilitate problem solving  Identify/label emotions  Validate/empathize  Emotion regulation skills  Self care activities  Lifestyle change (exercise, nutrition)  Self-monitoring  Identified an insight  Rationally challenge thoughts or beliefs/cognitive restructuring  Session notes:  F33.2  Goals: Wants to work on being genuine and being satisfied with herself and develop stronger self-esteem. Also, would like to improve her primary relationship and have her interpersonal and romantic needs met. This goal is now met, as she had ended the relationship. Wants to continue self-care and maintain weight loss. Needs to develop strategy to manage relationship with her son Sharon Cole struggles with mental health issues. Goal date 12-23. 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 Will also attempt to create a small, but satisfying social network with like-minded people. Goal date is 12-23.   Meds: Topamax, Citalopram (21m21mBuspar, Abilify 5mg 28mtient agrees to video Webex session. She is at home and I am at my home office.   Sharon iSuzi Rootsalking about her dental work scheduled for January 16 and desire to get her will in place. Discussed trying to keep herself distracted. Started a discussion about her motivation to change and in what areas. She seeks some companionship, but claims she is really not lonely. We will discuss further and focus on those areas she feels most motivated change.  Sharon Morel, PhD  Time: 4:10p-5:00p 50 minutes.

## 2022-05-13 ENCOUNTER — Encounter (HOSPITAL_COMMUNITY): Payer: Self-pay | Admitting: Psychiatry

## 2022-05-13 ENCOUNTER — Telehealth (HOSPITAL_BASED_OUTPATIENT_CLINIC_OR_DEPARTMENT_OTHER): Payer: Medicare Other | Admitting: Psychiatry

## 2022-05-13 DIAGNOSIS — F411 Generalized anxiety disorder: Secondary | ICD-10-CM | POA: Diagnosis not present

## 2022-05-13 DIAGNOSIS — F331 Major depressive disorder, recurrent, moderate: Secondary | ICD-10-CM

## 2022-05-13 DIAGNOSIS — F325 Major depressive disorder, single episode, in full remission: Secondary | ICD-10-CM

## 2022-05-13 MED ORDER — BUSPIRONE HCL 30 MG PO TABS: 30.0000 mg | ORAL_TABLET | Freq: Two times a day (BID) | ORAL | 1 refills | Status: DC

## 2022-05-13 MED ORDER — ESCITALOPRAM OXALATE 20 MG PO TABS: 20.0000 mg | ORAL_TABLET | Freq: Every day | ORAL | 1 refills | Status: DC

## 2022-05-13 MED ORDER — ARIPIPRAZOLE 5 MG PO TABS: 5.0000 mg | ORAL_TABLET | Freq: Every day | ORAL | 1 refills | Status: DC

## 2022-05-13 NOTE — Progress Notes (Signed)
BH MD/PA/NP OP Progress Note  Sharon Cole  MRN:  [id number]   04/02/2022   Chief Complaint: Medication management appointment HPI: This was a phone based appointment for medication management.  Patient's identity confirmed using 2 different identifiers.  Limitations associated with this type of communication have been reviewed.  Location of parties [facility]  Duration 20 minutes    66 year-old female, history of anxiety/depression.  Has been diagnosed with GAD and MDD in the past .  Patient reports she has been doing well /functioning well in daily activities including at work and home life. She describes mood as stable and presents euthymic. Does not endorse signficant anhedonia or neuro-vegetative symptoms at this time. No SI/future oriented. Denies medication side effects .  She reports that she will have significant dental work done later this month ( removal of some teeth with eventual goal of implants ) . Describes some anxiety about this but also feeling motivated as has been looking forward to this dental work for a long time .  She continues to see her therapist for individual psychotherapy on a regular basis.  Although denies depression she states that it is difficult for her to acknowledge her accomplishments and feel happy about them.   Coping skills/ego strengths have been reviewed.  She has made significant changes/progress over the last year or two,  which have been reviewed: For example, she terminated a relationship she felt was not good for her, even though she was conflicted about this at the time.  She now lives alone and is financially independent.  She started a new job and finding that it did not pay enough she switched to another job where she is better compensated.  She has made a sustained effort in procuring financial/insurance resources to have dental work done which she has now accomplished and has pending dental work as  above.  She denies medication side effects.  She feels current medication regimen has been effective and well-tolerated.  She has tolerated the combination well without reported drug-drug interactions.  Does not endorse any abnormal or involuntary movements. Side effects have been reviewed: Potential motor (such as TD) and metabolic side effects on Abilify, potential for serotonin syndrome on medication combination.  I have stressed the importance of letting her other providers, including her dentist, know about her current medications to minimize any potential drug-drug interaction risk.  We again reviewed termination issues.  Writer will be leaving clinic in March 2024 .  She expresses understanding.      Visit Diagnosis: MDD by history Past Psychiatric History:   Past Medical History:  Past Medical History:  Diagnosis Date   Anxiety    Bradycardia    Common migraine with intractable migraine 07/03/2016   Depression    Dizziness    Headache    Menopause    Syncope     Past Surgical History:  Procedure Laterality Date   BACK SURGERY     cyst removal    BREAST SURGERY     breast reduction   BUNIONECTOMY     CHOLECYSTECTOMY     KNEE ARTHROSCOPY      Family Psychiatric History:   Family History:  Family History  Problem Relation Age of Onset   Heart disease Mother    Heart disease Brother    Heart disease Maternal Grandmother    Heart disease Maternal Grandfather    Cancer Son        unknown    Social History:    Social History   Socioeconomic History   Marital status: Significant Other    Spouse name: Not on file   Number of children: 2   Years of education: Masters   Highest education level: Not on file  Occupational History   Not on file  Tobacco Use   Smoking status: Never   Smokeless tobacco: Never  Vaping Use   Vaping Use: Never used  Substance and Sexual Activity   Alcohol use: Yes    Alcohol/week: 1.0 standard drink of alcohol    Types: 1  Glasses of wine per week    Comment: daily   Drug use: No   Sexual activity: Yes    Partners: Male  Other Topics Concern   Not on file  Social History Narrative   Lives   Caffeine use:    Drinks 16oz caffeine drinks a day    Social Determinants of Health   Financial Resource Strain: Not on file  Food Insecurity: Not on file  Transportation Needs: Not on file  Physical Activity: Not on file  Stress: Not on file  Social Connections: Not on file    Allergies: No Known Allergies  Metabolic Disorder Labs: Lab Results  Component Value Date   HGBA1C 5.7 (H) 02/05/2022   No results found for: "PROLACTIN" Lab Results  Component Value Date   CHOL 192 02/05/2022   TRIG 98 02/05/2022   HDL 67 02/05/2022   CHOLHDL 2.9 02/05/2022   VLDL 18.2 01/02/2015   LDLCALC 108 (H) 02/05/2022   LDLCALC 96 01/02/2015   Lab Results  Component Value Date   TSH 1.760 02/05/2022   TSH 1.04 01/02/2015    Therapeutic Level Labs: No results found for: "LITHIUM" No results found for: "VALPROATE" No results found for: "CBMZ"  Current Medications: Current Outpatient Medications  Medication Sig Dispense Refill   ARIPiprazole (ABILIFY) 5 MG tablet Take 1 tablet (5 mg total) by mouth daily. 30 tablet 1   busPIRone (BUSPAR) 30 MG tablet Take 1 tablet (30 mg total) by mouth 2 (two) times daily. 60 tablet 1   calcium-vitamin D (OSCAL WITH D) 250-125 MG-UNIT tablet Take 1 tablet by mouth daily.     escitalopram (LEXAPRO) 20 MG tablet Take 1 tablet (20 mg total) by mouth daily. 30 tablet 1   Multiple Vitamins-Minerals (MULTIVITAMIN PO) Take 1 tablet by mouth daily.     omeprazole (PRILOSEC) 10 MG capsule Take 10 mg by mouth daily.     Rimegepant Sulfate (NURTEC) 75 MG TBDP Take 75 mg by mouth as needed (take 1 at onset of headache, max is 1 tablet in 24 hours). 8 tablet 11   No current facility-administered medications for this visit.       Psychiatric Specialty Exam: Please note limitations  in obtaining full mental status exam in the context of phone communication ROS- does not endorse  There were no vitals taken for this visit.There is no height or weight on file to calculate BMI.  General Appearance: NA  Eye Contact: N/A  Speech:  Normal Rate  Volume:  Normal  Mood: Does not endorse depression and presents euthymic  Affect: reactive, full in range  Thought Process:  Linear and Descriptions of Associations: Intact  Orientation:  Full (Time, Place, and Person)  Thought Content:  No hallucinations, no delusions    Suicidal Thoughts:  No no SI, future oriented  Homicidal Thoughts:  No  Memory:   Recent and remote grossly intact  Judgement:  Other:  Present    Insight:  Present  Psychomotor Activity:  NA  Concentration:  Concentration: Good and Attention Span: Good  Recall:  Good  Fund of Knowledge: Good  Language: Good  Akathisia:  Negative  Handed:  Right  AIMS (if indicated): No involuntary or abnormal movements noted  Assets:  Communication Skills Desire for Improvement Resilience  ADL's:  Intact  Cognition: WNL  Sleep:  Good   Screenings:   Assessment and Plan:   66 year-old female with a history of depression/anxiety.   Currently reports stable mood and presents euthymic.  Reports she is doing well in her daily activities.  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  She does not endorse significant anhedonia or neurovegetative symptoms at this time.  Describes upcoming significant dental procedure later this month involving removal of a number of her teeth with eventual goal of replacing them with implants.  She has some anxiety about this upcoming procedure but expresses motivation and has been looking forward to dental work being done.  Continues to see therapist for individual psychotherapy on an ongoing basis. She has tolerated current medication regimen well without side effects and with good response. Continue current meds.   Side effects reviewed.  As above, I have encouraged her to let her DDS know about all the medications she is taking, including psychiatric medications, to minimize the risk of potential drug-drug interactions or emerging side effects. Writer will be leaving clinic in March 2024 .  Have reviewed with patient, who expressed understanding.   PLAN    Will see in about 6 weeks , agrees to contact clinic sooner if any worsening or medication concerns prior.  Continue current medication regimen.  (Buspar 30 mgrs BID, Lexapro 20 mgrs QDAY, Abilify 5 mg QDAY )  Continue individual psychotherapy         F Kanae Ignatowski MD  Patient ID: Sharon Cole, female   DOB: 10/30/1955, 66 y.o.   MRN: [id number]  

## 2022-05-14 ENCOUNTER — Ambulatory Visit (INDEPENDENT_AMBULATORY_CARE_PROVIDER_SITE_OTHER): Payer: Medicare Other | Admitting: Psychology

## 2022-05-14 DIAGNOSIS — F325 Major depressive disorder, single episode, in full remission: Secondary | ICD-10-CM | POA: Diagnosis not present

## 2022-05-14 NOTE — Progress Notes (Signed)
Sharon Cole is a 67 y.o. female patient   05/14/2022  Treatment Plan: Diagnosis 296.32 (Major depressive affective disorder, recurrent episode, moderate) [n/a]  300.02 (Generalized anxiety disorder) [n/a]  Symptoms Depressed or irritable mood. (Status: maintained) -- No Description Entered  Feelings of hopelessness, worthlessness, or inappropriate guilt. (Status: maintained) -- No Description Entered  Lack of energy. (Status: maintained) -- No Description Entered  Low self-esteem. (Status: maintained) -- No Description Entered  Medication Status compliance  Safety none  If Suicidal or Homicidal State Action Taken: unspecified  Current Risk: low Medications Abilify (Dosage: .'25mg'$ )  Buspar (Dosage: '30mg'$ )  Citalopram (Dosage: '20mg'$ )  Topomax (Dosage: unknown)  Objectives Related Problem: Recognize, accept, and cope with feelings of depression. Description: Identify and replace thoughts and beliefs that support depression. Target Date: 2022-09-12 Frequency: Daily Modality: individual Progress: 60%  Related Problem: Recognize, accept, and cope with feelings of depression. Description: Learn and implement behavioral strategies to overcome depression. Target Date: 2022-09-12 Frequency: Daily Modality: individual Progress: 60%  Related Problem: Recognize, accept, and cope with feelings of depression. Description: Verbalize an understanding and resolution of current interpersonal problems. Target Date: 2022-09-12 Frequency: Daily Modality: individual Progress: 80%  Related Problem: Recognize, accept, and cope with feelings of depression. Description: Verbalize insight into how past relationships may be influencing current experiences with depression. Target Date: 2022-09-12 Frequency: Daily Modality: individual Progress: 80%  Client Response full  compliance  Service Location Location, [name] [place], [place] [address]  Service Code cpt [phone]  Normalize/Reframe  Facilitate problem solving  Identify/label emotions  Validate/empathize  Emotion regulation skills  Self care activities  Lifestyle change (exercise, nutrition)  Self-monitoring  Identified an insight  Rationally challenge thoughts or beliefs/cognitive restructuring  Session notes:  F33.2  Goals: Wants to work on being genuine and being satisfied with herself and develop stronger self-esteem. Also, would like to improve her primary relationship and have her interpersonal and romantic needs met. This goal is now met, as she had ended the relationship. Wants to continue self-care and maintain weight loss. Needs to develop strategy to manage relationship with her son Sharon Cole, who struggles with mental health issues. Goal date 12-23. 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 Will also attempt to create a small, but satisfying social network with like-minded people. Goal date is 12-23.   Meds: Topamax, Citalopram ('20mg'$ ), Buspar, Abilify '5mg'$   Patient agrees to video Webex session. She is at home and I am at my home office.   Sharon Cole says she is "pretty good". Her dental procedure was moved by two days (later). She had to make schedule adjustments. She is working hard to just not think about the procedure. She may spend a couple of days at [employer], but will play it by ear.  She brought up her level of comfort with her "current existence". In particular, her isolation. Companionship is what she is most interested in developing. At the same time, she greatly values her individual time and seeks time alone. She does say that when she is with someone she really enjoys, she is much more  invested in being with that person. She had 2 prior relationships with men with whom she had a lot in common. She is unclear if she wants that for herself  now.                                                                                                                                                                [name], PhD  Time: 4:10p-5:00p 50 minutes.

## 2022-05-21 ENCOUNTER — Ambulatory Visit (INDEPENDENT_AMBULATORY_CARE_PROVIDER_SITE_OTHER): Payer: Medicare Other | Admitting: Psychology

## 2022-05-21 DIAGNOSIS — F325 Major depressive disorder, single episode, in full remission: Secondary | ICD-10-CM | POA: Diagnosis not present

## 2022-05-21 NOTE — Progress Notes (Signed)
Sharon Cole is a 67 y.o. female patient   05/21/2022  Treatment Plan: Diagnosis 296.32 (Major depressive affective disorder, recurrent episode, moderate) [n/a]  300.02 (Generalized anxiety disorder) [n/a]  Symptoms Depressed or irritable mood. (Status: maintained) -- No Description Entered  Feelings of hopelessness, worthlessness, or inappropriate guilt. (Status: maintained) -- No Description Entered  Lack of energy. (Status: maintained) -- No Description Entered  Low self-esteem. (Status: maintained) -- No Description Entered  Medication Status compliance  Safety none  If Suicidal or Homicidal State Action Taken: unspecified  Current Risk: low Medications Abilify (Dosage: .'25mg'$ )  Buspar (Dosage: '30mg'$ )  Citalopram (Dosage: '20mg'$ )  Topomax (Dosage: unknown)  Objectives Related Problem: Recognize, accept, and cope with feelings of depression. Description: Identify and replace thoughts and beliefs that support depression. Target Date: 2022-09-12 Frequency: Daily Modality: individual Progress: 60%  Related Problem: Recognize, accept, and cope with feelings of depression. Description: Learn and implement behavioral strategies to overcome depression. Target Date: 2022-09-12 Frequency: Daily Modality: individual Progress: 60%  Related Problem: Recognize, accept, and cope with feelings of depression. Description: Verbalize an understanding and resolution of current interpersonal problems. Target Date: 2022-09-12 Frequency: Daily Modality: individual Progress: 80%  Related Problem: Recognize, accept, and cope with feelings of depression. Description: Verbalize insight into how past relationships may be influencing current experiences with depression. Target Date: 2022-09-12 Frequency: Daily Modality: individual Progress: 80%  Client Response full compliance  Service Location Location, [name] [place], [place] [address]  Service Code cpt [phone]   Normalize/Reframe  Facilitate problem solving  Identify/label emotions  Validate/empathize  Emotion regulation skills  Self care activities  Lifestyle change (exercise, nutrition)  Self-monitoring  Identified an insight  Rationally challenge thoughts or beliefs/cognitive restructuring  Session notes:  F33.2  Goals: Wants to work on being genuine and being satisfied with herself and develop stronger self-esteem. Also, would like to improve her primary relationship and have her interpersonal and romantic needs met. This goal is now met, as she had ended the relationship. Wants to continue self-care and maintain weight loss. Needs to develop strategy to manage relationship with her son Sharon Cole, who struggles with mental health issues. Goal date 12-23. 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 Will also attempt to create a small, but satisfying social network with like-minded people. Goal date is 12-23.   Meds: Topamax, Citalopram ('20mg'$ ), Buspar, Abilify '5mg'$   Patient agrees to video Webex session. She is at home and I am at my home office.   Sharon Cole is having her dental surgery in 2 days and the reality is starting to settle in. She is bracing herself for the shock of not having any teeth. She has been working a lot lately, but off today and tomorrow. Her niece had a baby on the 13th (girl). Sharon Cole has not seen the baby and will not until after her dental surgery. Sharon Cole states that she looked at a dating site, but determined she is "not ready" to explore relationships. We discussed this being related to self-esteem and lack of confidence. Open to the idea that this may be altered after her dental procedures. She is hopeful that her negative sense of self will improve.  [name], PhD  Time:  4:10p-5:00p 50 minutes.

## 2022-05-28 ENCOUNTER — Ambulatory Visit (INDEPENDENT_AMBULATORY_CARE_PROVIDER_SITE_OTHER): Payer: Medicare Other | Admitting: Psychology

## 2022-05-28 DIAGNOSIS — F325 Major depressive disorder, single episode, in full remission: Secondary | ICD-10-CM | POA: Diagnosis not present

## 2022-05-28 NOTE — Progress Notes (Signed)
Sharon Cole is a 67 y.o. female patient   05/28/2022  Treatment Plan: Diagnosis 296.32 (Major depressive affective disorder, recurrent episode, moderate) [n/a]  300.02 (Generalized anxiety disorder) [n/a]  Symptoms Depressed or irritable mood. (Status: maintained) -- No Description Entered  Feelings of hopelessness, worthlessness, or inappropriate guilt. (Status: maintained) -- No Description Entered  Lack of energy. (Status: maintained) -- No Description Entered  Low self-esteem. (Status: maintained) -- No Description Entered  Medication Status compliance  Safety none  If Suicidal or Homicidal State Action Taken: unspecified  Current Risk: low Medications Abilify (Dosage: .'25mg'$ )  Buspar (Dosage: '30mg'$ )  Citalopram (Dosage: '20mg'$ )  Topomax (Dosage: unknown)  Objectives Related Problem: Recognize, accept, and cope with feelings of depression. Description: Identify and replace thoughts and beliefs that support depression. Target Date: 2022-10-13 Frequency: Daily Modality: individual Progress: 60%  Related Problem: Recognize, accept, and cope with feelings of depression. Description: Learn and implement behavioral strategies to overcome depression. Target Date: 2022-10-13 Frequency: Daily Modality: individual Progress: 60%  Related Problem: Recognize, accept, and cope with feelings of depression. Description: Verbalize an understanding and resolution of current interpersonal problems. Target Date: 2022-10-13 Frequency: Daily Modality: individual Progress: 80%  Related Problem: Recognize, accept, and cope with feelings of depression. Description: Verbalize insight into how past relationships may be influencing current experiences with depression. Target Date: 2022-10-13 Frequency: Daily Modality: individual Progress: 80%  Client Response full compliance  Service Location Location, [name] [place], [place] [address]  Service Code cpt [phone]   Normalize/Reframe  Facilitate problem solving  Identify/label emotions  Validate/empathize  Emotion regulation skills  Self care activities  Lifestyle change (exercise, nutrition)  Self-monitoring  Identified an insight  Rationally challenge thoughts or beliefs/cognitive restructuring  Session notes:  F33.2  Goals: Wants to work on being genuine and being satisfied with herself and develop stronger self-esteem. Also, would like to improve her primary relationship and have her interpersonal and romantic needs met. This goal is now met, as she had ended the relationship. Wants to continue self-care and maintain weight loss. Needs to develop strategy to manage relationship with her son Sharon Cole, who struggles with mental health issues. Goal date 06-24. 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 Will also attempt to create a small, but satisfying social network with like-minded people. Goal date is 06-24.   Meds: Topamax, Citalopram ('20mg'$ ), Buspar, Abilify '5mg'$   Patient agrees to video Webex session. She is at home and I am at my home office.   [name] had her dental surgery and she did well. The pain has been manageable. She is taking an extra couple of days off of work and plans to return on Friday. She is extremely relieved to have this completed. At some point, she will have to have the bottom teeth repaired. She is feeling positive about her decision to get this work done. She is hoping to be feeling more motivated once she has gotten used to the dentures. We talked about her greater confidence leading to taking more social risks.  [name], PhD  Time: 4:10p-5:00p 50 minutes.

## 2022-06-04 ENCOUNTER — Ambulatory Visit: Payer: Medicare Other | Admitting: Psychology

## 2022-06-11 ENCOUNTER — Ambulatory Visit: Payer: Medicare Other | Admitting: Psychology

## 2022-06-18 ENCOUNTER — Ambulatory Visit (INDEPENDENT_AMBULATORY_CARE_PROVIDER_SITE_OTHER): Payer: Medicare Other | Admitting: Psychology

## 2022-06-18 DIAGNOSIS — F325 Major depressive disorder, single episode, in full remission: Secondary | ICD-10-CM | POA: Diagnosis not present

## 2022-06-18 NOTE — Progress Notes (Signed)
Sharon Cole is a 67 y.o. female patient   06/18/2022  Treatment Plan: Diagnosis 296.32 (Major depressive affective disorder, recurrent episode, moderate) [n/a]  300.02 (Generalized anxiety disorder) [n/a]  Symptoms Depressed or irritable mood. (Status: maintained) -- No Description Entered  Feelings of hopelessness, worthlessness, or inappropriate guilt. (Status: maintained) -- No Description Entered  Lack of energy. (Status: maintained) -- No Description Entered  Low self-esteem. (Status: maintained) -- No Description Entered  Medication Status compliance  Safety none  If Suicidal or Homicidal State Action Taken: unspecified  Current Risk: low Medications Abilify (Dosage: .29m)  Buspar (Dosage: 360m  Citalopram (Dosage: 2029m Topomax (Dosage: unknown)  Objectives Related Problem: Recognize, accept, and cope with feelings of depression. Description: Identify and replace thoughts and beliefs that support depression. Target Date: 2022-10-13 Frequency: Daily Modality: individual Progress: 60%  Related Problem: Recognize, accept, and cope with feelings of depression. Description: Learn and implement behavioral strategies to overcome depression. Target Date: 2022-10-13 Frequency: Daily Modality: individual Progress: 60%  Related Problem: Recognize, accept, and cope with feelings of depression. Description: Verbalize an understanding and resolution of current interpersonal problems. Target Date: 2022-10-13 Frequency: Daily Modality: individual Progress: 80%  Related Problem: Recognize, accept, and cope with feelings of depression. Description: Verbalize insight into how past relationships may be influencing current experiences with depression. Target Date: 2022-10-13 Frequency: Daily Modality: individual Progress: 80%  Client Response full compliance  Service Location Location, [address] [place] [id number]  Code cpt [phone]/Reframe  Facilitate problem solving  Identify/label emotions  Validate/empathize  Emotion regulation skills  Self care activities  Lifestyle change (exercise, nutrition)  Self-monitoring  Identified an insight  Rationally challenge thoughts or beliefs/cognitive restructuring  Session notes:  F33.2  Goals: Wants to work on being genuine and being satisfied with herself and develop stronger self-esteem. Also, would like to improve her primary relationship and have her interpersonal and romantic needs met. This goal is now met, as she had ended the relationship. Wants to continue self-care and maintain weight loss. Needs to develop strategy to manage relationship with her son Sharon Cole struggles with mental health issues. Goal date 06-24. 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 Will also attempt to create a small, but satisfying social network with like-minded people. Goal date is 06-24.   Meds: Topamax, Citalopram (79m46mBuspar, Abilify 5mg 39mtient agrees to video Webex session. She is at home and I am at my home office.   [name] is feeling better but still frustrated with some of her limitations. She is considering having the rest of the dental work sometime in the next few months.  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 She tends to not be efficient with her time. She is feeling optimistic she can do this.  Sharon Morel, PhD  Time: 4:10p-5:00p 50 minutes.

## 2022-06-24 ENCOUNTER — Telehealth (HOSPITAL_BASED_OUTPATIENT_CLINIC_OR_DEPARTMENT_OTHER): Payer: Medicare Other | Admitting: Psychiatry

## 2022-06-24 ENCOUNTER — Encounter (HOSPITAL_COMMUNITY): Payer: Self-pay | Admitting: Psychiatry

## 2022-06-24 DIAGNOSIS — F411 Generalized anxiety disorder: Secondary | ICD-10-CM

## 2022-06-24 DIAGNOSIS — F331 Major depressive disorder, recurrent, moderate: Secondary | ICD-10-CM

## 2022-06-24 DIAGNOSIS — F325 Major depressive disorder, single episode, in full remission: Secondary | ICD-10-CM

## 2022-06-24 MED ORDER — BUSPIRONE HCL 30 MG PO TABS: 30.0000 mg | ORAL_TABLET | Freq: Two times a day (BID) | ORAL | 1 refills | Status: DC

## 2022-06-24 MED ORDER — ESCITALOPRAM OXALATE 20 MG PO TABS: 20.0000 mg | ORAL_TABLET | Freq: Every day | ORAL | 1 refills | Status: DC

## 2022-06-24 MED ORDER — ARIPIPRAZOLE 5 MG PO TABS: 5.0000 mg | ORAL_TABLET | Freq: Every day | ORAL | 1 refills | Status: DC

## 2022-06-24 NOTE — Progress Notes (Signed)
[facility] MD/PA/NP OP Progress Note  Sharon Cole  MRN:  [id number]   04/02/2022   Chief Complaint: Medication management appointment HPI: This was a phone based appointment for medication management.  Patient's identity confirmed using 2 different identifiers.  Limitations associated with this type of communication have been reviewed.  [facility]  Duration 20 minutes    67 year-old female, history of anxiety/depression.  Has been diagnosed with GAD and MDD in the past .  Patient reports she has been doing well [place] well in daily activities including at work and home life. She describes mood as stable and presents euthymic. Does not endorse signficant anhedonia or neuro-vegetative symptoms at this time. No SI/future oriented. Denies medication side effects .  She reports that she will have significant dental work done later this month ( removal of some teeth with eventual goal of implants ) . Describes some anxiety about this but also feeling motivated as has been looking forward to this dental work for a long time .  She continues to see her therapist for individual psychotherapy on a regular basis.  Although denies depression she states that it is difficult for her to acknowledge her accomplishments and feel Sharon about them.   Coping skills/ego strengths have been reviewed.  She has made significant changes/progress over the last year or two,  which have been reviewed: For example, she terminated a relationship she felt was not good for her, even though she was conflicted about this at the time.  She now lives alone and is financially independent.  She started a new job and finding that it did not pay enough she switched to another job where she is better compensated.  She has made a sustained effort in procuring financial/insurance resources to have dental work done which she has now accomplished and has pending dental work as  above.  She denies medication side effects.  She feels current medication regimen has been effective and well-tolerated.  She has tolerated the combination well without reported drug-drug interactions.  Does not endorse any abnormal or involuntary movements. Side effects have been reviewed: Potential motor (such as TD) and metabolic side effects on Abilify, potential for serotonin syndrome on medication combination.  I have stressed the importance of letting her other providers, including her dentist, know about her current medications to minimize any potential drug-drug interaction risk.  We again reviewed termination issues.  Writer will be leaving clinic in March 2024 .  She expresses understanding.      Visit Diagnosis: MDD by history Past Psychiatric History:   Past Medical History:  Past Medical History:  Diagnosis Date   Anxiety    Bradycardia    Common migraine with intractable migraine 07/03/2016   Depression    Dizziness    Headache    Menopause    Syncope     Past Surgical History:  Procedure Laterality Date   BACK SURGERY     cyst removal    BREAST SURGERY     breast reduction   BUNIONECTOMY     CHOLECYSTECTOMY     KNEE ARTHROSCOPY      Family Psychiatric History:   Family History:  Family History  Problem Relation Age of Onset   Heart disease Mother    Heart disease Brother    Heart disease Maternal Grandmother    Heart disease Maternal Grandfather    Cancer Son        unknown    Social History:  Social History   Socioeconomic History   Marital status: Significant Other    Spouse name: Not on file   Number of children: 2   Years of education: Masters   Highest education level: Not on file  Occupational History   Not on file  Tobacco Use   Smoking status: Never   Smokeless tobacco: Never  Vaping Use   Vaping Use: Never used  Substance and Sexual Activity   Alcohol use: Yes    Alcohol/week: 1.0 standard drink of alcohol    Types: 1  Glasses of wine per week    Comment: daily   Drug use: No   Sexual activity: Yes    Partners: Male  Other Topics Concern   Not on file  Social History Narrative   Lives   Caffeine use:    Drinks 16oz caffeine drinks a day    Social Determinants of Health   Financial Resource Strain: Not on file  Food Insecurity: Not on file  Transportation Needs: Not on file  Physical Activity: Not on file  Stress: Not on file  Social Connections: Not on file    Allergies: No Known Allergies  Metabolic Disorder Labs: Lab Results  Component Value Date   HGBA1C 5.7 (H) 02/05/2022   No results found for: "PROLACTIN" Lab Results  Component Value Date   CHOL 192 02/05/2022   TRIG 98 02/05/2022   HDL 67 02/05/2022   CHOLHDL 2.9 02/05/2022   VLDL 18.2 01/02/2015   LDLCALC 108 (H) 02/05/2022   LDLCALC 96 01/02/2015   Lab Results  Component Value Date   TSH 1.760 02/05/2022   TSH 1.04 01/02/2015    Therapeutic Level Labs: No results found for: "LITHIUM" No results found for: "VALPROATE" No results found for: "CBMZ"  Current Medications: Current Outpatient Medications  Medication Sig Dispense Refill   ARIPiprazole (ABILIFY) 5 MG tablet Take 1 tablet (5 mg total) by mouth daily. 30 tablet 1   busPIRone (BUSPAR) 30 MG tablet Take 1 tablet (30 mg total) by mouth 2 (two) times daily. 60 tablet 1   calcium-vitamin D (OSCAL WITH D) 250-125 MG-UNIT tablet Take 1 tablet by mouth daily.     escitalopram (LEXAPRO) 20 MG tablet Take 1 tablet (20 mg total) by mouth daily. 30 tablet 1   Multiple Vitamins-Minerals (MULTIVITAMIN PO) Take 1 tablet by mouth daily.     omeprazole (PRILOSEC) 10 MG capsule Take 10 mg by mouth daily.     Rimegepant Sulfate (NURTEC) 75 MG TBDP Take 75 mg by mouth as needed (take 1 at onset of headache, max is 1 tablet in 24 hours). 8 tablet 11   No current facility-administered medications for this visit.       Psychiatric Specialty Exam: Please note limitations  in obtaining full mental status exam in the context of phone communication ROS- does not endorse  There were no vitals taken for this visit.There is no height or weight on file to calculate BMI.  General Appearance: NA  Eye Contact: N/A  Speech:  Normal Rate  Volume:  Normal  Mood: Does not endorse depression and presents euthymic  Affect: reactive, full in range  Thought Process:  Linear and Descriptions of Associations: Intact  Orientation:  Full (Time, Place, and Person)  Thought Content:  No hallucinations, no delusions    Suicidal Thoughts:  No no SI, future oriented  Homicidal Thoughts:  No  Memory:   Recent and remote grossly intact  Judgement:  Other:  Present  Insight:  Present  Psychomotor Activity:  NA  Concentration:  Concentration: Good and Attention Span: Good  Recall:  Good  Fund of Knowledge: Good  Language: Good  Akathisia:  Negative  Handed:  Right  AIMS (if indicated): No involuntary or abnormal movements noted  Assets:  Communication Skills Desire for Improvement Resilience  ADL's:  Intact  Cognition: WNL  Sleep:  Good   Screenings:   Assessment and Plan:   67 year-old female with a history of depression/anxiety.   Currently reports stable mood and presents euthymic.  Reports she is doing well in her daily activities.  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  She does not endorse significant anhedonia or neurovegetative symptoms at this time.  Describes upcoming significant dental procedure later this month involving removal of a number of her teeth with eventual goal of replacing them with implants.  She has some anxiety about this upcoming procedure but expresses motivation and has been looking forward to dental work being done.  Continues to see therapist for individual psychotherapy on an ongoing basis. She has tolerated current medication regimen well without side effects and with good response. Continue current meds.   Side effects reviewed.  As above, I have encouraged her to let her DDS know about all the medications she is taking, including psychiatric medications, to minimize the risk of potential drug-drug interactions or emerging side effects. Writer will be leaving clinic in March 2024 .  Have reviewed with patient, who expressed understanding.   PLAN    Will see in about 6 weeks , agrees to contact clinic sooner if any worsening or medication concerns prior.  Continue current medication regimen.  (Buspar 30 mgrs BID, Lexapro 20 mgrs QDAY, Abilify 5 mg QDAY )  Continue individual psychotherapy         F Ryszard Socarras MD  Patient ID: Sharon Cole, female   DOB: 03/03/1956, 67 y.o.   MRN: [id number]

## 2022-06-24 NOTE — Progress Notes (Signed)
[facility] MD/PA/NP OP Progress Note  RASHONDA BARROGA  MRN:  [id number]  06/24/2022   Chief Complaint: Medication management appointment HPI: This was a phone based appointment for medication management.  Patient's identity confirmed using 2 different identifiers.  Limitations associated with this type of communication have been reviewed.  Location of [facility]  Duration 20 minutes    67 year-old female, history of anxiety/depression.  Has been diagnosed with GAD and MDD in the past .  Ms Goos reports she has been doing well . 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 She reports her mood has been " all right"/ stable and does not endorse depression , anhedonia or significant neuro-vegetative symptoms. Describes anxiety symptoms as generally well controlled . She reports she recently started major dental work (  complete removal of teeth, with plan for implants later this year ). She had upper teeth extracted and is using a temporary denture in the meantime which she states is uncomfortable but states she is happy she is getting this work done and looking forward to completing the process later this year . She continues to see her therapist for individual psychotherapy regularly . 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   Denies medication side effects. She feels current medication combination has been helpful and been  well tolerated .  Side effects have been reviewed as well as potential for serotonin syndrome ( has been on same regimen for a long period of time , tolerating well thus far , without side effects )   Writer is leaving clinic in March. Termination issues reviewed . She will continue to see another psychiatrist at this clinic. She expresses understanding .    Visit Diagnosis: MDD by history Past Psychiatric  History:   Past Medical History:  Past Medical History:  Diagnosis Date   Anxiety    Bradycardia    Common migraine with intractable migraine 07/03/2016   Depression    Dizziness    Headache    Menopause    Syncope     Past Surgical History:  Procedure Laterality Date   BACK SURGERY     cyst removal    BREAST SURGERY     breast reduction   BUNIONECTOMY     CHOLECYSTECTOMY     KNEE ARTHROSCOPY      Family Psychiatric History:   Family History:  Family History  Problem Relation Age of Onset   Heart disease Mother    Heart disease Brother    Heart disease Maternal Grandmother    Heart disease Maternal Grandfather    Cancer Son        unknown    Social History:  Social History   Socioeconomic History   Marital status: Significant Other    Spouse name: Not on file   Number of children: 2   Years of education: Masters   Highest education level: Not on file  Occupational History   Not on file  Tobacco Use   Smoking status: Never   Smokeless tobacco: Never  Vaping Use   Vaping Use: Never used  Substance and Sexual Activity   Alcohol use: Yes    Alcohol/week: 1.0 standard drink of alcohol    Types: 1 Glasses of wine per week    Comment: daily   Drug use: No   Sexual activity:  Yes    Partners: Male  Other Topics Concern   Not on file  Social History Narrative   Lives   Caffeine use:    Drinks 16oz caffeine drinks a day    Social Determinants of Health   Financial Resource Strain: Not on file  Food Insecurity: Not on file  Transportation Needs: Not on file  Physical Activity: Not on file  Stress: Not on file  Social Connections: Not on file    Allergies: No Known Allergies  Metabolic Disorder Labs: Lab Results  Component Value Date   HGBA1C 5.7 (H) 02/05/2022   No results found for: "PROLACTIN" Lab Results  Component Value Date   CHOL 192 02/05/2022   TRIG 98 02/05/2022   HDL 67 02/05/2022   CHOLHDL 2.9 02/05/2022   VLDL 18.2 01/02/2015    LDLCALC 108 (H) 02/05/2022   LDLCALC 96 01/02/2015   Lab Results  Component Value Date   TSH 1.760 02/05/2022   TSH 1.04 01/02/2015    Therapeutic Level Labs: No results found for: "LITHIUM" No results found for: "VALPROATE" No results found for: "CBMZ"  Current Medications: Current Outpatient Medications  Medication Sig Dispense Refill   ARIPiprazole (ABILIFY) 5 MG tablet Take 1 tablet (5 mg total) by mouth daily. 30 tablet 1   busPIRone (BUSPAR) 30 MG tablet Take 1 tablet (30 mg total) by mouth 2 (two) times daily. 60 tablet 1   calcium-vitamin D (OSCAL WITH D) 250-125 MG-UNIT tablet Take 1 tablet by mouth daily.     escitalopram (LEXAPRO) 20 MG tablet Take 1 tablet (20 mg total) by mouth daily. 30 tablet 1   Multiple Vitamins-Minerals (MULTIVITAMIN PO) Take 1 tablet by mouth daily.     omeprazole (PRILOSEC) 10 MG capsule Take 10 mg by mouth daily.     Rimegepant Sulfate (NURTEC) 75 MG TBDP Take 75 mg by mouth as needed (take 1 at onset of headache, max is 1 tablet in 24 hours). 8 tablet 11   No current facility-administered medications for this visit.       Psychiatric Specialty Exam: Please note limitations in obtaining full mental status exam in the context of phone communication ROS- does not endorse  There were no vitals taken for this visit.There is no height or weight on file to calculate BMI.  General Appearance: NA  Eye Contact: N/A  Speech:  Normal Rate  Volume:  Normal  Mood: reports mood as stable, euthymic  Affect: reactive, full in range  Thought Process:  Linear and Descriptions of Associations: Intact  Orientation:  Full (Time, Place, and Person)  Thought Content:  No hallucinations, no delusions    Suicidal Thoughts:  No no SI, future oriented  Homicidal Thoughts:  No  Memory:   Recent and remote grossly intact  Judgement:  Other:  Present  Insight:  Present  Psychomotor Activity:  NA  Concentration:  Concentration: Good and Attention Span: Good   Recall:  Good  Fund of Knowledge: Good  Language: Good  Akathisia:  Negative  Handed:  Right  AIMS (if indicated): NA   Assets:  Communication Skills Desire for Improvement Resilience  ADL's:  Intact  Cognition: WNL  Sleep:  Good   Screenings:   Assessment and Plan:   68 year-old female with a history of depression/anxiety.   Currently Ms Cerf is doing well . Functioning well in daily activities including full time employment .  Mood stable /euthymic . No significant neuro-vegetative symptoms reported .  Anxiety symptoms improved .  Tolerating current medication regimen well , without side effects, which have been reviewed.   Writer will be leaving clinic in March 2024 .  She will continue to follow with [facility]  another psychiatrist. She expresses understanding .     PLAN    Next appointment in about 6 weeks ( with Dr Nelida Gores)  , agrees to contact clinic sooner if any worsening or medication concerns prior.  Continue current medication regimen.  (Buspar 30 mgrs BID, Lexapro 20 mgrs QDAY, Abilify 5 mg QDAY )  Continue individual psychotherapy         F Johnanna Bakke MD  Patient ID: NYELLE BAUDER, female   DOB: Apr 02, 1956, 67 y.o.   MRN: [id number]

## 2022-06-25 ENCOUNTER — Ambulatory Visit (INDEPENDENT_AMBULATORY_CARE_PROVIDER_SITE_OTHER): Payer: Medicare Other | Admitting: Psychology

## 2022-06-25 DIAGNOSIS — F325 Major depressive disorder, single episode, in full remission: Secondary | ICD-10-CM | POA: Diagnosis not present

## 2022-06-25 NOTE — Progress Notes (Signed)
Sharon Cole is a 67 y.o. female patient   06/25/2022  Treatment Plan: Diagnosis 296.32 (Major depressive affective disorder, recurrent episode, moderate) [n/a]  300.02 (Generalized anxiety disorder) [n/a]  Symptoms Depressed or irritable mood. (Status: maintained) -- No Description Entered  Feelings of hopelessness, worthlessness, or inappropriate guilt. (Status: maintained) -- No Description Entered  Lack of energy. (Status: maintained) -- No Description Entered  Low self-esteem. (Status: maintained) -- No Description Entered  Medication Status compliance  Safety none  If Suicidal or Homicidal State Action Taken: unspecified  Current Risk: low Medications Abilify (Dosage: .36m)  Buspar (Dosage: 36m  Citalopram (Dosage: 2058m Topomax (Dosage: unknown)  Objectives Related Problem: Recognize, accept, and cope with feelings of depression. Description: Identify and replace thoughts and beliefs that support depression. Target Date: 2022-10-13 Frequency: Daily Modality: individual Progress: 60%  Related Problem: Recognize, accept, and cope with feelings of depression. Description: Learn and implement behavioral strategies to overcome depression. Target Date: 2022-10-13 Frequency: Daily Modality: individual Progress: 60%  Related Problem: Recognize, accept, and cope with feelings of depression. Description: Verbalize an understanding and resolution of current interpersonal problems. Target Date: 2022-10-13 Frequency: Daily Modality: individual Progress: 80%  Related Problem: Recognize, accept, and cope with feelings of depression. Description: Verbalize insight into how past relationships may be influencing current experiences with depression. Target Date: 2022-10-13 Frequency: Daily Modality: individual Progress: 80%  Client Response full compliance  Service Location Location, [address]  [place] [id number] Code cpt [id number]/Reframe  Facilitate problem solving  Identify/label emotions  Validate/empathize  Emotion regulation skills  Self care activities  Lifestyle change (exercise, nutrition)  Self-monitoring  Identified an insight  Rationally challenge thoughts or beliefs/cognitive restructuring  Session notes:  F33.2  Goals: Wants to work on being genuine and being satisfied with herself and develop stronger self-esteem. Also, would like to improve her primary relationship and have her interpersonal and romantic needs met. This goal is now met, as she had ended the relationship. Wants to continue self-care and maintain weight loss. Needs to develop strategy to manage relationship with her son [name] struggles with mental health issues. Goal date 06-24. 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 Will also attempt to create a small, but satisfying social network with like-minded people. Goal date is 06-24.   Meds: Topamax, Citalopram (87m74mBuspar, Abilify 5mg 86mtient agrees to video Webex session. She is at home and I am at my home office.   [name] her dentures are difficult and disappointing. She states that she did not have as much time as she had hoped, so she did not accomplished what she hoped in terms of getting art accomplished. We talked about the need for her to integrate more structure in her life and identify priorities. She becomes overly obsessive and takes "too long" to complete projects. She says she feels "pretty good" lately. Has not changed much of her routine. Says people "stay to themselves" in her complex. Emotions are stable and she does not report any episodes or anxiety or depression.  [name], PhD   Time: 1:10p-2:00p 50 minutes.

## 2022-07-02 ENCOUNTER — Ambulatory Visit: Payer: Medicare Other | Admitting: Psychology

## 2022-07-02 DIAGNOSIS — F325 Major depressive disorder, single episode, in full remission: Secondary | ICD-10-CM

## 2022-07-02 NOTE — Progress Notes (Signed)
Sharon Cole is a 67 y.o. female patient   07/02/2022  Treatment Plan: Diagnosis 296.32 (Major depressive affective disorder, recurrent episode, moderate) [n/a]  300.02 (Generalized anxiety disorder) [n/a]  Symptoms Depressed or irritable mood. (Status: maintained) -- No Description Entered  Feelings of hopelessness, worthlessness, or inappropriate guilt. (Status: maintained) -- No Description Entered  Lack of energy. (Status: maintained) -- No Description Entered  Low self-esteem. (Status: maintained) -- No Description Entered  Medication Status compliance  Safety none  If Suicidal or Homicidal State Action Taken: unspecified  Current Risk: low Medications Abilify (Dosage: .'25mg'$ )  Buspar (Dosage: '30mg'$ )  Citalopram (Dosage: '20mg'$ )  Topomax (Dosage: unknown)  Objectives Related Problem: Recognize, accept, and cope with feelings of depression. Description: Identify and replace thoughts and beliefs that support depression. Target Date: 2022-10-13 Frequency: Daily Modality: individual Progress: 60%  Related Problem: Recognize, accept, and cope with feelings of depression. Description: Learn and implement behavioral strategies to overcome depression. Target Date: 2022-10-13 Frequency: Daily Modality: individual Progress: 60%  Related Problem: Recognize, accept, and cope with feelings of depression. Description: Verbalize an understanding and resolution of current interpersonal problems. Target Date: 2022-10-13 Frequency: Daily Modality: individual Progress: 80%  Related Problem: Recognize, accept, and cope with feelings of depression. Description: Verbalize insight into how past relationships may be influencing current experiences with depression. Target Date: 2022-10-13 Frequency: Daily Modality: individual Progress: 80%  Client Response full compliance  Service  Location Location, [name] [place], [place] [address]  Service Code cpt [id number]  Normalize/Reframe  Facilitate problem solving  Identify/label emotions  Validate/empathize  Emotion regulation skills  Self care activities  Lifestyle change (exercise, nutrition)  Self-monitoring  Identified an insight  Rationally challenge thoughts or beliefs/cognitive restructuring  Session notes:  F33.2  Goals: Wants to work on being genuine and being satisfied with herself and develop stronger self-esteem. Also, would like to improve her primary relationship and have her interpersonal and romantic needs met. This goal is now met, as she had ended the relationship. Wants to continue self-care and maintain weight loss. Needs to develop strategy to manage relationship with her son Sharon Cole, who struggles with mental health issues. Goal date 06-24. 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 Will also attempt to create a small, but satisfying social network with like-minded people. Goal date is 06-24.   Meds: Topamax, Citalopram ('20mg'$ ), Buspar, Abilify '5mg'$   Patient agrees to video Webex session. She is at home and I am at my home office.   Sharon Cole says that work is busy and it is an adjustment being back after her time off. She states that her week was "okay" She has been attempting to work on one of her art projects and "it was very upsetting for her. It brought back memories of having a "different life" when she was retired. This got her thinking about her existence and how she has let relationships "define her". She has now been 2 years without a relationship. She says "I'm okay (mostly) with "just getting high and watching TV". She states "I don't know how to be a complete person". Part of her accepts this approach of avoidance and part of her feels like se is  not living. She isn't sure what a "full life" means for her. She says she is simply not doing anything other  than passing time. In the past she would participate in activities with her partner. She does say "I'm not happy". We discussed what would happiness look like for her. She feels happiness has eluded her "forever". Her happiness has always been "relationship driven". We will further discuss happiness in her life and what steps she needs to begin to take.                                                                                                                                                                               [name], PhD  Time: 4:10p-5:00p 50 minutes.

## 2022-07-08 ENCOUNTER — Telehealth (HOSPITAL_COMMUNITY): Payer: Self-pay | Admitting: Psychiatry

## 2022-07-09 ENCOUNTER — Ambulatory Visit (INDEPENDENT_AMBULATORY_CARE_PROVIDER_SITE_OTHER): Payer: Medicare Other | Admitting: Psychology

## 2022-07-09 DIAGNOSIS — F325 Major depressive disorder, single episode, in full remission: Secondary | ICD-10-CM

## 2022-07-09 NOTE — Progress Notes (Signed)
Sharon Cole is a 67 y.o. female patient   07/09/2022  Treatment Plan: Diagnosis 296.32 (Major depressive affective disorder, recurrent episode, moderate) [n/a]  300.02 (Generalized anxiety disorder) [n/a]  Symptoms Depressed or irritable mood. (Status: maintained) -- No Description Entered  Feelings of hopelessness, worthlessness, or inappropriate guilt. (Status: maintained) -- No Description Entered  Lack of energy. (Status: maintained) -- No Description Entered  Low self-esteem. (Status: maintained) -- No Description Entered  Medication Status compliance  Safety none  If Suicidal or Homicidal State Action Taken: unspecified  Current Risk: low Medications Abilify (Dosage: .'25mg'$ )  Buspar (Dosage: '30mg'$ )  Citalopram (Dosage: '20mg'$ )  Topomax (Dosage: unknown)  Objectives Related Problem: Recognize, accept, and cope with feelings of depression. Description: Identify and replace thoughts and beliefs that support depression. Target Date: 2022-10-13 Frequency: Daily Modality: individual Progress: 60%  Related Problem: Recognize, accept, and cope with feelings of depression. Description: Learn and implement behavioral strategies to overcome depression. Target Date: 2022-10-13 Frequency: Daily Modality: individual Progress: 60%  Related Problem: Recognize, accept, and cope with feelings of depression. Description: Verbalize an understanding and resolution of current interpersonal problems. Target Date: 2022-10-13 Frequency: Daily Modality: individual Progress: 80%  Related Problem: Recognize, accept, and cope with feelings of depression. Description: Verbalize insight into how past relationships may be influencing current experiences with depression. Target Date: 2022-10-13 Frequency: Daily Modality: individual Progress: 80%  Client Response full  compliance  Service Location Location, [name] [place], [place] [address]  Service Code cpt [phone]  Normalize/Reframe  Facilitate problem solving  Identify/label emotions  Validate/empathize  Emotion regulation skills  Self care activities  Lifestyle change (exercise, nutrition)  Self-monitoring  Identified an insight  Rationally challenge thoughts or beliefs/cognitive restructuring  Session notes:  F33.2  Goals: Wants to work on being genuine and being satisfied with herself and develop stronger self-esteem. Also, would like to improve her primary relationship and have her interpersonal and romantic needs met. This goal is now met, as she had ended the relationship. Wants to continue self-care and maintain weight loss. Needs to develop strategy to manage relationship with her son Sharon Cole, who struggles with mental health issues. Goal date 06-24. 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 Will also attempt to create a small, but satisfying social network with like-minded people. Goal date is 06-24.   Meds: Topamax, Citalopram ('20mg'$ ), Buspar, Abilify '5mg'$   Patient agrees to video Webex session. She is at home and I am at my home office.   Sharon Cole says she is feeling "blah". Is concerned that her eating is getting out of control because she has gained a few pounds. She feels she cannot do anything to help herself. The only thing she feels she can do is to get herself to work. She has been in this state for so long, she is not sure how to get out. She says part of her wants solitude, but the other part recognizes she is not taking care of her self and she is inhibited. She says her "whole existence" feels out of her reach. She has no idea what would work  to help her. She is not attending to many daily tasks, like cleaning her apartment. We discussed some of the "small" tasks that she needs to get done and told her that those need to be scheduled. With  regard to finding friends/companions, she feels it is too risky. She will make a list of tasks for today to complete and will also evaluate what she feels she needs and wants.                                                                                                                                                                              [name], PhD  Time: 1:10p-2:00p 50 minutes.

## 2022-07-16 ENCOUNTER — Ambulatory Visit (INDEPENDENT_AMBULATORY_CARE_PROVIDER_SITE_OTHER): Payer: Medicare Other | Admitting: Psychology

## 2022-07-16 DIAGNOSIS — F325 Major depressive disorder, single episode, in full remission: Secondary | ICD-10-CM

## 2022-07-17 NOTE — Progress Notes (Addendum)
Sharon Cole is a 67 y.o. female patient   07/16/2022  Treatment Plan: Diagnosis 296.32 (Major depressive affective disorder, recurrent episode, moderate) [n/a]  300.02 (Generalized anxiety disorder) [n/a]  Symptoms Depressed or irritable mood. (Status: maintained) -- No Description Entered  Feelings of hopelessness, worthlessness, or inappropriate guilt. (Status: maintained) -- No Description Entered  Lack of energy. (Status: maintained) -- No Description Entered  Low self-esteem. (Status: maintained) -- No Description Entered  Medication Status compliance  Safety none  If Suicidal or Homicidal State Action Taken: unspecified  Current Risk: low Medications Abilify (Dosage: .25mg )  Buspar (Dosage: 30mg )  Citalopram (Dosage: 20mg )  Topomax (Dosage: unknown)  Objectives Related Problem: Recognize, accept, and cope with feelings of depression. Description: Identify and replace thoughts and beliefs that support depression. Target Date: 2022-10-13 Frequency: Daily Modality: individual Progress: 60%  Related Problem: Recognize, accept, and cope with feelings of depression. Description: Learn and implement behavioral strategies to overcome depression. Target Date: 2022-10-13 Frequency: Daily Modality: individual Progress: 60%  Related Problem: Recognize, accept, and cope with feelings of depression. Description: Verbalize an understanding and resolution of current interpersonal problems. Target Date: 2022-10-13 Frequency: Daily Modality: individual Progress: 80%  Related Problem: Recognize, accept, and cope with feelings of depression. Description: Verbalize insight into how past relationships may be influencing current experiences with depression. Target Date: 2022-10-13 Frequency: Daily Modality: individual Progress:  80%  Client Response full compliance  Service Location Location, [name] [place],  [address]  Service Code cpt [phone]  Normalize/Reframe  Facilitate problem solving  Identify/label emotions  Validate/empathize  Emotion regulation skills  Self care activities  Lifestyle change (exercise, nutrition)  Self-monitoring  Identified an insight  Rationally challenge thoughts or beliefs/cognitive restructuring  Session notes:  F33.2  Goals: Wants to work on being genuine and being satisfied with herself and develop stronger self-esteem. Also, would like to improve her primary relationship and have her interpersonal and romantic needs met. This goal is now met, as she had ended the relationship. Wants to continue self-care and maintain weight loss. Needs to develop strategy to manage relationship with her son Sharon Cole, who struggles with mental health issues. Goal date 06-24. [name] is now wanting to create a new, fulfilling life and pursue her interest in art. Will also attempt to create a small, but satisfying social network with like-minded people. Goal date is 06-24.   Meds: Topamax, Citalopram (20mg ), Buspar, Abilify 5mg   Patient agrees to video Webex session. She is at home and I am at my home office.   Sharon Cole was very pleased to report that she devoted an hour today on working on her unfinished art project (bird cage). It has been many months and she has not been a ble to motivate herself to get back to the project, in spite of many attempts to establish a plan. She says it was very gratifying and she plans to continue until the project is completed. We talked about potential barriers and the need to be both structured and realistic. The hope is that this will result in  her return to this work, which provides her great satisfaction and pleasure which has been absent for a very long time. Sharon Cole also talked about her weight struggles. She resists starting a "formal" diet and prefers to be  more mindful of her eating. She has an awareness of her dietary habits, but cannot seem to alter her behavior in a way that is necessary to lose weight. We will continue to address this issue and help her develop a strategy to be more successful.                                                                                                                                                                                 Sharon Morel, PhD  Time: 4:10p-5:00p 50 minutes.

## 2022-07-23 ENCOUNTER — Ambulatory Visit (INDEPENDENT_AMBULATORY_CARE_PROVIDER_SITE_OTHER): Payer: Medicare Other | Admitting: Psychology

## 2022-07-23 DIAGNOSIS — F325 Major depressive disorder, single episode, in full remission: Secondary | ICD-10-CM

## 2022-07-23 NOTE — Progress Notes (Signed)
Sharon Cole is a 67 y.o. female patient   07/23/2022  Treatment Plan: Diagnosis 296.32 (Major depressive affective disorder, recurrent episode, moderate) [n/a]  300.02 (Generalized anxiety disorder) [n/a]  Symptoms Depressed or irritable mood. (Status: maintained) -- No Description Entered  Feelings of hopelessness, worthlessness, or inappropriate guilt. (Status: maintained) -- No Description Entered  Lack of energy. (Status: maintained) -- No Description Entered  Low self-esteem. (Status: maintained) -- No Description Entered  Medication Status compliance  Safety none  If Suicidal or Homicidal State Action Taken: unspecified  Current Risk: low Medications Abilify (Dosage: .25mg )  Buspar (Dosage: 30mg )  Citalopram (Dosage: 20mg )  Topomax (Dosage: unknown)  Objectives Related Problem: Recognize, accept, and cope with feelings of depression. Description: Identify and replace thoughts and beliefs that support depression. Target Date: 2022-10-13 Frequency: Daily Modality: individual Progress: 60%  Related Problem: Recognize, accept, and cope with feelings of depression. Description: Learn and implement behavioral strategies to overcome depression. Target Date: 2022-10-13 Frequency: Daily Modality: individual Progress: 60%  Related Problem: Recognize, accept, and cope with feelings of depression. Description: Verbalize an understanding and resolution of current interpersonal problems. Target Date: 2022-10-13 Frequency: Daily Modality: individual Progress: 80%  Related Problem: Recognize, accept, and cope with feelings of depression. Description: Verbalize insight into how past relationships may be influencing current experiences with depression. Target Date: 2022-10-13 Frequency:  Daily Modality: individual Progress: 80%  Client Response full compliance  Service Location Location, [name] [place], [place] [address]  Service Code cpt [phone]  Normalize/Reframe  Facilitate problem solving  Identify/label emotions  Validate/empathize  Emotion regulation skills  Self care activities  Lifestyle change (exercise, nutrition)  Self-monitoring  Identified an insight  Rationally challenge thoughts or beliefs/cognitive restructuring  Session notes:  F33.2  Goals: Wants to work on being genuine and being satisfied with herself and develop stronger self-esteem. Also, would like to improve her primary relationship and have her interpersonal and romantic needs met. This goal is now met, as she had ended the relationship. Wants to continue self-care and maintain weight loss. Needs to develop strategy to manage relationship with her son [name], who struggles with mental health issues. Goal date 06-24. 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 Will also attempt to create a small, but satisfying social network with like-minded people. Goal date is 06-24.   Meds: Topamax, Citalopram (20mg ), Buspar, Abilify 5mg   Patient agrees to video Webex session. She is at home and I am at my home office.   Sharon Cole says she has been working for 6 straight days. She is now off for 2 days in a row. She will plan to do some work on her art sometime while off. She is frustrated with her dentures. While there is no pain, they are very uncomfortable because they move a lot. She is most frustrated with her inability to manage her diet and weight. She claims "I don't do anything good for  myself". No exercising, poor diet and poor self-care. Talked about the need to structure her days AND activities. She has not made any efforts to create change or structure in her life.                                                                                                                                                                                      [name], PhD  Time: 1:10p-2:00p 50 minutes.

## 2022-07-30 ENCOUNTER — Ambulatory Visit (INDEPENDENT_AMBULATORY_CARE_PROVIDER_SITE_OTHER): Payer: Medicare Other | Admitting: Psychology

## 2022-07-30 DIAGNOSIS — F325 Major depressive disorder, single episode, in full remission: Secondary | ICD-10-CM

## 2022-07-30 NOTE — Progress Notes (Signed)
Sharon Cole is a 67 y.o. female patient   07/30/2022  Treatment Plan: Diagnosis 296.32 (Major depressive affective disorder, recurrent episode, moderate) [n/a]  300.02 (Generalized anxiety disorder) [n/a]  Symptoms Depressed or irritable mood. (Status: maintained) -- No Description Entered  Feelings of hopelessness, worthlessness, or inappropriate guilt. (Status: maintained) -- No Description Entered  Lack of energy. (Status: maintained) -- No Description Entered  Low self-esteem. (Status: maintained) -- No Description Entered  Medication Status compliance  Safety none  If Suicidal or Homicidal State Action Taken: unspecified  Current Risk: low Medications Abilify (Dosage: .25mg )  Buspar (Dosage: 30mg )  Citalopram (Dosage: 20mg )  Topomax (Dosage: unknown)  Objectives Related Problem: Recognize, accept, and cope with feelings of depression. Description: Identify and replace thoughts and beliefs that support depression. Target Date: 2022-10-13 Frequency: Daily Modality: individual Progress: 60%  Related Problem: Recognize, accept, and cope with feelings of depression. Description: Learn and implement behavioral strategies to overcome depression. Target Date: 2022-10-13 Frequency: Daily Modality: individual Progress: 60%  Related Problem: Recognize, accept, and cope with feelings of depression. Description: Verbalize an understanding and resolution of current interpersonal problems. Target Date: 2022-10-13 Frequency: Daily Modality: individual Progress: 80%  Related Problem: Recognize, accept, and cope with feelings of depression. Description: Verbalize insight into how past relationships may be influencing current experiences with depression. Target Date: 2022-10-13 Frequency: Daily Modality: individual Progress: 80%  Client Response full compliance  Service Location Location, [name] [place], [place] [address]  Service Code cpt [phone]   Normalize/Reframe  Facilitate problem solving  Identify/label emotions  Validate/empathize  Emotion regulation skills  Self care activities  Lifestyle change (exercise, nutrition)  Self-monitoring  Identified an insight  Rationally challenge thoughts or beliefs/cognitive restructuring  Session notes:  F33.2  Goals: Wants to work on being genuine and being satisfied with herself and develop stronger self-esteem. Also, would like to improve her primary relationship and have her interpersonal and romantic needs met. This goal is now met, as she had ended the relationship. Wants to continue self-care and maintain weight loss. Needs to develop strategy to manage relationship with her son Sharon Cole, who struggles with mental health issues. Goal date 06-24. [name] is now wanting to create a new, fulfilling life and pursue her interest in art. Will also attempt to create a small, but satisfying social network with like-minded people. Goal date is 06-24.   Meds: Topamax, Citalopram (20mg ), Buspar, Abilify 5mg   Patient agrees to video Webex session. She is at home and I am at my home office.   Sharon Cole said she is feeling "okay" and saw her grandchildren. She is going to her brother's house for Easter and is not looking forward to it. She is inhibited about her weight and also does not like socializing with her brother or his family. She said she did not do any of her art work on her days off this week. She has a plan to work on her art on her next day off. She is also starting back to the [place]. She is 5 days in and has lost 2 pounds. I inquired about any desire she may have on the social side. The only social area she has any interest in is to have a female companion. Though inhibited due to her weight, she is willing to have her son take photos and post on a dating site.  [name], PhD  Time: 4:10p-5:00p 50 minutes.

## 2022-08-06 ENCOUNTER — Ambulatory Visit (INDEPENDENT_AMBULATORY_CARE_PROVIDER_SITE_OTHER): Payer: Medicare Other | Admitting: Psychology

## 2022-08-06 DIAGNOSIS — F325 Major depressive disorder, single episode, in full remission: Secondary | ICD-10-CM

## 2022-08-06 NOTE — Progress Notes (Signed)
Sharon Cole is a 67 y.o. female patient   08/06/2022  Treatment Plan: Diagnosis 296.32 (Major depressive affective disorder, recurrent episode, moderate) [n/a]  300.02 (Generalized anxiety disorder) [n/a]  Symptoms Depressed or irritable mood. (Status: maintained) -- No Description Entered  Feelings of hopelessness, worthlessness, or inappropriate guilt. (Status: maintained) -- No Description Entered  Lack of energy. (Status: maintained) -- No Description Entered  Low self-esteem. (Status: maintained) -- No Description Entered  Medication Status compliance  Safety none  If Suicidal or Homicidal State Action Taken: unspecified  Current Risk: low Medications Abilify (Dosage: .25mg )  Buspar (Dosage: 30mg )  Citalopram (Dosage: 20mg )  Topomax (Dosage: unknown)  Objectives Related Problem: Recognize, accept, and cope with feelings of depression. Description: Identify and replace thoughts and beliefs that support depression. Target Date: 2022-10-13 Frequency: Daily Modality: individual Progress: 60%  Related Problem: Recognize, accept, and cope with feelings of depression. Description: Learn and implement behavioral strategies to overcome depression. Target Date: 2022-10-13 Frequency: Daily Modality: individual Progress: 60%  Related Problem: Recognize, accept, and cope with feelings of depression. Description: Verbalize an understanding and resolution of current interpersonal problems. Target Date: 2022-10-13 Frequency: Daily Modality: individual Progress: 80%  Related Problem: Recognize, accept, and cope with feelings of depression. Description: Verbalize insight into how past relationships may be influencing current experiences with depression. Target Date: 2022-10-13 Frequency: Daily Modality: individual Progress: 80%  Client Response full compliance  Service Location Location, [address] [place],  [address]   Service Code cpt [phone]  Normalize/Reframe  Facilitate problem solving  Identify/label emotions  Validate/empathize  Emotion regulation skills  Self care activities  Lifestyle change (exercise, nutrition)  Self-monitoring  Identified an insight  Rationally challenge thoughts or beliefs/cognitive restructuring  Session notes:  F33.2  Goals: Wants to work on being genuine and being satisfied with herself and develop stronger self-esteem. Also, would like to improve her primary relationship and have her interpersonal and romantic needs met. This goal is now met, as she had ended the relationship. Wants to continue self-care and maintain weight loss. Needs to develop strategy to manage relationship with her son Sharon Cole, who struggles with mental health issues. Goal date 06-24. 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 Will also attempt to create a small, but satisfying social network with like-minded people. Goal date is 06-24.   Meds: Topamax, Citalopram (20mg ), Buspar, Abilify 5mg   Patient agrees to video Webex session. She is at home and I am at my home office.   Sharon Cole says that [name] switched the Easter gathering from his house to his mother in [place]. This was very difficult for her, but Sharon Cole decided to go. In the past she would have cancelled, but she wanted to see her neices/nephews. It was "very uncomfortable even though she knows these people for many years. She managed to stay for the entire dinner and is glad she went. It was awkward but she knows it was the right thing and was appreciated. Sharon Cole had a situation at work and for the first time, her boss yelled at her. She was told that she too frequently says "I know" and he finds it irritating. He yelled at her in front of others and she felt humiliated. She has not followed up with him. Afterwards, he approached Sharon Cole and said "I was just trying to help you" and explained how  he feels when she says "I  know". We talked about what she can say to her boss to help him understand how she felt. She finds him a little intimidating and not sure she can approach. She is more inclined to approach a supervisor to get advice on approaching the "big boss".                                                                                                                                                                                            Sharon Morel, PhD  Time: 1:10p-2:00p 50 minutes.

## 2022-08-13 ENCOUNTER — Ambulatory Visit (INDEPENDENT_AMBULATORY_CARE_PROVIDER_SITE_OTHER): Payer: Medicare Other | Admitting: Psychology

## 2022-08-13 DIAGNOSIS — F325 Major depressive disorder, single episode, in full remission: Secondary | ICD-10-CM

## 2022-08-13 NOTE — Progress Notes (Signed)
MAREDITH SLAVICK is a 67 y.o. female patient   08/13/2022  Treatment Plan: Diagnosis 296.32 (Major depressive affective disorder, recurrent episode, moderate) [n/a]  300.02 (Generalized anxiety disorder) [n/a]  Symptoms Depressed or irritable mood. (Status: maintained) -- No Description Entered  Feelings of hopelessness, worthlessness, or inappropriate guilt. (Status: maintained) -- No Description Entered  Lack of energy. (Status: maintained) -- No Description Entered  Low self-esteem. (Status: maintained) -- No Description Entered  Medication Status compliance  Safety none  If Suicidal or Homicidal State Action Taken: unspecified  Current Risk: low Medications Abilify (Dosage: .25mg )  Buspar (Dosage: 30mg )  Citalopram (Dosage: 20mg )  Topomax (Dosage: unknown)  Objectives Related Problem: Recognize, accept, and cope with feelings of depression. Description: Identify and replace thoughts and beliefs that support depression. Target Date: 2022-10-13 Frequency: Daily Modality: individual Progress: 60%  Related Problem: Recognize, accept, and cope with feelings of depression. Description: Learn and implement behavioral strategies to overcome depression. Target Date: 2022-10-13 Frequency: Daily Modality: individual Progress: 60%  Related Problem: Recognize, accept, and cope with feelings of depression. Description: Verbalize an understanding and resolution of current interpersonal problems. Target Date: 2022-10-13 Frequency: Daily Modality: individual Progress: 80%  Related Problem: Recognize, accept, and cope with feelings of depression. Description: Verbalize insight into how past relationships may be influencing current experiences with depression. Target Date: 2022-10-13 Frequency: Daily Modality: individual Progress: 80%  Client Response full compliance  Service Location Location, [address]  [place], [place] [address]  Service Code cpt [phone]  Normalize/Reframe  Facilitate problem solving  Identify/label emotions  Validate/empathize  Emotion regulation skills  Self care activities  Lifestyle change (exercise, nutrition)  Self-monitoring  Identified an insight  Rationally challenge thoughts or beliefs/cognitive restructuring  Session notes:  F33.2  Goals: Wants to work on being genuine and being satisfied with herself and develop stronger self-esteem. Also, would like to improve her primary relationship and have her interpersonal and romantic needs met. This goal is now met, as she had ended the relationship. Wants to continue self-care and maintain weight loss. Needs to develop strategy to manage relationship with her son [name], who struggles with mental health issues. Goal date 06-24. 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 Will also attempt to create a small, but satisfying social network with like-minded people. Goal date is 06-24.   Meds: Topamax, Citalopram (20mg ), Buspar, Abilify 5mg   Patient agrees to video Webex session. She is at home and I am at my home office.   [name] that work has been very busy. She chose not to say anything to her boss about how he treated her. She felt better after our last session and decided she did not need to talk with him. She is clear that if he ever treats her like that again, she will make a point of talking to him privately. Her daughter had another "episode" and went to the RR. This is the second time in several weeks. This is related to some cardiac issues that elevates her heart rate and results in shortness of breath. She gets cleared every time she goes to the ER and the cardiologists are not clear how to treat her. She also has POTS. 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 She will talk to her son if it is  a realistic option. She has not talked with [name] in 2-3 weeks. He has not been talking to anyone in the family. Feels powerless to do anything with him.                                                                                                                                                                                                   [name], PhD  Time: 5:10p-6:00p 50 minutes.

## 2022-08-20 ENCOUNTER — Ambulatory Visit (INDEPENDENT_AMBULATORY_CARE_PROVIDER_SITE_OTHER): Payer: Medicare Other | Admitting: Psychology

## 2022-08-20 DIAGNOSIS — F325 Major depressive disorder, single episode, in full remission: Secondary | ICD-10-CM

## 2022-08-20 NOTE — Progress Notes (Signed)
Sharon Cole is a 67 y.o. female patient   08/20/2022  Treatment Plan: Diagnosis 296.32 (Major depressive affective disorder, recurrent episode, moderate) [n/a]  300.02 (Generalized anxiety disorder) [n/a]  Symptoms Depressed or irritable mood. (Status: maintained) -- No Description Entered  Feelings of hopelessness, worthlessness, or inappropriate guilt. (Status: maintained) -- No Description Entered  Lack of energy. (Status: maintained) -- No Description Entered  Low self-esteem. (Status: maintained) -- No Description Entered  Medication Status compliance  Safety none  If Suicidal or Homicidal State Action Taken: unspecified  Current Risk: low Medications Abilify (Dosage: . )  Buspar (Dosage: )  Citalopram (Dosage: )  Topomax (Dosage: unknown)  Objectives Related Problem: Recognize, accept, and cope with feelings of depression. Description: Identify and replace thoughts and beliefs that support depression. Target Date: 2022-10-13 Frequency: Daily Modality: individual Progress: 60%  Related Problem: Recognize, accept, and cope with feelings of depression. Description: Learn and implement behavioral strategies to overcome depression. Target Date: 2022-10-13 Frequency: Daily Modality: individual Progress: 60%  Related Problem: Recognize, accept, and cope with feelings of depression. Description: Verbalize an understanding and resolution of current interpersonal problems. Target Date: 2022-10-13 Frequency: Daily Modality: individual Progress: 80%  Related Problem: Recognize, accept, and cope with feelings of depression. Description: Verbalize insight into how past relationships may be influencing current experiences with depression. Target Date: 2022-10-13 Frequency: Daily Modality: individual Progress: 80%  Client Response full compliance  Service Location Location, [address] [place], [place] [address]  Service Code cpt [phone]   Normalize/Reframe  Facilitate problem solving  Identify/label emotions  Validate/empathize  Emotion regulation skills  Self care activities  Lifestyle change (exercise, nutrition)  Self-monitoring  Identified an insight  Rationally challenge thoughts or beliefs/cognitive restructuring  Session notes:  F33.2  Goals: Wants to work on being genuine and being satisfied with herself and develop stronger self-esteem. Also, would like to improve her primary relationship and have her interpersonal and romantic needs met. This goal is now met, as she had ended the relationship. Wants to continue self-care and maintain weight loss. Needs to develop strategy to manage relationship with her son Sharon Hua, who struggles with mental health issues. Goal date 06-24. 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 Will also attempt to create a small, but satisfying social network with like-minded people. Goal date is 06-24.   Meds: Topamax, Citalopram ( ), Buspar, Abilify   Patient agrees to video Webex session. She is at home and I am at my home office.   Sharon Cole says that she has felt more inhibited at work since the "incident" with her boss. She is just trying to "stay out of the way" and she is "not as happy" as she has been at work. It is disappointing for her and tends to increase her overall anxiety at work. We talked about utilizing some cognitive strategies to minimize fears.  Sharon Cole will likely go to [facility] for [place]. 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 He told her that the training is imperative. She will consider taking a class at [employer]. Sharon Cole needs assistance in structuring her time when not working in order to be efficient in accomplishing tasks. She will work on trying this with her upcoming time off.  [name], PhD  Time: 5:10p-6:00p 50 minutes.

## 2022-08-27 ENCOUNTER — Ambulatory Visit (INDEPENDENT_AMBULATORY_CARE_PROVIDER_SITE_OTHER): Payer: Medicare Other | Admitting: Psychology

## 2022-08-27 DIAGNOSIS — F325 Major depressive disorder, single episode, in full remission: Secondary | ICD-10-CM | POA: Diagnosis not present

## 2022-08-27 NOTE — Progress Notes (Signed)
Sharon Cole is a 67 y.o. female patient   08/27/2022  Treatment Plan: Diagnosis 296.32 (Major depressive affective disorder, recurrent episode, moderate) [n/a]  300.02 (Generalized anxiety disorder) [n/a]  Symptoms Depressed or irritable mood. (Status: maintained) -- No Description Entered  Feelings of hopelessness, worthlessness, or inappropriate guilt. (Status: maintained) -- No Description Entered  Lack of energy. (Status: maintained) -- No Description Entered  Low self-esteem. (Status: maintained) -- No Description Entered  Medication Status compliance  Safety none  If Suicidal or Homicidal State Action Taken: unspecified  Current Risk: low Medications Abilify (Dosage: . )  Buspar (Dosage: )  Citalopram (Dosage: )  Topomax (Dosage: unknown)  Objectives Related Problem: Recognize, accept, and cope with feelings of depression. Description: Identify and replace thoughts and beliefs that support depression. Target Date: 2022-10-13 Frequency: Daily Modality: individual Progress: 60%  Related Problem: Recognize, accept, and cope with feelings of depression. Description: Learn and implement behavioral strategies to overcome depression. Target Date: 2022-10-13 Frequency: Daily Modality: individual Progress: 60%  Related Problem: Recognize, accept, and cope with feelings of depression. Description: Verbalize an understanding and resolution of current interpersonal problems. Target Date: 2022-10-13 Frequency: Daily Modality: individual Progress: 80%  Related Problem: Recognize, accept, and cope with feelings of depression. Description: Verbalize insight into how past relationships may be influencing current experiences with depression. Target Date: 2022-10-13 Frequency: Daily Modality: individual Progress: 80%  Client Response full compliance  Service Location Location, [address] [place], [place] [address]   Service Code cpt [phone]  Normalize/Reframe  Facilitate problem solving  Identify/label emotions  Validate/empathize  Emotion regulation skills  Self care activities  Lifestyle change (exercise, nutrition)  Self-monitoring  Identified an insight  Rationally challenge thoughts or beliefs/cognitive restructuring  Session notes:  F33.2  Goals: Wants to work on being genuine and being satisfied with herself and develop stronger self-esteem. Also, would like to improve her primary relationship and have her interpersonal and romantic needs met. This goal is now met, as she had ended the relationship. Wants to continue self-care and maintain weight loss. Needs to develop strategy to manage relationship with her son Sharon Cole, who struggles with mental health issues. Goal date 06-24. 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 Will also attempt to create a small, but satisfying social network with like-minded people. Goal date is 06-24.   Meds: Topamax, Citalopram ( ), Buspar, Abilify   Patient agrees to video Webex session. She is at home and I am at my home office.   Sharon Cole says that she thinks she may be done with this job. She says she is "a nervous wreck" all the time at work. In the past, she took half a gummie in the morning and half in the afternoon. It worked to keep her calmer, but she stopped because she wanted to reduce it. Suggested she restart the CBD. She will try, but says she thinks that she will still be dissatisfied. I suggested that it was the incident with her boss that set the stage for her extreme anxiety. She agrees and still feels the need to seek other employment. She says that she can tolerate staying until she finds another position. Told her we still need to explore and understand her trigger. Has been doing some of her art work and gets sad because it reminds her of her lack of peace and her need  to feel a sense of peace. She misses the  lifestyle she had in her relationship (but not the relationship itself).                                                                                                                                                                                                              [name], PhD  Time: 4:10p-5:00p 50 minutes.

## 2022-09-03 ENCOUNTER — Ambulatory Visit (INDEPENDENT_AMBULATORY_CARE_PROVIDER_SITE_OTHER): Payer: Medicare Other | Admitting: Psychology

## 2022-09-03 DIAGNOSIS — F325 Major depressive disorder, single episode, in full remission: Secondary | ICD-10-CM | POA: Diagnosis not present

## 2022-09-03 NOTE — Progress Notes (Signed)
Sharon Cole is a 67 y.o. female patient   09/03/2022  Treatment Plan: Diagnosis 296.32 (Major depressive affective disorder, recurrent episode, moderate) [n/a]  300.02 (Generalized anxiety disorder) [n/a]  Symptoms Depressed or irritable mood. (Status: maintained) -- No Description Entered  Feelings of hopelessness, worthlessness, or inappropriate guilt. (Status: maintained) -- No Description Entered  Lack of energy. (Status: maintained) -- No Description Entered  Low self-esteem. (Status: maintained) -- No Description Entered  Medication Status compliance  Safety none  If Suicidal or Homicidal State Action Taken: unspecified  Current Risk: low Medications Abilify (Dosage: .25mg )  Buspar (Dosage: 30mg )  Citalopram (Dosage: 20mg )  Topomax (Dosage: unknown)  Objectives Related Problem: Recognize, accept, and cope with feelings of depression. Description: Identify and replace thoughts and beliefs that support depression. Target Date: 2022-10-13 Frequency: Daily Modality: individual Progress: 60%  Related Problem: Recognize, accept, and cope with feelings of depression. Description: Learn and implement behavioral strategies to overcome depression. Target Date: 2022-10-13 Frequency: Daily Modality: individual Progress: 60%  Related Problem: Recognize, accept, and cope with feelings of depression. Description: Verbalize an understanding and resolution of current interpersonal problems. Target Date: 2022-10-13 Frequency: Daily Modality: individual Progress: 80%  Related Problem: Recognize, accept, and cope with feelings of depression. Description: Verbalize insight into how past relationships may be influencing current experiences with depression. Target Date: 2022-10-13 Frequency: Daily Modality: individual Progress: 80%  Client Response full compliance  Service Location Location, [address] [place], [place] [address]  Service Code cpt [id number]  Normalize/Reframe  Facilitate problem solving  Identify/label emotions  Validate/empathize  Emotion regulation skills  Self care activities  Lifestyle change (exercise, nutrition)  Self-monitoring  Identified an insight  Rationally challenge thoughts or beliefs/cognitive restructuring  Session notes:  F33.2  Goals: Wants to work on being genuine and being satisfied with herself and develop stronger self-esteem. Also, would like to improve her primary relationship and have her interpersonal and romantic needs met. This goal is now met, as she had ended the relationship. Wants to continue self-care and maintain weight loss. Needs to develop strategy to manage relationship with her son Sharon Cole, who struggles with mental health issues. Goal date 06-24. 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 Will also attempt to create a small, but satisfying social network with like-minded people. Goal date is 06-24.   Meds: Topamax, Citalopram (20mg ), Buspar, Abilify 5mg   Patient agrees to video Webex session. She is at home and I am at my home office.   Sharon Cole says that working with her boss last week turned out to be fine with no difficult interactions. This has reduced her overall anxiety out of work. She has not, however, reduced the use of her gummies. She agrees she needs to cut back and feels she would be satisfied with 1 gummie and 1 joint in an evening. This is the [place] legal product that is not as potent as what she had in the past. She had lunch with Sharon Cole and Sharon Cole (with family) yesterday following his birthday last Friday. It was hard for Sharon Cole because Sharon Cole is in such a bad place. She did say he looks good. Under pressure because his lease is running out. Sharon Cole is not overly anxious about that situation and figures Sharon Cole will take care  of it. Sharon Cole had a situation at work with a guy who sold a car and then wanted her  number. Told her he would call and never did. He came back to the dealership for a car reason, saw her and told her again he would call and complimented her. This was very disappointing for her. Is not sure why he would compliment her and then "blow her off". We talked about next step and she is inclined to call him to have a casual get together.                                                                                                                                                                                                                 [name], PhD  Time: 1:10p-2:00p 50 minutes.

## 2022-09-04 ENCOUNTER — Telehealth (HOSPITAL_COMMUNITY): Payer: Medicare Other | Admitting: Student in an Organized Health Care Education/Training Program

## 2022-09-10 ENCOUNTER — Ambulatory Visit: Payer: Medicare Other | Admitting: Psychology

## 2022-09-12 ENCOUNTER — Telehealth (HOSPITAL_COMMUNITY): Payer: Medicare Other | Admitting: Student in an Organized Health Care Education/Training Program

## 2022-09-17 ENCOUNTER — Ambulatory Visit (INDEPENDENT_AMBULATORY_CARE_PROVIDER_SITE_OTHER): Payer: Medicare Other | Admitting: Psychology

## 2022-09-17 DIAGNOSIS — F325 Major depressive disorder, single episode, in full remission: Secondary | ICD-10-CM | POA: Diagnosis not present

## 2022-09-17 NOTE — Progress Notes (Signed)
Sharon Cole is a 67 y.o. female patient   09/17/2022  Treatment Plan: Diagnosis 296.32 (Major depressive affective disorder, recurrent episode, moderate) [n/a]  300.02 (Generalized anxiety disorder) [n/a]  Symptoms Depressed or irritable mood. (Status: maintained) -- No Description Entered  Feelings of hopelessness, worthlessness, or inappropriate guilt. (Status: maintained) -- No Description Entered  Lack of energy. (Status: maintained) -- No Description Entered  Low self-esteem. (Status: maintained) -- No Description Entered  Medication Status compliance  Safety none  If Suicidal or Homicidal State Action Taken: unspecified  Current Risk: low Medications Abilify (Dosage: .25mg )  Buspar (Dosage: 30mg )  Citalopram (Dosage: 20mg )  Topomax (Dosage: unknown)  Objectives Related Problem: Recognize, accept, and cope with feelings of depression. Description: Identify and replace thoughts and beliefs that support depression. Target Date: 2022-10-13 Frequency: Daily Modality: individual Progress: 60%  Related Problem: Recognize, accept, and cope with feelings of depression. Description: Learn and implement behavioral strategies to overcome depression. Target Date: 2022-10-13 Frequency: Daily Modality: individual Progress: 60%  Related Problem: Recognize, accept, and cope with feelings of depression. Description: Verbalize an understanding and resolution of current interpersonal problems. Target Date: 2022-10-13 Frequency: Daily Modality: individual Progress: 80%  Related Problem: Recognize, accept, and cope with feelings of depression. Description: Verbalize insight into how past relationships may be influencing current experiences with depression. Target Date: 2022-10-13 Frequency: Daily Modality: individual Progress: 80%  Client Response full compliance  Service  Location Location, [address] [place], [place] [address]  Service Code cpt [phone]  Normalize/Reframe  Facilitate problem solving  Identify/label emotions  Validate/empathize  Emotion regulation skills  Self care activities  Lifestyle change (exercise, nutrition)  Self-monitoring  Identified an insight  Rationally challenge thoughts or beliefs/cognitive restructuring  Session notes:  F33.2  Goals: Wants to work on being genuine and being satisfied with herself and develop stronger self-esteem. Also, would like to improve her primary relationship and have her interpersonal and romantic needs met. This goal is now met, as she had ended the relationship. Wants to continue self-care and maintain weight loss. Needs to develop strategy to manage relationship with her son Sharon Cole, who struggles with mental health issues. Goal date 06-24. 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 Will also attempt to create a small, but satisfying social network with like-minded people. Goal date is 06-24.   Meds: Topamax, Citalopram (20mg ), Buspar, Abilify 5mg   Patient agrees to video Webex session. She is at home and I am at my home office.   Sharon Cole had to have some unanticipated dental work which made her highly anxious. She managed through and was able to get it all done.  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 She is planning to go to [place] with her son and his family in June. Looking forward to the trip. She got together with both boys on Mother's Day. 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 She has a new psychiatrist and will meet with him tomorrow. He is with [facility]. She is working on self-care. She has lost 10 lbs., after a long period of trying.  Sharon Ridgel, PhD  Time: 1:10p-2:00p 50 minutes.

## 2022-09-18 ENCOUNTER — Ambulatory Visit (HOSPITAL_BASED_OUTPATIENT_CLINIC_OR_DEPARTMENT_OTHER): Payer: Medicare Other | Admitting: Student in an Organized Health Care Education/Training Program

## 2022-09-18 ENCOUNTER — Encounter (HOSPITAL_COMMUNITY): Payer: Self-pay | Admitting: Student in an Organized Health Care Education/Training Program

## 2022-09-18 VITALS — BP 126/85 | HR 67 | Ht 65.0 in | Wt 212.4 lb

## 2022-09-18 DIAGNOSIS — F331 Major depressive disorder, recurrent, moderate: Secondary | ICD-10-CM

## 2022-09-18 DIAGNOSIS — F411 Generalized anxiety disorder: Secondary | ICD-10-CM

## 2022-09-18 MED ORDER — ESCITALOPRAM OXALATE 20 MG PO TABS
20.0000 mg | ORAL_TABLET | Freq: Every day | ORAL | 1 refills | Status: DC
Start: 2022-09-18 — End: 2022-11-19

## 2022-09-18 MED ORDER — ESCITALOPRAM OXALATE 10 MG PO TABS
30.0000 mg | ORAL_TABLET | Freq: Every day | ORAL | 1 refills | Status: DC
Start: 2022-09-18 — End: 2022-09-18

## 2022-09-18 MED ORDER — BUSPIRONE HCL 30 MG PO TABS: 30.0000 mg | ORAL_TABLET | Freq: Two times a day (BID) | ORAL | 1 refills | Status: DC

## 2022-09-18 MED ORDER — ARIPIPRAZOLE 5 MG PO TABS
5.0000 mg | ORAL_TABLET | Freq: Every day | ORAL | 1 refills | Status: DC
Start: 2022-09-18 — End: 2022-11-19

## 2022-09-18 MED ORDER — MIRTAZAPINE 7.5 MG PO TABS
7.5000 mg | ORAL_TABLET | Freq: Every day | ORAL | 1 refills | Status: DC
Start: 2022-09-18 — End: 2022-11-19

## 2022-09-18 NOTE — Progress Notes (Addendum)
BH MD/PA/NP OP Progress Note  09/18/2022 8:37 AM AZAILA Cole  MRN:  [id number]  Chief Complaint:  Chief Complaint  Patient presents with   Follow-up   Anxiety   Depression   HPI:  Sharon Cole is a 67 yr old female who presents for Follow Up and Medication Management.  PPHx is significant for Depression and Anxiety, and no Psychiatric Hospitalizations.  She reports that she has been doing okay since her last appointment.  She does report a significant increase in her anxiety due to work.  She reports currently works for [employer] and she has a lot of metrics that she has to meet.  She reports that she has made mistakes and she does report her boss yelling at her over the use.  She reports that in the past she was on 30 mg of Lexapro without side effect and thinks this would help with her increased anxiety at this time.  Discussed risk of increased risk of side effects with supratherapeutic doses.  Discussed she would need to get updated lab work at her PCP-EKG, CMP, CBC, lipid panel, and A1c.  She reported understanding and was agreeable with this and that she will get this done.  She reports no SI, HI, or AVH.  She reports her sleep is good.  She reports her appetite is good.  She reports no other concerns at present.  She will return for follow-up approximately 6 weeks.  Discussed with patient that Resident Provider would be transitioning their care to another Resident Provider, Dr. Jerrel Ivory, starting July 2024.  She reported understanding and had no concerns.   Update: After discussion with Dr. Mercy Riding called patient back.  Discussed with her that increasing the Lexapro to 30 mg given her age had significant potential side effects and that 20 mg of Lexapro is already supratherapeutic.  Discussed with her that instead of increasing the Lexapro we will trial in addition of Remeron.  Discussed and risks and side effects of Remeron and she was agreeable to a trial.  Discussed with her  the importance of still getting updated lab work done at her PCP as 20 mg of Lexapro is supratherapeutic still.  She reported understanding and was agreeable with this.  She reports no concerns present.    Visit Diagnosis:    ICD-10-CM   1. GAD (generalized anxiety disorder)  F41.1 busPIRone (BUSPAR) 30 MG tablet    ARIPiprazole (ABILIFY) 5 MG tablet    escitalopram (LEXAPRO) 10 MG tablet    2. Moderate episode of recurrent major depressive disorder (HCC)  F33.1 busPIRone (BUSPAR) 30 MG tablet    ARIPiprazole (ABILIFY) 5 MG tablet    escitalopram (LEXAPRO) 10 MG tablet      Past Psychiatric History: Depression and Anxiety, and no Psychiatric Hospitalizations.  Past Medical History:  Past Medical History:  Diagnosis Date   Anxiety    Bradycardia    Common migraine with intractable migraine 07/03/2016   Depression    Dizziness    Headache    Menopause    Syncope     Past Surgical History:  Procedure Laterality Date   BACK SURGERY     cyst removal    BREAST SURGERY     breast reduction   BUNIONECTOMY     CHOLECYSTECTOMY     KNEE ARTHROSCOPY      Family Psychiatric History: Reports None  Family History:  Family History  Problem Relation Age of Onset   Heart disease Mother  Heart disease Brother    Heart disease Maternal Grandmother    Heart disease Maternal Grandfather    Cancer Son        unknown    Social History:  Social History   Socioeconomic History   Marital status: Significant Other    Spouse name: Not on file   Number of children: 2   Years of education: Masters   Highest education level: Not on file  Occupational History   Not on file  Tobacco Use   Smoking status: Never   Smokeless tobacco: Never  Vaping Use   Vaping Use: Never used  Substance and Sexual Activity   Alcohol use: Yes    Alcohol/week: 1.0 standard drink of alcohol    Types: 1 Glasses of wine per week    Comment: daily   Drug use: No   Sexual activity: Yes    Partners:  Male  Other Topics Concern   Not on file  Social History Narrative   Lives   Caffeine use:    Drinks 16oz caffeine drinks a day    Social Determinants of Health   Financial Resource Strain: Not on file  Food Insecurity: Not on file  Transportation Needs: Not on file  Physical Activity: Not on file  Stress: Not on file  Social Connections: Not on file    Allergies: No Known Allergies  Metabolic Disorder Labs: Lab Results  Component Value Date   HGBA1C 5.7 (H) 02/05/2022   No results found for: "PROLACTIN" Lab Results  Component Value Date   CHOL 192 02/05/2022   TRIG 98 02/05/2022   HDL 67 02/05/2022   CHOLHDL 2.9 02/05/2022   VLDL 19.1 01/02/2015   LDLCALC 108 (H) 02/05/2022   LDLCALC 96 01/02/2015   Lab Results  Component Value Date   TSH 1.760 02/05/2022   TSH 1.04 01/02/2015    Therapeutic Level Labs: No results found for: "LITHIUM" No results found for: "VALPROATE" No results found for: "CBMZ"  Current Medications: Current Outpatient Medications  Medication Sig Dispense Refill   ARIPiprazole (ABILIFY) 5 MG tablet Take 1 tablet (5 mg total) by mouth daily. 30 tablet 1   busPIRone (BUSPAR) 30 MG tablet Take 1 tablet (30 mg total) by mouth 2 (two) times daily. 60 tablet 1   calcium-vitamin D (OSCAL WITH D) 250-125 MG-UNIT tablet Take 1 tablet by mouth daily.     escitalopram (LEXAPRO) 10 MG tablet Take 3 tablets (30 mg total) by mouth daily. 90 tablet 1   Multiple Vitamins-Minerals (MULTIVITAMIN PO) Take 1 tablet by mouth daily.     omeprazole (PRILOSEC) 10 MG capsule Take 10 mg by mouth daily.     Rimegepant Sulfate (NURTEC) 75 MG TBDP Take 75 mg by mouth as needed (take 1 at onset of headache, max is 1 tablet in 24 hours). 8 tablet 11   No current facility-administered medications for this visit.     Musculoskeletal: Strength & Muscle Tone: within normal limits Gait & Station: normal Patient leans: N/A  Psychiatric Specialty Exam: Review of  Systems  Respiratory:  Negative for shortness of breath.   Cardiovascular:  Negative for chest pain.  Gastrointestinal:  Negative for abdominal pain, constipation, diarrhea, nausea and vomiting.  Neurological:  Negative for dizziness, weakness and headaches.  Psychiatric/Behavioral:  Negative for dysphoric mood, hallucinations, sleep disturbance and suicidal ideas. The patient is nervous/anxious.     Blood pressure 126/85, pulse 67, height 5\' 5"  (1.651 m), weight 212 lb 6.4 oz (96.3 kg).Body  mass index is 35.35 kg/m.  General Appearance: Casual and Fairly Groomed  Eye Contact:  Good  Speech:  Clear and Coherent and Normal Rate  Volume:  Normal  Mood:  Anxious  Affect:  Congruent  Thought Process:  Coherent and Goal Directed  Orientation:  Full (Time, Place, and Person)  Thought Content: WDL and Logical   Suicidal Thoughts:  No  Homicidal Thoughts:  No  Memory:  Immediate;   Good Recent;   Good  Judgement:  Good  Insight:  Good  Psychomotor Activity:  Normal  Concentration:  Concentration: Good and Attention Span: Good  Recall:  Good  Fund of Knowledge: Good  Language: Good  Akathisia:  Negative  Handed:  Right  AIMS (if indicated): done AIMS= 0  Assets:  Communication Skills Desire for Improvement Housing Resilience  ADL's:  Intact  Cognition: WNL  Sleep:  Good   Screenings:   Assessment and Plan:  Verlyn Detar is a 67 yr old female who presents for Follow Up and Medication Management.  PPHx is significant for Depression and Anxiety, and no Psychiatric Hospitalizations.   [name] has had an increase in her anxiety over the last few weeks mainly due to work.  As she is currently on a supertherapeutic doses of Lexapro discussed the need to go to her PCP PCP to obtain the following-EKG, CMP, CBC, lipid panel, and A1c.  We will trial an addition of Remeron to her current medication regimen.  We will not make any other changes to her medications at time.  She will return  follow-up in approximately 6 weeks.  Called patient's pharmacy and canceled Lexapro 30 mg prescription.   MDD  GAD: -Continue Lexapro 20 mg daily for anxiety and depression.  30 tablets with 1 refill. -Continue Abilify 5 mg daily for augmentation.  30 tablets with 1 refill. -Start Remeron 7.5 mg QHS for anxiety and depression.  30 tablets with 1 refill. -Continue Buspar 30 mg BID for anxiety.  60 tablets with 1 refill.    Collaboration of Care: Collaboration of Care: Other provider involved in patient's care AEB Therapist  Patient/Guardian was advised Release of Information must be obtained prior to any record release in order to collaborate their care with an outside provider. Patient/Guardian was advised if they have not already done so to contact the registration department to sign all necessary forms in order for [place] to release information regarding their care.   Consent: Patient/Guardian gives verbal consent for treatment and assignment of benefits for services provided during this visit. Patient/Guardian expressed understanding and agreed to proceed.    Lauro Franklin, MD 09/18/2022, 8:37 AM

## 2022-09-18 NOTE — Addendum Note (Signed)
Addended by: Lauro Franklin on: 09/18/2022 09:55 AM   Modules accepted: Orders

## 2022-09-24 ENCOUNTER — Ambulatory Visit (INDEPENDENT_AMBULATORY_CARE_PROVIDER_SITE_OTHER): Payer: Medicare Other | Admitting: Psychology

## 2022-09-24 DIAGNOSIS — F411 Generalized anxiety disorder: Secondary | ICD-10-CM

## 2022-09-24 NOTE — Progress Notes (Signed)
Sharon Cole is a 67 y.o. female patient   09/24/2022  Treatment Plan: Diagnosis 296.32 (Major depressive affective disorder, recurrent episode, moderate) [n/a]  300.02 (Generalized anxiety disorder) [n/a]  Symptoms Depressed or irritable mood. (Status: maintained) -- No Description Entered  Feelings of hopelessness, worthlessness, or inappropriate guilt. (Status: maintained) -- No Description Entered  Lack of energy. (Status: maintained) -- No Description Entered  Low self-esteem. (Status: maintained) -- No Description Entered  Medication Status compliance  Safety none  If Suicidal or Homicidal State Action Taken: unspecified  Current Risk: low Medications Abilify (Dosage: .25mg )  Buspar (Dosage: 30mg )  Citalopram (Dosage: 20mg )  Topomax (Dosage: unknown)  Objectives Related Problem: Recognize, accept, and cope with feelings of depression. Description: Identify and replace thoughts and beliefs that support depression. Target Date: 2022-10-13 Frequency: Daily Modality: individual Progress: 60%  Related Problem: Recognize, accept, and cope with feelings of depression. Description: Learn and implement behavioral strategies to overcome depression. Target Date: 2022-10-13 Frequency: Daily Modality: individual Progress: 60%  Related Problem: Recognize, accept, and cope with feelings of depression. Description: Verbalize an understanding and resolution of current interpersonal problems. Target Date: 2022-10-13 Frequency: Daily Modality: individual Progress: 80%  Related Problem: Recognize, accept, and cope with feelings of depression. Description: Verbalize insight into how past relationships may be influencing current experiences with depression. Target Date: 2022-10-13 Frequency: Daily Modality: individual Progress: 80%  Client Response full compliance  Service Location Location, [address] [place], [place] [address]  Service Code cpt [id number]   Normalize/Reframe  Facilitate problem solving  Identify/label emotions  Validate/empathize  Emotion regulation skills  Self care activities  Lifestyle change (exercise, nutrition)  Self-monitoring  Identified an insight  Rationally challenge thoughts or beliefs/cognitive restructuring  Session notes:  F33.2  Goals: Wants to work on being genuine and being satisfied with herself and develop stronger self-esteem. Also, would like to improve her primary relationship and have her interpersonal and romantic needs met. This goal is now met, as she had ended the relationship. Wants to continue self-care and maintain weight loss. Needs to develop strategy to manage relationship with her son [name], who struggles with mental health issues. Goal date 12-24. 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 Will also attempt to create a small, but satisfying social network with like-minded people. Goal date is 12-24.   Meds:Citalopram (20mg ), Buspar, Abilify 5mg  , Remeron Patient agrees to video Caregility session. She is at home and I am at my home office.   [name] she had a long work week. Says she is "doing a little better" at work. She was prescribed Remeron by her doctor. She has not yet started the med, but plans to in the coming week. She has mixed feelings about it, saying that she doesn't know enough about the medication. She will be getting her "permanent" psychiatrist at her next visit as the one she saw this week was a resident. She was off yesterday and says she did not accomplish anything and stayed in pajamas all day. She just watched TV all day. Part of the problem is that she did not make any plans at all with no specific goals. We talked about making a date with herself to get things accomplished.  [name] that one of the women at worked asked her to "go out sometime" to have drinks together. [name] told her "yes" and they are going out June 7th to go hear  some music.  [name] is feeling positive about going. The guy that asked for her number twice never called. Has a dentist appointment tomorrow and was told it will be unpleasant. This makes her nervous and we talked about how she has "survived" the worst of it.  She has not heard anything from [place] or her ex-husband and that the lease is going to be ending son. Strategy is to remain quiet and passive until they reach out to her.                                                                                                                                                                                                                          [name], PhD  Time: 4:10p-5:00p 50 minutes.

## 2022-10-01 ENCOUNTER — Ambulatory Visit: Payer: Medicare Other | Admitting: Psychology

## 2022-10-08 ENCOUNTER — Ambulatory Visit (INDEPENDENT_AMBULATORY_CARE_PROVIDER_SITE_OTHER): Payer: Medicare Other | Admitting: Psychology

## 2022-10-08 DIAGNOSIS — F411 Generalized anxiety disorder: Secondary | ICD-10-CM

## 2022-10-08 NOTE — Progress Notes (Signed)
Sharon Cole is a 67 y.o. female patient   10/08/2022  Treatment Plan: Diagnosis 296.32 (Major depressive affective disorder, recurrent episode, moderate) [n/a]  300.02 (Generalized anxiety disorder) [n/a]  Symptoms Depressed or irritable mood. (Status: maintained) -- No Description Entered  Feelings of hopelessness, worthlessness, or inappropriate guilt. (Status: maintained) -- No Description Entered  Lack of energy. (Status: maintained) -- No Description Entered  Low self-esteem. (Status: maintained) -- No Description Entered  Medication Status compliance  Safety none  If Suicidal or Homicidal State Action Taken: unspecified  Current Risk: low Medications Abilify (Dosage: .25mg )  Buspar (Dosage: 30mg )  Citalopram (Dosage: 20mg )  Topomax (Dosage: unknown)  Objectives Related Problem: Recognize, accept, and cope with feelings of depression. Description: Identify and replace thoughts and beliefs that support depression. Target Date: 2023-04-14 Frequency: Daily Modality: individual Progress: 80%  Related Problem: Recognize, accept, and cope with feelings of depression. Description: Learn and implement behavioral strategies to overcome depression. Target Date: 2023-04-14 Frequency: Daily Modality: individual Progress: 70%  Related Problem: Recognize, accept, and cope with feelings of depression. Description: Verbalize an understanding and resolution of current interpersonal problems. Target Date: 2023-04-14 Frequency: Daily Modality: individual Progress: 90%  Related Problem: Recognize, accept, and cope with feelings of depression. Description: Verbalize insight into how past relationships may be influencing current experiences with depression. Target Date: 2023-04-14 Frequency: Daily Modality: individual Progress: 80%  Client Response full compliance  Service Location Location, [address] [place], [place] [address]   Service Code cpt [phone]  Normalize/Reframe  Facilitate problem solving  Identify/label emotions  Validate/empathize  Emotion regulation skills  Self care activities  Lifestyle change (exercise, nutrition)  Self-monitoring  Identified an insight  Rationally challenge thoughts or beliefs/cognitive restructuring  Session notes:  F33.2  Goals: Wants to work on being genuine and being satisfied with herself and develop stronger self-esteem. Also, would like to improve her primary relationship and have her interpersonal and romantic needs met. This goal is now met, as she had ended the relationship. Wants to continue self-care and maintain weight loss. Needs to develop strategy to manage relationship with her son Sharon Hua, who struggles with mental health issues. Goal date 12-24. 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 Will also attempt to create a small, but satisfying social network with like-minded people. Goal date is 12-24.   Meds:Citalopram (20mg ), Buspar, Abilify 5mg  , Remeron Patient agrees to video Caregility session. She is at home and I am at my home office.   Sharon Cole says that one of her mother's paintings will be dedicated at the [facility] to and will hang in [place]. She is frustrated with herself and needs to take better care of herself. She has gained back the weight she lost and says she needs to stop smoking CBD and eating gummies. She has been getting high before work and recognizes that she needs to regain control. Sheis not sure what to do about her eating situation, in which she is burned out of so many foods. We talked about the pros and cons of going on a structured diet. She feels she needs something regimented. Things at work are "a little better". She is more comfortable with the work, but says she wants a better schedule.  [name], PhD  Time: 4:15p-5:00p 45 minutes.

## 2022-10-15 ENCOUNTER — Ambulatory Visit (INDEPENDENT_AMBULATORY_CARE_PROVIDER_SITE_OTHER): Payer: Medicare Other | Admitting: Psychology

## 2022-10-15 DIAGNOSIS — F411 Generalized anxiety disorder: Secondary | ICD-10-CM

## 2022-10-15 NOTE — Progress Notes (Signed)
Sharon Cole is a 67 y.o. female patient   10/15/2022  Treatment Plan: Diagnosis 296.32 (Major depressive affective disorder, recurrent episode, moderate) [n/a]  300.02 (Generalized anxiety disorder) [n/a]  Symptoms Depressed or irritable mood. (Status: maintained) -- No Description Entered  Feelings of hopelessness, worthlessness, or inappropriate guilt. (Status: maintained) -- No Description Entered  Lack of energy. (Status: maintained) -- No Description Entered  Low self-esteem. (Status: maintained) -- No Description Entered  Medication Status compliance  Safety none  If Suicidal or Homicidal State Action Taken: unspecified  Current Risk: low Medications Abilify (Dosage: .25mg )  Buspar (Dosage: 30mg )  Citalopram (Dosage: 20mg )  Topomax (Dosage: unknown)  Objectives Related Problem: Recognize, accept, and cope with feelings of depression. Description: Identify and replace thoughts and beliefs that support depression. Target Date: 2023-04-14 Frequency: Daily Modality: individual Progress: 80%  Related Problem: Recognize, accept, and cope with feelings of depression. Description: Learn and implement behavioral strategies to overcome depression. Target Date: 2023-04-14 Frequency: Daily Modality: individual Progress: 70%  Related Problem: Recognize, accept, and cope with feelings of depression. Description: Verbalize an understanding and resolution of current interpersonal problems. Target Date: 2023-04-14 Frequency: Daily Modality: individual Progress: 90%  Related Problem: Recognize, accept, and cope with feelings of depression. Description: Verbalize insight into how past relationships may be influencing current experiences with depression. Target Date: 2023-04-14 Frequency: Daily Modality: individual Progress: 80%  Client Response full compliance  Service Location Location, [address] [place], [place] [address]  Service Code cpt [phone]  Normalize/Reframe  Facilitate problem solving  Identify/label emotions  Validate/empathize  Emotion regulation skills  Self care activities  Lifestyle change (exercise, nutrition)  Self-monitoring  Identified an insight  Rationally challenge thoughts or beliefs/cognitive restructuring  Session notes:  F33.2  Goals: Wants to work on being genuine and being satisfied with herself and develop stronger self-esteem. Also, would like to improve her primary relationship and have her interpersonal and romantic needs met. This goal is now met, as she had ended the relationship. Wants to continue self-care and maintain weight loss. Needs to develop strategy to manage relationship with her son Sharon Cole, who struggles with mental health issues. Goal date 12-24. 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 Will also attempt to create a small, but satisfying social network with like-minded people. Goal date is 12-24.   Meds:Citalopram (20mg ), Buspar, Abilify 5mg  , Remeron Patient agrees to video Caregility session. She is at home and I am at my home office.   Sharon Cole says that the dedication of her mother's art to the Sharon Cole went well. She felt very inhibited being around so many people because of her weight. She is considering the [place] Diet. 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 Says she is having a hard time reconciling her life "post Sharon Cole". Says the lifestyle change is tied to her art work. Says she is tied to the job and condo, which represents her living a more modest existence. States "I am struggling with who I am and what I am". Says she is even considering going back to school for psychology. Talked about what would be a reasonable "end game".  Sharon Ridgel, PhD  Time: 1:13p-2:00p 47 minutes.

## 2022-10-22 ENCOUNTER — Ambulatory Visit: Payer: Medicare Other | Admitting: Psychology

## 2022-10-23 ENCOUNTER — Ambulatory Visit: Payer: Medicare Other | Admitting: Psychology

## 2022-10-29 ENCOUNTER — Ambulatory Visit: Payer: Medicare Other | Admitting: Psychology

## 2022-11-01 ENCOUNTER — Ambulatory Visit (INDEPENDENT_AMBULATORY_CARE_PROVIDER_SITE_OTHER): Payer: Medicare Other | Admitting: Psychology

## 2022-11-01 DIAGNOSIS — F411 Generalized anxiety disorder: Secondary | ICD-10-CM

## 2022-11-01 NOTE — Progress Notes (Signed)
Sharon Cole is a 67 y.o. female patient   11/01/2022  Treatment Plan: Diagnosis 296.32 (Major depressive affective disorder, recurrent episode, moderate) [n/a]  300.02 (Generalized anxiety disorder) [n/a]  Symptoms Depressed or irritable mood. (Status: maintained) -- No Description Entered  Feelings of hopelessness, worthlessness, or inappropriate guilt. (Status: maintained) -- No Description Entered  Lack of energy. (Status: maintained) -- No Description Entered  Low self-esteem. (Status: maintained) -- No Description Entered  Medication Status compliance  Safety none  If Suicidal or Homicidal State Action Taken: unspecified  Current Risk: low Medications Abilify (Dosage: .25mg )  Buspar (Dosage: 30mg )  Citalopram (Dosage: 20mg )  Topomax (Dosage: unknown)  Objectives Related Problem: Recognize, accept, and cope with feelings of depression. Description: Identify and replace thoughts and beliefs that support depression. Target Date: 2023-04-14 Frequency: Daily Modality: individual Progress: 80%  Related Problem: Recognize, accept, and cope with feelings of depression. Description: Learn and implement behavioral strategies to overcome depression. Target Date: 2023-04-14 Frequency: Daily Modality: individual Progress: 70%  Related Problem: Recognize, accept, and cope with feelings of depression. Description: Verbalize an understanding and resolution of current interpersonal problems. Target Date: 2023-04-14 Frequency: Daily Modality: individual Progress: 90%  Related Problem: Recognize, accept, and cope with feelings of depression. Description: Verbalize insight into how past relationships may be influencing current experiences with depression. Target Date: 2023-04-14 Frequency: Daily Modality: individual Progress: 80%  Client Response full compliance  Service  Location Location, [address] [place], [place] [address]  Service Code cpt [phone]  Normalize/Reframe  Facilitate problem solving  Identify/label emotions  Validate/empathize  Emotion regulation skills  Self care activities  Lifestyle change (exercise, nutrition)  Self-monitoring  Identified an insight  Rationally challenge thoughts or beliefs/cognitive restructuring  Session notes:  F33.2  Goals: Wants to work on being genuine and being satisfied with herself and develop stronger self-esteem. Also, would like to improve her primary relationship and have her interpersonal and romantic needs met. This goal is now met, as she had ended the relationship. Wants to continue self-care and maintain weight loss. Needs to develop strategy to manage relationship with her son Sharon Cole, who struggles with mental health issues. Goal date 12-24. 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 Will also attempt to create a small, but satisfying social network with like-minded people. Goal date is 12-24.   Meds:Citalopram (20mg ), Buspar, Abilify 5mg  , Remeron Patient agrees to video Caregility session. She is at home and I am at my home office.   [name] went with her son and his family to [facility] for the week. She is feeling more relaxed after trip and the time away was good for her. Had dinner with her son [name] before she left. Dinner went well, but he is not in a good place. [name] did not wish his father a happy Father's Day. He is angry with father, but still takes his money. Good news is that [name] is in therapy and she is hopeful he will turn things around. She looked in to the option to return to school. Found most online programs are research and not clinical. She has stopped smoking all together. She does have cravings, but has been able to  resist. The expense has been a motivating factor to stay away from smoking. She met a "new guy" at work and feels their personalities  are aligned. She is hoping for a friendship. She has also started to eat according to the mediterranean diet. She is monitoring her weight and hopes that this new lifestyle will be beneficial.                                                                                                                                                                                                                                          [name], PhD  Time: 8:40a-9:30a 50 minutes.

## 2022-11-05 ENCOUNTER — Ambulatory Visit (INDEPENDENT_AMBULATORY_CARE_PROVIDER_SITE_OTHER): Payer: Medicare Other | Admitting: Psychology

## 2022-11-05 DIAGNOSIS — F411 Generalized anxiety disorder: Secondary | ICD-10-CM | POA: Diagnosis not present

## 2022-11-05 NOTE — Progress Notes (Signed)
Sharon Cole is a 67 y.o. female patient   11/05/2022  Treatment Plan: Diagnosis 296.32 (Major depressive affective disorder, recurrent episode, moderate) [n/a]  300.02 (Generalized anxiety disorder) [n/a]  Symptoms Depressed or irritable mood. (Status: maintained) -- No Description Entered  Feelings of hopelessness, worthlessness, or inappropriate guilt. (Status: maintained) -- No Description Entered  Lack of energy. (Status: maintained) -- No Description Entered  Low self-esteem. (Status: maintained) -- No Description Entered  Medication Status compliance  Safety none  If Suicidal or Homicidal State Action Taken: unspecified  Current Risk: low Medications Abilify (Dosage: .25mg )  Buspar (Dosage: 30mg )  Citalopram (Dosage: 20mg )  Topomax (Dosage: unknown)  Objectives Related Problem: Recognize, accept, and cope with feelings of depression. Description: Identify and replace thoughts and beliefs that support depression. Target Date: 2023-04-14 Frequency: Daily Modality: individual Progress: 80%  Related Problem: Recognize, accept, and cope with feelings of depression. Description: Learn and implement behavioral strategies to overcome depression. Target Date: 2023-04-14 Frequency: Daily Modality: individual Progress: 70%  Related Problem: Recognize, accept, and cope with feelings of depression. Description: Verbalize an understanding and resolution of current interpersonal problems. Target Date: 2023-04-14 Frequency: Daily Modality: individual Progress: 90%  Related Problem: Recognize, accept, and cope with feelings of depression. Description: Verbalize insight into how past relationships may be influencing current experiences with depression. Target Date: 2023-04-14 Frequency: Daily Modality: individual Progress: 80%  Client Response full compliance  Service  Location Location, [address] [place], [place] [address]  Service Code cpt [phone]  Normalize/Reframe  Facilitate problem solving  Identify/label emotions  Validate/empathize  Emotion regulation skills  Self care activities  Lifestyle change (exercise, nutrition)  Self-monitoring  Identified an insight  Rationally challenge thoughts or beliefs/cognitive restructuring  Session notes:  F33.2  Goals: Wants to work on being genuine and being satisfied with herself and develop stronger self-esteem. Also, would like to improve her primary relationship and have her interpersonal and romantic needs met. This goal is now met, as she had ended the relationship. Wants to continue self-care and maintain weight loss. Needs to develop strategy to manage relationship with her son Sharon Hua, who struggles with mental health issues. Goal date 12-24. 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 Will also attempt to create a small, but satisfying social network with like-minded people. Goal date is 12-24.   Meds:Citalopram (20mg ), Buspar, Abilify 5mg  , Remeron Patient agrees to video Caregility session and is aware of the limitations of this platform. She is at home and I am at my home office.   Sharon Cole says she is having some discomfort with her dental appliance. She texted the man she met the other day and he did not respond to her. She isn't sure if she is ready to try dating apps. She has continued to check in to graduate programs in psychology. She wants to get additional info and make a decision. Discussed advantages and disadvantages of different programs.  [name], PhD  Time: 4:15p-5:00p 45  minutes.               [name], PhD

## 2022-11-11 ENCOUNTER — Ambulatory Visit (HOSPITAL_COMMUNITY): Payer: Medicare Other | Admitting: Student

## 2022-11-12 ENCOUNTER — Ambulatory Visit (INDEPENDENT_AMBULATORY_CARE_PROVIDER_SITE_OTHER): Payer: Medicare Other | Admitting: Psychology

## 2022-11-12 DIAGNOSIS — F331 Major depressive disorder, recurrent, moderate: Secondary | ICD-10-CM

## 2022-11-12 DIAGNOSIS — F411 Generalized anxiety disorder: Secondary | ICD-10-CM

## 2022-11-12 NOTE — Progress Notes (Signed)
Sharon Cole is a 67 y.o. female patient   11/12/2022  Treatment Plan: Diagnosis 296.32 (Major depressive affective disorder, recurrent episode, moderate) [n/a]  300.02 (Generalized anxiety disorder) [n/a]  Symptoms Depressed or irritable mood. (Status: maintained) -- No Description Entered  Feelings of hopelessness, worthlessness, or inappropriate guilt. (Status: maintained) -- No Description Entered  Lack of energy. (Status: maintained) -- No Description Entered  Low self-esteem. (Status: maintained) -- No Description Entered  Medication Status compliance  Safety none  If Suicidal or Homicidal State Action Taken: unspecified  Current Risk: low Medications Abilify (Dosage: .25mg )  Buspar (Dosage: 30mg )  Citalopram (Dosage: 20mg )  Topomax (Dosage: unknown)  Objectives Related Problem: Recognize, accept, and cope with feelings of depression. Description: Identify and replace thoughts and beliefs that support depression. Target Date: 2023-04-14 Frequency: Daily Modality: individual Progress: 80%  Related Problem: Recognize, accept, and cope with feelings of depression. Description: Learn and implement behavioral strategies to overcome depression. Target Date: 2023-04-14 Frequency: Daily Modality: individual Progress: 70%  Related Problem: Recognize, accept, and cope with feelings of depression. Description: Verbalize an understanding and resolution of current interpersonal problems. Target Date: 2023-04-14 Frequency: Daily Modality: individual Progress: 90%  Related Problem: Recognize, accept, and cope with feelings of depression. Description: Verbalize insight into how past relationships may be influencing current experiences with depression. Target Date: 2023-04-14 Frequency: Daily Modality: individual Progress: 80%  Client Response full  compliance  Service Location Location, [address] [place], [place] [address]  Service Code cpt [phone]  Normalize/Reframe  Facilitate problem solving  Identify/label emotions  Validate/empathize  Emotion regulation skills  Self care activities  Lifestyle change (exercise, nutrition)  Self-monitoring  Identified an insight  Rationally challenge thoughts or beliefs/cognitive restructuring  Session notes:  F33.2  Goals/Plan: Wants to work on being genuine and being satisfied with herself and develop stronger self-esteem. Also, would like to improve her primary relationship and have her interpersonal and romantic needs met. This goal is now met, as she had ended the relationship. Wants to continue self-care and maintain weight loss. Needs to develop strategy to manage relationship with her son Sharon Hua, who struggles with mental health issues. Goal date 12-24. 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 Will also attempt to create a small, but satisfying social network with like-minded people. Goal date is 12-24.   Meds:Citalopram (20mg ), Buspar, Abilify 5mg  , Remeron Patient agrees to video Caregility session and is aware of the limitations of this platform. She is at home and I am at my home office.   Sharon Cole has been working hard and welcomes days off. She has looked into the [employer] and is now think it makes more sense to go to a MSW program. She is seeking an on-line program so she can pace herself. She is considering [employer] for [employer]. She needs to get a loan in order to be able to afford the program. I talked with her about recognizing the sacrifice it will take to be successful at completing this program. She feels she has an understanding of the necessary commitment. She reports  she feels drawn toward this profession and wants to move forward. 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 She  is not finding satisfaction and "the emotional piece". 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 Is clear she would rather "fill her time" with school work.                                                                                                                                                                                                                                                 [name], PhD  Time: 1:15p-2:00p 45 minutes.               [name], PhD

## 2022-11-19 ENCOUNTER — Ambulatory Visit: Payer: Medicare Other | Admitting: Psychology

## 2022-11-19 ENCOUNTER — Telehealth (HOSPITAL_COMMUNITY): Payer: Self-pay

## 2022-11-19 DIAGNOSIS — F411 Generalized anxiety disorder: Secondary | ICD-10-CM | POA: Diagnosis not present

## 2022-11-19 DIAGNOSIS — F331 Major depressive disorder, recurrent, moderate: Secondary | ICD-10-CM

## 2022-11-19 MED ORDER — ESCITALOPRAM OXALATE 20 MG PO TABS
20.0000 mg | ORAL_TABLET | Freq: Every day | ORAL | 0 refills | Status: DC
Start: 2022-11-19 — End: 2022-12-24

## 2022-11-19 MED ORDER — MIRTAZAPINE 7.5 MG PO TABS: 7.5000 mg | ORAL_TABLET | Freq: Every day | ORAL | 0 refills | Status: DC

## 2022-11-19 MED ORDER — BUSPIRONE HCL 30 MG PO TABS
30.0000 mg | ORAL_TABLET | Freq: Two times a day (BID) | ORAL | 0 refills | Status: DC
Start: 2022-11-19 — End: 2022-12-24

## 2022-11-19 MED ORDER — ARIPIPRAZOLE 5 MG PO TABS: 5.0000 mg | ORAL_TABLET | Freq: Every day | ORAL | 0 refills | Status: DC

## 2022-11-19 NOTE — Telephone Encounter (Signed)
Fax received from patients pharmacy for medication refill - please review and advise, thank you

## 2022-11-19 NOTE — Telephone Encounter (Signed)
Received message from patient's pharmacy that patient needed refills.  Will send in one month of medications to bridge patient to her first appointment with new provider Dr. Jerrel Ivory on 8/5.   Sent: -Continue Lexapro 20 mg daily for anxiety and depression.  30 tablets with 0 refills. -Continue Abilify 5 mg daily for augmentation.  30 tablets with 0 refills. -Continue Remeron 7.5 mg QHS for anxiety and depression.  30 tablets with 0 refills. -Continue Buspar 30 mg BID for anxiety.  60 tablets with 0 refills.   Arna Snipe MD Resident

## 2022-11-19 NOTE — Progress Notes (Addendum)
Sharon Cole is a 67 y.o. female patient   11/19/2022  Treatment Plan: Diagnosis 296.32 (Major depressive affective disorder, recurrent episode, moderate) [n/a]  300.02 (Generalized anxiety disorder) [n/a]  Symptoms Depressed or irritable mood. (Status: maintained) -- No Description Entered  Feelings of hopelessness, worthlessness, or inappropriate guilt. (Status: maintained) -- No Description Entered  Lack of energy. (Status: maintained) -- No Description Entered  Low self-esteem. (Status: maintained) -- No Description Entered  Medication Status compliance  Safety none  If Suicidal or Homicidal State Action Taken: unspecified  Current Risk: low Medications Abilify (Dosage: .25mg )  Buspar (Dosage: 30mg )  Citalopram (Dosage: 20mg )  Topomax (Dosage: unknown)  Objectives Related Problem: Recognize, accept, and cope with feelings of depression. Description: Identify and replace thoughts and beliefs that support depression. Target Date: 2023-04-14 Frequency: Daily Modality: individual Progress: 80%  Related Problem: Recognize, accept, and cope with feelings of depression. Description: Learn and implement behavioral strategies to overcome depression. Target Date: 2023-04-14 Frequency: Daily Modality: individual Progress: 70%  Related Problem: Recognize, accept, and cope with feelings of depression. Description: Verbalize an understanding and resolution of current interpersonal problems. Target Date: 2023-04-14 Frequency: Daily Modality: individual Progress: 90%  Related Problem: Recognize, accept, and cope with feelings of depression. Description: Verbalize insight into how past relationships may be influencing current experiences with depression. Target Date: 2023-04-14 Frequency: Daily Modality: individual Progress: 80%  Client Response full compliance  Service Location Location, [address] [place], [place] [address]  Service Code cpt [phone]   Normalize/Reframe  Facilitate problem solving  Identify/label emotions  Validate/empathize  Emotion regulation skills  Self care activities  Lifestyle change (exercise, nutrition)  Self-monitoring  Identified an insight  Rationally challenge thoughts or beliefs/cognitive restructuring  Session notes:  F33.2  Goals/Plan: Wants to work on being genuine and being satisfied with herself and develop stronger self-esteem. Also, would like to improve her primary relationship and have her interpersonal and romantic needs met. This goal is now met, as she had ended the relationship. Wants to continue self-care and maintain weight loss. Needs to develop strategy to manage relationship with her son Sharon Hua, who struggles with mental health issues. Goal date 12-24. 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 Will also attempt to create a small, but satisfying social network with like-minded people. Goal date is 12-24.   Meds:Citalopram (20mg ), Buspar, Abilify 5mg  , Remeron Patient agrees to video Caregility session and is aware of the limitations of this platform. She is at home and I am at my home office.   Sharon Cole says that she is feeling good. She was accepted into the on-line MSW program and she is awaiting the financial aid information. She acted on this immediately and is very pleased with herself. She is struggling with her diet and says much of the problem is that she is tired of proteins. She is pleased that her job is not overly demanding and that it will ease the stress of a busy new work/school schedule. She has not smoked or consumed gummies since we last discussed.  [name], PhD  Time: 4:13p-5:00p 47  minutes.

## 2022-11-26 ENCOUNTER — Ambulatory Visit (INDEPENDENT_AMBULATORY_CARE_PROVIDER_SITE_OTHER): Payer: Medicare Other | Admitting: Psychology

## 2022-11-26 DIAGNOSIS — F332 Major depressive disorder, recurrent severe without psychotic features: Secondary | ICD-10-CM | POA: Diagnosis not present

## 2022-11-26 DIAGNOSIS — F411 Generalized anxiety disorder: Secondary | ICD-10-CM

## 2022-11-26 NOTE — Progress Notes (Signed)
Sharon Cole is a 67 y.o. female patient   11/26/2022  Treatment Plan: Diagnosis 296.32 (Major depressive affective disorder, recurrent episode, moderate) [n/a]  300.02 (Generalized anxiety disorder) [n/a]  Symptoms Depressed or irritable mood. (Status: maintained) -- No Description Entered  Feelings of hopelessness, worthlessness, or inappropriate guilt. (Status: maintained) -- No Description Entered  Lack of energy. (Status: maintained) -- No Description Entered  Low self-esteem. (Status: maintained) -- No Description Entered  Medication Status compliance  Safety none  If Suicidal or Homicidal State Action Taken: unspecified  Current Risk: low Medications Abilify (Dosage: .25mg )  Buspar (Dosage: 30mg )  Citalopram (Dosage: 20mg )  Topomax (Dosage: unknown)  Objectives Related Problem: Recognize, accept, and cope with feelings of depression. Description: Identify and replace thoughts and beliefs that support depression. Target Date: 2023-04-14 Frequency: Daily Modality: individual Progress: 80%  Related Problem: Recognize, accept, and cope with feelings of depression. Description: Learn and implement behavioral strategies to overcome depression. Target Date: 2023-04-14 Frequency: Daily Modality: individual Progress: 70%  Related Problem: Recognize, accept, and cope with feelings of depression. Description: Verbalize an understanding and resolution of current interpersonal problems. Target Date: 2023-04-14 Frequency: Daily Modality: individual Progress: 90%  Related Problem: Recognize, accept, and cope with feelings of depression. Description: Verbalize insight into how past relationships may be influencing current experiences with depression. Target Date: 2023-04-14 Frequency: Daily Modality: individual Progress: 80%  Client Response full compliance  Service Location Location, [address] [place], [place] [address]   Service Code cpt [phone]  Normalize/Reframe  Facilitate problem solving  Identify/label emotions  Validate/empathize  Emotion regulation skills  Self care activities  Lifestyle change (exercise, nutrition)  Self-monitoring  Identified an insight  Rationally challenge thoughts or beliefs/cognitive restructuring  Session notes:  F33.2  Goals/Plan: Wants to work on being genuine and being satisfied with herself and develop stronger self-esteem. Also, would like to improve her primary relationship and have her interpersonal and romantic needs met. This goal is now met, as she had ended the relationship. Wants to continue self-care and maintain weight loss. Needs to develop strategy to manage relationship with her son [name], who struggles with mental health issues. Goal date 12-24. 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 Will also attempt to create a small, but satisfying social network with like-minded people. Goal date is 12-24.   Meds:Citalopram (20mg ), Buspar, Abilify 5mg  , Remeron Patient agrees to video Caregility session and is aware of the limitations of this platform. She is at home and I am at my home office.   [name] got more temporary dentures today. 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 She found out she was approved for 8500 for her student loan and she is now seeking funding from a different government source. She needs a total of $40,000 to complete. program.  She reports that she is more anxious at work than she would like and wants to "calm down". Her anxiety exists whether or not her boss is around. Her fear is "being called out" for mistakes. Talked about preparing for school in a way that will optimize her chance for success.  [name], PhD  Time: 1:10p-2:00p 50 minutes.

## 2022-12-03 ENCOUNTER — Ambulatory Visit (INDEPENDENT_AMBULATORY_CARE_PROVIDER_SITE_OTHER): Payer: Medicare Other | Admitting: Psychology

## 2022-12-03 DIAGNOSIS — F411 Generalized anxiety disorder: Secondary | ICD-10-CM | POA: Diagnosis not present

## 2022-12-03 DIAGNOSIS — F332 Major depressive disorder, recurrent severe without psychotic features: Secondary | ICD-10-CM

## 2022-12-03 NOTE — Progress Notes (Signed)
Sharon Cole is a 67 y.o. female patient   12/03/2022  Treatment Plan: Diagnosis 296.32 (Major depressive affective disorder, recurrent episode, moderate) [n/a]  300.02 (Generalized anxiety disorder) [n/a]  Symptoms Depressed or irritable mood. (Status: maintained) -- No Description Entered  Feelings of hopelessness, worthlessness, or inappropriate guilt. (Status: maintained) -- No Description Entered  Lack of energy. (Status: maintained) -- No Description Entered  Low self-esteem. (Status: maintained) -- No Description Entered  Medication Status compliance  Safety none  If Suicidal or Homicidal State Action Taken: unspecified  Current Risk: low Medications Abilify (Dosage: .25mg )  Buspar (Dosage: 30mg )  Citalopram (Dosage: 20mg )  Topomax (Dosage: unknown)  Objectives Related Problem: Recognize, accept, and cope with feelings of depression. Description: Identify and replace thoughts and beliefs that support depression. Target Date: 2023-04-14 Frequency: Daily Modality: individual Progress: 80%  Related Problem: Recognize, accept, and cope with feelings of depression. Description: Learn and implement behavioral strategies to overcome depression. Target Date: 2023-04-14 Frequency: Daily Modality: individual Progress: 70%  Related Problem: Recognize, accept, and cope with feelings of depression. Description: Verbalize an understanding and resolution of current interpersonal problems. Target Date: 2023-04-14 Frequency: Daily Modality: individual Progress: 90%  Related Problem: Recognize, accept, and cope with feelings of depression. Description: Verbalize insight into how past relationships may be influencing current experiences with depression. Target Date: 2023-04-14 Frequency: Daily Modality: individual Progress: 80%  Client Response full compliance  Service Location Location, [address] [place], [place] [address]  Service Code cpt [phone]  Normalize/Reframe  Facilitate problem solving  Identify/label emotions  Validate/empathize  Emotion regulation skills  Self care activities  Lifestyle change (exercise, nutrition)  Self-monitoring  Identified an insight  Rationally challenge thoughts or beliefs/cognitive restructuring  Session notes:  F33.2  Goals/Plan: Wants to work on being genuine and being satisfied with herself and develop stronger self-esteem. Also, would like to improve her primary relationship and have her interpersonal and romantic needs met. This goal is now met, as she had ended the relationship. Wants to continue self-care and maintain weight loss. Needs to develop strategy to manage relationship with her son Sharon Cole, who struggles with mental health issues. Goal date 12-24. 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 Will also attempt to create a small, but satisfying social network with like-minded people. Goal date is 12-24.   Meds:Citalopram (20mg ), Buspar, Abilify 5mg  , Remeron Patient agrees to video Caregility session and is aware of the limitations of this platform. She is at home and I am at my home office.   Sharon Cole says that is is preparing for school. She has books and a scheduled a meeting with her advisor. She is enrolled in a 5 week course and an 11 week course. We talked about how to best start the semester to get her into the academic mode.She took [name] out to dinner and "it ws very nice". She was also supposed to go out with a friend from work over the weekend, but they had trouble connecting. They are rescheduling to get together. This is a very positive move for Sharon Cole, who has always struggled with friendships.  [name], PhD  Time: 4:10p-5:00p 50 minutes.

## 2022-12-09 ENCOUNTER — Ambulatory Visit (HOSPITAL_BASED_OUTPATIENT_CLINIC_OR_DEPARTMENT_OTHER): Payer: Medicare Other | Admitting: Student

## 2022-12-09 ENCOUNTER — Encounter (HOSPITAL_COMMUNITY): Payer: Self-pay | Admitting: Student

## 2022-12-09 VITALS — BP 118/76 | HR 70 | Wt 214.0 lb

## 2022-12-09 DIAGNOSIS — F411 Generalized anxiety disorder: Secondary | ICD-10-CM | POA: Diagnosis not present

## 2022-12-09 DIAGNOSIS — F324 Major depressive disorder, single episode, in partial remission: Secondary | ICD-10-CM

## 2022-12-09 MED ORDER — MIRTAZAPINE 15 MG PO TABS
15.0000 mg | ORAL_TABLET | Freq: Every day | ORAL | 2 refills | Status: DC
Start: 2022-12-09 — End: 2022-12-24

## 2022-12-09 NOTE — Progress Notes (Signed)
BH MD Outpatient Progress Note  12/09/2022 8:49 AM GIAVONA Cole  MRN:  [id number]  Assessment:  [name] presents for follow-up evaluation in-person. Today, 12/09/22, patient reports her primary complaint is anxiety related to her work life (specifically a supervisor she feels has belittled her).  She reports that her anxiety is somewhat worse than her last visit in May, but it is notable that she stopped using marijuana approximately 1 month ago (which may account for some worsening).  And based on her report, it seems that she is functioning well, currently working towards a degree in social work while maintaining her regular job.  She reports benefit from the starting dose of Remeron, so we will increase this medication to 15 mg nightly.  We also discussed the need for the patient's Lexapro to eventually be decreased from 20 mg to 10 mg daily, due to concerns about QT prolongation in elderly patients.  This change was not made today due to the fact that this is my first time meeting the patient and because of the patient's report of worsening anxiety.  Identifying Information: Sharon Cole is a 67 y.o. y.o. female with a history of generalized anxiety disorder and major depressive disorder, moderate, who is an established patient with [facility] for management of anxiety.   Plan:  # Generalized anxiety disorder  Hx major depressive disorder, moderate Interventions: -- Continue Lexapro 20 mg daily - Increase Remeron from 7.5 mg nightly to 15 mg nightly - Continue Abilify 5 mg nightly for augmentation of antidepressant medications - Continue buspirone 30 mg twice daily - Continue therapy with Dr. Dellia Cloud  # Cannabis use disorder, in early remission Interventions: -- Continue to assess sobriety - Discussed that marijuana will reduce the effectiveness of medication for anxiety  #Long term use of antipsychotic medication -- Lipid panel from October  2023 with LDL of 108 - A1c from October 2023 5.7 - Will discuss the need for the patient to obtain an EKG with her primary care physician   Patient was given contact information for behavioral health clinic and was instructed to call 911 for emergencies.   Subjective:  Chief Complaint:  Chief Complaint  Patient presents with   Follow-up    Interval History:  The patient reports that her primary complaint is anxiety.  She states that most of her anxiety is related to her frustrating job at [employer].  She talks about how her boss belittled her in front of her colleagues and this has bothered her for a long period of time.  She states that she is currently working on a masters in social work and feels that this will be a more rewarding occupation.  She denies experiencing panic attacks.  She states that her anxiety has worsened somewhat since her visit in May.  The patient reports a mood that is "normal".  She denies experiencing depression over the past several months.  She denies experiencing any hopelessness and states that she has not had any suicidal thoughts.  She reports good sleep on a regular basis, sleeping approximately 8 to 9 hours.  She reports that her relationship with food is troubled but states that she is eating regularly.  She reports a good relationship with her 2 adult children who live in [place].  She states that she is trying to reach out and make more friends.  The patient reports drinking alcohol approximately 1 time per month.  She reports using marijuana every day for a  long period of time, but says that she stopped approximately 1 month ago due to the expense of buying Gummies and smoking.  She states that she plans to stay away from marijuana because of upcoming schoolwork.  She does not feel that the marijuana was treating her anxiety or helping her sleep.  She states that she simply enjoys the feeling it gives her.  She denies using any other illegal drugs.  Visit  Diagnosis:    ICD-10-CM   1. GAD (generalized anxiety disorder)  F41.1     2. Major depressive disorder in partial remission, unspecified whether recurrent (HCC)  F32.4       Past Psychiatric History: The patient denies any history of suicide attempts, no identifiable psychiatric hospitalizations in the medical record  Past Medical History:  Past Medical History:  Diagnosis Date   Anxiety    Bradycardia    Common migraine with intractable migraine 07/03/2016   Depression    Dizziness    Headache    Menopause    Syncope     Past Surgical History:  Procedure Laterality Date   BACK SURGERY     cyst removal    BREAST SURGERY     breast reduction   BUNIONECTOMY     CHOLECYSTECTOMY     KNEE ARTHROSCOPY      Family Psychiatric History: None pertinent  Family History:  Family History  Problem Relation Age of Onset   Heart disease Mother    Heart disease Brother    Heart disease Maternal Grandmother    Heart disease Maternal Grandfather    Cancer Son        unknown    Social History:  Social History   Socioeconomic History   Marital status: Significant Other    Spouse name: Not on file   Number of children: 2   Years of education: Masters   Highest education level: Not on file  Occupational History   Not on file  Tobacco Use   Smoking status: Never   Smokeless tobacco: Never  Vaping Use   Vaping status: Never Used  Substance and Sexual Activity   Alcohol use: Yes    Alcohol/week: 1.0 standard drink of alcohol    Types: 1 Glasses of wine per week    Comment: daily   Drug use: No   Sexual activity: Yes    Partners: Male  Other Topics Concern   Not on file  Social History Narrative   Lives   Caffeine use:    Drinks 16oz caffeine drinks a day    Social Determinants of Corporate investment banker Strain: Not on file  Food Insecurity: Not on file  Transportation Needs: Not on file  Physical Activity: Not on file  Stress: Not on file  Social  Connections: Not on file    Allergies: No Known Allergies  Current Medications: Current Outpatient Medications  Medication Sig Dispense Refill   ARIPiprazole (ABILIFY) 5 MG tablet Take 1 tablet (5 mg total) by mouth daily. 30 tablet 0   busPIRone (BUSPAR) 30 MG tablet Take 1 tablet (30 mg total) by mouth 2 (two) times daily. 60 tablet 0   calcium-vitamin D (OSCAL WITH D) 250-125 MG-UNIT tablet Take 1 tablet by mouth daily.     escitalopram (LEXAPRO) 20 MG tablet Take 1 tablet (20 mg total) by mouth daily. 30 tablet 0   mirtazapine (REMERON) 7.5 MG tablet Take 1 tablet (7.5 mg total) by mouth at bedtime. 30 tablet 0  Multiple Vitamins-Minerals (MULTIVITAMIN PO) Take 1 tablet by mouth daily.     omeprazole (PRILOSEC) 10 MG capsule Take 10 mg by mouth daily.     Rimegepant Sulfate (NURTEC) 75 MG TBDP Take 75 mg by mouth as needed (take 1 at onset of headache, max is 1 tablet in 24 hours). 8 tablet 11   No current facility-administered medications for this visit.     Objective:  Psychiatric Specialty Exam: Physical Exam Constitutional:      Appearance: the patient is not toxic-appearing.  Pulmonary:     Effort: Pulmonary effort is normal.  Neurological:     General: No focal deficit present.     Mental Status: the patient is alert and oriented to person, place, and time.   Review of Systems  Respiratory:  Negative for shortness of breath.   Cardiovascular:  Negative for chest pain.  Gastrointestinal:  Negative for abdominal pain, constipation, diarrhea, nausea and vomiting.  Neurological:  Negative for headaches.      BP 118/76   Pulse 70   Wt 214 lb (97.1 kg)   BMI 35.61 kg/m   General Appearance: Fairly Groomed  Eye Contact:  Good  Speech:  Clear and Coherent  Volume:  Normal  Mood:  Euthymic  Affect:  Congruent  Thought Process:  Coherent  Orientation:  Full (Time, Place, and Person)  Thought Content: Logical   Suicidal Thoughts:  No  Homicidal Thoughts:  No   Memory:  Immediate;   Good  Judgement:  fair  Insight:  fair  Psychomotor Activity:  Normal  Concentration:  Concentration: Good  Recall:  Good  Fund of Knowledge: Good  Language: Good  Akathisia:  No  Handed:    AIMS (if indicated): not done  Assets:  Communication Skills Desire for Improvement Financial Resources/Insurance Housing Leisure Time Physical Health  ADL's:  Intact  Cognition: WNL  Sleep:  Fair     Metabolic Disorder Labs: Lab Results  Component Value Date   HGBA1C 5.7 (H) 02/05/2022   No results found for: "PROLACTIN" Lab Results  Component Value Date   CHOL 192 02/05/2022   TRIG 98 02/05/2022   HDL 67 02/05/2022   CHOLHDL 2.9 02/05/2022   VLDL 16.1 01/02/2015   LDLCALC 108 (H) 02/05/2022   LDLCALC 96 01/02/2015   Lab Results  Component Value Date   TSH 1.760 02/05/2022   TSH 1.04 01/02/2015    Therapeutic Level Labs: No results found for: "LITHIUM" No results found for: "VALPROATE" No results found for: "CBMZ"  Screenings:  Collaboration of Care: none  A total of 30 minutes was spent involved in face to face clinical care, chart review, documentation.   Carlyn Reichert, MD 12/09/2022, 8:49 AM

## 2022-12-09 NOTE — Addendum Note (Signed)
Addended by: Everlena Cooper on: 12/09/2022 11:16 AM   Modules accepted: Level of Service

## 2022-12-10 ENCOUNTER — Ambulatory Visit: Payer: Medicare Other | Admitting: Psychology

## 2022-12-24 ENCOUNTER — Ambulatory Visit (INDEPENDENT_AMBULATORY_CARE_PROVIDER_SITE_OTHER): Payer: Medicare Other | Admitting: Psychology

## 2022-12-24 ENCOUNTER — Other Ambulatory Visit (HOSPITAL_COMMUNITY): Payer: Self-pay | Admitting: Student

## 2022-12-24 ENCOUNTER — Telehealth (HOSPITAL_COMMUNITY): Payer: Self-pay

## 2022-12-24 DIAGNOSIS — F411 Generalized anxiety disorder: Secondary | ICD-10-CM

## 2022-12-24 DIAGNOSIS — F332 Major depressive disorder, recurrent severe without psychotic features: Secondary | ICD-10-CM

## 2022-12-24 DIAGNOSIS — F331 Major depressive disorder, recurrent, moderate: Secondary | ICD-10-CM

## 2022-12-24 DIAGNOSIS — F324 Major depressive disorder, single episode, in partial remission: Secondary | ICD-10-CM

## 2022-12-24 MED ORDER — ARIPIPRAZOLE 5 MG PO TABS
5.0000 mg | ORAL_TABLET | Freq: Every day | ORAL | 2 refills | Status: DC
Start: 2022-12-24 — End: 2023-03-19

## 2022-12-24 MED ORDER — MIRTAZAPINE 15 MG PO TABS
15.0000 mg | ORAL_TABLET | Freq: Every day | ORAL | 2 refills | Status: DC
Start: 2022-12-24 — End: 2023-03-19

## 2022-12-24 MED ORDER — ESCITALOPRAM OXALATE 20 MG PO TABS
20.0000 mg | ORAL_TABLET | Freq: Every day | ORAL | 1 refills | Status: DC
Start: 2022-12-24 — End: 2023-03-19

## 2022-12-24 MED ORDER — BUSPIRONE HCL 30 MG PO TABS
30.0000 mg | ORAL_TABLET | Freq: Two times a day (BID) | ORAL | 2 refills | Status: DC
Start: 2022-12-24 — End: 2023-03-19

## 2022-12-24 NOTE — Progress Notes (Signed)
Sharon Cole is a 67 y.o. female patient   12/24/2022  Treatment Plan: Diagnosis 296.32 (Major depressive affective disorder, recurrent episode, moderate) [n/a]  300.02 (Generalized anxiety disorder) [n/a]  Symptoms Depressed or irritable mood. (Status: maintained) -- No Description Entered  Feelings of hopelessness, worthlessness, or inappropriate guilt. (Status: maintained) -- No Description Entered  Lack of energy. (Status: maintained) -- No Description Entered  Low self-esteem. (Status: maintained) -- No Description Entered  Medication Status compliance  Safety none  If Suicidal or Homicidal State Action Taken: unspecified  Current Risk: low Medications Abilify (Dosage: .25mg )  Buspar (Dosage: 30mg )  Citalopram (Dosage: 20mg )  Topomax (Dosage: unknown)  Objectives Related Problem: Recognize, accept, and cope with feelings of depression. Description: Identify and replace thoughts and beliefs that support depression. Target Date: 2023-04-14 Frequency: Daily Modality: individual Progress: 80%  Related Problem: Recognize, accept, and cope with feelings of depression. Description: Learn and implement behavioral strategies to overcome depression. Target Date: 2023-04-14 Frequency: Daily Modality: individual Progress: 70%  Related Problem: Recognize, accept, and cope with feelings of depression. Description: Verbalize an understanding and resolution of current interpersonal problems. Target Date: 2023-04-14 Frequency: Daily Modality: individual Progress: 90%  Related Problem: Recognize, accept, and cope with feelings of depression. Description: Verbalize insight into how past relationships may be influencing current experiences with depression. Target Date: 2023-04-14 Frequency: Daily Modality: individual Progress: 80%  Client Response full compliance  Service  Location Location, [address] [place], [place] [address]  Service Code cpt [phone]  Normalize/Reframe  Facilitate problem solving  Identify/label emotions  Validate/empathize  Emotion regulation skills  Self care activities  Lifestyle change (exercise, nutrition)  Self-monitoring  Identified an insight  Rationally challenge thoughts or beliefs/cognitive restructuring  Session notes:  F33.2  Goals/Plan: Wants to work on being genuine and being satisfied with herself and develop stronger self-esteem. Also, would like to improve her primary relationship and have her interpersonal and romantic needs met. This goal is now met, as she had ended the relationship. Wants to continue self-care and maintain weight loss. Needs to develop strategy to manage relationship with her son Sharon Cole, who struggles with mental health issues. Goal date 12-24. 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 Will also attempt to create a small, but satisfying social network with like-minded people. Goal date is 12-24.   Meds:Citalopram (20mg ), Buspar, Abilify 5mg  , Remeron Patient agrees to video Caregility session and is aware of the limitations of this platform. She is at home and I am at my home office.   Sharon Cole says that her work has been going better. Her Remeron is increased from 7.5 mg to 15 mg. Plan is to reduce Citalopram, which will happen at her next visit. This is due to some of the cardiac risks. She feels her anxiety at work is greatly reduced. She starts school on Monday and we discussed how to best prepare to start classes. 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 Discussed the best approach to begin school to maximize success.  [name], PhD  Time: 1:15p-2:00p 50 minutes.

## 2022-12-24 NOTE — Telephone Encounter (Signed)
Fax from patients pharmacy requesting a refill on Escitalopram 20 mg, Mirtazapine 7.5 mg, and Abilify 5 mg. These were last prescribed by Trinna Post on 7/16 with no refills. Patient was seen by you on 8/5 but no meds were sent in. I did not see mention of discontinuing in your note, however I did see you made a med change. Please review and advise, thank you

## 2022-12-31 ENCOUNTER — Ambulatory Visit (INDEPENDENT_AMBULATORY_CARE_PROVIDER_SITE_OTHER): Payer: Medicare Other | Admitting: Psychology

## 2022-12-31 DIAGNOSIS — F332 Major depressive disorder, recurrent severe without psychotic features: Secondary | ICD-10-CM

## 2022-12-31 DIAGNOSIS — F331 Major depressive disorder, recurrent, moderate: Secondary | ICD-10-CM

## 2022-12-31 NOTE — Progress Notes (Signed)
Sharon Cole is a 67 y.o. female patient   12/31/2022  Treatment Plan: Diagnosis 296.32 (Major depressive affective disorder, recurrent episode, moderate) [n/a]  300.02 (Generalized anxiety disorder) [n/a]  Symptoms Depressed or irritable mood. (Status: maintained) -- No Description Entered  Feelings of hopelessness, worthlessness, or inappropriate guilt. (Status: maintained) -- No Description Entered  Lack of energy. (Status: maintained) -- No Description Entered  Low self-esteem. (Status: maintained) -- No Description Entered  Medication Status compliance  Safety none  If Suicidal or Homicidal State Action Taken: unspecified  Current Risk: low Medications Abilify (Dosage: .25mg )  Buspar (Dosage: 30mg )  Citalopram (Dosage: 20mg )  Topomax (Dosage: unknown)  Objectives Related Problem: Recognize, accept, and cope with feelings of depression. Description: Identify and replace thoughts and beliefs that support depression. Target Date: 2023-04-14 Frequency: Daily Modality: individual Progress: 80%  Related Problem: Recognize, accept, and cope with feelings of depression. Description: Learn and implement behavioral strategies to overcome depression. Target Date: 2023-04-14 Frequency: Daily Modality: individual Progress: 70%  Related Problem: Recognize, accept, and cope with feelings of depression. Description: Verbalize an understanding and resolution of current interpersonal problems. Target Date: 2023-04-14 Frequency: Daily Modality: individual Progress: 90%  Related Problem: Recognize, accept, and cope with feelings of depression. Description: Verbalize insight into how past relationships may be influencing current experiences with depression. Target Date: 2023-04-14 Frequency: Daily Modality: individual Progress: 80%  Client Response full  compliance  Service Location Location, [address] [place], [place] [address]  Service Code cpt [id number]  Normalize/Reframe  Facilitate problem solving  Identify/label emotions  Validate/empathize  Emotion regulation skills  Self care activities  Lifestyle change (exercise, nutrition)  Self-monitoring  Identified an insight  Rationally challenge thoughts or beliefs/cognitive restructuring  Session notes:  F33.2  Goals/Plan: Wants to work on being genuine and being satisfied with herself and develop stronger self-esteem. Also, would like to improve her primary relationship and have her interpersonal and romantic needs met. This goal is now met, as she had ended the relationship. Wants to continue self-care and maintain weight loss. Needs to develop strategy to manage relationship with her son Sharon Hua, who struggles with mental health issues. Goal date 12-24. 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 Will also attempt to create a small, but satisfying social network with like-minded people. Goal date is 12-24.   Meds:Citalopram (20mg ), Buspar, Abilify 5mg  , Remeron Patient agrees to video Caregility session and is aware of the limitations of this platform. She is at home and I am at my home office.   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 She is struggling to stay awake to read the text book. Took a day off work to stay ahead of the reading. She is not panicking and feels she can use the available support if needed. We talked about the importance of balance and self-care. She also wants to discuss her weight at next session. She is very frustrated by not being able to get control of it. She is seeking a more permanent solution.  [name], PhD  Time: 4:15p-5:00p 45 minutes.

## 2023-01-07 ENCOUNTER — Ambulatory Visit (INDEPENDENT_AMBULATORY_CARE_PROVIDER_SITE_OTHER): Payer: Medicare Other | Admitting: Psychology

## 2023-01-07 DIAGNOSIS — F332 Major depressive disorder, recurrent severe without psychotic features: Secondary | ICD-10-CM

## 2023-01-07 DIAGNOSIS — F331 Major depressive disorder, recurrent, moderate: Secondary | ICD-10-CM

## 2023-01-07 NOTE — Progress Notes (Signed)
Sharon Cole is a 67 y.o. female patient   01/07/2023  Treatment Plan: Diagnosis 296.32 (Major depressive affective disorder, recurrent episode, moderate) [n/a]  300.02 (Generalized anxiety disorder) [n/a]  Symptoms Depressed or irritable mood. (Status: maintained) -- No Description Entered  Feelings of hopelessness, worthlessness, or inappropriate guilt. (Status: maintained) -- No Description Entered  Lack of energy. (Status: maintained) -- No Description Entered  Low self-esteem. (Status: maintained) -- No Description Entered  Medication Status compliance  Safety none  If Suicidal or Homicidal State Action Taken: unspecified  Current Risk: low Medications Abilify (Dosage: .25mg )  Buspar (Dosage: 30mg )  Citalopram (Dosage: 20mg )  Topomax (Dosage: unknown)  Objectives Related Problem: Recognize, accept, and cope with feelings of depression. Description: Identify and replace thoughts and beliefs that support depression. Target Date: 2023-04-14 Frequency: Daily Modality: individual Progress: 80%  Related Problem: Recognize, accept, and cope with feelings of depression. Description: Learn and implement behavioral strategies to overcome depression. Target Date: 2023-04-14 Frequency: Daily Modality: individual Progress: 70%  Related Problem: Recognize, accept, and cope with feelings of depression. Description: Verbalize an understanding and resolution of current interpersonal problems. Target Date: 2023-04-14 Frequency: Daily Modality: individual Progress: 90%  Related Problem: Recognize, accept, and cope with feelings of depression. Description: Verbalize insight into how past relationships may be influencing current experiences with depression. Target Date: 2023-04-14 Frequency: Daily Modality: individual Progress:  80%  Client Response full compliance  Service Location Location, [address] [place], [place] [address]  Service Code cpt [phone]  Normalize/Reframe  Facilitate problem solving  Identify/label emotions  Validate/empathize  Emotion regulation skills  Self care activities  Lifestyle change (exercise, nutrition)  Self-monitoring  Identified an insight  Rationally challenge thoughts or beliefs/cognitive restructuring  Session notes:  F33.2  Goals/Plan: Wants to work on being genuine and being satisfied with herself and develop stronger self-esteem. Also, would like to improve her primary relationship and have her interpersonal and romantic needs met. This goal is now met, as she had ended the relationship. Wants to continue self-care and maintain weight loss. Needs to develop strategy to manage relationship with her son Sharon Cole, who struggles with mental health issues. Goal date 12-24. 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 Will also attempt to create a small, but satisfying social network with like-minded people. Goal date is 12-24.   Meds:Citalopram (20mg ), Buspar, Abilify 5mg  , Remeron Patient agrees to video Caregility session and is aware of the limitations of this platform. She is at home and I am at my home office.   Sharon Cole has called out of work several times to keep up with her homework. She is thinking/hoping that she will not be having to call out as much moving forward. We talked about the need for [name] to plan ahead in order to maintain balance in her life. This will be a challenge. She has already had evening when she was up to 2 am to finish an assignment. She recognizes that this is not a sustainable schedule. She also  says that she continues to be frustrated with her inability to contain her eating. She is giving herself a "break" given she has just  started her school schedule. We talked about the "day at  time" strategy.                                                                                                                                                                                                                                                       [name], PhD  Time: 1:15p-2:00p 45 minutes.

## 2023-01-14 ENCOUNTER — Ambulatory Visit (INDEPENDENT_AMBULATORY_CARE_PROVIDER_SITE_OTHER): Payer: Medicare Other | Admitting: Psychology

## 2023-01-14 DIAGNOSIS — F331 Major depressive disorder, recurrent, moderate: Secondary | ICD-10-CM

## 2023-01-14 DIAGNOSIS — F332 Major depressive disorder, recurrent severe without psychotic features: Secondary | ICD-10-CM

## 2023-01-14 NOTE — Progress Notes (Signed)
Sharon Cole is a 66 y.o. female patient   01/14/2023  Treatment Plan: Diagnosis 296.32 (Major depressive affective disorder, recurrent episode, moderate) [n/a]  300.02 (Generalized anxiety disorder) [n/a]  Symptoms Depressed or irritable mood. (Status: maintained) -- No Description Entered  Feelings of hopelessness, worthlessness, or inappropriate guilt. (Status: maintained) -- No Description Entered  Lack of energy. (Status: maintained) -- No Description Entered  Low self-esteem. (Status: maintained) -- No Description Entered  Medication Status compliance  Safety none  If Suicidal or Homicidal State Action Taken: unspecified  Current Risk: low Medications Abilify (Dosage: .25mg )  Buspar (Dosage: 30mg )  Citalopram (Dosage: 20mg )  Topomax (Dosage: unknown)  Objectives Related Problem: Recognize, accept, and cope with feelings of depression. Description: Identify and replace thoughts and beliefs that support depression. Target Date: 2023-04-14 Frequency: Daily Modality: individual Progress: 80%  Related Problem: Recognize, accept, and cope with feelings of depression. Description: Learn and implement behavioral strategies to overcome depression. Target Date: 2023-04-14 Frequency: Daily Modality: individual Progress: 70%  Related Problem: Recognize, accept, and cope with feelings of depression. Description: Verbalize an understanding and resolution of current interpersonal problems. Target Date: 2023-04-14 Frequency: Daily Modality: individual Progress: 90%  Related Problem: Recognize, accept, and cope with feelings of depression. Description: Verbalize insight into how past relationships may be influencing current experiences with depression. Target Date: 2023-04-14 Frequency:  Daily Modality: individual Progress: 80%  Client Response full compliance  Service Location Location, [address] [place], [place] [address]  Service Code cpt [phone]  Normalize/Reframe  Facilitate problem solving  Identify/label emotions  Validate/empathize  Emotion regulation skills  Self care activities  Lifestyle change (exercise, nutrition)  Self-monitoring  Identified an insight  Rationally challenge thoughts or beliefs/cognitive restructuring  Session notes:  F33.2  Goals/Plan: Wants to work on being genuine and being satisfied with herself and develop stronger self-esteem. Also, would like to improve her primary relationship and have her interpersonal and romantic needs met. This goal is now met, as she had ended the relationship. Wants to continue self-care and maintain weight loss. Needs to develop strategy to manage relationship with her son Sharon Hua, who struggles with mental health issues. Goal date 12-24. 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 Will also attempt to create a small, but satisfying social network with like-minded people. Goal date is 12-24.   Meds:Citalopram (20mg ), Buspar, Abilify 5mg  , Remeron Patient agrees to video Caregility session and is aware of the limitations of this platform. She is at home and I am at my home office.   Sharon Cole says she might have to take another day off work to get her school work done. This is becoming difficult in that it is hard to stay caught up when working. Creates a great deal of stress for her to call out of work and she prefers not to do it that way. She is totally consumed with school and work and feels she  has no spare time. States that she has not met with son Sharon Cole in a long time, but he is not doing well.                                                                                                                                                                                                                                                                             Sharon Ridgel, PhD  Time: 4:10p-5:00p 50 minutes.

## 2023-01-20 ENCOUNTER — Ambulatory Visit (HOSPITAL_COMMUNITY): Payer: Medicare Other | Admitting: Student

## 2023-01-21 ENCOUNTER — Ambulatory Visit: Payer: Medicare Other | Admitting: Psychology

## 2023-01-28 ENCOUNTER — Ambulatory Visit (INDEPENDENT_AMBULATORY_CARE_PROVIDER_SITE_OTHER): Payer: Medicare Other | Admitting: Psychology

## 2023-01-28 DIAGNOSIS — F332 Major depressive disorder, recurrent severe without psychotic features: Secondary | ICD-10-CM

## 2023-01-28 DIAGNOSIS — F331 Major depressive disorder, recurrent, moderate: Secondary | ICD-10-CM

## 2023-01-28 NOTE — Progress Notes (Signed)
Sharon Cole is a 67 y.o. female patient   01/28/2023  Treatment Plan: Diagnosis 296.32 (Major depressive affective disorder, recurrent episode, moderate) [n/a]  300.02 (Generalized anxiety disorder) [n/a]  Symptoms Depressed or irritable mood. (Status: maintained) -- No Description Entered  Feelings of hopelessness, worthlessness, or inappropriate guilt. (Status: maintained) -- No Description Entered  Lack of energy. (Status: maintained) -- No Description Entered  Low self-esteem. (Status: maintained) -- No Description Entered  Medication Status compliance  Safety none  If Suicidal or Homicidal State Action Taken: unspecified  Current Risk: low Medications Abilify (Dosage: .25mg )  Buspar (Dosage: 30mg )  Citalopram (Dosage: 20mg )  Topomax (Dosage: unknown)  Objectives Related Problem: Recognize, accept, and cope with feelings of depression. Description: Identify and replace thoughts and beliefs that support depression. Target Date: 2023-04-14 Frequency: Daily Modality: individual Progress: 80%  Related Problem: Recognize, accept, and cope with feelings of depression. Description: Learn and implement behavioral strategies to overcome depression. Target Date: 2023-04-14 Frequency: Daily Modality: individual Progress: 70%  Related Problem: Recognize, accept, and cope with feelings of depression. Description: Verbalize an understanding and resolution of current interpersonal problems. Target Date: 2023-04-14 Frequency: Daily Modality: individual Progress: 90%  Related Problem: Recognize, accept, and cope with feelings of depression. Description: Verbalize insight into how past relationships may be influencing current experiences with depression. Target Date:  2023-04-14 Frequency: Daily Modality: individual Progress: 80%  Client Response full compliance  Service Location Location, [address] [place], [place] [address]  Service Code cpt [phone]  Normalize/Reframe  Facilitate problem solving  Identify/label emotions  Validate/empathize  Emotion regulation skills  Self care activities  Lifestyle change (exercise, nutrition)  Self-monitoring  Identified an insight  Rationally challenge thoughts or beliefs/cognitive restructuring  Session notes:  F33.2  Goals/Plan: Wants to work on being genuine and being satisfied with herself and develop stronger self-esteem. Also, would like to improve her primary relationship and have her interpersonal and romantic needs met. This goal is now met, as she had ended the relationship. Wants to continue self-care and maintain weight loss. Needs to develop strategy to manage relationship with her son Sharon Cole, who struggles with mental health issues. Goal date 12-24. 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 Will also attempt to create a small, but satisfying social network with like-minded people. Goal date is 12-24.   Meds:Citalopram (20mg ), Buspar, Abilify 5mg  , Remeron Patient agrees to video Caregility session and is aware of the limitations of this platform. She is at home and I am at my home office.   [name] said she has been meeting all of her deadlines at school so far. She is, however, going to request that she have her hours cut at work. She has an A in one class and a B in the other. She feels there is not much more to her  life than work and school. She is starting to work on some "outside activities" like watching TV as a means of relaxing. Her diet has been poor. She mostly takes out from restaurants. We talked about some alternatives that she can do that will be healthier and more affordable.  Says husband Ron may lose his job, which means that Sharon Cole will lose  his financial support. Suggested she tell [name] so he isn't surprised.                                                                                                                                                                                                                                                                              [name], PhD  Time: 4:10p-5:00p 50 minutes.

## 2023-02-04 ENCOUNTER — Ambulatory Visit: Payer: Medicare Other | Admitting: Psychology

## 2023-02-04 DIAGNOSIS — F332 Major depressive disorder, recurrent severe without psychotic features: Secondary | ICD-10-CM

## 2023-02-04 DIAGNOSIS — F331 Major depressive disorder, recurrent, moderate: Secondary | ICD-10-CM

## 2023-02-04 NOTE — Progress Notes (Signed)
Sharon Cole is a 67 y.o. female patient   02/04/2023  Treatment Plan: Diagnosis 296.32 (Major depressive affective disorder, recurrent episode, moderate) [n/a]  300.02 (Generalized anxiety disorder) [n/a]  Symptoms Depressed or irritable mood. (Status: maintained) -- No Description Entered  Feelings of hopelessness, worthlessness, or inappropriate guilt. (Status: maintained) -- No Description Entered  Lack of energy. (Status: maintained) -- No Description Entered  Low self-esteem. (Status: maintained) -- No Description Entered  Medication Status compliance  Safety none  If Suicidal or Homicidal State Action Taken: unspecified  Current Risk: low Medications Abilify (Dosage: .25mg )  Buspar (Dosage: 30mg )  Citalopram (Dosage: 20mg )  Topomax (Dosage: unknown)  Objectives Related Problem: Recognize, accept, and cope with feelings of depression. Description: Identify and replace thoughts and beliefs that support depression. Target Date: 2023-04-14 Frequency: Daily Modality: individual Progress: 80%  Related Problem: Recognize, accept, and cope with feelings of depression. Description: Learn and implement behavioral strategies to overcome depression. Target Date: 2023-04-14 Frequency: Daily Modality: individual Progress: 70%  Related Problem: Recognize, accept, and cope with feelings of depression. Description: Verbalize an understanding and resolution of current interpersonal problems. Target Date: 2023-04-14 Frequency: Daily Modality: individual Progress: 90%  Related Problem: Recognize, accept, and cope with feelings of depression. Description: Verbalize insight into how past relationships may be influencing current experiences with  depression. Target Date: 2023-04-14 Frequency: Daily Modality: individual Progress: 80%  Client Response full compliance  Service Location Location, [address] [place], [place] [address]  Service Code cpt [phone]  Normalize/Reframe  Facilitate problem solving  Identify/label emotions  Validate/empathize  Emotion regulation skills  Self care activities  Lifestyle change (exercise, nutrition)  Self-monitoring  Identified an insight  Rationally challenge thoughts or beliefs/cognitive restructuring  Session notes:  F33.2  Goals/Plan: Wants to work on being genuine and being satisfied with herself and develop stronger self-esteem. Also, would like to improve her primary relationship and have her interpersonal and romantic needs met. This goal is now met, as she had ended the relationship. Wants to continue self-care and maintain weight loss. Needs to develop strategy to manage relationship with her son Sharon Hua, who struggles with mental health issues. Goal date 12-24. 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 Will also attempt to create a small, but satisfying social network with like-minded people. Goal date is 12-24.   Meds:Citalopram (20mg ), Buspar, Abilify 5mg  , Remeron Patient agrees to video Caregility session and is aware of the limitations of this platform. She is at home and I am at my home office.   Sharon Cole has been doing well keeping up with her studies, but she did receive a bad grade on one of her assignments. She has requested to reduce her work schedule by one day and hasn't  heard back.  Reports that her use of CBD/THC is greatly reduced and is not medicinal and is only recreational. She says she misses getting high. Sharon Cole is anxious about calling son Sharon Cole because it is so "draining" and very negative. She is likely to text him to share with him that Sharon Cole may lose his job, and therefore not be able to keep sending money. She agrees to  reach out to him so that she does not carry around the anxiety.                                                                                                                                                                                                                                                                                  [name], PhD  Time: 1:15p-2:00p 45 minutes.

## 2023-02-05 ENCOUNTER — Ambulatory Visit (HOSPITAL_COMMUNITY): Payer: Medicare Other | Admitting: Student

## 2023-02-11 ENCOUNTER — Ambulatory Visit (INDEPENDENT_AMBULATORY_CARE_PROVIDER_SITE_OTHER): Payer: Medicare Other | Admitting: Psychology

## 2023-02-11 DIAGNOSIS — F331 Major depressive disorder, recurrent, moderate: Secondary | ICD-10-CM

## 2023-02-11 DIAGNOSIS — F332 Major depressive disorder, recurrent severe without psychotic features: Secondary | ICD-10-CM

## 2023-02-11 NOTE — Progress Notes (Signed)
Sharon Cole is a 67 y.o. female patient   02/11/2023  Treatment Plan: Diagnosis 296.32 (Major depressive affective disorder, recurrent episode, moderate) [n/a]  300.02 (Generalized anxiety disorder) [n/a]  Symptoms Depressed or irritable mood. (Status: maintained) -- No Description Entered  Feelings of hopelessness, worthlessness, or inappropriate guilt. (Status: maintained) -- No Description Entered  Lack of energy. (Status: maintained) -- No Description Entered  Low self-esteem. (Status: maintained) -- No Description Entered  Medication Status compliance  Safety none  If Suicidal or Homicidal State Action Taken: unspecified  Current Risk: low Medications Abilify (Dosage: .25mg )  Buspar (Dosage: 30mg )  Citalopram (Dosage: 20mg )  Topomax (Dosage: unknown)  Objectives Related Problem: Recognize, accept, and cope with feelings of depression. Description: Identify and replace thoughts and beliefs that support depression. Target Date: 2023-04-14 Frequency: Daily Modality: individual Progress: 80%  Related Problem: Recognize, accept, and cope with feelings of depression. Description: Learn and implement behavioral strategies to overcome depression. Target Date: 2023-04-14 Frequency: Daily Modality: individual Progress: 70%  Related Problem: Recognize, accept, and cope with feelings of depression. Description: Verbalize an understanding and resolution of current interpersonal problems. Target Date: 2023-04-14 Frequency: Daily Modality: individual Progress: 90%  Related Problem: Recognize, accept, and cope with feelings of depression. Description: Verbalize insight into how past relationships may be  influencing current experiences with depression. Target Date: 2023-04-14 Frequency: Daily Modality: individual Progress: 80%  Client Response full compliance  Service Location Location, [address] [place], [place] [address]  Service Code cpt [phone]  Normalize/Reframe  Facilitate problem solving  Identify/label emotions  Validate/empathize  Emotion regulation skills  Self care activities  Lifestyle change (exercise, nutrition)  Self-monitoring  Identified an insight  Rationally challenge thoughts or beliefs/cognitive restructuring  Session notes:  F33.2  Goals/Plan: Wants to work on being genuine and being satisfied with herself and develop stronger self-esteem. Also, would like to improve her primary relationship and have her interpersonal and romantic needs met. This goal is now met, as she had ended the relationship. Wants to continue self-care and maintain weight loss. Needs to develop strategy to manage relationship with her son Sharon Hua, who struggles with mental health issues. Goal date 12-24. 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 Will also attempt to create a small, but satisfying social network with like-minded people. Goal date is 12-24.   Meds:Citalopram (20mg ), Buspar, Abilify 5mg  , Remeron Patient agrees to video Caregility session and is aware of the limitations of this platform. She is at home and I am at my home office.   Sharon Cole says that her boss would not approve 1 day off but will allow her to go part-time. She will  not get benefits, but she is fine with it. Says it has been a crazy busy week with work and school. She is frustrated in that her eating is out of control and it is putting on weight. Says she has "no clue" with how to get control of the eating. We talked about integrating exercise (walking) into her schedule. She feels that is possible in that she did it in the past. Will work to integrate into schedule this  week.               [name], PhD  Time: 4:15p-5:00p 45 minutes.

## 2023-02-18 ENCOUNTER — Ambulatory Visit: Payer: Medicare Other | Admitting: Psychology

## 2023-02-18 DIAGNOSIS — F331 Major depressive disorder, recurrent, moderate: Secondary | ICD-10-CM | POA: Diagnosis not present

## 2023-02-18 DIAGNOSIS — F411 Generalized anxiety disorder: Secondary | ICD-10-CM

## 2023-02-18 NOTE — Progress Notes (Addendum)
Sharon Cole is a 67 y.o. female patient    Treatment Plan: Diagnosis 296.32 (Major depressive affective disorder, recurrent episode, moderate) [n/a]  300.02 (Generalized anxiety disorder) [n/a]  Symptoms Depressed or irritable mood. (Status: maintained) -- No Description Entered  Feelings of hopelessness, worthlessness, or inappropriate guilt. (Status: maintained) -- No Description Entered  Lack of energy. (Status: maintained) -- No Description Entered  Low self-esteem. (Status: maintained) -- No Description Entered  Medication Status compliance  Safety none  If Suicidal or Homicidal State Action Taken: unspecified  Current Risk: low Medications Abilify (Dosage: .25mg )  Buspar (Dosage: 30mg )  Citalopram (Dosage: 20mg )  Topomax (Dosage: unknown)  Objectives Related Problem: Recognize, accept, and cope with feelings of depression. Description: Identify and replace thoughts and beliefs that support depression. Target Date: 2023-04-14 Frequency: Daily Modality: individual Progress: 80%  Related Problem: Recognize, accept, and cope with feelings of depression. Description: Learn and implement behavioral strategies to overcome depression. Target Date: 2023-04-14 Frequency: Daily Modality: individual Progress: 70%  Related Problem: Recognize, accept, and cope with feelings of depression. Description: Verbalize an understanding and resolution of current interpersonal problems. Target Date: 2023-04-14 Frequency: Daily Modality: individual Progress: 90%  Related Problem: Recognize, accept, and cope with feelings of depression. Description: Verbalize insight into how past  relationships may be influencing current experiences with depression. Target Date: 2023-04-14 Frequency: Daily Modality: individual Progress: 80%  Client Response full compliance  Service Location Location, [address] [place], [place] [address]  Service Code cpt [phone]  Normalize/Reframe  Facilitate problem solving  Identify/label emotions  Validate/empathize  Emotion regulation skills  Self care activities  Lifestyle change (exercise, nutrition)  Self-monitoring  Identified an insight  Rationally challenge thoughts or beliefs/cognitive restructuring  Session notes:    Goals/Plan: Wants to work on being genuine and being satisfied with herself and develop stronger self-esteem. Also, would like to improve her primary relationship and have her interpersonal and romantic needs met. This goal is now met, as she had ended the relationship. Wants to continue self-care and maintain weight loss. Needs to develop strategy to manage relationship with her son Sharon Hua, who struggles with mental health issues. Goal date 12-24. 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 Will also attempt to create a small, but satisfying social network with like-minded people. Goal date is 12-24.   Meds:Citalopram (20mg ), Buspar, Abilify 5mg  , Remeron Patient agrees to video Caregility session and is aware of the limitations of this platform. She is at home and I am at my home office.   Sharon Cole says she is doing well  and keeping up at school. She is on a part time schedule, which is about 30 hours/week. She states that she is getting back to depending on weed too often. She wants to reduce her use of weed. Talked about strategy and challenges. States that her ex-husband called her to share his communication with their son, telling him he needs to reach out or he will not get another check. This has the potential to be a crisis in which [name] can get pulled in. We talked about the  need for her to not allow that to happen as it will greatly increase her stress.                  [name], PhD  Time: 1:10p-2:00p 50 minutes.

## 2023-02-25 ENCOUNTER — Ambulatory Visit (INDEPENDENT_AMBULATORY_CARE_PROVIDER_SITE_OTHER): Payer: Medicare Other | Admitting: Psychology

## 2023-02-25 DIAGNOSIS — F411 Generalized anxiety disorder: Secondary | ICD-10-CM | POA: Diagnosis not present

## 2023-02-25 DIAGNOSIS — F331 Major depressive disorder, recurrent, moderate: Secondary | ICD-10-CM

## 2023-02-25 DIAGNOSIS — F332 Major depressive disorder, recurrent severe without psychotic features: Secondary | ICD-10-CM | POA: Diagnosis not present

## 2023-02-25 NOTE — Progress Notes (Signed)
Sharon Cole is a 67 y.o. female patient   02/25/2023  Treatment Plan: Diagnosis 296.32 (Major depressive affective disorder, recurrent episode, moderate) [n/a]  300.02 (Generalized anxiety disorder) [n/a]  Symptoms Depressed or irritable mood. (Status: maintained) -- No Description Entered  Feelings of hopelessness, worthlessness, or inappropriate guilt. (Status: maintained) -- No Description Entered  Lack of energy. (Status: maintained) -- No Description Entered  Low self-esteem. (Status: maintained) -- No Description Entered  Medication Status compliance  Safety none  If Suicidal or Homicidal State Action Taken: unspecified  Current Risk: low Medications Abilify (Dosage: .25mg )  Buspar (Dosage: 30mg )  Citalopram (Dosage: 20mg )  Topomax (Dosage: unknown)  Objectives Related Problem: Recognize, accept, and cope with feelings of depression. Description: Identify and replace thoughts and beliefs that support depression. Target Date: 2023-04-14 Frequency: Daily Modality: individual Progress: 80%  Related Problem: Recognize, accept, and cope with feelings of depression. Description: Learn and implement behavioral strategies to overcome depression. Target Date: 2023-04-14 Frequency: Daily Modality: individual Progress: 70%  Related Problem: Recognize, accept, and cope with feelings of depression. Description: Verbalize an understanding and resolution of current interpersonal problems. Target Date: 2023-04-14 Frequency: Daily Modality: individual Progress: 90%  Related Problem: Recognize, accept, and cope with feelings of depression. Description:  Verbalize insight into how past relationships may be influencing current experiences with depression. Target Date: 2023-04-14 Frequency: Daily Modality: individual Progress: 80%  Client Response full compliance  Service Location Location, [address] [place], [place] [address]  Service Code cpt [phone]  Normalize/Reframe  Facilitate problem solving  Identify/label emotions  Validate/empathize  Emotion regulation skills  Self care activities  Lifestyle change (exercise, nutrition)  Self-monitoring  Identified an insight  Rationally challenge thoughts or beliefs/cognitive restructuring  Session notes:  F33.2  Goals/Plan: Wants to work on being genuine and being satisfied with herself and develop stronger self-esteem. Also, would like to improve her primary relationship and have her interpersonal and romantic needs met. This goal is now met, as she had ended the relationship. Wants to continue self-care and maintain weight loss. Needs to develop strategy to manage relationship with her son Sharon Hua, who struggles with mental health issues. Goal date 12-24. 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 Will also attempt to create a small, but satisfying social network with like-minded people. Goal date is 12-24.   Meds:Citalopram (20mg ), Buspar, Abilify 5mg  , Remeron Patient agrees to video Caregility session and is aware of the limitations of this platform. She is at  home and I am at my home office.   Sharon Cole states that she is still keeping up with school, but is not managing her time well. Even though she has more "free time", she is not using it well. She is also not planning well in that she is not looking ahead to assignments. It is part of her avoidance. She is having some questions about her ability to manage both school and work. Her experience with class is varied, as she has had one that is rigid and another much moore "loose" with rules.She has  been trying to set up a regular time to see her grandchildren on Sundays. Sharon Cole's mother will be locating to this area. May be challenging in that she is a very demanding woman. Sharon Cole is not cerned, as she anticipates being very involved with school/work.                            [name], PhD  Time: 4:10p-5:00p 50 minutes.

## 2023-03-04 ENCOUNTER — Ambulatory Visit: Payer: Medicare Other | Admitting: Psychology

## 2023-03-04 DIAGNOSIS — F411 Generalized anxiety disorder: Secondary | ICD-10-CM

## 2023-03-04 DIAGNOSIS — F331 Major depressive disorder, recurrent, moderate: Secondary | ICD-10-CM | POA: Diagnosis not present

## 2023-03-04 NOTE — Progress Notes (Addendum)
Sharon Cole is a 67 y.o. female patient   03/04/2023  Treatment Plan: Diagnosis 296.32 (Major depressive affective disorder, recurrent episode, moderate) [n/a]  300.02 (Generalized anxiety disorder) [n/a]  Symptoms Depressed or irritable mood. (Status: maintained) -- No Description Entered  Feelings of hopelessness, worthlessness, or inappropriate guilt. (Status: maintained) -- No Description Entered  Lack of energy. (Status: maintained) -- No Description Entered  Low self-esteem. (Status: maintained) -- No Description Entered  Medication Status compliance  Safety none  If Suicidal or Homicidal State Action Taken: unspecified  Current Risk: low Medications Abilify (Dosage: .25mg )  Buspar (Dosage: 30mg )  Citalopram (Dosage: 20mg )  Topomax (Dosage: unknown)  Objectives Related Problem: Recognize, accept, and cope with feelings of depression. Description: Identify and replace thoughts and beliefs that support depression. Target Date: 2023-04-14 Frequency: Daily Modality: individual Progress: 80%  Related Problem: Recognize, accept, and cope with feelings of depression. Description: Learn and implement behavioral strategies to overcome depression. Target Date: 2023-04-14 Frequency: Daily Modality: individual Progress: 70%  Related Problem: Recognize, accept, and cope with feelings of depression. Description: Verbalize an understanding and resolution of current interpersonal problems. Target Date: 2023-04-14 Frequency: Daily Modality: individual Progress: 90%  Related Problem: Recognize, accept, and cope with  feelings of depression. Description: Verbalize insight into how past relationships may be influencing current experiences with depression. Target Date: 2023-04-14 Frequency: Daily Modality: individual Progress: 80%  Client Response full compliance  Service Location Location, [address] [place], [place] [address]  Service Code cpt [phone]  Normalize/Reframe  Facilitate problem solving  Identify/label emotions  Validate/empathize  Emotion regulation skills  Self care activities  Lifestyle change (exercise, nutrition)  Self-monitoring  Identified an insight  Rationally challenge thoughts or beliefs/cognitive restructuring  Session notes:   Goals/Plan: Wants to work on being genuine and being satisfied with herself and develop stronger self-esteem. Also, would like to improve her primary relationship and have her interpersonal and romantic needs met. This goal is now met, as she had ended the relationship. Wants to continue self-care and maintain weight loss. Needs to develop strategy to manage relationship with her son Sharon Hua, who struggles with mental health issues. Goal date 12-24. 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 Will also attempt to create a small, but satisfying social network with like-minded people. Goal date is 12-24.   Meds:Citalopram (20mg ), Buspar, Abilify 5mg  , Remeron Patient agrees to video  Caregility session and is aware of the limitations of this platform. She is at home and I am at my home office.   [name] is feeling overwhelmed and disappointed with school. It is harder and more time consuming than expected. She says this type of platform is not good for her learning style. She is also feeling terrible about herself because of her weight. At work, now that she is part time, she feels somewhat out of the loop. She states she plans to stick with school. "It is going to be challenging, but I can do it". We discussed how the  next quarter will be different with regard to her time management. She says that she hasn't looked at food options that can be ordered and healthy. Will check on that asap. Organization is going to be key to her success for the next trimester and we talked about how to best plan.                                  [name], PhD  Time: 1:10p-2:00p 50 minutes.

## 2023-03-11 ENCOUNTER — Ambulatory Visit: Payer: Medicare Other | Admitting: Psychology

## 2023-03-18 ENCOUNTER — Ambulatory Visit: Payer: Medicare Other | Admitting: Psychology

## 2023-03-18 DIAGNOSIS — F411 Generalized anxiety disorder: Secondary | ICD-10-CM | POA: Diagnosis not present

## 2023-03-18 DIAGNOSIS — F331 Major depressive disorder, recurrent, moderate: Secondary | ICD-10-CM | POA: Diagnosis not present

## 2023-03-18 NOTE — Progress Notes (Signed)
Sharon Cole is a 67 y.o. female patient   03/18/2023  Treatment Plan: Diagnosis 296.32 (Major depressive affective disorder, recurrent episode, moderate) [n/a]  300.02 (Generalized anxiety disorder) [n/a]  Symptoms Depressed or irritable mood. (Status: maintained) -- No Description Entered  Feelings of hopelessness, worthlessness, or inappropriate guilt. (Status: maintained) -- No Description Entered  Lack of energy. (Status: maintained) -- No Description Entered  Low self-esteem. (Status: maintained) -- No Description Entered  Medication Status compliance  Safety none  If Suicidal or Homicidal State Action Taken: unspecified  Current Risk: low Medications Abilify (Dosage: .25mg )  Buspar (Dosage: 30mg )  Citalopram (Dosage: 20mg )  Topomax (Dosage: unknown)  Objectives Related Problem: Recognize, accept, and cope with feelings of depression. Description: Identify and replace thoughts and beliefs that support depression. Target Date: 2023-04-14 Frequency: Daily Modality: individual Progress: 80%  Related Problem: Recognize, accept, and cope with feelings of depression. Description: Learn and implement behavioral strategies to overcome depression. Target Date: 2023-04-14 Frequency: Daily Modality: individual Progress: 70%  Related Problem: Recognize, accept, and cope with feelings of depression. Description: Verbalize an understanding and resolution of current interpersonal problems. Target Date: 2023-04-14 Frequency: Daily Modality: individual Progress: 90%  Related Problem: Recognize, accept, and cope with  feelings of depression. Description: Verbalize insight into how past relationships may be influencing current experiences with depression. Target Date: 2023-04-14 Frequency: Daily Modality: individual Progress: 80%  Client Response full compliance  Service Location Location, [address] [place], [place] [address]  Service Code cpt [phone]  Normalize/Reframe  Facilitate problem solving  Identify/label emotions  Validate/empathize  Emotion regulation skills  Self care activities  Lifestyle change (exercise, nutrition)  Self-monitoring  Identified an insight  Rationally challenge thoughts or beliefs/cognitive restructuring  Session notes:   Goals/Plan: Wants to work on being genuine and being satisfied with herself and develop stronger self-esteem. Also, would like to improve her primary relationship and have her interpersonal and romantic needs met. This goal is now met, as she had ended the relationship. Wants to continue self-care and maintain weight loss. Needs to develop strategy to manage relationship with her son [name], who struggles with mental health issues. Goal date 12-24. 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 Will also attempt to create a small, but satisfying social network with like-minded people. Goal date is 12-24.   Meds:Citalopram (20mg ), Buspar, Abilify 5mg  , Remeron Patient agrees to video  Caregility session and is aware of the limitations of this platform. She is at home and I am at my home office.   [name] finished her first quarter at school. She got an A and a B.She will have 3 courses next quarter. She is still doing gummies daily, but less than before. She talked about her social isolation and her tendency to stay withdrawn from others. She does think her poor self-image is a factor that keeps her socially inhibited. We talked about how to use her 2 week break with a focus on self-care. She will spend time with her  grandchildren and her sons. Needs to make efforts to address her self-esteem and confidence.                                       [name], PhD  Time: 1:10p-2:00p 50 minutes.                                                                                                                                                                                                                    Sharon Cole is a 67 y.o. female patient   03/18/2023  Treatment Plan: Diagnosis 296.32 (Major depressive affective disorder, recurrent episode, moderate) [n/a]  300.02 (Generalized anxiety disorder) [n/a]  Symptoms Depressed or irritable mood. (Status: maintained) -- No Description Entered  Feelings of hopelessness, worthlessness, or inappropriate guilt. (Status: maintained) -- No Description Entered  Lack of energy. (Status: maintained) -- No Description Entered  Low self-esteem. (Status: maintained) -- No Description Entered  Medication Status compliance  Safety none  If Suicidal or Homicidal State Action Taken: unspecified  Current Risk: low Medications Abilify (Dosage: .25mg )  Buspar (Dosage: 30mg )  Citalopram (Dosage: 20mg )  Topomax (Dosage: unknown)  Objectives Related Problem: Recognize, accept, and cope with feelings of depression. Description: Identify and replace thoughts and beliefs that support depression. Target Date: 2023-04-14 Frequency: Daily Modality: individual Progress: 80%  Related Problem: Recognize, accept, and cope with feelings of depression. Description: Learn and implement behavioral strategies to overcome depression. Target Date: 2023-04-14 Frequency: Daily Modality: individual Progress: 70%  Related Problem: Recognize, accept, and cope with  feelings of depression. Description: Verbalize an understanding and resolution of current interpersonal problems. Target Date: 2023-04-14 Frequency: Daily Modality: individual Progress: 90%  Related Problem: Recognize, accept, and cope with feelings of depression. Description: Verbalize insight into how past relationships may be influencing current  experiences with depression. Target Date: 2023-04-14 Frequency: Daily Modality: individual Progress: 80%  Client Response full compliance  Service Location Location, [address] [place], [place] [address]  Service Code cpt [phone]  Normalize/Reframe  Facilitate problem solving  Identify/label emotions  Validate/empathize  Emotion regulation skills  Self care activities  Lifestyle change (exercise, nutrition)  Self-monitoring  Identified an insight  Rationally challenge thoughts or beliefs/cognitive restructuring  Session notes:   Goals/Plan: Wants to work on being genuine and being satisfied with herself and develop stronger self-esteem. Also, would like to improve her primary relationship and have her interpersonal and romantic needs met. This goal is now met, as she had ended the relationship. Wants to continue self-care and maintain weight loss. Needs to develop strategy to manage relationship with her son [name], who struggles with mental health issues. Goal date 12-24. 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 Will also attempt to create a small, but satisfying social network with like-minded people. Goal date is 12-24.   Meds:Citalopram (20mg ), Buspar, Abilify 5mg  , Remeron Patient agrees to video Caregility session and is aware of the limitations of this platform. She is at home and I am at my home office.   [name] is feeling overwhelmed and disappointed with school. It is harder and more time consuming than expected. She says this type of platform is not good for her learning style. She  is also feeling terrible about herself because of her weight. At work, now that she is part time, she feels somewhat out of the loop. She states she plans to stick with school. "It is going to be challenging, but I can do it". We discussed how the next quarter will be different with regard to her time management. She says that she hasn't looked at food options that can be ordered and healthy. Will check on that asap. Organization is going to be key to her success for the next trimester and we talked about how to best plan.                                  [name], PhD  Time: 1:10p-2:00p 50 minutes.

## 2023-03-19 ENCOUNTER — Ambulatory Visit (HOSPITAL_BASED_OUTPATIENT_CLINIC_OR_DEPARTMENT_OTHER): Payer: Medicare Other | Admitting: Student

## 2023-03-19 VITALS — BP 120/85 | Ht 65.0 in | Wt 228.0 lb

## 2023-03-19 DIAGNOSIS — F411 Generalized anxiety disorder: Secondary | ICD-10-CM

## 2023-03-19 DIAGNOSIS — F324 Major depressive disorder, single episode, in partial remission: Secondary | ICD-10-CM

## 2023-03-19 DIAGNOSIS — F331 Major depressive disorder, recurrent, moderate: Secondary | ICD-10-CM

## 2023-03-19 DIAGNOSIS — F3341 Major depressive disorder, recurrent, in partial remission: Secondary | ICD-10-CM | POA: Diagnosis not present

## 2023-03-19 DIAGNOSIS — Z79899 Other long term (current) drug therapy: Secondary | ICD-10-CM

## 2023-03-19 MED ORDER — BUSPIRONE HCL 30 MG PO TABS
30.0000 mg | ORAL_TABLET | Freq: Two times a day (BID) | ORAL | 2 refills | Status: DC
Start: 2023-03-19 — End: 2023-06-16

## 2023-03-19 MED ORDER — MIRTAZAPINE 15 MG PO TABS
15.0000 mg | ORAL_TABLET | Freq: Every day | ORAL | 2 refills | Status: DC
Start: 2023-03-19 — End: 2023-06-16

## 2023-03-19 MED ORDER — ARIPIPRAZOLE 5 MG PO TABS
5.0000 mg | ORAL_TABLET | Freq: Every day | ORAL | 2 refills | Status: DC
Start: 2023-03-19 — End: 2023-06-16

## 2023-03-19 MED ORDER — ESCITALOPRAM OXALATE 10 MG PO TABS
10.0000 mg | ORAL_TABLET | Freq: Every day | ORAL | 2 refills | Status: DC
Start: 2023-03-19 — End: 2023-06-16

## 2023-03-20 DIAGNOSIS — Z79899 Other long term (current) drug therapy: Secondary | ICD-10-CM | POA: Insufficient documentation

## 2023-03-20 NOTE — Progress Notes (Signed)
BH MD Outpatient Progress Note  03/20/2023 12:34 PM [facility]  MRN:  [id number]  Assessment:  Sharon Cole presents for follow-up evaluation in-person.  The patient reports doing well since her last appointment 3 months ago.  At that visit Remeron was increased to 15 mg nightly.  The patient reports minimal anxiety and no depression over the past several months.  She is agreeable to reduce her Lexapro from 20 mg daily to 10 mg daily.  This is necessary based on the patient's age.  The patient will continue her other medications as below.  Identifying Information: Sharon Cole is a 66 y.o. y.o. female with a history of generalized anxiety disorder and major depressive disorder, moderate, who is an established patient with [facility] for management of anxiety.   Plan:  # Generalized anxiety disorder  Hx major depressive disorder, moderate Interventions: -- Decrease Lexapro from 20 mg daily to 10 mg daily - Continue Remeron 15 mg nightly - Continue Abilify 5 mg nightly for augmentation of antidepressant medications - Continue buspirone 30 mg twice daily - Continue therapy with Dr. Dellia Cloud  # Cannabis use disorder Interventions: -- Patient restarted using marijuana approximately 1 month ago - Continue to assess willingness to abstain from use  #Long term use of antipsychotic medication -- Lipid panel from October 2023 with LDL of 108 - A1c from October 2023 5.7 - Patient scheduled for lab appointment - Need to obtain an EKG - Patient plans to establish with a new primary care physician, which will make this easier   Patient was given contact information for behavioral health clinic and was instructed to call 911 for emergencies.   Subjective:  Chief Complaint:  Chief Complaint  Patient presents with   Follow-up    Interval History:  The patient reports reducing her hours at work so that she can attend more fully to her schoolwork.   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  She recently finished her academic  quarter and is enjoying her time off.  The patient reports going back to marijuana starting about 1 month ago.  She reports using Gummies or a vape pen.  She says that her use primarily depends on what is scheduled for the day.  She states that she enjoys the feeling of being high and does not wish to stop.  She continues to enjoy spending time with her son and her grandchildren.  The patient reports occasional anhedonia and binge eating.  Otherwise, she denies experiencing any depressed mood.  She reports appropriate sleep, energy, and concentration.  She denies experiencing any hopelessness or suicidal thoughts.  Visit Diagnosis:    ICD-10-CM   1. Moderate episode of recurrent major depressive disorder (HCC)  F33.1 escitalopram (LEXAPRO) 10 MG tablet    ARIPiprazole (ABILIFY) 5 MG tablet    busPIRone (BUSPAR) 30 MG tablet    2. GAD (generalized anxiety disorder)  F41.1 escitalopram (LEXAPRO) 10 MG tablet    ARIPiprazole (ABILIFY) 5 MG tablet    busPIRone (BUSPAR) 30 MG tablet    mirtazapine (REMERON) 15 MG tablet    3. Major depressive disorder in partial remission, unspecified whether recurrent (HCC)  F32.4 mirtazapine (REMERON) 15 MG tablet    4. Long term current use of antipsychotic medication  Z79.899 Lipid Profile    HgB A1c      Past Psychiatric History: The patient denies any history of suicide attempts, no identifiable psychiatric hospitalizations in the medical record  Past  Medical History:  Past Medical History:  Diagnosis Date   Anxiety    Bradycardia    Common migraine with intractable migraine 07/03/2016   Depression    Dizziness    Headache    Menopause    Syncope     Past Surgical History:  Procedure Laterality Date   BACK SURGERY     cyst removal    BREAST SURGERY     breast reduction   BUNIONECTOMY     CHOLECYSTECTOMY     KNEE ARTHROSCOPY      Family Psychiatric History:  None pertinent  Family History:  Family History  Problem Relation Age of Onset   Heart disease Mother    Heart disease Brother    Heart disease Maternal Grandmother    Heart disease Maternal Grandfather    Cancer Son        unknown    Social History:  Social History   Socioeconomic History   Marital status: Significant Other    Spouse name: Not on file   Number of children: 2   Years of education: Masters   Highest education level: Not on file  Occupational History   Not on file  Tobacco Use   Smoking status: Never   Smokeless tobacco: Never  Vaping Use   Vaping status: Never Used  Substance and Sexual Activity   Alcohol use: Yes    Alcohol/week: 1.0 standard drink of alcohol    Types: 1 Glasses of wine per week    Comment: daily   Drug use: No   Sexual activity: Yes    Partners: Male  Other Topics Concern   Not on file  Social History Narrative   Lives   Caffeine use:    Drinks 16oz caffeine drinks a day    Social Determinants of Corporate investment banker Strain: Not on file  Food Insecurity: Not on file  Transportation Needs: Not on file  Physical Activity: Not on file  Stress: Not on file  Social Connections: Not on file    Allergies: No Known Allergies  Current Medications: Current Outpatient Medications  Medication Sig Dispense Refill   escitalopram (LEXAPRO) 10 MG tablet Take 1 tablet (10 mg total) by mouth daily. 30 tablet 2   ARIPiprazole (ABILIFY) 5 MG tablet Take 1 tablet (5 mg total) by mouth daily. 30 tablet 2   busPIRone (BUSPAR) 30 MG tablet Take 1 tablet (30 mg total) by mouth 2 (two) times daily. 60 tablet 2   calcium-vitamin D (OSCAL WITH D) 250-125 MG-UNIT tablet Take 1 tablet by mouth daily.     mirtazapine (REMERON) 15 MG tablet Take 1 tablet (15 mg total) by mouth at bedtime. 30 tablet 2   Multiple Vitamins-Minerals (MULTIVITAMIN PO) Take 1 tablet by mouth daily.     omeprazole (PRILOSEC) 10 MG capsule Take 10 mg by mouth daily.      Rimegepant Sulfate (NURTEC) 75 MG TBDP Take 75 mg by mouth as needed (take 1 at onset of headache, max is 1 tablet in 24 hours). 8 tablet 11   No current facility-administered medications for this visit.     Objective:  Psychiatric Specialty Exam: Physical Exam Constitutional:      Appearance: the patient is not toxic-appearing.  Pulmonary:     Effort: Pulmonary effort is normal.  Neurological:     General: No focal deficit present.     Mental Status: the patient is alert and oriented to person, place, and time.   Review  of Systems  Respiratory:  Negative for shortness of breath.   Cardiovascular:  Negative for chest pain.  Gastrointestinal:  Negative for abdominal pain, constipation, diarrhea, nausea and vomiting.  Neurological:  Negative for headaches.      BP 120/85   Ht 5\' 5"  (1.651 m)   Wt 228 lb (103.4 kg)   BMI 37.94 kg/m   General Appearance: Fairly Groomed  Eye Contact:  Good  Speech:  Clear and Coherent  Volume:  Normal  Mood:  Euthymic  Affect:  Congruent  Thought Process:  Coherent  Orientation:  Full (Time, Place, and Person)  Thought Content: Logical   Suicidal Thoughts:  No  Homicidal Thoughts:  No  Memory:  Immediate;   Good  Judgement:  fair  Insight:  fair  Psychomotor Activity:  Normal  Concentration:  Concentration: Good  Recall:  Good  Fund of Knowledge: Good  Language: Good  Akathisia:  No  Handed:    AIMS (if indicated): not done  Assets:  Communication Skills Desire for Improvement Financial Resources/Insurance Housing Leisure Time Physical Health  ADL's:  Intact  Cognition: WNL  Sleep:  Fair     Metabolic Disorder Labs: Lab Results  Component Value Date   HGBA1C 5.7 (H) 02/05/2022   No results found for: "PROLACTIN" Lab Results  Component Value Date   CHOL 192 02/05/2022   TRIG 98 02/05/2022   HDL 67 02/05/2022   CHOLHDL 2.9 02/05/2022   VLDL 16.1 01/02/2015   LDLCALC 108 (H) 02/05/2022   LDLCALC 96 01/02/2015    Lab Results  Component Value Date   TSH 1.760 02/05/2022   TSH 1.04 01/02/2015    Therapeutic Level Labs: No results found for: "LITHIUM" No results found for: "VALPROATE" No results found for: "CBMZ"  Screenings:  Collaboration of Care: none  A total of 30 minutes was spent involved in face to face clinical care, chart review, documentation.   Carlyn Reichert, MD 03/20/2023, 12:34 PM

## 2023-03-22 NOTE — Addendum Note (Signed)
Addended by: Everlena Cooper on: 03/22/2023 10:29 AM   Modules accepted: Level of Service

## 2023-03-25 ENCOUNTER — Ambulatory Visit (HOSPITAL_BASED_OUTPATIENT_CLINIC_OR_DEPARTMENT_OTHER): Payer: Medicare Other

## 2023-03-25 ENCOUNTER — Ambulatory Visit: Payer: Medicare Other | Admitting: Psychology

## 2023-03-25 DIAGNOSIS — Z79899 Other long term (current) drug therapy: Secondary | ICD-10-CM | POA: Diagnosis not present

## 2023-03-26 LAB — LIPID PANEL
Chol/HDL Ratio: 3.6 ratio (ref 0.0–4.4)
Cholesterol, Total: 198 mg/dL (ref 100–199)
HDL: 55 mg/dL (ref >39)
LDL Chol Calc (NIH): 125 mg/dL — ABNORMAL HIGH (ref 0–99)
Triglycerides: 102 mg/dL (ref 0–149)
VLDL Cholesterol Cal: 18 mg/dL (ref 5–40)

## 2023-03-26 LAB — HEMOGLOBIN A1C
Est. average glucose Bld gHb Est-mCnc: 123 mg/dL
Hgb A1c MFr Bld: 5.9 % — ABNORMAL HIGH (ref 4.8–5.6)

## 2023-04-01 ENCOUNTER — Ambulatory Visit (INDEPENDENT_AMBULATORY_CARE_PROVIDER_SITE_OTHER): Payer: Medicare Other | Admitting: Psychology

## 2023-04-01 DIAGNOSIS — F331 Major depressive disorder, recurrent, moderate: Secondary | ICD-10-CM | POA: Diagnosis not present

## 2023-04-01 DIAGNOSIS — F411 Generalized anxiety disorder: Secondary | ICD-10-CM | POA: Diagnosis not present

## 2023-04-01 NOTE — Progress Notes (Signed)
Sharon Cole is a 67 y.o. female patient   04/01/2023  Treatment Plan: Diagnosis 296.32 (Major depressive affective disorder, recurrent episode, moderate) [n/a]  300.02 (Generalized anxiety disorder) [n/a]  Symptoms Depressed or irritable mood. (Status: maintained) -- No Description Entered  Feelings of hopelessness, worthlessness, or inappropriate guilt. (Status: maintained) -- No Description Entered  Lack of energy. (Status: maintained) -- No Description Entered  Low self-esteem. (Status: maintained) -- No Description Entered  Medication Status compliance  Safety none  If Suicidal or Homicidal State Action Taken: unspecified  Current Risk: low Medications Abilify (Dosage: .25mg )  Buspar (Dosage: 30mg )  Citalopram (Dosage: 20mg )  Topomax (Dosage: unknown)  Objectives Related Problem: Recognize, accept, and cope with feelings of depression. Description: Identify and replace thoughts and beliefs that support depression. Target Date: 2023-04-14 Frequency: Daily Modality: individual Progress: 80%  Related Problem: Recognize, accept, and cope with feelings of depression. Description: Learn and implement behavioral strategies to overcome depression. Target Date: 2023-04-14 Frequency: Daily Modality: individual Progress: 70%  Related Problem: Recognize, accept, and cope with feelings of depression. Description: Verbalize an understanding and resolution of current interpersonal problems. Target Date: 2023-04-14 Frequency: Daily Modality: individual Progress: 90%  Related Problem: Recognize, accept, and cope with feelings of depression. Description: Verbalize insight into how past relationships may be influencing current experiences with depression. Target Date: 2023-04-14 Frequency: Daily Modality:  individual Progress: 80%  Client Response full compliance  Service Location Location, [address] [place], [place] [address]  Service Code cpt [phone]  Normalize/Reframe  Facilitate problem solving  Identify/label emotions  Validate/empathize  Emotion regulation skills  Self care activities  Lifestyle change (exercise, nutrition)  Self-monitoring  Identified an insight  Rationally challenge thoughts or beliefs/cognitive restructuring  Session notes:   Goals/Plan: Wants to work on being genuine and being satisfied with herself and develop stronger self-esteem. Also, would like to improve her primary relationship and have her interpersonal and romantic needs met. This goal is now met, as she had ended the relationship. Wants to continue self-care and maintain weight loss. Needs to develop strategy to manage relationship with her son Sharon Hua, who struggles with mental health issues. Goal date 12-24. 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 Will also attempt to create a small, but satisfying social network with like-minded people. Goal date is 12-24.   Meds:Citalopram (20mg ), Buspar, Abilify 5mg  , Remeron Patient agrees to video Caregility session and is aware of the limitations of this platform. She is at home and I am at my home office.   Sharon Cole has several consecutive days off of work, which is very helpful for school work. She says it is a lot of work with lots of assignments.Going to Sharon Cole's for Thanksgiving. Sharon Cole invited, but not likely to show up or contact anyone. She has had little contact with Sharon Cole and he tends to not answer texts.  School is difficult and very time consuming. She has lost several pounds without changing  anything other than avoiding binge eating. Claims that her moods have been "okay". She is down to 10mg . of Lexapro. Plans to keep it there. She feels than school is uplifting and is glad to have this in her life at this time.                                      [name], PhD  Time: 1:15p-2:00p 45 minutes.

## 2023-04-08 ENCOUNTER — Ambulatory Visit (INDEPENDENT_AMBULATORY_CARE_PROVIDER_SITE_OTHER): Payer: Medicare Other | Admitting: Psychology

## 2023-04-08 DIAGNOSIS — F411 Generalized anxiety disorder: Secondary | ICD-10-CM

## 2023-04-08 DIAGNOSIS — F331 Major depressive disorder, recurrent, moderate: Secondary | ICD-10-CM | POA: Diagnosis not present

## 2023-04-08 NOTE — Progress Notes (Signed)
Sharon Cole is a 67 y.o. female patient   04/08/2023  Treatment Plan: Diagnosis 296.32 (Major depressive affective disorder, recurrent episode, moderate) [n/a]  300.02 (Generalized anxiety disorder) [n/a]  Symptoms Depressed or irritable mood. (Status: maintained) -- No Description Entered  Feelings of hopelessness, worthlessness, or inappropriate guilt. (Status: maintained) -- No Description Entered  Lack of energy. (Status: maintained) -- No Description Entered  Low self-esteem. (Status: maintained) -- No Description Entered  Medication Status compliance  Safety none  If Suicidal or Homicidal State Action Taken: unspecified  Current Risk: low Medications Abilify (Dosage: .25mg )  Buspar (Dosage: 30mg )  Citalopram (Dosage: 20mg )  Topomax (Dosage: unknown)  Objectives Related Problem: Recognize, accept, and cope with feelings of depression. Description: Identify and replace thoughts and beliefs that support depression. Target Date: 2023-04-14 Frequency: Daily Modality: individual Progress: 80%  Related Problem: Recognize, accept, and cope with feelings of depression. Description: Learn and implement behavioral strategies to overcome depression. Target Date: 2023-04-14 Frequency: Daily Modality: individual Progress: 70%  Related Problem: Recognize, accept, and cope with feelings of depression. Description: Verbalize an understanding and resolution of current interpersonal problems. Target Date: 2023-04-14 Frequency: Daily Modality: individual Progress: 90%  Related Problem: Recognize, accept, and cope with feelings of depression. Description: Verbalize insight into how past relationships may be influencing current experiences with depression. Target Date:  2023-04-14 Frequency: Daily Modality: individual Progress: 80%  Client Response full compliance  Service Location Location, [address] [place], [place] [address]  Service Code cpt [phone]  Normalize/Reframe  Facilitate problem solving  Identify/label emotions  Validate/empathize  Emotion regulation skills  Self care activities  Lifestyle change (exercise, nutrition)  Self-monitoring  Identified an insight  Rationally challenge thoughts or beliefs/cognitive restructuring  Session notes:   Goals/Plan: Wants to work on being genuine and being satisfied with herself and develop stronger self-esteem. Also, would like to improve her primary relationship and have her interpersonal and romantic needs met. This goal is now met, as she had ended the relationship. Wants to continue self-care and maintain weight loss. Needs to develop strategy to manage relationship with her son Sharon Cole, who struggles with mental health issues. Goal date 12-25. 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 Will also attempt to create a small, but satisfying social network with like-minded people. Goal date is 12-25.   Meds:Citalopram (20mg ), Buspar, Abilify 5mg  , Remeron Patient agrees to video Caregility session and is aware of the limitations of this platform. She is at home and I am at my home office.   [name] that her classes are just starting up and is already "brutal" (in terms of work). She says she actually feels good about it and says she is happy for the irst time in a long while. She feels she is accomplishing something worth while and toward a goal. It is difficult financially, but she  is making it work. She has spent a little more time with her grandchildren lately. This has been very gratifying. Sharon Cole showed up for Thanksgiving and it went well. The next day he asked [name] for money. She lent him another $1500, which was a hardship for her. We discussed having a  very necessary discussion with [name] about money and his future plan to generate income. This will be difficult for [name], but she is willing to do it.                                       [name], PhD  Time: 4:15p-5:00p 45 minutes.

## 2023-04-15 ENCOUNTER — Ambulatory Visit: Payer: Medicare Other | Admitting: Psychology

## 2023-04-22 ENCOUNTER — Ambulatory Visit: Payer: Medicare Other | Admitting: Psychology

## 2023-04-22 DIAGNOSIS — F411 Generalized anxiety disorder: Secondary | ICD-10-CM

## 2023-04-22 DIAGNOSIS — F331 Major depressive disorder, recurrent, moderate: Secondary | ICD-10-CM | POA: Diagnosis not present

## 2023-04-22 NOTE — Progress Notes (Signed)
Sharon Cole is a 67 y.o. female patient   04/22/2023  Treatment Plan: Diagnosis 296.32 (Major depressive affective disorder, recurrent episode, moderate) [n/a]  300.02 (Generalized anxiety disorder) [n/a]  Symptoms Depressed or irritable mood. (Status: maintained) -- No Description Entered  Feelings of hopelessness, worthlessness, or inappropriate guilt. (Status: maintained) -- No Description Entered  Lack of energy. (Status: maintained) -- No Description Entered  Low self-esteem. (Status: maintained) -- No Description Entered  Medication Status compliance  Safety none  If Suicidal or Homicidal State Action Taken: unspecified  Current Risk: low Medications Abilify (Dosage: .25mg )  Buspar (Dosage: 30mg )  Citalopram (Dosage: 20mg )  Topomax (Dosage: unknown)  Objectives Related Problem: Recognize, accept, and cope with feelings of depression. Description: Identify and replace thoughts and beliefs that support depression. Target Date: 2024-04-13 Frequency: Daily Modality: individual Progress: 80%  Related Problem: Recognize, accept, and cope with feelings of depression. Description: Learn and implement behavioral strategies to overcome depression. Target Date: 2024-04-13 Frequency: Daily Modality: individual Progress: 75%  Related Problem: Recognize, accept, and cope with feelings of depression. Description: Verbalize an understanding and resolution of current interpersonal problems. Target Date: 2024-04-13 Frequency: Daily Modality: individual Progress: 90%  Related Problem: Recognize, accept, and cope with feelings of depression. Description: Verbalize insight into how past relationships may be influencing current experiences with  depression. Target Date: 2024-04-13 Frequency: Daily Modality: individual Progress: 90%  Client Response full compliance  Service Location Location, [address] [place], [place] [address]  Service Code cpt [phone]  Normalize/Reframe  Facilitate problem solving  Identify/label emotions  Validate/empathize  Emotion regulation skills  Self care activities  Lifestyle change (exercise, nutrition)  Self-monitoring  Identified an insight  Rationally challenge thoughts or beliefs/cognitive restructuring  Session notes:   Goals/Plan: Wants to work on being genuine and being satisfied with herself and develop stronger self-esteem. Also, would like to improve her primary relationship and have her interpersonal and romantic needs met. This goal is now met, as she had ended the relationship. Wants to continue self-care and maintain weight loss. Needs to develop strategy to manage relationship with her son [name], who struggles with mental health issues. Goal date 12-25. 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 Will also attempt to create a small, but satisfying social network with like-minded people. Goal date is 12-25.   Meds:Citalopram (20mg ), Buspar, Abilify 5mg  , Remeron Patient agrees to video Caregility session and is aware of the limitations of this platform. She is at home and I am at my home office.   [name] that she is working hard with school. She had a role play that was difficult for her (on-line). She was very nervous even though it was a pass fail class. Her ex-husband Sharon Cole is coming to town after Christmas.  This will be complicated, but he has already he "doesn't want to see" any Pierro's. Dicussed how she will juggle the holiday week and "stay in her own lane".                                           [name], PhD  Time: 4:15p-5:00p 45 minutes.

## 2023-04-29 ENCOUNTER — Ambulatory Visit: Payer: Medicare Other | Admitting: Psychology

## 2023-05-06 ENCOUNTER — Ambulatory Visit: Payer: Medicare Other | Admitting: Psychology

## 2023-05-06 DIAGNOSIS — F411 Generalized anxiety disorder: Secondary | ICD-10-CM

## 2023-05-06 DIAGNOSIS — F331 Major depressive disorder, recurrent, moderate: Secondary | ICD-10-CM | POA: Diagnosis not present

## 2023-05-06 NOTE — Progress Notes (Signed)
Sharon Cole is a 67 y.o. female patient   05/06/2023  Treatment Plan: Diagnosis 296.32 (Major depressive affective disorder, recurrent episode, moderate) [n/a]  300.02 (Generalized anxiety disorder) [n/a]  Symptoms Depressed or irritable mood. (Status: maintained) -- No Description Entered  Feelings of hopelessness, worthlessness, or inappropriate guilt. (Status: maintained) -- No Description Entered  Lack of energy. (Status: maintained) -- No Description Entered  Low self-esteem. (Status: maintained) -- No Description Entered  Medication Status compliance  Safety none  If Suicidal or Homicidal State Action Taken: unspecified  Current Risk: low Medications Abilify (Dosage: .25mg )  Buspar (Dosage: 30mg )  Citalopram (Dosage: 20mg )  Topomax (Dosage: unknown)  Objectives Related Problem: Recognize, accept, and cope with feelings of depression. Description: Identify and replace thoughts and beliefs that support depression. Target Date: 2024-04-13 Frequency: Daily Modality: individual Progress: 80%  Related Problem: Recognize, accept, and cope with feelings of depression. Description: Learn and implement behavioral strategies to overcome depression. Target Date: 2024-04-13 Frequency: Daily Modality: individual Progress: 75%  Related Problem: Recognize, accept, and cope with feelings of depression. Description: Verbalize an understanding and resolution of current interpersonal problems. Target Date: 2024-04-13 Frequency: Daily Modality: individual Progress: 90%  Related Problem: Recognize, accept, and cope with feelings of depression. Description: Verbalize insight into how past relationships may be influencing  current experiences with depression. Target Date: 2024-04-13 Frequency: Daily Modality: individual Progress: 90%  Client Response full compliance  Service Location Location, [address] [place], [place] [address]  Service Code cpt [phone]  Normalize/Reframe  Facilitate problem solving  Identify/label emotions  Validate/empathize  Emotion regulation skills  Self care activities  Lifestyle change (exercise, nutrition)  Self-monitoring  Identified an insight  Rationally challenge thoughts or beliefs/cognitive restructuring  Session notes:   Goals/Plan: Wants to work on being genuine and being satisfied with herself and develop stronger self-esteem. Also, would like to improve her primary relationship and have her interpersonal and romantic needs met. This goal is now met, as she had ended the relationship. Wants to continue self-care and maintain weight loss. Needs to develop strategy to manage relationship with her son Sharon Hua, who struggles with mental health issues. Goal date 12-25. 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 Will also attempt to create a small, but satisfying social network with like-minded people. Goal date is 12-25.   Meds:Citalopram (20mg ), Buspar, Abilify 5mg  , Remeron Patient agrees to video Caregility session and is aware of the limitations of this platform. She is at home and I am at my home office.   Sharon Cole says she is struggling at school and it is compromising her self-esteem and confidence. We discussed how to avoid being paralyzed by her insecurity and  the need to create a realistic structure. She is concerned about future in program. Discussed ways to create needed structure and trust in herself, which has been a life-long struggle. Things with family are good, but her recent involvement has been minimal.                                            [name], PhD  Time: 4:10p-5:00p 50  minutes.

## 2023-05-13 ENCOUNTER — Ambulatory Visit: Payer: Medicare Other | Admitting: Psychology

## 2023-05-13 DIAGNOSIS — F411 Generalized anxiety disorder: Secondary | ICD-10-CM | POA: Diagnosis not present

## 2023-05-13 DIAGNOSIS — F331 Major depressive disorder, recurrent, moderate: Secondary | ICD-10-CM | POA: Diagnosis not present

## 2023-05-13 NOTE — Progress Notes (Signed)
 MELENI DELAHUNT is a 68 y.o. female patient   05/13/2023  Treatment Plan: Diagnosis 296.32 (Major depressive affective disorder, recurrent episode, moderate) [n/a]  300.02 (Generalized anxiety disorder) [n/a]  Symptoms Depressed or irritable mood. (Status: maintained) -- No Description Entered  Feelings of hopelessness, worthlessness, or inappropriate guilt. (Status: maintained) -- No Description Entered  Lack of energy. (Status: maintained) -- No Description Entered  Low self-esteem. (Status: maintained) -- No Description Entered  Medication Status compliance  Safety none  If Suicidal or Homicidal State Action Taken: unspecified  Current Risk: low Medications Abilify  (Dosage: .25mg )  Buspar  (Dosage: 30mg )  Citalopram (Dosage: 20mg )  Topomax (Dosage: unknown)  Objectives Related Problem: Recognize, accept, and cope with feelings of depression. Description: Identify and replace thoughts and beliefs that support depression. Target Date: 2024-04-13 Frequency: Daily Modality: individual Progress: 80%  Related Problem: Recognize, accept, and cope with feelings of depression. Description: Learn and implement behavioral strategies to overcome depression. Target Date: 2024-04-13 Frequency: Daily Modality: individual Progress: 75%  Related Problem: Recognize, accept, and cope with feelings of depression. Description: Verbalize an understanding and resolution of current interpersonal problems. Target Date: 2024-04-13 Frequency: Daily Modality: individual Progress: 90%  Related Problem: Recognize, accept, and cope with feelings of depression. Description: Verbalize insight into how past  relationships may be influencing current experiences with depression. Target Date: 2024-04-13 Frequency: Daily Modality: individual Progress: 90%  Client Response full compliance  Service Location Location, [address] [place], [place] [address]  Service Code cpt [phone]  Normalize/Reframe  Facilitate problem solving  Identify/label emotions  Validate/empathize  Emotion regulation skills  Self care activities  Lifestyle change (exercise, nutrition)  Self-monitoring  Identified an insight  Rationally challenge thoughts or beliefs/cognitive restructuring  Session notes:   Goals/Plan: Wants to work on being genuine and being satisfied with herself and develop stronger self-esteem. Also, would like to improve her primary relationship and have her interpersonal and romantic needs met. This goal is now met, as she had ended the relationship. Wants to continue self-care and maintain weight loss. Needs to develop strategy to manage relationship with her son [name], who struggles with mental health issues. Goal date 12-25. [name] is now wanting to create a new, fulfilling life and pursue her interest in art. Will also attempt to create a small, but satisfying social network with like-minded people. Goal date is 12-25.   Meds:Citalopram (20mg ), Buspar , Abilify  5mg  , Remeron  Patient agrees to video Caregility session and is aware of the limitations of this platform. She is at home and I am at my home office.   [name] she is struggling with the research class. She is  completely overwhelmed and a a loss about how to proceed. Has lots of pressure to do well (B average). She has a marginally defined plan to resolve and is not able to focus or organize self sufficiently. She has not been doing anything other than school other than go to [employer] for New Year's Eve. We talked about her tendency to binge eat over this situation. That is her typical response. We discussed hiring a tutor to help her  manage. She agrees.                                                CONI [name] [name], PhD  Time: 1:10p-2:00p 50 minutes.

## 2023-05-20 ENCOUNTER — Ambulatory Visit: Payer: Medicare Other | Admitting: Psychology

## 2023-05-20 DIAGNOSIS — F331 Major depressive disorder, recurrent, moderate: Secondary | ICD-10-CM

## 2023-05-20 NOTE — Progress Notes (Signed)
 Sharon Cole is a 68 y.o. female patient   05/20/2023  Treatment Plan: Diagnosis 296.32 (Major depressive affective disorder, recurrent episode, moderate) [n/a]  300.02 (Generalized anxiety disorder) [n/a]  Symptoms Depressed or irritable mood. (Status: maintained) -- No Description Entered  Feelings of hopelessness, worthlessness, or inappropriate guilt. (Status: maintained) -- No Description Entered  Lack of energy. (Status: maintained) -- No Description Entered  Low self-esteem. (Status: maintained) -- No Description Entered  Medication Status compliance  Safety none  If Suicidal or Homicidal State Action Taken: unspecified  Current Risk: low Medications Abilify  (Dosage: .25mg )  Buspar  (Dosage: 30mg )  Citalopram (Dosage: 20mg )  Topomax (Dosage: unknown)  Objectives Related Problem: Recognize, accept, and cope with feelings of depression. Description: Identify and replace thoughts and beliefs that support depression. Target Date: 2024-04-13 Frequency: Daily Modality: individual Progress: 80%  Related Problem: Recognize, accept, and cope with feelings of depression. Description: Learn and implement behavioral strategies to overcome depression. Target Date: 2024-04-13 Frequency: Daily Modality: individual Progress: 75%  Related Problem: Recognize, accept, and cope with feelings of depression. Description: Verbalize an understanding and resolution of current interpersonal problems. Target Date: 2024-04-13 Frequency: Daily Modality: individual Progress: 90%  Related Problem: Recognize, accept, and cope with feelings of depression. Description: Verbalize  insight into how past relationships may be influencing current experiences with depression. Target Date: 2024-04-13 Frequency: Daily Modality: individual Progress: 90%  Client Response full compliance  Service Location Location, [address] [place], [place] [address]  Service Code cpt [phone]  Normalize/Reframe  Facilitate problem solving  Identify/label emotions  Validate/empathize  Emotion regulation skills  Self care activities  Lifestyle change (exercise, nutrition)  Self-monitoring  Identified an insight  Rationally challenge thoughts or beliefs/cognitive restructuring  Session notes:   Goals/Plan: Wants to work on being genuine and being satisfied with herself and develop stronger self-esteem. Also, would like to improve her primary relationship and have her interpersonal and romantic needs met. This goal is now met, as she had ended the relationship. Wants to continue self-care and maintain weight loss. Needs to develop strategy to manage relationship with her son Sharon Cole, who struggles with mental health issues. Goal date 12-25. 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 Will also attempt to create a small, but satisfying social network with like-minded people. Goal date is 12-25.   Meds:Citalopram (20mg ), Buspar , Abilify  5mg  , Remeron  Patient agrees to video Caregility session and is aware of the limitations of this platform. She is at home and I am at my  home office.   Sharon Cole says she is still under great pressure at school and told she cannot drop the class. She spoke with professor and requested a engineer, technical sales. Was told it is not that difficult and you can do it. Her confidence is shaken and she is doubting herself. She says I am not engaged and is behind on everything. We discussed how to remain as positive as possible and to develop a perspective that she can tolerate the worst case scenario. Developed a strategy for the rest of the  week and the last three assignments for the class.                                            CONI Sharon Cole [name], PhD  Time: 4:15p-5:00p 45 minutes.

## 2023-05-27 ENCOUNTER — Ambulatory Visit: Payer: Medicare Other | Admitting: Psychology

## 2023-05-27 DIAGNOSIS — F331 Major depressive disorder, recurrent, moderate: Secondary | ICD-10-CM | POA: Diagnosis not present

## 2023-05-27 DIAGNOSIS — F411 Generalized anxiety disorder: Secondary | ICD-10-CM
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
## 2023-06-03 ENCOUNTER — Ambulatory Visit (INDEPENDENT_AMBULATORY_CARE_PROVIDER_SITE_OTHER): Payer: Medicare Other | Admitting: Psychology

## 2023-06-03 DIAGNOSIS — F331 Major depressive disorder, recurrent, moderate: Secondary | ICD-10-CM | POA: Diagnosis not present

## 2023-06-03 NOTE — Progress Notes (Signed)
Sharon Cole is a 68 y.o. female patient   06/03/2023  Treatment Plan: Diagnosis 296.32 (Major depressive affective disorder, recurrent episode, moderate) [n/a]  300.02 (Generalized anxiety disorder) [n/a]  Symptoms Depressed or irritable mood. (Status: maintained) -- No Description Entered  Feelings of hopelessness, worthlessness, or inappropriate guilt. (Status: maintained) -- No Description Entered  Lack of energy. (Status: maintained) -- No Description Entered  Low self-esteem. (Status: maintained) -- No Description Entered  Medication Status compliance  Safety none  If Suicidal or Homicidal State Action Taken: unspecified  Current Risk: low Medications Abilify (Dosage: .25mg )  Buspar (Dosage: 30mg )  Citalopram (Dosage: 20mg )  Topomax (Dosage: unknown)  Objectives Related Problem: Recognize, accept, and cope with feelings of depression. Description: Identify and replace thoughts and beliefs that support depression. Target Date: 2024-04-13 Frequency: Daily Modality: individual Progress: 80%  Related Problem: Recognize, accept, and cope with feelings of depression. Description: Learn and implement behavioral strategies to overcome depression. Target Date: 2024-04-13 Frequency: Daily Modality: individual Progress: 75%  Related Problem: Recognize, accept, and cope with feelings of depression. Description: Verbalize an understanding and resolution of current interpersonal problems. Target Date: 2024-04-13 Frequency: Daily Modality: individual Progress: 90%  Related Problem: Recognize, accept,  and cope with feelings of depression. Description: Verbalize insight into how past relationships may be influencing current experiences with depression. Target Date: 2024-04-13 Frequency: Daily Modality: individual Progress: 90%  Client Response full compliance  Service Location Location, [address] [place], [place] [address]  Service Code cpt [phone]  Normalize/Reframe  Facilitate problem solving  Identify/label emotions  Validate/empathize  Emotion regulation skills  Self care activities  Lifestyle change (exercise, nutrition)  Self-monitoring  Identified an insight  Rationally challenge thoughts or beliefs/cognitive restructuring  Session notes:   Goals/Plan: Wants to work on being genuine and being satisfied with herself and develop stronger self-esteem. Also, would like to improve her primary relationship and have her interpersonal and romantic needs met. This goal is now met, as she had ended the relationship. Wants to continue self-care and maintain weight loss. Needs to develop strategy to manage relationship with her son Sharon Hua, who struggles with mental health issues. Goal date 12-25. 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 Will also attempt to create a small, but satisfying social network with like-minded people. Goal date is 12-25.   Meds:Citalopram (  20mg ), Buspar, Abilify 5mg  , Remeron Patient agrees to video Caregility session and is aware of the limitations of this platform. She is at home and I am at my home office.   Sharon Cole is taking 10 days off to take care of her grandchildren while her kids are at [employer]. She is going to talk to her psychiatrist about increasing her anxiety medication in anticipation of restarting the class she failed. She also has to secure a clinical placement and is very anxious about finding something. She says that her class ends next week and she will start her search. We discussed Buspar. She is on  max dose and will likely need to change meds. Also addressed the need to be cautious with Benzos.                                               [name], PhD  Time: 4:10p-5:00p 50 minutes.

## 2023-06-10 ENCOUNTER — Ambulatory Visit: Payer: Medicare Other | Admitting: Psychology

## 2023-06-13 ENCOUNTER — Ambulatory Visit: Payer: Medicare Other | Admitting: Psychology

## 2023-06-13 DIAGNOSIS — F331 Major depressive disorder, recurrent, moderate: Secondary | ICD-10-CM

## 2023-06-13 DIAGNOSIS — F411 Generalized anxiety disorder: Secondary | ICD-10-CM

## 2023-06-13 NOTE — Progress Notes (Signed)
 Sharon Cole is a 68 y.o. female patient   06/13/2023  Treatment Plan: Diagnosis 296.32 (Major depressive affective disorder, recurrent episode, moderate) [n/a]  300.02 (Generalized anxiety disorder) [n/a]  Symptoms Depressed or irritable mood. (Status: maintained) -- No Description Entered  Feelings of hopelessness, worthlessness, or inappropriate guilt. (Status: maintained) -- No Description Entered  Lack of energy. (Status: maintained) -- No Description Entered  Low self-esteem. (Status: maintained) -- No Description Entered  Medication Status compliance  Safety none  If Suicidal or Homicidal State Action Taken: unspecified  Current Risk: low Medications Abilify  (Dosage: .25mg )  Buspar  (Dosage: 30mg )  Citalopram (Dosage: 20mg )  Topomax (Dosage: unknown)  Objectives Related Problem: Recognize, accept, and cope with feelings of depression. Description: Identify and replace thoughts and beliefs that support depression. Target Date: 2024-04-13 Frequency: Daily Modality: individual Progress: 80%  Related Problem: Recognize, accept, and cope with feelings of depression. Description: Learn and implement behavioral strategies to overcome depression. Target Date: 2024-04-13 Frequency: Daily Modality: individual Progress: 75%  Related Problem: Recognize, accept, and cope with feelings of depression. Description: Verbalize an understanding and resolution of current interpersonal problems. Target Date: 2024-04-13 Frequency: Daily Modality: individual Progress: 90%  Related  Problem: Recognize, accept, and cope with feelings of depression. Description: Verbalize insight into how past relationships may be influencing current experiences with depression. Target Date: 2024-04-13 Frequency: Daily Modality: individual Progress: 90%  Client Response full compliance  Service Location Location, [address] [place], [place] [address]  Service Code cpt [id number]  Normalize/Reframe  Facilitate problem solving  Identify/label emotions  Validate/empathize  Emotion regulation skills  Self care activities  Lifestyle change (exercise, nutrition)  Self-monitoring  Identified an insight  Rationally challenge thoughts or beliefs/cognitive restructuring  Session notes:   Goals/Plan: Wants to work on being genuine and being satisfied with herself and develop stronger self-esteem. Also, would like to improve her primary relationship and have her interpersonal and romantic needs met. This goal is now met, as she had ended the relationship. Wants to continue self-care and maintain weight loss. Needs to develop strategy to manage relationship with her son Sharon Cole, who struggles with mental health issues. Goal date 12-25. 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 Will also attempt to create a  small, but satisfying social network with like-minded people. Goal date is 12-25.   Meds:Citalopram (20mg ), Buspar , Abilify  5mg  , Remeron  Patient agrees to video Caregility session and is aware of the limitations of this platform. She is at home and I am at my home office.   Sharon Cole is finished taking care of her grandchildren and it was exhausting. She was able to keep up with class and didn't get behind. She turned her final in on Sunday. Will finish the semester on the weekend. The next session starts in 2 weeks. She has to repeat the one class she dropped and is worried. She feels she is unable to get the information and demonstrate a command of the  information. We talked about how to approach the new semester differently and in a way to minimize anxiety. 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 I talked about her self-care and she has not planned anything. She is considering using one of her gift-cards for a massage. She is frustrated that she is stuck with regard to trying to lose weight. Her weight is up and her attempts to restrict fails after losing a few pounds. Suggested that she call [facility].                                                    Sharon Sharon Cole [name], PhD  Time: 8:45a-9:30a 45 minutes.

## 2023-06-16 ENCOUNTER — Ambulatory Visit (HOSPITAL_BASED_OUTPATIENT_CLINIC_OR_DEPARTMENT_OTHER): Payer: Medicare Other | Admitting: Student

## 2023-06-16 DIAGNOSIS — F331 Major depressive disorder, recurrent, moderate: Secondary | ICD-10-CM

## 2023-06-16 DIAGNOSIS — F324 Major depressive disorder, single episode, in partial remission: Secondary | ICD-10-CM

## 2023-06-16 DIAGNOSIS — F411 Generalized anxiety disorder: Secondary | ICD-10-CM

## 2023-06-16 MED ORDER — ARIPIPRAZOLE 5 MG PO TABS
5.0000 mg | ORAL_TABLET | Freq: Every day | ORAL | 2 refills | Status: DC
Start: 2023-06-16 — End: 2023-08-18

## 2023-06-16 MED ORDER — MIRTAZAPINE 15 MG PO TABS
15.0000 mg | ORAL_TABLET | Freq: Every day | ORAL | 2 refills | Status: DC
Start: 2023-06-16 — End: 2023-08-18

## 2023-06-16 MED ORDER — BUSPIRONE HCL 30 MG PO TABS: 30.0000 mg | ORAL_TABLET | Freq: Two times a day (BID) | ORAL | 2 refills | Status: DC

## 2023-06-16 MED ORDER — ESCITALOPRAM OXALATE 5 MG PO TABS
15.0000 mg | ORAL_TABLET | Freq: Every day | ORAL | 2 refills | Status: DC
Start: 2023-06-16 — End: 2023-08-18

## 2023-06-17 ENCOUNTER — Ambulatory Visit: Payer: Medicare Other | Admitting: Psychology

## 2023-06-17 DIAGNOSIS — F411 Generalized anxiety disorder: Secondary | ICD-10-CM | POA: Diagnosis not present

## 2023-06-17 DIAGNOSIS — F331 Major depressive disorder, recurrent, moderate: Secondary | ICD-10-CM

## 2023-06-17 NOTE — Progress Notes (Signed)
 BH MD Outpatient Progress Note  06/17/2023 4:51 PM Sharon Cole  MRN:  [id number]  Assessment:  Sharon Cole presents for follow-up evaluation in-person.  At the last appointment Lexapro was decreased from 20 mg daily to 10 mg daily, in line with standard recommendation that 10 mg is the maximum dose for elderly patients.  Since that time the patient reports a significant increase in her anxiety.  She reports difficulties with schoolwork and has noted a significant increase in synthetic marijuana/vape pen use (which she says she uses to help manage her anxiety).  We discussed the deleterious impact her frequent marijuana use will have on anxiety as well as concentration, and that it will limit the effectiveness of medication interventions.  That being said, the patient may benefit from a slight increase in her Lexapro, which is not unreasonable to do for a limited period of time.  We did discuss the importance of the patient establishing with a primary care physician in order for her to get an EKG, necessary for monitoring while the patient is on Lexapro and Abilify.  The patient expressed her understanding and said that she would follow-up.  Identifying Information: Sharon Cole is a 68 y.o. y.o. female with a history of generalized anxiety disorder and major depressive disorder, moderate, who is an established patient with [facility] for management of anxiety.   Plan:  # Generalized anxiety disorder  Hx major depressive disorder, moderate Interventions: -- Increase Lexapro from 10 mg daily to 15 mg daily - Continue Remeron 15 mg nightly - Continue Abilify 5 mg nightly for augmentation of antidepressant medications - Continue buspirone 30 mg twice daily - Continue therapy with Sharon Cole  # Cannabis use disorder Interventions: -- Patient has noted significant increase in synthetic marijuana use, currently uninterested in decreasing use - Continue to  engage patient motivational interviewing  #Long term use of antipsychotic medication -- Lipid panel from November 2024, LDL 125 - A1c from November 2024, 5.9 - Patient was asked to establish with a primary care physician at the last appointment to get an EKG.  She expressed her apologies for not doing this.  We will follow-up at the next appointment.   Patient was given contact information for behavioral health clinic and was instructed to call 911 for emergencies.   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  She reports failing 1 class, and she says that she feels it was due to her professor being unreasonable.  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  The patient reports sleeping 8 hours per night and feels rested in the morning.  She reports occasional binge eating episodes which have occurred for years.  She reports that her mood is "normal".  She states that her level of energy is adequate throughout the day.  She denies experiencing hopelessness or suicidal thoughts.  Despite this, the patient reports severe generalized anxiety about her schoolwork and about events in the news.  She says that a large amount of her time is spent ruminating on this.  She feels that this is increased to a great degree over the past 3 months.  She denies experiencing any panic attacks.  Visit Diagnosis:    ICD-10-CM   1. GAD (generalized anxiety disorder)  F41.1 mirtazapine (REMERON) 15 MG tablet  busPIRone (BUSPAR) 30 MG tablet    ARIPiprazole (ABILIFY) 5 MG tablet    escitalopram (LEXAPRO) 5 MG tablet    2. Major depressive disorder in partial remission, unspecified whether recurrent (HCC)  F32.4     3. Moderate episode of recurrent major depressive disorder (HCC)  F33.1 mirtazapine (REMERON) 15 MG tablet    busPIRone  (BUSPAR) 30 MG tablet    ARIPiprazole (ABILIFY) 5 MG tablet    escitalopram (LEXAPRO) 5 MG tablet      Past Psychiatric History: The patient denies any history of suicide attempts, no identifiable psychiatric hospitalizations in the medical record  Past Medical History:  Past Medical History:  Diagnosis Date   Anxiety    Bradycardia    Common migraine with intractable migraine 07/03/2016   Depression    Dizziness    Headache    Menopause    Syncope     Past Surgical History:  Procedure Laterality Date   BACK SURGERY     cyst removal    BREAST SURGERY     breast reduction   BUNIONECTOMY     CHOLECYSTECTOMY     KNEE ARTHROSCOPY      Family Psychiatric History: None pertinent  Family History:  Family History  Problem Relation Age of Onset   Heart disease Mother    Heart disease Brother    Heart disease Maternal Grandmother    Heart disease Maternal Grandfather    Cancer Son        unknown    Social History:  Social History   Socioeconomic History   Marital status: Significant Other    Spouse name: Not on file   Number of children: 2   Years of education: Masters   Highest education level: Not on file  Occupational History   Not on file  Tobacco Use   Smoking status: Never   Smokeless tobacco: Never  Vaping Use   Vaping status: Never Used  Substance and Sexual Activity   Alcohol use: Yes    Alcohol/week: 1.0 standard drink of alcohol    Types: 1 Glasses of wine per week    Comment: daily   Drug use: No   Sexual activity: Yes    Partners: Male  Other Topics Concern   Not on file  Social History Narrative   Lives   Caffeine use:    Drinks 16oz caffeine drinks a day    Social Drivers of Corporate investment banker Strain: Not on file  Food Insecurity: Not on file  Transportation Needs: Not on file  Physical Activity: Not on file  Stress: Not on file  Social Connections: Not on file    Allergies: No Known Allergies  Current  Medications: Current Outpatient Medications  Medication Sig Dispense Refill   escitalopram (LEXAPRO) 5 MG tablet Take 3 tablets (15 mg total) by mouth daily. 90 tablet 2   ARIPiprazole (ABILIFY) 5 MG tablet Take 1 tablet (5 mg total) by mouth daily. 30 tablet 2   busPIRone (BUSPAR) 30 MG tablet Take 1 tablet (30 mg total) by mouth 2 (two) times daily. 60 tablet 2   calcium-vitamin D (OSCAL WITH D) 250-125 MG-UNIT tablet Take 1 tablet by mouth daily.     mirtazapine (REMERON) 15 MG tablet Take 1 tablet (15 mg total) by mouth at bedtime. 30 tablet 2   Multiple Vitamins-Minerals (MULTIVITAMIN PO) Take 1 tablet by mouth daily.     omeprazole (PRILOSEC) 10 MG capsule Take 10 mg by mouth daily.  Rimegepant Sulfate (NURTEC) 75 MG TBDP Take 75 mg by mouth as needed (take 1 at onset of headache, max is 1 tablet in 24 hours). 8 tablet 11   No current facility-administered medications for this visit.     Objective:  Psychiatric Specialty Exam: Physical Exam Constitutional:      Appearance: the patient is not toxic-appearing.  Pulmonary:     Effort: Pulmonary effort is normal.  Neurological:     General: No focal deficit present.     Mental Status: the patient is alert and oriented to person, place, and time.   Review of Systems  Respiratory:  Negative for shortness of breath.   Cardiovascular:  Negative for chest pain.  Gastrointestinal:  Negative for abdominal pain, constipation, diarrhea, nausea and vomiting.  Neurological:  Negative for headaches.      BP 126/83   Pulse 71   Wt 229 lb (103.9 kg)   BMI 38.11 kg/m   General Appearance: Fairly Groomed  Eye Contact:  Good  Speech:  Clear and Coherent  Volume:  Normal  Mood:  Euthymic  Affect: Incongruent, anxious  Thought Process:  Coherent  Orientation:  Full (Time, Place, and Person)  Thought Content: Logical   Suicidal Thoughts:  No  Homicidal Thoughts:  No  Memory:  Immediate;   Good  Judgement:  fair  Insight:  fair   Psychomotor Activity:  Normal  Concentration:  Concentration: Good  Recall:  Good  Fund of Knowledge: Good  Language: Good  Akathisia:  No  Handed:    AIMS (if indicated): not done  Assets:  Communication Skills Desire for Improvement Financial Resources/Insurance Housing Leisure Time Physical Health  ADL's:  Intact  Cognition: WNL  Sleep:  Fair     Metabolic Disorder Labs: Lab Results  Component Value Date   HGBA1C 5.9 (H) 03/25/2023   No results found for: "PROLACTIN" Lab Results  Component Value Date   CHOL 198 03/25/2023   TRIG 102 03/25/2023   HDL 55 03/25/2023   CHOLHDL 3.6 03/25/2023   VLDL 16.1 01/02/2015   LDLCALC 125 (H) 03/25/2023   LDLCALC 108 (H) 02/05/2022   Lab Results  Component Value Date   TSH 1.760 02/05/2022   TSH 1.04 01/02/2015    Therapeutic Level Labs: No results found for: "LITHIUM" No results found for: "VALPROATE" No results found for: "CBMZ"  Screenings:  Collaboration of Care: none  A total of 30 minutes was spent involved in face to face clinical care, chart review, documentation.   Carlyn Reichert, MD 06/17/2023, 4:51 PM

## 2023-06-17 NOTE — Progress Notes (Signed)
Sharon Cole is a 68 y.o. female patient   06/17/2023  Treatment Plan: Diagnosis 296.32 (Major depressive affective disorder, recurrent episode, moderate) [n/a]  300.02 (Generalized anxiety disorder) [n/a]  Symptoms Depressed or irritable mood. (Status: maintained) -- No Description Entered  Feelings of hopelessness, worthlessness, or inappropriate guilt. (Status: maintained) -- No Description Entered  Lack of energy. (Status: maintained) -- No Description Entered  Low self-esteem. (Status: maintained) -- No Description Entered  Medication Status compliance  Safety none  If Suicidal or Homicidal State Action Taken: unspecified  Current Risk: low Medications Abilify (Dosage: .25mg )  Buspar (Dosage: 30mg )  Citalopram (Dosage: 20mg )  Topomax (Dosage: unknown)  Objectives Related Problem: Recognize, accept, and cope with feelings of depression. Description: Identify and replace thoughts and beliefs that support depression. Target Date: 2024-04-13 Frequency: Daily Modality: individual Progress: 80%  Related Problem: Recognize, accept, and cope with feelings of depression. Description: Learn and implement behavioral strategies to overcome depression. Target Date: 2024-04-13 Frequency: Daily Modality: individual Progress: 75%  Related Problem: Recognize, accept, and cope with feelings of depression. Description: Verbalize an understanding and resolution of current interpersonal problems. Target Date: 2024-04-13 Frequency: Daily Modality:  individual Progress: 90%  Related Problem: Recognize, accept, and cope with feelings of depression. Description: Verbalize insight into how past relationships may be influencing current experiences with depression. Target Date: 2024-04-13 Frequency: Daily Modality: individual Progress: 90%  Client Response full compliance  Service Location Location, [address] [place], [place] [address]  Service Code cpt [phone]  Normalize/Reframe  Facilitate problem solving  Identify/label emotions  Validate/empathize  Emotion regulation skills  Self care activities  Lifestyle change (exercise, nutrition)  Self-monitoring  Identified an insight  Rationally challenge thoughts or beliefs/cognitive restructuring  Session notes:   Goals/Plan: Wants to work on being genuine and being satisfied with herself and develop stronger self-esteem. Also, would like to improve her primary relationship and have her interpersonal and romantic needs met. This goal is now met, as she had ended the relationship. Wants to continue self-care and maintain weight loss. Needs to develop strategy to manage relationship with her son Sharon Cole, who struggles with mental health issues. Goal date 12-25. Sharon Cole is now wanting to create a  new, fulfilling life and pursue her interest in art. Will also attempt to create a small, but satisfying social network with like-minded people. Goal date is 12-25.   Meds:Citalopram (20mg ), Buspar, Abilify 5mg  , Remeron Patient agrees to video Caregility session and is aware of the limitations of this platform. She is at home and I am at my home office.   Sharon Cole says she is feeling sick with a sore throat. States she is having a hard time about having failed the course from last semester. She says it has taken her confidence. We discussed how to move past the experience and start the next semester in a more positive mindset. She did start to look at the list of possible clinical placements as outlined  by the school. She still has to get the insurance and add her own placements to the list. She has started to look for a new doctor.  Her psychiatrist increased her Lexapro to 15mg . She started the higher dose last night.                                                     [name], PhD  Time: 4:15p-5:00p 45 minutes.

## 2023-06-18 NOTE — Addendum Note (Signed)
Addended by: Everlena Cooper on: 06/18/2023 12:32 PM   Modules accepted: Level of Service

## 2023-06-19 ENCOUNTER — Telehealth (HOSPITAL_COMMUNITY): Payer: Self-pay | Admitting: *Deleted

## 2023-06-19 NOTE — Telephone Encounter (Signed)
Fax received for PA of Escitalopram 5mg . Called and spoke with Sadie Haber who gave approval until 06/18/24 with case ID #[id number]. Also gave approval of 10mg  until 09/25/23 with case #[id number]. Called to notify pharmacy. Also notified front desk that patient has [employer] with [id number].

## 2023-06-24 ENCOUNTER — Ambulatory Visit: Payer: Medicare Other | Admitting: Psychology

## 2023-06-24 DIAGNOSIS — F411 Generalized anxiety disorder: Secondary | ICD-10-CM

## 2023-06-24 DIAGNOSIS — F331 Major depressive disorder, recurrent, moderate: Secondary | ICD-10-CM | POA: Diagnosis not present

## 2023-06-24 NOTE — Progress Notes (Signed)
Sharon Cole is a 68 y.o. female patient   06/24/2023  Treatment Plan: Diagnosis 296.32 (Major depressive affective disorder, recurrent episode, moderate) [n/a]  300.02 (Generalized anxiety disorder) [n/a]  Symptoms Depressed or irritable mood. (Status: maintained) -- No Description Entered  Feelings of hopelessness, worthlessness, or inappropriate guilt. (Status: maintained) -- No Description Entered  Lack of energy. (Status: maintained) -- No Description Entered  Low self-esteem. (Status: maintained) -- No Description Entered  Medication Status compliance  Safety none  If Suicidal or Homicidal State Action Taken: unspecified  Current Risk: low Medications Abilify (Dosage: .25mg )  Buspar (Dosage: 30mg )  Citalopram (Dosage: 20mg )  Topomax (Dosage: unknown)  Objectives Related Problem: Recognize, accept, and cope with feelings of depression. Description: Identify and replace thoughts and beliefs that support depression. Target Date: 2024-04-13 Frequency: Daily Modality: individual Progress: 80%  Related Problem: Recognize, accept, and cope with feelings of depression. Description: Learn and implement behavioral strategies to overcome depression. Target Date: 2024-04-13 Frequency: Daily Modality: individual Progress: 75%  Related Problem: Recognize, accept, and cope with feelings of depression. Description: Verbalize an understanding and resolution of current interpersonal problems. Target Date:  2024-04-13 Frequency: Daily Modality: individual Progress: 90%  Related Problem: Recognize, accept, and cope with feelings of depression. Description: Verbalize insight into how past relationships may be influencing current experiences with depression. Target Date: 2024-04-13 Frequency: Daily Modality: individual Progress: 90%  Client Response full compliance  Service Location Location, [address] [place], [place] [address]  Service Code cpt [phone]  Normalize/Reframe  Facilitate problem solving  Identify/label emotions  Validate/empathize  Emotion regulation skills  Self care activities  Lifestyle change (exercise, nutrition)  Self-monitoring  Identified an insight  Rationally challenge thoughts or beliefs/cognitive restructuring  Session notes:   Goals/Plan: Wants to work on being genuine and being satisfied with herself and develop stronger self-esteem. Also, would like to improve her primary relationship and have her interpersonal and romantic needs met. This goal is now met, as she had ended the relationship. Wants to continue self-care and maintain weight loss. Needs to develop strategy to manage relationship with her son [name], who  struggles with mental health issues. Goal date 12-25. 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 Will also attempt to create a small, but satisfying social network with like-minded people. Goal date is 12-25.   Meds:Lexapro 15mg , Buspar, Abilify 5mg  , Remeron Patient agrees to video Caregility session and is aware of the limitations of this platform. She is at home and I am at my home office.   [name] she recovered from being sick last week. She says she can be very content doing nothing during the day and it is "not problematic" for her. She is getting things done as needed, particularly in regards to school. We talked about her strategy, but she is anxious about her process. Medication has helped  and she does feel driven to complete tasks. The "only thing that weights on me is my weight". Says she is "so frustrated" and doesn't know what to do. States she is the heaviest she has ever been. She is torn about relationships and thinks the weight is a way to avoid the issue.                                                         [name], PhD  Time: 1:15p-2:00p 45 minutes.

## 2023-07-01 ENCOUNTER — Ambulatory Visit: Payer: Medicare Other | Admitting: Psychology

## 2023-07-08 ENCOUNTER — Ambulatory Visit: Payer: Medicare Other | Admitting: Psychology

## 2023-07-08 DIAGNOSIS — F411 Generalized anxiety disorder: Secondary | ICD-10-CM | POA: Diagnosis not present

## 2023-07-08 DIAGNOSIS — F331 Major depressive disorder, recurrent, moderate: Secondary | ICD-10-CM | POA: Diagnosis not present

## 2023-07-08 NOTE — Progress Notes (Signed)
 Sharon Cole is a 68 y.o. female patient   07/08/2023  Treatment Plan: Diagnosis 296.32 (Major depressive affective disorder, recurrent episode, moderate) [n/a]  300.02 (Generalized anxiety disorder) [n/a]  Symptoms Depressed or irritable mood. (Status: maintained) -- No Description Entered  Feelings of hopelessness, worthlessness, or inappropriate guilt. (Status: maintained) -- No Description Entered  Lack of energy. (Status: maintained) -- No Description Entered  Low self-esteem. (Status: maintained) -- No Description Entered  Medication Status compliance  Safety none  If Suicidal or Homicidal State Action Taken: unspecified  Current Risk: low Medications Abilify (Dosage: .25mg )  Buspar (Dosage: 30mg )  Citalopram (Dosage: 20mg )  Topomax (Dosage: unknown)  Objectives Related Problem: Recognize, accept, and cope with feelings of depression. Description: Identify and replace thoughts and beliefs that support depression. Target Date: 2024-04-13 Frequency: Daily Modality: individual Progress: 80%  Related Problem: Recognize, accept, and cope with feelings of depression. Description: Learn and implement behavioral strategies to overcome depression. Target Date: 2024-04-13 Frequency: Daily Modality: individual Progress: 75%  Related Problem: Recognize, accept, and cope with feelings of depression. Description: Verbalize an understanding and resolution of current interpersonal  problems. Target Date: 2024-04-13 Frequency: Daily Modality: individual Progress: 90%  Related Problem: Recognize, accept, and cope with feelings of depression. Description: Verbalize insight into how past relationships may be influencing current experiences with depression. Target Date: 2024-04-13 Frequency: Daily Modality: individual Progress: 90%  Client Response full compliance  Service Location Location, [address] [place], [place] [address]  Service Code cpt [phone]  Normalize/Reframe  Facilitate problem solving  Identify/label emotions  Validate/empathize  Emotion regulation skills  Self care activities  Lifestyle change (exercise, nutrition)  Self-monitoring  Identified an insight  Rationally challenge thoughts or beliefs/cognitive restructuring  Session notes:   Goals/Plan: Wants to work on being genuine and being satisfied with herself and develop stronger self-esteem. Also, would like to improve her primary relationship and have her interpersonal and romantic needs met. This goal is now met, as she had ended the relationship. Wants to continue self-care and  maintain weight loss. Needs to develop strategy to manage relationship with her son Sharon Hua, who struggles with mental health issues. Goal date 12-25. 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 Will also attempt to create a small, but satisfying social network with like-minded people. Goal date is 12-25.   Meds:Lexapro 15mg , Buspar, Abilify 5mg  , Remeron Patient agrees to video Caregility session and is aware of the limitations of this platform. She is at home and I am at my home office.   Sharon Cole started back to school last week. She is hopeful that the class she is repeating will be easier to manage. Says she did not accomplish what she had hoped during the break. This is particularly an issue with regard to finding a clinical placement for summer. Son Sharon Cole is not progressing  and his father "cannot cut him off". He asked Sharon Cole to reach out to Sharon Cole to make an appeal for him to call his father. Sharon Cole says she is feeling "overall, fair to [place]". She says "school is a little overwhelming", but has a handle on it. She is seeking a "work out app" so she can get some exercise. Brother is now on Ozempic and she is considering, given that she needs to lose 50-70 lbs. Will explore and discuss further.                                                            [name], PhD  Time: 1:15p-2:00p 45 minutes.

## 2023-07-15 ENCOUNTER — Ambulatory Visit: Payer: Medicare Other | Admitting: Psychology

## 2023-07-15 DIAGNOSIS — F411 Generalized anxiety disorder: Secondary | ICD-10-CM | POA: Diagnosis not present

## 2023-07-15 DIAGNOSIS — F331 Major depressive disorder, recurrent, moderate: Secondary | ICD-10-CM | POA: Diagnosis not present

## 2023-07-15 NOTE — Progress Notes (Signed)
 Sharon Cole is a 68 y.o. female patient   07/15/2023  Treatment Plan: Diagnosis 296.32 (Major depressive affective disorder, recurrent episode, moderate) [n/a]  300.02 (Generalized anxiety disorder) [n/a]  Symptoms Depressed or irritable mood. (Status: maintained) -- No Description Entered  Feelings of hopelessness, worthlessness, or inappropriate guilt. (Status: maintained) -- No Description Entered  Lack of energy. (Status: maintained) -- No Description Entered  Low self-esteem. (Status: maintained) -- No Description Entered  Medication Status compliance  Safety none  If Suicidal or Homicidal State Action Taken: unspecified  Current Risk: low Medications Abilify (Dosage: .25mg )  Buspar (Dosage: 30mg )  Citalopram (Dosage: 20mg )  Topomax (Dosage: unknown)  Objectives Related Problem: Recognize, accept, and cope with feelings of depression. Description: Identify and replace thoughts and beliefs that support depression. Target Date: 2024-04-13 Frequency: Daily Modality: individual Progress: 80%  Related Problem: Recognize, accept, and cope with feelings of depression. Description: Learn and implement behavioral strategies to overcome depression. Target Date: 2024-04-13 Frequency: Daily Modality: individual Progress: 75%  Related Problem: Recognize, accept, and cope with feelings of depression. Description: Verbalize an understanding and resolution of  current interpersonal problems. Target Date: 2024-04-13 Frequency: Daily Modality: individual Progress: 90%  Related Problem: Recognize, accept, and cope with feelings of depression. Description: Verbalize insight into how past relationships may be influencing current experiences with depression. Target Date: 2024-04-13 Frequency: Daily Modality: individual Progress: 90%  Client Response full compliance  Service Location Location, [address] [place], [place] [address]  Service Code cpt [phone]  Normalize/Reframe  Facilitate problem solving  Identify/label emotions  Validate/empathize  Emotion regulation skills  Self care activities  Lifestyle change (exercise, nutrition)  Self-monitoring  Identified an insight  Rationally challenge thoughts or beliefs/cognitive restructuring  Session notes:   Goals/Plan: Wants to work on being genuine and being satisfied with herself and develop stronger self-esteem. Also, would like to improve her primary relationship and have her interpersonal and romantic needs met. This  goal is now met, as she had ended the relationship. Wants to continue self-care and maintain weight loss. Needs to develop strategy to manage relationship with her son Sharon Hua, who struggles with mental health issues. Goal date 12-25. 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 Will also attempt to create a small, but satisfying social network with like-minded people. Goal date is 12-25.   Meds:Lexapro 15mg , Buspar, Abilify 5mg  , Remeron Patient agrees to video Caregility session and is aware of the limitations of this platform. She is at home and I am at my home office.   [name] she has been busy with homework lately. Time management is still an issue, but she is doing well so far. She had to call out from work one day. 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 She is  not doing a very good job with self care lately, saying her motivation is minimal. She does not have a favorite place to walk, but we talked about the benefits of just getting out and having a change of venue. She does say that she would be interested in finding someone to date IF her weight was down. Her weight is her most significant factor limiting her social life. States that she will consider going back on the Keto diet. She has gained 90 pounds in 3 years. Will try to start this week.                                                              [name], PhD  Time: 4:15p-5:00p 45 minutes.

## 2023-07-22 ENCOUNTER — Ambulatory Visit: Payer: Medicare Other | Admitting: Psychology

## 2023-07-29 ENCOUNTER — Ambulatory Visit (INDEPENDENT_AMBULATORY_CARE_PROVIDER_SITE_OTHER): Payer: Medicare Other | Admitting: Psychology

## 2023-07-29 DIAGNOSIS — F411 Generalized anxiety disorder: Secondary | ICD-10-CM

## 2023-07-29 DIAGNOSIS — F331 Major depressive disorder, recurrent, moderate: Secondary | ICD-10-CM | POA: Diagnosis not present

## 2023-07-29 NOTE — Progress Notes (Signed)
 Sharon Cole is a 68 y.o. female patient   07/29/2023  Treatment Plan: Diagnosis 296.32 (Major depressive affective disorder, recurrent episode, moderate) [n/a]  300.02 (Generalized anxiety disorder) [n/a]  Symptoms Depressed or irritable mood. (Status: maintained) -- No Description Entered  Feelings of hopelessness, worthlessness, or inappropriate guilt. (Status: maintained) -- No Description Entered  Lack of energy. (Status: maintained) -- No Description Entered  Low self-esteem. (Status: maintained) -- No Description Entered  Medication Status compliance  Safety none  If Suicidal or Homicidal State Action Taken: unspecified  Current Risk: low Medications Abilify (Dosage: .25mg )  Buspar (Dosage: 30mg )  Citalopram (Dosage: 20mg )  Topomax (Dosage: unknown)  Objectives Related Problem: Recognize, accept, and cope with feelings of depression. Description: Identify and replace thoughts and beliefs that support depression. Target Date: 2024-04-13 Frequency: Daily Modality: individual Progress: 80%  Related Problem: Recognize, accept, and cope with feelings of depression. Description: Learn and implement behavioral strategies to overcome depression. Target Date: 2024-04-13 Frequency: Daily Modality: individual Progress: 75%  Related Problem: Recognize, accept, and cope with feelings of depression. Description: Verbalize an  understanding and resolution of current interpersonal problems. Target Date: 2024-04-13 Frequency: Daily Modality: individual Progress: 90%  Related Problem: Recognize, accept, and cope with feelings of depression. Description: Verbalize insight into how past relationships may be influencing current experiences with depression. Target Date: 2024-04-13 Frequency: Daily Modality: individual Progress: 90%  Client Response full compliance  Service Location Location, [address] [place], [place] [address]  Service Code cpt [phone]  Normalize/Reframe  Facilitate problem solving  Identify/label emotions  Validate/empathize  Emotion regulation skills  Self care activities  Lifestyle change (exercise, nutrition)  Self-monitoring  Identified an insight  Rationally challenge thoughts or beliefs/cognitive restructuring  Session notes:   Goals/Plan: Wants to work on being genuine and being satisfied with herself and develop stronger self-esteem. Also, would  like to improve her primary relationship and have her interpersonal and romantic needs met. This goal is now met, as she had ended the relationship. Wants to continue self-care and maintain weight loss. Needs to develop strategy to manage relationship with her son Sharon Hua, who struggles with mental health issues. Goal date 12-25. 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 Will also attempt to create a small, but satisfying social network with like-minded people. Goal date is 12-25.   Meds:Lexapro 15mg , Buspar, Abilify 5mg  , Remeron Patient agrees to video Caregility session and is aware of the limitations of this platform. She is at home and I am at my home office.   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 She says there is no self care. "It is all work survival at this point". Her confidence is rattled even  though she know that she can mathematically pass the class. The work is taking her an extraordinary amount of time and everything gets turned in at the last minute. We talked about her weight loss efforts and frustrations. Is on Keto and has lost 4 pounds in two weeks and is somewhat optimistic. Also, ex-husband Ron is working toward cutting their son off financially.                                                                   [name], PhD  Time: 4:10p-5:00p 50 minutes.

## 2023-08-05 ENCOUNTER — Ambulatory Visit (INDEPENDENT_AMBULATORY_CARE_PROVIDER_SITE_OTHER): Payer: Medicare Other | Admitting: Psychology

## 2023-08-05 DIAGNOSIS — F411 Generalized anxiety disorder: Secondary | ICD-10-CM | POA: Diagnosis not present

## 2023-08-05 NOTE — Progress Notes (Signed)
 LETRICIA Cole is a 68 y.o. female patient   08/05/2023  Treatment Plan: Diagnosis 296.32 (Major depressive affective disorder, recurrent episode, moderate) [n/a]  300.02 (Generalized anxiety disorder) [n/a]  Symptoms Depressed or irritable mood. (Status: maintained) -- No Description Entered  Feelings of hopelessness, worthlessness, or inappropriate guilt. (Status: maintained) -- No Description Entered  Lack of energy. (Status: maintained) -- No Description Entered  Low self-esteem. (Status: maintained) -- No Description Entered  Medication Status compliance  Safety none  If Suicidal or Homicidal State Action Taken: unspecified  Current Risk: low Medications Abilify (Dosage: .25mg )  Buspar (Dosage: 30mg )  Citalopram (Dosage: 20mg )  Topomax (Dosage: unknown)  Objectives Related Problem: Recognize, accept, and cope with feelings of depression. Description: Identify and replace thoughts and beliefs that support depression. Target Date: 2024-04-13 Frequency: Daily Modality: individual Progress: 80%  Related Problem: Recognize, accept, and cope with feelings of depression. Description: Learn and implement behavioral strategies to overcome depression. Target Date: 2024-04-13 Frequency: Daily Modality: individual Progress: 75%  Related Problem: Recognize, accept, and cope with feelings of  depression. Description: Verbalize an understanding and resolution of current interpersonal problems. Target Date: 2024-04-13 Frequency: Daily Modality: individual Progress: 90%  Related Problem: Recognize, accept, and cope with feelings of depression. Description: Verbalize insight into how past relationships may be influencing current experiences with depression. Target Date: 2024-04-13 Frequency: Daily Modality: individual Progress: 90%  Client Response full compliance  Service Location Location, [address] [place], [place] [address]  Service Code cpt [phone]  Normalize/Reframe  Facilitate problem solving  Identify/label emotions  Validate/empathize  Emotion regulation skills  Self care activities  Lifestyle change (exercise, nutrition)  Self-monitoring  Identified an insight  Rationally challenge thoughts or beliefs/cognitive restructuring  Session notes:   Goals/Plan: Wants to  work on being genuine and being satisfied with herself and develop stronger self-esteem. Also, would like to improve her primary relationship and have her interpersonal and romantic needs met. This goal is now met, as she had ended the relationship. Wants to continue self-care and maintain weight loss. Needs to develop strategy to manage relationship with her son Sharon Cole, who struggles with mental health issues. Goal date 12-25. 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 Will also attempt to create a small, but satisfying social network with like-minded people. Goal date is 12-25.   Meds:Lexapro 15mg , Buspar, Abilify 5mg  , Remeron Patient agrees to video Caregility session and is aware of the limitations of this platform. She is at home and I am at my home office.   Sharon Cole says she again messed up her research class. She says that she cannot pass the class and has to withdraw. She can complete her other class, but will have to retake the research class. She feels  it was an error not related to any ambivalence about being in the program. She is feeling overwhelmed and realizes she did a lot of late nights in a row. She is a little worried about her cognitive process. She says she is barely getting her reading done and is passing other classes only because she does assignments and reads only what is necessary. She relies on AI to explaining to her what she is reading. We talked about how to use her "extra" time. Also, the need to continue to focus on self care. She has lost weight and is enjoying her success.                                                                       [name], PhD  Time: 1:15p-2:00p 45 minutes.

## 2023-08-12 ENCOUNTER — Ambulatory Visit (INDEPENDENT_AMBULATORY_CARE_PROVIDER_SITE_OTHER): Payer: Medicare Other | Admitting: Psychology

## 2023-08-12 DIAGNOSIS — F411 Generalized anxiety disorder: Secondary | ICD-10-CM

## 2023-08-12 DIAGNOSIS — F331 Major depressive disorder, recurrent, moderate: Secondary | ICD-10-CM | POA: Diagnosis not present

## 2023-08-12 NOTE — Progress Notes (Signed)
 Sharon Cole is a 68 y.o. female patient   08/12/2023  Treatment Plan: Diagnosis 296.32 (Major depressive affective disorder, recurrent episode, moderate) [n/a]  300.02 (Generalized anxiety disorder) [n/a]  Symptoms Depressed or irritable mood. (Status: maintained) -- No Description Entered  Feelings of hopelessness, worthlessness, or inappropriate guilt. (Status: maintained) -- No Description Entered  Lack of energy. (Status: maintained) -- No Description Entered  Low self-esteem. (Status: maintained) -- No Description Entered  Medication Status compliance  Safety none  If Suicidal or Homicidal State Action Taken: unspecified  Current Risk: low Medications Abilify (Dosage: .25mg )  Buspar (Dosage: 30mg )  Citalopram (Dosage: 20mg )  Topomax (Dosage: unknown)  Objectives Related Problem: Recognize, accept, and cope with feelings of depression. Description: Identify and replace thoughts and beliefs that support depression. Target Date: 2024-04-13 Frequency: Daily Modality: individual Progress: 80%  Related Problem: Recognize, accept, and cope with feelings of depression. Description: Learn and implement behavioral strategies to overcome depression. Target Date: 2024-04-13 Frequency: Daily Modality: individual Progress: 75%  Related Problem: Recognize, accept, and cope  with feelings of depression. Description: Verbalize an understanding and resolution of current interpersonal problems. Target Date: 2024-04-13 Frequency: Daily Modality: individual Progress: 90%  Related Problem: Recognize, accept, and cope with feelings of depression. Description: Verbalize insight into how past relationships may be influencing current experiences with depression. Target Date: 2024-04-13 Frequency: Daily Modality: individual Progress: 90%  Client Response full compliance  Service Location Location, [address] [place], [place] [address]  Service Code cpt [phone]  Normalize/Reframe  Facilitate problem solving  Identify/label emotions  Validate/empathize  Emotion regulation skills  Self care activities  Lifestyle change (exercise, nutrition)  Self-monitoring  Identified an insight  Rationally challenge thoughts or beliefs/cognitive restructuring  Session notes:   Goals/Plan: Wants to work on being genuine and being satisfied with herself and develop stronger self-esteem. Also, would like to improve her primary relationship and have her interpersonal and romantic needs met. This goal is now met, as she had ended the relationship. Wants to continue self-care and maintain weight loss. Needs to develop strategy to manage relationship with her son Sharon Hua, who struggles with mental health issues. Goal date 12-25. 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 Will also attempt to create a small, but satisfying social network with like-minded people. Goal date is 12-25.   Meds:Lexapro 15mg , Buspar, Abilify 5mg  , Remeron Patient agrees to video Caregility session and is aware of the limitations of this platform. She is at home and I am at my home office.   Sharon Cole says she is pleased she could withdraw rather than take a fail in the class. She is taking class on older adulthood and says it is sad. She is having trouble fully  comprehending or following instructions of assignments. She is meeting with son Sharon Cole tomorrow to catch up at his request. She is going to ask some difficult questions to Sharon Cole and we discussed how she might respond to various answers.                                                                          Sharon Ridgel, PhD  Time: 1:15p-2:00p 45 minutes.

## 2023-08-18 ENCOUNTER — Ambulatory Visit (HOSPITAL_COMMUNITY): Payer: Medicare Other | Admitting: Student

## 2023-08-18 ENCOUNTER — Encounter: Payer: Self-pay | Admitting: Family Medicine

## 2023-08-18 VITALS — BP 136/92 | HR 72 | Wt 226.0 lb

## 2023-08-18 DIAGNOSIS — F331 Major depressive disorder, recurrent, moderate: Secondary | ICD-10-CM | POA: Diagnosis not present

## 2023-08-18 DIAGNOSIS — F411 Generalized anxiety disorder: Secondary | ICD-10-CM | POA: Diagnosis not present

## 2023-08-18 DIAGNOSIS — R7303 Prediabetes: Secondary | ICD-10-CM | POA: Insufficient documentation

## 2023-08-18 DIAGNOSIS — F410 Panic disorder [episodic paroxysmal anxiety] without agoraphobia: Secondary | ICD-10-CM | POA: Diagnosis not present

## 2023-08-18 DIAGNOSIS — E785 Hyperlipidemia, unspecified: Secondary | ICD-10-CM | POA: Insufficient documentation

## 2023-08-18 HISTORY — DX: Hyperlipidemia, unspecified: E78.5

## 2023-08-18 HISTORY — DX: Prediabetes: R73.03

## 2023-08-18 MED ORDER — BUSPIRONE HCL 30 MG PO TABS: 30.0000 mg | ORAL_TABLET | Freq: Two times a day (BID) | ORAL | 2 refills | Status: DC

## 2023-08-18 MED ORDER — ARIPIPRAZOLE 5 MG PO TABS: 5.0000 mg | ORAL_TABLET | Freq: Every day | ORAL | 2 refills | Status: DC

## 2023-08-18 MED ORDER — ESCITALOPRAM OXALATE 5 MG PO TABS: 15.0000 mg | ORAL_TABLET | Freq: Every day | ORAL | 2 refills | Status: DC

## 2023-08-18 MED ORDER — MIRTAZAPINE 15 MG PO TABS: 15.0000 mg | ORAL_TABLET | Freq: Every day | ORAL | 2 refills | Status: DC

## 2023-08-18 MED ORDER — PROPRANOLOL HCL 10 MG PO TABS: 10.0000 mg | ORAL_TABLET | Freq: Two times a day (BID) | ORAL | 2 refills | Status: DC | PRN

## 2023-08-18 NOTE — Progress Notes (Signed)
 BH MD Outpatient Progress Note  08/18/2023 2:01 PM Sharon Cole  MRN:  [id number]  Assessment:  Sharon Cole presents for follow-up evaluation in-person.  Today the patient reports worsening anxiety and panic attacks secondary to stressors at work and school.  Plan to add propranolol as needed for panic symptoms.  The patient also complains of difficulties with memory and concentration.  We discussed that cessation of synthetic marijuana use may be helpful.  The patient stated her intention to stop using the substance.  Identifying Information: Sharon Cole is a 68 y.o. y.o. female with a history of generalized anxiety disorder and major depressive disorder, moderate, who is an established patient with [facility] for management of anxiety.   Plan:  # Generalized anxiety disorder  Hx major depressive disorder, moderate Interventions: - Start propranolol 10 mg twice daily - Counseled on the risk of orthostasis, and the patient does not have asthma -- Continue Lexapro 15 mg daily - Continue Remeron 15 mg nightly - Continue Abilify 5 mg nightly for augmentation of antidepressant medications - Continue buspirone 30 mg twice daily - Continue therapy with Dr. Dellia Cloud  # Cannabis use disorder Interventions: -- Patient has stated her interest in stopping synthetic marijuana use - Continue to engage patient motivational interviewing  #Long term use of antipsychotic medication -- Lipid panel from November 2024, LDL 125 - A1c from November 2024, 5.9 - EKG: Patient scheduled for PCP visit 4/15, PCP agrees to obtain EKG  Patient was given contact information for behavioral health clinic and was instructed to call 911 for emergencies.   Subjective:  Chief Complaint:  Chief Complaint  Patient presents with   Follow-up    Interval History:  Today the patient reports issues remembering to turn in assignments, and making careless mistakes during her  schoolwork.  She reports difficulty remembering previously memorized information at work.  She reports periodic panic attacks related to feeling overwhelmed at work and at school.  Prominent somatic symptoms appear to be present, including palpitations and nausea.  Despite this, she reports a predominantly euthymic mood, good sleep, and fairly good eating patterns.  She denies experiencing any suicidal thoughts.  We did discuss behavioral interventions for reducing anxiety and panic.  Visit Diagnosis:    ICD-10-CM   1. Panic attacks  F41.0     2. GAD (generalized anxiety disorder)  F41.1     3. Moderate episode of recurrent major depressive disorder (HCC)  F33.1       Past Psychiatric History: The patient denies any history of suicide attempts, no identifiable psychiatric hospitalizations in the medical record  Past Medical History:  Past Medical History:  Diagnosis Date   Anxiety    Bradycardia    Common migraine with intractable migraine 07/03/2016   Depression    Dizziness    Headache    Menopause    Syncope     Past Surgical History:  Procedure Laterality Date   BACK SURGERY     cyst removal    BREAST SURGERY     breast reduction   BUNIONECTOMY     CHOLECYSTECTOMY     KNEE ARTHROSCOPY      Family Psychiatric History: None pertinent  Family History:  Family History  Problem Relation Age of Onset   Heart disease Mother    Heart disease Brother    Heart disease Maternal Grandmother    Heart disease Maternal Grandfather    Cancer Son  unknown    Social History:  Social History   Socioeconomic History   Marital status: Significant Other    Spouse name: Not on file   Number of children: 2   Years of education: Masters   Highest education level: Master's degree (e.g., MA, MS, MEng, MEd, MSW, MBA)  Occupational History   Not on file  Tobacco Use   Smoking status: Never   Smokeless tobacco: Never  Vaping Use   Vaping status: Never Used  Substance and  Sexual Activity   Alcohol use: Yes    Alcohol/week: 1.0 standard drink of alcohol    Types: 1 Glasses of wine per week    Comment: daily   Drug use: No   Sexual activity: Yes    Partners: Male  Other Topics Concern   Not on file  Social History Narrative   Lives   Caffeine use:    Drinks 16oz caffeine drinks a day    Social Drivers of Corporate investment banker Strain: Low Risk  (08/17/2023)   Overall Financial Resource Strain (CARDIA)    Difficulty of Paying Living Expenses: Not very hard  Food Insecurity: No Food Insecurity (08/17/2023)   Hunger Vital Sign    Worried About Running Out of Food in the Last Year: Never true    Ran Out of Food in the Last Year: Never true  Transportation Needs: No Transportation Needs (08/17/2023)   PRAPARE - Administrator, Civil Service (Medical): No    Lack of Transportation (Non-Medical): No  Physical Activity: Unknown (08/17/2023)   Exercise Vital Sign    Days of Exercise per Week: 0 days    Minutes of Exercise per Session: Not on file  Stress: No Stress Concern Present (08/17/2023)   [employer] of Occupational Health - Occupational Stress Questionnaire    Feeling of Stress : Only a little  Social Connections: Socially Isolated (08/17/2023)   Social Connection and Isolation Panel [NHANES]    Frequency of Communication with Friends and Family: Once a week    Frequency of Social Gatherings with Friends and Family: Once a week    Attends Religious Services: Never    Database administrator or Organizations: No    Attends Engineer, structural: Not on file    Marital Status: Divorced    Allergies: No Known Allergies  Current Medications: Current Outpatient Medications  Medication Sig Dispense Refill   ARIPiprazole (ABILIFY) 5 MG tablet Take 1 tablet (5 mg total) by mouth daily. 30 tablet 2   busPIRone (BUSPAR) 30 MG tablet Take 1 tablet (30 mg total) by mouth 2 (two) times daily. 60 tablet 2   calcium-vitamin D  (OSCAL WITH D) 250-125 MG-UNIT tablet Take 1 tablet by mouth daily.     escitalopram (LEXAPRO) 5 MG tablet Take 3 tablets (15 mg total) by mouth daily. 90 tablet 2   mirtazapine (REMERON) 15 MG tablet Take 1 tablet (15 mg total) by mouth at bedtime. 30 tablet 2   Multiple Vitamins-Minerals (MULTIVITAMIN PO) Take 1 tablet by mouth daily.     omeprazole (PRILOSEC) 10 MG capsule Take 10 mg by mouth daily.     Rimegepant Sulfate (NURTEC) 75 MG TBDP Take 75 mg by mouth as needed (take 1 at onset of headache, max is 1 tablet in 24 hours). 8 tablet 11   No current facility-administered medications for this visit.     Objective:  Psychiatric Specialty Exam: Physical Exam Constitutional:  Appearance: the patient is not toxic-appearing.  Pulmonary:     Effort: Pulmonary effort is normal.  Neurological:     General: No focal deficit present.     Mental Status: the patient is alert and oriented to person, place, and time.   Review of Systems  Respiratory:  Negative for shortness of breath.   Cardiovascular:  Negative for chest pain.  Gastrointestinal:  Negative for abdominal pain, constipation, diarrhea, nausea and vomiting.  Neurological:  Negative for headaches.      BP (!) 136/92   Pulse 72   Wt 226 lb (102.5 kg)   BMI 37.61 kg/m   General Appearance: Fairly Groomed  Eye Contact:  Good  Speech:  Clear and Coherent  Volume:  Normal  Mood:  Euthymic  Affect: Incongruent, anxious  Thought Process:  Coherent  Orientation:  Full (Time, Place, and Person)  Thought Content: Logical   Suicidal Thoughts:  No  Homicidal Thoughts:  No  Memory:  Immediate;   Good  Judgement:  fair  Insight:  fair  Psychomotor Activity:  Normal  Concentration:  Concentration: Good  Recall:  Good  Fund of Knowledge: Good  Language: Good  Akathisia:  No  Handed:    AIMS (if indicated): not done  Assets:  Communication Skills Desire for Improvement Financial Resources/Insurance Housing Leisure  Time Physical Health  ADL's:  Intact  Cognition: WNL  Sleep:  Fair     Metabolic Disorder Labs: Lab Results  Component Value Date   HGBA1C 5.9 (H) 03/25/2023   No results found for: "PROLACTIN" Lab Results  Component Value Date   CHOL 198 03/25/2023   TRIG 102 03/25/2023   HDL 55 03/25/2023   CHOLHDL 3.6 03/25/2023   VLDL 19.1 01/02/2015   LDLCALC 125 (H) 03/25/2023   LDLCALC 108 (H) 02/05/2022   Lab Results  Component Value Date   TSH 1.760 02/05/2022   TSH 1.04 01/02/2015    Therapeutic Level Labs: No results found for: "LITHIUM" No results found for: "VALPROATE" No results found for: "CBMZ"  Screenings:  Collaboration of Care: none  A total of 30 minutes was spent involved in face to face clinical care, chart review, documentation.   Marilou Showman, MD 08/18/2023, 2:02 PM

## 2023-08-18 NOTE — Addendum Note (Signed)
 Addended by: Donnelly Gainer on: 08/18/2023 02:31 PM   Modules accepted: Level of Service

## 2023-08-19 ENCOUNTER — Ambulatory Visit (INDEPENDENT_AMBULATORY_CARE_PROVIDER_SITE_OTHER): Payer: Medicare Other | Admitting: Psychology

## 2023-08-19 ENCOUNTER — Encounter: Payer: Self-pay | Admitting: Family Medicine

## 2023-08-19 ENCOUNTER — Ambulatory Visit (INDEPENDENT_AMBULATORY_CARE_PROVIDER_SITE_OTHER): Payer: Self-pay | Admitting: Family Medicine

## 2023-08-19 ENCOUNTER — Encounter (INDEPENDENT_AMBULATORY_CARE_PROVIDER_SITE_OTHER): Payer: Self-pay

## 2023-08-19 VITALS — BP 110/78 | HR 67 | Temp 97.6°F | Ht 65.0 in | Wt 226.0 lb

## 2023-08-19 DIAGNOSIS — M2042 Other hammer toe(s) (acquired), left foot: Secondary | ICD-10-CM

## 2023-08-19 DIAGNOSIS — E669 Obesity, unspecified: Secondary | ICD-10-CM

## 2023-08-19 DIAGNOSIS — F411 Generalized anxiety disorder: Secondary | ICD-10-CM

## 2023-08-19 DIAGNOSIS — L304 Erythema intertrigo: Secondary | ICD-10-CM

## 2023-08-19 DIAGNOSIS — Z780 Asymptomatic menopausal state: Secondary | ICD-10-CM

## 2023-08-19 DIAGNOSIS — Z9889 Other specified postprocedural states: Secondary | ICD-10-CM

## 2023-08-19 DIAGNOSIS — R7303 Prediabetes: Secondary | ICD-10-CM

## 2023-08-19 DIAGNOSIS — Z79899 Other long term (current) drug therapy: Secondary | ICD-10-CM | POA: Diagnosis not present

## 2023-08-19 DIAGNOSIS — E7800 Pure hypercholesterolemia, unspecified: Secondary | ICD-10-CM | POA: Diagnosis not present

## 2023-08-19 DIAGNOSIS — F331 Major depressive disorder, recurrent, moderate: Secondary | ICD-10-CM

## 2023-08-19 DIAGNOSIS — F419 Anxiety disorder, unspecified: Secondary | ICD-10-CM | POA: Diagnosis not present

## 2023-08-19 LAB — LIPID PANEL
Cholesterol: 184 mg/dL (ref 0–200)
HDL: 55.8 mg/dL (ref >39.00)
LDL Cholesterol: 105 mg/dL — ABNORMAL HIGH (ref 0–99)
NonHDL: 128.18
Total CHOL/HDL Ratio: 3
Triglycerides: 118 mg/dL (ref 0.0–149.0)
VLDL: 23.6 mg/dL (ref 0.0–40.0)

## 2023-08-19 LAB — COMPREHENSIVE METABOLIC PANEL WITH GFR
ALT: 24 U/L (ref 0–35)
AST: 21 U/L (ref 0–37)
Albumin: 4.4 g/dL (ref 3.5–5.2)
Alkaline Phosphatase: 78 U/L (ref 39–117)
BUN: 13 mg/dL (ref 6–23)
CO2: 28 meq/L (ref 19–32)
Calcium: 9.4 mg/dL (ref 8.4–10.5)
Chloride: 106 meq/L (ref 96–112)
Creatinine, Ser: 0.85 mg/dL (ref 0.40–1.20)
GFR: 71.19 mL/min (ref >60.00)
Glucose, Bld: 104 mg/dL — ABNORMAL HIGH (ref 70–99)
Potassium: 4.1 meq/L (ref 3.5–5.1)
Sodium: 141 meq/L (ref 135–145)
Total Bilirubin: 0.4 mg/dL (ref 0.2–1.2)
Total Protein: 6.8 g/dL (ref 6.0–8.3)

## 2023-08-19 LAB — CBC
HCT: 44.1 % (ref 36.0–46.0)
Hemoglobin: 14.4 g/dL (ref 12.0–15.0)
MCHC: 32.6 g/dL (ref 30.0–36.0)
MCV: 83.5 fl (ref 78.0–100.0)
Platelets: 215 10*3/uL (ref 150.0–400.0)
RBC: 5.28 Mil/uL — ABNORMAL HIGH (ref 3.87–5.11)
RDW: 15.2 % (ref 11.5–15.5)
WBC: 7.3 10*3/uL (ref 4.0–10.5)

## 2023-08-19 LAB — HEMOGLOBIN A1C: Hgb A1c MFr Bld: 5.7 % (ref 4.6–6.5)

## 2023-08-19 LAB — TSH: TSH: 2.3 u[IU]/mL (ref 0.35–5.50)

## 2023-08-19 MED ORDER — ZEPBOUND 2.5 MG/0.5ML ~~LOC~~ SOAJ: 2.5000 mg | SUBCUTANEOUS | 1 refills | Status: DC

## 2023-08-19 MED ORDER — CLOTRIMAZOLE 1 % EX CREA: 1.0000 | TOPICAL_CREAM | Freq: Two times a day (BID) | CUTANEOUS | 0 refills | Status: DC

## 2023-08-19 NOTE — Progress Notes (Signed)
 New Patient Office Visit  Subjective    Patient ID: Sharon Cole, female    DOB: Sep 20, 1955  Age: 68 y.o. MRN: [id number]  CC:  Chief Complaint  Patient presents with   Establish Care    Under fold of stomach has a rash, has been going on for months. Left foot 3rd toe moved several months ago.  Left ankle swelling    HPI Sharon Cole presents to establish care Previous PCP: none in 4-5 years.  Grew up in [place]    Other providers:  Dr. Jerrel Ivory - psychiatrist  Dr. Dellia Cloud - psychologist   Concerned about anxiety, memory and weight.   States she has gained weight and been at this weight for the past 2 years. States seh is the heaviest she has ever been.   States she is doing keto diet now. She has tried several diets.  Binge eats with sweets and carbohydrates.   Sleeps well and wakes up feeling good. No hx of sleep apnea   She worked as a Scientific laboratory technician for years.  She is working on her masters in social work now   Hx of pancreatitis with gallstones and bile duct in 2015.  Denies family hx of thyroid cancer.   Divorced. 2 sons. 2 grandchildren.      08/19/2023    8:59 AM 08/19/2023    8:51 AM  Depression screen PHQ 2/9  Decreased Interest 0 0  Down, Depressed, Hopeless 0 0  PHQ - 2 Score 0 0  Altered sleeping 0   Tired, decreased energy 0   Change in appetite 0   Feeling bad or failure about yourself  0   Trouble concentrating 0   Moving slowly or fidgety/restless 0   Suicidal thoughts 0   PHQ-9 Score 0       Outpatient Encounter Medications as of 08/19/2023  Medication Sig   Apoaequorin (PREVAGEN EXTRA STRENGTH PO) Take by mouth.   ARIPiprazole (ABILIFY) 5 MG tablet Take 1 tablet (5 mg total) by mouth daily.   busPIRone (BUSPAR) 30 MG tablet Take 1 tablet (30 mg total) by mouth 2 (two) times daily.   clotrimazole (CLOTRIMAZOLE ANTI-FUNGAL) 1 % cream Apply 1 Application topically 2 (two) times daily.   escitalopram (LEXAPRO) 5 MG tablet  Take 3 tablets (15 mg total) by mouth daily.   mirtazapine (REMERON) 15 MG tablet Take 1 tablet (15 mg total) by mouth at bedtime.   Multiple Vitamins-Minerals (MULTIVITAMIN PO) Take 1 tablet by mouth daily.   tirzepatide (ZEPBOUND) 2.5 MG/0.5ML Pen Inject 2.5 mg into the skin once a week.   propranolol (INDERAL) 10 MG tablet Take 1 tablet (10 mg total) by mouth 2 (two) times daily as needed. (Patient not taking: Reported on 08/19/2023)   [DISCONTINUED] calcium-vitamin D (OSCAL WITH D) 250-125 MG-UNIT tablet Take 1 tablet by mouth daily.   [DISCONTINUED] omeprazole (PRILOSEC) 10 MG capsule Take 10 mg by mouth daily.   [DISCONTINUED] Rimegepant Sulfate (NURTEC) 75 MG TBDP Take 75 mg by mouth as needed (take 1 at onset of headache, max is 1 tablet in 24 hours).   No facility-administered encounter medications on file as of 08/19/2023.    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   Hyperlipidemia 08/18/2023   Menopause    Prediabetes 08/18/2023   Syncope     Past Surgical History:  Procedure Laterality Date   BACK SURGERY     cyst removal    BREAST SURGERY     breast reduction   BUNIONECTOMY     CHOLECYSTECTOMY     KNEE ARTHROSCOPY     SMALL INTESTINE SURGERY     SPINE SURGERY      Family History  Problem Relation Age of Onset   Heart disease Mother    Heart disease Brother    Heart disease Maternal Grandmother    Heart disease Maternal Grandfather    Cancer Son        unknown   ADD / ADHD Son    Depression Son    ADD / ADHD Son    Cancer Son     Social History   Socioeconomic History   Marital status: Significant Other    Spouse name: Not on file   Number of children: 2   Years of education: Masters    Highest education level: Master's degree (e.g., MA, MS, MEng, MEd, MSW, MBA)  Occupational History   Not on file  Tobacco Use   Smoking status: Never   Smokeless tobacco: Never  Vaping Use   Vaping status: Never Used  Substance and Sexual Activity   Alcohol use: Yes    Alcohol/week: 1.0 standard drink of alcohol    Comment: daily   Drug use: No   Sexual activity: Not Currently    Partners: Male  Other Topics Concern   Not on file  Social History Narrative   Lives   Caffeine use:    Drinks 16oz caffeine drinks a day    Social Drivers of Corporate investment banker Strain: Low Risk  (08/17/2023)   Overall Financial Resource Strain (CARDIA)    Difficulty of Paying Living Expenses: Not very hard  Food Insecurity: No Food Insecurity (08/17/2023)   Hunger Vital Sign    Worried About Running Out of Food in the Last Year: Never true    Ran Out of Food in the Last Year: Never true  Transportation Needs: No Transportation Needs (08/17/2023)   PRAPARE - Administrator, Civil Service (Medical): No    Lack of Transportation (Non-Medical): No  Physical Activity: Unknown (08/17/2023)   Exercise Vital Sign    Days of Exercise per Week: 0 days    Minutes of Exercise per Session: Not on file  Stress: No Stress Concern Present (08/17/2023)   [employer] of Occupational Health - Occupational Stress Questionnaire    Feeling of Stress : Only a little  Social Connections: Socially Isolated (08/17/2023)   Social Connection and Isolation Panel [NHANES]    Frequency of Communication with Friends and Family: Once a week    Frequency of Social Gatherings with Friends and Family: Once a week    Attends Religious Services: Never    Database administrator or Organizations: No    Attends Engineer, structural: Not on file    Marital Status: Divorced  Catering manager Violence: Not on file    Review of Systems  Constitutional:  Negative for chills, fever, malaise/fatigue and  weight loss.  Respiratory:  Negative for shortness of breath.   Cardiovascular:  Positive for leg swelling. Negative for chest pain and palpitations.       Intermittent   Gastrointestinal:  Negative for abdominal  pain, constipation, diarrhea, nausea and vomiting.  Genitourinary:  Negative for dysuria, frequency and urgency.  Musculoskeletal:        3rd toe on left foot changing  Neurological:  Negative for dizziness, focal weakness and headaches.  Psychiatric/Behavioral:  Negative for depression and suicidal ideas. The patient is nervous/anxious. The patient does not have insomnia.         Objective    BP 110/78 (BP Location: Left Arm, Patient Position: Sitting)   Pulse 67   Temp 97.6 F (36.4 C) (Temporal)   Ht 5\' 5"  (1.651 m)   Wt 226 lb (102.5 kg)   SpO2 95%   BMI 37.61 kg/m   Physical Exam Constitutional:      General: She is not in acute distress.    Appearance: She is not ill-appearing.  HENT:     Mouth/Throat:     Mouth: Mucous membranes are moist.  Eyes:     Extraocular Movements: Extraocular movements intact.     Conjunctiva/sclera: Conjunctivae normal.  Cardiovascular:     Rate and Rhythm: Normal rate and regular rhythm.  Pulmonary:     Effort: Pulmonary effort is normal.     Breath sounds: Normal breath sounds.  Musculoskeletal:     Cervical back: Normal range of motion and neck supple.     Right lower leg: No edema.     Left lower leg: No edema.  Skin:    General: Skin is warm and dry.     Findings: Rash present.     Comments: Erythema along pannus. No sign of infection.   Neurological:     General: No focal deficit present.     Mental Status: She is alert and oriented to person, place, and time.  Psychiatric:        Mood and Affect: Mood normal.        Behavior: Behavior normal.        Thought Content: Thought content normal.         Assessment & Plan:   Problem List Items Addressed This Visit     Anxiety   Hyperlipidemia - Primary    Relevant Medications   tirzepatide (ZEPBOUND) 2.5 MG/0.5ML Pen   Other Relevant Orders   EKG 12-Lead   Lipid panel (Completed)   Prediabetes   Relevant Medications   tirzepatide (ZEPBOUND) 2.5 MG/0.5ML Pen   Other Relevant Orders   CBC (Completed)   Comprehensive metabolic panel with GFR (Completed)   Hemoglobin A1c (Completed)   TSH (Completed)   Other Visit Diagnoses       High risk medication use       Relevant Orders   EKG 12-Lead     Post-menopausal       Relevant Orders   Ambulatory referral to Gynecology     Obesity (BMI 30-39.9)       Relevant Medications   tirzepatide (ZEPBOUND) 2.5 MG/0.5ML Pen   Other Relevant Orders   Amb Ref to Medical Weight Management   CBC (Completed)   Comprehensive metabolic panel with GFR (Completed)   Hemoglobin A1c (Completed)   Lipid panel (Completed)   TSH (Completed)     Hammertoe of left foot       Relevant Orders   Ambulatory referral to Podiatry     History of bunionectomy         Intertrigo       Relevant Medications   clotrimazole (CLOTRIMAZOLE ANTI-FUNGAL) 1 % cream      She is a pleasant  68 year old female who is here to establish care.  Under the care of psychiatrist and psychologist for mental health.  Referral to podiatry for hammertoe of left foot and hx of bunionectomy  Intertrigo - discussed keeping the area dry. Use antifungal twice daily for the next 2-4 weeks. No infection.  Obesity- she has a long hx of dieting but cannot lose weight. Referral to [facility]. Zepbound prescribed.  Check labs for prediabetes and HLD.  EKG done per request from psychiatrist due to high risk medication.  EKG shows NSR, rate 66, poor R wave progression unchanged from 2016 EKG, non specific T wave change  Follow up pending labs or in 6 weeks.   Return in about 6 weeks (around 09/30/2023) for chronic health conditions.   Alyson Back, NP-C

## 2023-08-19 NOTE — Progress Notes (Signed)
 Sharon Cole is a 68 y.o. female patient   08/19/2023  Treatment Plan: Diagnosis 296.32 (Major depressive affective disorder, recurrent episode, moderate) [n/a]  300.02 (Generalized anxiety disorder) [n/a]  Symptoms Depressed or irritable mood. (Status: maintained) -- No Description Entered  Feelings of hopelessness, worthlessness, or inappropriate guilt. (Status: maintained) -- No Description Entered  Lack of energy. (Status: maintained) -- No Description Entered  Low self-esteem. (Status: maintained) -- No Description Entered  Medication Status compliance  Safety none  If Suicidal or Homicidal State Action Taken: unspecified  Current Risk: low Medications Abilify (Dosage: .25mg )  Buspar (Dosage: 30mg )  Citalopram (Dosage: 20mg )  Topomax (Dosage: unknown)  Objectives Related Problem: Recognize, accept, and cope with feelings of depression. Description: Identify and replace thoughts and beliefs that support depression. Target Date: 2024-04-13 Frequency: Daily Modality: individual Progress: 80%  Related Problem: Recognize, accept, and cope with feelings of depression. Description: Learn and implement behavioral strategies to overcome depression. Target Date: 2024-04-13 Frequency: Daily Modality: individual Progress: 75%  Related  Problem: Recognize, accept, and cope with feelings of depression. Description: Verbalize an understanding and resolution of current interpersonal problems. Target Date: 2024-04-13 Frequency: Daily Modality: individual Progress: 90%  Related Problem: Recognize, accept, and cope with feelings of depression. Description: Verbalize insight into how past relationships may be influencing current experiences with depression. Target Date: 2024-04-13 Frequency: Daily Modality: individual Progress: 90%  Client Response full compliance  Service Location Location, [address] [place], [place] [address]  Service Code cpt [phone]  Normalize/Reframe  Facilitate problem solving  Identify/label emotions  Validate/empathize  Emotion regulation skills  Self care activities  Lifestyle change (exercise, nutrition)  Self-monitoring  Identified an insight  Rationally challenge thoughts or beliefs/cognitive restructuring  Session notes:   Goals/Plan: Wants to work on being genuine and being satisfied with herself and develop stronger self-esteem. Also, would like to improve her primary relationship and have her interpersonal and romantic needs met. This goal is now met, as she had ended the relationship. Wants to continue self-care and maintain weight loss. Needs to develop strategy to manage relationship with her son [name], who struggles with mental health issues. Goal date 12-25. 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 Will also attempt to create a small, but satisfying social network with like-minded people. Goal date is 12-25.   Meds:Lexapro 15mg , Buspar, Abilify 5mg  , Remeron Patient agrees to video Caregility session and is aware of the limitations of this platform. She is at home and I am at my home office.   Sharon Cole has been helping take care of grandchildren. She says she is having a lot of anxiety at work and is not sure why. Saw her psychiatrist  yesterday and they added Propanolol. She has also been forgetting things, which is causing her some concerns. As a result, she is going to stop her daily vaping. She is disappointed that she was told her insurance will not pay for GLP 1. She had coffee with [name] and he told her how he spent his money.                                                                             Sharon Nash, PhD  Time: 1:15p-2:00p 45 minutes.

## 2023-08-19 NOTE — Patient Instructions (Signed)
 Please go downstairs for labs.

## 2023-08-26 ENCOUNTER — Ambulatory Visit (INDEPENDENT_AMBULATORY_CARE_PROVIDER_SITE_OTHER)

## 2023-08-26 ENCOUNTER — Ambulatory Visit (INDEPENDENT_AMBULATORY_CARE_PROVIDER_SITE_OTHER): Admitting: Podiatry

## 2023-08-26 ENCOUNTER — Encounter: Payer: Self-pay | Admitting: Podiatry

## 2023-08-26 ENCOUNTER — Ambulatory Visit (INDEPENDENT_AMBULATORY_CARE_PROVIDER_SITE_OTHER): Payer: Medicare Other | Admitting: Psychology

## 2023-08-26 DIAGNOSIS — M2042 Other hammer toe(s) (acquired), left foot: Secondary | ICD-10-CM | POA: Diagnosis not present

## 2023-08-26 DIAGNOSIS — M2012 Hallux valgus (acquired), left foot: Secondary | ICD-10-CM | POA: Diagnosis not present

## 2023-08-26 DIAGNOSIS — F411 Generalized anxiety disorder: Secondary | ICD-10-CM

## 2023-08-26 DIAGNOSIS — F331 Major depressive disorder, recurrent, moderate: Secondary | ICD-10-CM | POA: Diagnosis not present

## 2023-08-26 NOTE — Progress Notes (Signed)
 Sharon Cole is a 68 y.o. female patient   08/26/2023  Treatment Plan: Diagnosis 296.32 (Major depressive affective disorder, recurrent episode, moderate) [n/a]  300.02 (Generalized anxiety disorder) [n/a]  Symptoms Depressed or irritable mood. (Status: maintained) -- No Description Entered  Feelings of hopelessness, worthlessness, or inappropriate guilt. (Status: maintained) -- No Description Entered  Lack of energy. (Status: maintained) -- No Description Entered  Low self-esteem. (Status: maintained) -- No Description Entered  Medication Status compliance  Safety none  If Suicidal or Homicidal State Action Taken: unspecified  Current Risk: low Medications Abilify  (Dosage: .25mg )  Buspar  (Dosage: 30mg )  Citalopram (Dosage: 20mg )  Topomax (Dosage: unknown)  Objectives Related Problem: Recognize, accept, and cope with feelings of depression. Description: Identify and replace thoughts and beliefs that support depression. Target Date: 2024-04-13 Frequency: Daily Modality: individual Progress: 80%  Related Problem: Recognize, accept, and cope with feelings of depression. Description: Learn and implement behavioral strategies to overcome depression. Target Date: 2024-04-13 Frequency: Daily Modality:  individual Progress: 75%  Related Problem: Recognize, accept, and cope with feelings of depression. Description: Verbalize an understanding and resolution of current interpersonal problems. Target Date: 2024-04-13 Frequency: Daily Modality: individual Progress: 90%  Related Problem: Recognize, accept, and cope with feelings of depression. Description: Verbalize insight into how past relationships may be influencing current experiences with depression. Target Date: 2024-04-13 Frequency: Daily Modality: individual Progress: 90%  Client Response full compliance  Service Location Location, [address] [place], [place] [address]  Service Code cpt [phone]  Normalize/Reframe  Facilitate problem solving  Identify/label emotions  Validate/empathize  Emotion regulation skills  Self care activities  Lifestyle change (exercise, nutrition)  Self-monitoring  Identified an insight  Rationally challenge thoughts or beliefs/cognitive restructuring  Session notes:   Goals/Plan: Wants to work on being genuine and being satisfied with herself and develop stronger self-esteem. Also, would like to improve her primary relationship and have her interpersonal and romantic needs met. This goal is now met, as she had ended the relationship. Wants to continue self-care and maintain weight loss. Needs to develop strategy to manage relationship with her son Sharon Cole, who struggles with mental health issues. Goal date 12-25. 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 Will also attempt to create a small, but satisfying social network with like-minded people. Goal date is 12-25.   Meds:Lexapro  15mg , Buspar , Abilify  5mg  , Remeron  Patient agrees to video Caregility session and is aware of the limitations of this platform. She is at home and I am at my home office.   Sharon Cole says she is having some success with dieting having made some changes. She is feeling optimistic that  she can continue this success. Also, she is already preparing for the class she has to (again) retake. She is also exploring a "walking program" that is in the house. She saw podiatrist today and found out she needs surfery on her foot. She has a bunyan that is altering the shape of her foot and is having trouble fitting in to shoes.  She says she has reached out to her half brother Alease Hunter (biological father's son) who had no interest in a relationship with her or Ambrosio Junker in the past. This brother never knew that she and Ambrosio Junker existed until he was 11 when Ambrosio Junker reached out to him. Sharon Cole had never reached out to him before and told him that she was working on genogram and wanted some medical information. He responded with the information in very impersonal way in writing to her. Sharon Cole was a little disappointed in his response. Her father left when Sharon Cole was 42 and she never saw him again. He had no interest in ever seeing them again. Discussed her emotional reaction. Had no expectations and reports is not especially distressed.                                                                                  Jola Nash, PhD  Time: 4:10p-5:00p 50 minutes.

## 2023-08-27 NOTE — Progress Notes (Signed)
 Subjective:  Patient ID: Sharon Cole, female    DOB: 1955/06/28,  MRN: [id number] HPI Chief Complaint  Patient presents with   Foot Pain    1st MPJ and 3rd toe left - bunion and HT deformity x couple months, "I almost noticed a change overnight", callused area plantar forefoot, tries to wear wide soft shoes due to rubbing   New Patient (Initial Visit)    68 y.o. female presents with the above complaint.   ROS: Denies fever chills nausea mobic muscle aches pains calf pain back pain chest pain shortness of breath.  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   Hyperlipidemia 08/18/2023   Menopause    Prediabetes 08/18/2023   Syncope    Past Surgical History:  Procedure Laterality Date   BACK SURGERY     cyst removal    BREAST SURGERY     breast reduction   BUNIONECTOMY     CHOLECYSTECTOMY     KNEE ARTHROSCOPY     SMALL INTESTINE SURGERY     SPINE SURGERY      Current Outpatient Medications:    propranolol  (INDERAL ) 10 MG tablet, Take 1 tablet (10 mg total) by mouth 2 (two) times daily as needed., Disp: 60 tablet, Rfl: 2   Apoaequorin (PREVAGEN EXTRA STRENGTH PO), Take by mouth., Disp: , Rfl:    ARIPiprazole  (ABILIFY ) 5 MG tablet, Take 1 tablet (5 mg total) by mouth daily., Disp: 30 tablet, Rfl: 2   busPIRone  (BUSPAR ) 30 MG tablet, Take 1 tablet (30 mg total) by mouth 2 (two) times daily., Disp: 60 tablet, Rfl: 2   clotrimazole  (CLOTRIMAZOLE  ANTI-FUNGAL) 1 % cream, Apply 1 Application topically 2 (two) times daily., Disp: 30 g, Rfl: 0   escitalopram  (LEXAPRO ) 5 MG tablet, Take 3 tablets (15 mg total) by mouth daily., Disp: 90 tablet, Rfl: 2   mirtazapine  (REMERON ) 15 MG tablet, Take 1 tablet (15 mg total) by  mouth at bedtime., Disp: 30 tablet, Rfl: 2   Multiple Vitamins-Minerals (MULTIVITAMIN PO), Take 1 tablet by mouth daily., Disp: , Rfl:   No Known Allergies Review of Systems Objective:  There were no vitals filed for this visit.  General: Well developed, nourished, in no acute distress, alert and oriented x3   Dermatological: Skin is warm, dry and supple bilateral. Nails x 10 are well maintained; remaining integument appears unremarkable at this time. There are no open sores, no preulcerative lesions, no rash or signs of infection present.  Vascular: Dorsalis Pedis artery and Posterior Tibial artery pedal pulses are 2/4 bilateral with immedate capillary fill time. Pedal hair growth present. No varicosities and no lower extremity edema present bilateral.   Neruologic: Grossly intact via light touch bilateral. Vibratory intact via tuning fork bilateral. Protective threshold with Semmes Wienstein monofilament intact to all pedal sites bilateral. Patellar and Achilles deep tendon reflexes 2+ bilateral. No Babinski or clonus noted bilateral.   Musculoskeletal: No gross boney pedal deformities bilateral. No pain, crepitus, or limitation noted with foot and ankle range of motion bilateral. Muscular strength 5/5 in all groups tested bilateral.  Severe hallux valgus deformity of the left foot with what appears to be a forefoot adduction.  Previous first metatarsal phalangeal  joint surgery flexible hammertoe deformities for the most part #2 with 3 however they are considerably laterally displaced.  Hypermobility of the midfoot at the tarsometatarsal joints  Gait: Unassisted, Nonantalgic.    Radiographs:  Radiographs taken today demonstrate osseously mature individual with moderate to severe metatarsus adductus and hallux valgus deformity osteoarthritic changes of the midfoot with osteopenia hammertoe deformities #2 and #3 with complete near complete dislocation of the 2nd and 3rd metatarsal phalangeal  joints.  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 Michalene Agee in the near future for surgical consult for surgical consideration     Kalonji Zurawski T. [place], [place]

## 2023-09-02 ENCOUNTER — Ambulatory Visit (INDEPENDENT_AMBULATORY_CARE_PROVIDER_SITE_OTHER): Payer: Medicare Other | Admitting: Psychology

## 2023-09-02 DIAGNOSIS — F411 Generalized anxiety disorder: Secondary | ICD-10-CM | POA: Diagnosis not present

## 2023-09-02 DIAGNOSIS — F331 Major depressive disorder, recurrent, moderate: Secondary | ICD-10-CM | POA: Diagnosis not present

## 2023-09-02 NOTE — Progress Notes (Signed)
 Sharon Cole is a 68 y.o. female patient   09/02/2023  Treatment Plan: Diagnosis 296.32 (Major depressive affective disorder, recurrent episode, moderate) [n/a]  300.02 (Generalized anxiety disorder) [n/a]  Symptoms Depressed or irritable mood. (Status: maintained) -- No Description Entered  Feelings of hopelessness, worthlessness, or inappropriate guilt. (Status: maintained) -- No Description Entered  Lack of energy. (Status: maintained) -- No Description Entered  Low self-esteem. (Status: maintained) -- No Description Entered  Medication Status compliance  Safety none  If Suicidal or Homicidal State Action Taken: unspecified  Current Risk: low Medications Abilify  (Dosage: .25mg )  Buspar  (Dosage: 30mg )  Citalopram (Dosage: 20mg )  Topomax (Dosage: unknown)  Objectives Related Problem: Recognize, accept, and cope with feelings of depression. Description: Identify and replace thoughts and beliefs that support depression. Target Date: 2024-04-13 Frequency: Daily Modality: individual Progress: 80%  Related Problem: Recognize, accept, and cope with feelings of depression. Description: Learn and implement behavioral strategies to overcome depression. Target Date: 2024-04-13 Frequency:  Daily Modality: individual Progress: 75%  Related Problem: Recognize, accept, and cope with feelings of depression. Description: Verbalize an understanding and resolution of current interpersonal problems. Target Date: 2024-04-13 Frequency: Daily Modality: individual Progress: 90%  Related Problem: Recognize, accept, and cope with feelings of depression. Description: Verbalize insight into how past relationships may be influencing current experiences with depression. Target Date: 2024-04-13 Frequency: Daily Modality: individual Progress: 90%  Client Response full compliance  Service  Location Location, [address] [place], [place] [address]  Service Code cpt [phone]  Normalize/Reframe  Facilitate problem solving  Identify/label emotions  Validate/empathize  Emotion regulation skills  Self care activities  Lifestyle change (exercise, nutrition)  Self-monitoring  Identified an insight  Rationally challenge thoughts or beliefs/cognitive restructuring  Session notes:   Goals/Plan: Wants to work on being genuine and being satisfied with herself and develop stronger self-esteem. Also, would like to improve her primary relationship and have her interpersonal and romantic needs met. This goal is now met, as she had ended the relationship. Wants to continue self-care and maintain weight loss. Needs to develop strategy to manage relationship with her son Sharon Cole, who struggles with mental health issues. Goal date 12-25. 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 Will also attempt to create a small, but satisfying social network with like-minded people. Goal date is 12-25.   Meds:Lexapro  15mg , Buspar , Abilify  5mg  , Remeron  Patient agrees to video Caregility session and is aware of the limitations of this platform. She is at home and I am at my home office.   [name] has only a little over a week until end of quarter. She found out that she does not  have to re-apply to admission. She will get a break for 2 weeks and then back to taking her failed class over. Says she has not started to walk yet and has "no idea" why. She always feels pressed for time at the end of the week. She says she is sticking to the diet, but hasn't lost this week. She is not discouraged and is determined to move forward.  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                                                                                    Jola Nash, PhD  Time: 1:10p-2:00p 50 minutes.

## 2023-09-04 ENCOUNTER — Ambulatory Visit (INDEPENDENT_AMBULATORY_CARE_PROVIDER_SITE_OTHER): Admitting: Podiatry

## 2023-09-04 ENCOUNTER — Encounter: Payer: Self-pay | Admitting: Podiatry

## 2023-09-04 VITALS — Ht 65.0 in | Wt 226.0 lb

## 2023-09-04 DIAGNOSIS — Q66222 Congenital metatarsus adductus, left foot: Secondary | ICD-10-CM | POA: Diagnosis not present

## 2023-09-04 DIAGNOSIS — M2042 Other hammer toe(s) (acquired), left foot: Secondary | ICD-10-CM | POA: Diagnosis not present

## 2023-09-04 DIAGNOSIS — M2012 Hallux valgus (acquired), left foot: Secondary | ICD-10-CM

## 2023-09-04 DIAGNOSIS — E559 Vitamin D deficiency, unspecified: Secondary | ICD-10-CM

## 2023-09-04 DIAGNOSIS — M62462 Contracture of muscle, left lower leg: Secondary | ICD-10-CM

## 2023-09-04 NOTE — Progress Notes (Signed)
 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  History of Present Illness Sharon Cole "Sharon Cole" is a 68 year old female who presents with worsening foot deformity and discomfort. She was referred by Dr. Lara Plants for evaluation of her foot condition.  She has been experiencing foot discomfort for a long time, with a previous surgery performed around 1998. Currently, she experiences no pain due to wearing comfortable shoes, but there is a little pain when moving certain parts of her foot and slight discomfort in the arch.  She has a significant bunion, metatarsus adductus, and arthritis. The bunion has been progressively worsening, and she has noticed a new rigid hammer Cole on the third Cole, which has developed recently, leading to a callus that causes pain when walking barefoot.  She lives alone on the third floor but plans to stay with her son, who lives in a house with fewer stairs, during her recovery period. She has one son who is available to assist her post-surgery. 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  No major medical issues such as heart attack, stroke, kidney, or liver problems. No smoking history. No issues with osteoporosis, bone density, or vitamin D  deficiency. No gastric reflux or stomach ulcers.      Objective:    Physical Exam VASCULAR: DP and PT pulse palpable. Foot is warm and well-perfused. Capillary fill time is brisk. DERMATOLOGIC: Normal skin turgor, texture, and temperature. No open lesions, rashes, or ulcerations. NEUROLOGIC: Normal sensation to light touch and pressure. No  paresthesias. ORTHOPEDIC: Severe hallux valgus deformity with hypermobility of the first ray. Pain over MTP joint and medial eminence, first, second, and third tarsal metatarsal joints. Contracted semi-rigid hammer Cole of the third Cole. Significant gastrocnemius equinus.    No images are attached to the encounter.    Results RADIOLOGY Foot X-ray: Severe metatarsus adductus deformity with increased Engels angle, first intermetatarsal angle, hallux abductus angle, interphalangeal deformity, and second and third hammer Cole. Arthritic changes noted in the first, second, and third TMT and first MTP. (08/28/2023)   Assessment:   1. Congenital metatarsus adductus, left foot   2. Hav (hallux abducto valgus), left   3. Hammer Cole of left foot   4. Gastrocnemius equinus of left lower extremity   5. Vitamin D  deficiency      Plan:  Patient was evaluated and treated and all questions answered.  Assessment and Plan Assessment & Plan Severe hallux valgus deformity Chronic severe hallux valgus deformity with hypermobility of the first ray, significant anatomical misalignment, and associated arthritis. Previous surgery in 1998 was inadequate, addressing only symptoms without correcting structural issues.  Difficult to find comfortable shoe gear.  She does not have pain in sandal she is wearing currently but if she wears an enclosed shoes it becomes painful.  Her deformities also complicated by her severe metatarsus adductus as well as arthritic changes in developing in the 1st, 2nd and 3rd tarsometatarsal and first MTP joint.  Additionally she has a semirigid hammertoe that is on reducible and this rubs on the shoes if she is in close shoes.  Her gastrocnemius contracture  also contributes to the deformity and midfoot strain and arthritic changes.  She has exhausted nonsurgical treatment at this point and is interested in proceeding with surgery to correct the deformities.  We discussed all risk benefits  and potential complications including but not limited to pain, swelling, infection, scar, numbness which may be temporary or permanent, chronic pain, stiffness, nerve pain or damage, wound healing problems, bone healing problems including delayed or non-union.  All questions addressed.  Informed sent signed reviewed.  - Schedule surgery in a couple of months, coordinate with surgery scheduler for dates. - Ensure pre-operative evaluation of vitamin D  levels to support bone healing. - Plan for post-operative care including use of a knee scooter, walker, and other assistive devices as needed. - She will coordinate living arrangements for post-operative recovery, considering staying at son's house for initial recovery period.  I advised her to let me know which DME needs she will have such as knee scooter walker shower chair etc.   Surgical plan:  Procedure: - GSSC  Location: - Left second hammertoe correction first MTP fusion for 2nd and 3rd TMT fusion with corrective osteotomy to correct the metatarsus adductus, gastrocnemius recession and bone graft from the heel  Anesthesia plan: - General With regional block  Postoperative pain plan: - Tylenol 1000 mg every 6 hours, ibuprofen 600 mg every 6 hours, gabapentin 300 mg every 8 hours x5 days, oxycodone 5 mg 1-2 tabs every 6 hours only as needed  DVT prophylaxis: - ASA 325 mg twice daily  WB Restrictions / DME needs: - Nonweightbearing in splint postop     Return for after surgery.

## 2023-09-04 NOTE — Patient Instructions (Addendum)
  VISIT SUMMARY: Today, we discussed your worsening foot deformity and discomfort. You have a significant bunion, metatarsus adductus, arthritis, and a new rigid hammer toe on your third toe. We reviewed your medical history and living situation to plan for your upcoming surgery and recovery.  YOUR PLAN: -SEVERE HALLUX VALGUS DEFORMITY: This condition involves a severe misalignment of your big toe joint, causing a bunion and arthritis. We will perform a first MTPJ fusion with internal fixation and a bone graft from your left heel to correct this. Surgical risks include infection, nerve pain, and bone healing issues.  -SEVERE METATARSUS ADDUCTUS: This is a congenital condition where the metatarsal bones are turned inward, contributing to your bunion and arthritis. We will perform a fusion of the first, second, and third tarsometatarsal joints with corrective osteotomy and wedges. Surgical risks include infection, nerve pain, and bone healing issues.  -OSTEOARTHRITIS OF MIDFOOT AND FIRST MTP JOINT: This is chronic arthritis in your midfoot and big toe joint, worsened by your other foot deformities. We will address this with a first MTPJ fusion and tarsometatarsal fusion. Surgical risks include infection, nerve pain, and bone healing issues.  -HAMMER TOE OF THIRD TOE: This is a rigid deformity of your third toe, causing discomfort and callus formation. We will correct this with internal fixation. Surgical risks include infection, nerve pain, and bone healing issues.  -GASTROCNEMIUS EQUINUS: This is a tight calf muscle that increases pressure on your forefoot, worsening your foot deformities. We will perform a gastrocnemius recession to lengthen the calf muscle. Surgical risks include infection, nerve pain, and bone healing issues.  INSTRUCTIONS: We will schedule your surgery in a couple of months. Please coordinate with the surgery scheduler for dates. Before surgery, we need to evaluate your vitamin D   levels to support bone healing. Plan for post-operative care, including using a knee scooter, walker, and other assistive devices. Consider staying at your son's house during the initial recovery period. Follow-up appointments will be scheduled to monitor your healing and transition to weight-bearing.                      Contains text generated by Abridge.                                 Contains text generated by Abridge.

## 2023-09-09 ENCOUNTER — Ambulatory Visit (INDEPENDENT_AMBULATORY_CARE_PROVIDER_SITE_OTHER): Admitting: Psychology

## 2023-09-09 ENCOUNTER — Ambulatory Visit: Admitting: Podiatry

## 2023-09-09 DIAGNOSIS — F411 Generalized anxiety disorder: Secondary | ICD-10-CM

## 2023-09-09 DIAGNOSIS — F331 Major depressive disorder, recurrent, moderate: Secondary | ICD-10-CM | POA: Diagnosis not present

## 2023-09-09 NOTE — Progress Notes (Signed)
 Sharon Cole is a 68 y.o. female patient   09/09/2023  Treatment Plan: Diagnosis 296.32 (Major depressive affective disorder, recurrent episode, moderate) [n/a]  300.02 (Generalized anxiety disorder) [n/a]  Symptoms Depressed or irritable mood. (Status: maintained) -- No Description Entered  Feelings of hopelessness, worthlessness, or inappropriate guilt. (Status: maintained) -- No Description Entered  Lack of energy. (Status: maintained) -- No Description Entered  Low self-esteem. (Status: maintained) -- No Description Entered  Medication Status compliance  Safety none  If Suicidal or Homicidal State Action Taken: unspecified  Current Risk: low Medications Abilify  (Dosage: .25mg )  Buspar  (Dosage: 30mg )  Citalopram (Dosage: 20mg )  Topomax (Dosage: unknown)  Objectives Related Problem: Recognize, accept, and cope with feelings of depression. Description: Identify and replace thoughts and beliefs that support depression. Target Date: 2024-04-13 Frequency: Daily Modality: individual Progress: 80%  Related Problem: Recognize, accept, and cope with feelings of depression. Description: Learn and implement behavioral strategies to overcome depression. Target  Date: 2024-04-13 Frequency: Daily Modality: individual Progress: 75%  Related Problem: Recognize, accept, and cope with feelings of depression. Description: Verbalize an understanding and resolution of current interpersonal problems. Target Date: 2024-04-13 Frequency: Daily Modality: individual Progress: 90%  Related Problem: Recognize, accept, and cope with feelings of depression. Description: Verbalize insight into how past relationships may be influencing current experiences with depression. Target  Date: 2024-04-13 Frequency: Daily Modality: individual Progress: 90%  Client Response full compliance  Service Location Location, [address] [place], [place] [address]  Service Code cpt [phone]  Normalize/Reframe  Facilitate problem solving  Identify/label emotions  Validate/empathize  Emotion regulation skills  Self care activities  Lifestyle change (exercise, nutrition)  Self-monitoring  Identified an insight  Rationally challenge thoughts or beliefs/cognitive restructuring  Session notes:   Goals/Plan: Wants to work on being genuine and being satisfied with herself and develop stronger self-esteem. Also, would like to improve her primary relationship and have her interpersonal and romantic needs met. This goal is now met, as she had ended the relationship. Wants to continue self-care and maintain weight loss. Needs to develop strategy to manage relationship with her son Sharon Cole, who struggles with mental health issues. Goal date 12-25. 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 Will also attempt to create a small, but satisfying social network with like-minded people. Goal date is 12-25.   Meds:Lexapro  15mg , Buspar , Abilify  5mg  , Remeron  Patient agrees to video Caregility session and is aware of the limitations of this platform. She is at home and I am at my home office.   Sharon Cole says that she is feeling good, but frustrated with her  lack of weight loss. She plans to start exercising today and hopes that will jump start her weight loss. Her foot surgery is scheduled for August and she is concerned that she may need to take a quarter off. Work is the same, but she still has some anxiety while there. Takes meds which help, but does not completely resolve. The anxiety increased when she went part time. She feels it is because she is struggling with remembering routine and makes mistakes. We talked about ways to mitigate this anxiety, including writing a "procedures manual". Also, suggested that she be more specific about how to use her two week break that starts next week. She has not really prepared at this point.                                                                                       Jola Nash, PhD  Time: 4:10p-5:00p 50 minutes.

## 2023-09-16 ENCOUNTER — Ambulatory Visit: Payer: Medicare Other | Admitting: Psychology

## 2023-09-23 ENCOUNTER — Ambulatory Visit (INDEPENDENT_AMBULATORY_CARE_PROVIDER_SITE_OTHER): Admitting: Psychology

## 2023-09-23 DIAGNOSIS — F411 Generalized anxiety disorder: Secondary | ICD-10-CM | POA: Diagnosis not present

## 2023-09-23 DIAGNOSIS — F331 Major depressive disorder, recurrent, moderate: Secondary | ICD-10-CM | POA: Diagnosis not present

## 2023-09-23 NOTE — Progress Notes (Signed)
 Sharon Cole is a 67 y.o. female patient   09/23/2023  Treatment Plan: Diagnosis 296.32 (Major depressive affective disorder, recurrent episode, moderate) [n/a]  300.02 (Generalized anxiety disorder) [n/a]  Symptoms Depressed or irritable mood. (Status: maintained) -- No Description Entered  Feelings of hopelessness, worthlessness, or inappropriate guilt. (Status: maintained) -- No Description Entered  Lack of energy. (Status: maintained) -- No Description Entered  Low self-esteem. (Status: maintained) -- No Description Entered  Medication Status compliance  Safety none  If Suicidal or Homicidal State Action Taken: unspecified  Current Risk: low Medications Abilify  (Dosage: .25mg )  Buspar  (Dosage: 30mg )  Citalopram (Dosage: 20mg )  Topomax (Dosage: unknown)  Objectives Related Problem: Recognize, accept, and cope with feelings of depression. Description: Identify and replace thoughts and beliefs that support depression. Target Date: 2024-04-13 Frequency: Daily Modality: individual Progress: 80%  Related Problem: Recognize, accept, and cope with feelings of depression. Description: Learn and implement behavioral strategies  to overcome depression. Target Date: 2024-04-13 Frequency: Daily Modality: individual Progress: 75%  Related Problem: Recognize, accept, and cope with feelings of depression. Description: Verbalize an understanding and resolution of current interpersonal problems. Target Date: 2024-04-13 Frequency: Daily Modality: individual Progress: 90%  Related Problem: Recognize, accept, and cope with feelings of depression.  Description: Verbalize insight into how past relationships may be influencing current experiences with depression. Target Date: 2024-04-13 Frequency: Daily Modality: individual Progress: 90%  Client Response full compliance  Service Location Location, [address] [place], [place] [address]  Service Code cpt [id number]  Normalize/Reframe  Facilitate problem solving  Identify/label emotions  Validate/empathize  Emotion regulation skills  Self care activities  Lifestyle change (exercise, nutrition)  Self-monitoring  Identified an insight  Rationally challenge thoughts or beliefs/cognitive restructuring  Session notes:   Goals/Plan: Wants to work on being genuine and being satisfied with herself and develop stronger self-esteem. Also, would like to improve her primary relationship and have her interpersonal and romantic needs met. This goal is now met, as she had ended the relationship. Wants to continue self-care and maintain weight loss. Needs to develop strategy to manage relationship with her son [name], who struggles with mental health issues. Goal date 12-25. [name] is now wanting to create a new, fulfilling life and pursue her interest in art. Will also attempt to create a small, but satisfying social network with like-minded people. Goal date is 12-25.   Meds:Lexapro  15mg , Buspar , Abilify  5mg  , Remeron  Patient agrees to video Caregility session and is aware of the limitations of this platform. She is at home and I am at my home office.   [name] her daughter in law  had another bad episode of POTS and [name] had to go help out with her. She had a nice Mother's Day which was spent at [employer]. Chalice Colt did not show up as initially planned due to a "headache". She has gone on a very restrictive 900 cal/day diet. This is moving the scale after not losing any weight for 3 weeks on Keto. Talked to her about integrating exercise into her regular routine. She has some aches and pains (back and knees) so is unable to exercise right now. Hopes to add this later on. Spoke with brother and it was, as usual, hard for her. Discussed whether to address that with him, but she feels it best to simply restrict contact/communication, which has worked for her.                                                                                          Jola Nash, PhD  Time: 4:10p-5:00p 50 minutes.

## 2023-09-30 ENCOUNTER — Encounter: Payer: Self-pay | Admitting: Family Medicine

## 2023-09-30 ENCOUNTER — Ambulatory Visit (INDEPENDENT_AMBULATORY_CARE_PROVIDER_SITE_OTHER): Admitting: Family Medicine

## 2023-09-30 ENCOUNTER — Ambulatory Visit (INDEPENDENT_AMBULATORY_CARE_PROVIDER_SITE_OTHER): Payer: Medicare Other | Admitting: Psychology

## 2023-09-30 VITALS — BP 104/66 | HR 66 | Temp 97.6°F | Ht 65.0 in | Wt 224.0 lb

## 2023-09-30 DIAGNOSIS — F411 Generalized anxiety disorder: Secondary | ICD-10-CM

## 2023-09-30 DIAGNOSIS — E669 Obesity, unspecified: Secondary | ICD-10-CM

## 2023-09-30 DIAGNOSIS — Z8249 Family history of ischemic heart disease and other diseases of the circulatory system: Secondary | ICD-10-CM

## 2023-09-30 DIAGNOSIS — R7303 Prediabetes: Secondary | ICD-10-CM | POA: Diagnosis not present

## 2023-09-30 DIAGNOSIS — R9431 Abnormal electrocardiogram [ECG] [EKG]: Secondary | ICD-10-CM

## 2023-09-30 DIAGNOSIS — E7800 Pure hypercholesterolemia, unspecified: Secondary | ICD-10-CM

## 2023-09-30 DIAGNOSIS — F331 Major depressive disorder, recurrent, moderate: Secondary | ICD-10-CM | POA: Diagnosis not present

## 2023-09-30 DIAGNOSIS — K219 Gastro-esophageal reflux disease without esophagitis: Secondary | ICD-10-CM | POA: Diagnosis not present

## 2023-09-30 DIAGNOSIS — Z9889 Other specified postprocedural states: Secondary | ICD-10-CM

## 2023-09-30 NOTE — Progress Notes (Signed)
 Sharon Cole is a 68 y.o. female patient   09/30/2023  Treatment Plan: Diagnosis 296.32 (Major depressive affective disorder, recurrent episode, moderate) [n/a]  300.02 (Generalized anxiety disorder) [n/a]  Symptoms Depressed or irritable mood. (Status: maintained) -- No Description Entered  Feelings of hopelessness, worthlessness, or inappropriate guilt. (Status: maintained) -- No Description Entered  Lack of energy. (Status: maintained) -- No Description Entered  Low self-esteem. (Status: maintained) -- No Description Entered  Medication Status compliance  Safety none  If Suicidal or Homicidal State Action Taken: unspecified  Current Risk: low Medications Abilify  (Dosage: .25mg )  Buspar  (Dosage: 30mg )  Citalopram (Dosage: 20mg )  Topomax (Dosage: unknown)  Objectives Related Problem: Recognize, accept, and cope with feelings of depression. Description: Identify and replace thoughts and beliefs that support depression. Target Date: 2024-04-13 Frequency: Daily Modality: individual Progress: 80%  Related Problem: Recognize, accept, and cope with feelings of depression. Description: Learn and  implement behavioral strategies to overcome depression. Target Date: 2024-04-13 Frequency: Daily Modality: individual Progress: 75%  Related Problem: Recognize, accept, and cope with feelings of depression. Description: Verbalize an understanding and resolution of current interpersonal problems. Target Date: 2024-04-13 Frequency: Daily  Modality: individual Progress: 90%  Related Problem: Recognize, accept, and cope with feelings of depression. Description: Verbalize insight into how past relationships may be influencing current experiences with depression. Target Date: 2024-04-13 Frequency: Daily Modality: individual Progress: 90%  Client Response full compliance  Service Location Location, [address] [place], [place] [address]  Service Code cpt [phone]  Normalize/Reframe  Facilitate problem solving  Identify/label emotions  Validate/empathize  Emotion regulation skills  Self care activities  Lifestyle change (exercise, nutrition)  Self-monitoring  Identified an insight  Rationally challenge thoughts or beliefs/cognitive restructuring  Session notes:   Goals/Plan: Wants to work on being genuine and being satisfied with herself and develop stronger self-esteem. Also, would like to improve her primary relationship and have her interpersonal and romantic needs met. This goal is now met, as she had ended the relationship. Wants to continue self-care and maintain weight loss. Needs to develop strategy to manage relationship with her son Sharon Cole, who struggles with mental health issues. Goal date 12-25. [name] is now wanting to create a new, fulfilling life and pursue her interest in art. Will also attempt to create a small, but satisfying social network with like-minded people. Goal date is 12-25.   Meds:Lexapro  15mg , Buspar , Abilify  5mg  , Remeron  Patient agrees to video Caregility session and is aware of the limitations of this platform. She is at home and I am at my home office.    [name] saw her primary care doctor and was told there was an issue with her EKG. They want to refer her to a cardiologist. She has a positive family history for cardiac disease. She says "I could be a walking time bomb or things could be fine". He classes officially started yesterday. She has postings due tomorrow. She took one of the initial quizzes and got a 90. She is optimistic (cautiously) about school.                                                                                             Jola Nash, PhD  Time: 1:15p-2:00p 45 minutes.

## 2023-09-30 NOTE — Patient Instructions (Signed)
Dermatology offices  [facility]: [phone] Address: [address], [place], [place] [address] [place], [place] [address]  [facility]: Phone #: [phone] Address: [address], [place], [place] [address]  [facility]: Phone: [phone]  Address: [address], , [place] [address]  [facility] Address: [address], , [place] [address] Phone: [phone] 

## 2023-09-30 NOTE — Progress Notes (Signed)
 Subjective:     Patient ID: Sharon Cole, female    DOB: 1956/01/26, 68 y.o.   MRN: [id number]  Chief Complaint  Patient presents with   Medical Management of Chronic Issues    6 week f/u    HPI History of Present Illness         She is here for follow up on chronic health conditions.   GLP-1s are not covered. She has heard from [facility] but currently is not planning to schedule.   States she is doing her own dieting now.  Drinking 2 protein shakes per day and 2 protein bars. States she is taking vitamins. She used to work as a Health and safety inspector.   Hx of abnormal EKG with cardiac cath in [place], [place] [id number].  Family hx of cardiac disease in several family members.   Denies exertional chest pain. No palpitations, DOE, edema.   LDL 105.  A1c 5.7%    Upcoming appt with gynecology.    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   Hyperlipidemia 08/18/2023   Menopause    Prediabetes 08/18/2023   Syncope     Past Surgical History:  Procedure Laterality Date   BACK SURGERY     cyst removal    BREAST SURGERY     breast reduction   BUNIONECTOMY     CHOLECYSTECTOMY     KNEE ARTHROSCOPY     SMALL INTESTINE SURGERY     SPINE SURGERY      Family History  Problem Relation Age of Onset   Heart disease Mother    Heart disease Brother    Heart disease Maternal Grandmother    Heart  disease Maternal Grandfather    Cancer Son        unknown   ADD / ADHD Son    Depression Son    ADD / ADHD Son    Cancer Son     Social History   Socioeconomic History   Marital status: Significant Other    Spouse name: Not on file   Number of children: 2   Years of education: Masters   Highest education level: Master's degree (e.g., MA, MS, MEng, MEd, MSW, MBA)  Occupational History   Not on file  Tobacco Use   Smoking status: Never   Smokeless tobacco: Never  Vaping Use   Vaping status: Never Used  Substance and Sexual Activity   Alcohol use: Yes    Alcohol/week: 1.0 standard drink of alcohol    Comment: daily   Drug use: No   Sexual activity: Not Currently    Partners: Male  Other Topics Concern   Not on file  Social History Narrative   Lives   Caffeine use:    Drinks 16oz caffeine drinks a day  Social Drivers of Corporate investment banker Strain: Low Risk  (08/17/2023)   Overall Financial Resource Strain (CARDIA)    Difficulty of Paying Living Expenses: Not very hard  Food Insecurity: No Food Insecurity (08/17/2023)   Hunger Vital Sign    Worried About Running Out of Food in the Last Year: Never true    Ran Out of Food in the Last Year: Never true  Transportation Needs: No Transportation Needs (08/17/2023)   PRAPARE - Administrator, Civil Service (Medical): No    Lack of Transportation (Non-Medical): No  Physical Activity: Unknown (08/17/2023)   Exercise Vital Sign    Days of Exercise per Week: 0 days    Minutes of Exercise per Session: Not on file  Stress: No Stress Concern Present (08/17/2023)   [employer] of Occupational Health - Occupational Stress Questionnaire    Feeling of Stress : Only a little  Social Connections: Socially Isolated (08/17/2023)   Social Connection and Isolation Panel [NHANES]    Frequency of Communication with Friends and Family: Once a week    Frequency of Social Gatherings with Friends and Family: Once a week     Attends Religious Services: Never    Database administrator or Organizations: No    Attends Engineer, structural: Not on file    Marital Status: Divorced  Catering manager Violence: Not on file    Outpatient Medications Prior to Visit  Medication Sig Dispense Refill   Apoaequorin (PREVAGEN EXTRA STRENGTH PO) Take by mouth.     ARIPiprazole  (ABILIFY ) 5 MG tablet Take 1 tablet (5 mg total) by mouth daily. 30 tablet 2   busPIRone  (BUSPAR ) 30 MG tablet Take 1 tablet (30 mg total) by mouth 2 (two) times daily. 60 tablet 2   clotrimazole  (CLOTRIMAZOLE  ANTI-FUNGAL) 1 % cream Apply 1 Application topically 2 (two) times daily. 30 g 0   escitalopram  (LEXAPRO ) 5 MG tablet Take 3 tablets (15 mg total) by mouth daily. 90 tablet 2   mirtazapine  (REMERON ) 15 MG tablet Take 1 tablet (15 mg total) by mouth at bedtime. 30 tablet 2   Multiple Vitamins-Minerals (MULTIVITAMIN PO) Take 1 tablet by mouth daily.     propranolol  (INDERAL ) 10 MG tablet Take 1 tablet (10 mg total) by mouth 2 (two) times daily as needed. 60 tablet 2   No facility-administered medications prior to visit.    No Known Allergies  Review of Systems  Constitutional:  Negative for chills and fever.  Respiratory:  Negative for cough and shortness of breath.   Cardiovascular:  Negative for chest pain, palpitations and leg swelling.  Gastrointestinal:  Negative for abdominal pain, constipation, diarrhea, nausea and vomiting.  Genitourinary:  Negative for dysuria, frequency and urgency.  Neurological:  Negative for dizziness and focal weakness.       Objective:     Physical Exam Constitutional:      General: She is not in acute distress.    Appearance: She is not ill-appearing.  Eyes:     Extraocular Movements: Extraocular movements intact.     Conjunctiva/sclera: Conjunctivae normal.  Cardiovascular:     Rate and Rhythm: Normal rate.  Pulmonary:     Effort: Pulmonary effort is normal.  Musculoskeletal:      Cervical back: Normal range of motion and neck supple.  Skin:    General: Skin is warm and dry.  Neurological:     General: No focal deficit present.     Mental Status: She  is alert and oriented to person, place, and time.     Motor: No weakness.     Coordination: Coordination normal.     Gait: Gait normal.  Psychiatric:        Mood and Affect: Mood normal.        Behavior: Behavior normal.        Thought Content: Thought content normal.      BP 104/66 (BP Location: Left Arm, Patient Position: Sitting)   Pulse 66   Temp 97.6 F (36.4 C) (Temporal)   Ht 5\' 5"  (1.651 m)   Wt 224 lb (101.6 kg)   SpO2 95%   BMI 37.28 kg/m  Wt Readings from Last 3 Encounters:  09/30/23 224 lb (101.6 kg)  09/04/23 226 lb (102.5 kg)  08/19/23 226 lb (102.5 kg)       Assessment & Plan:   Problem List Items Addressed This Visit     GERD (gastroesophageal reflux disease)   Hyperlipidemia - Primary   Prediabetes   Other Visit Diagnoses       Obesity (BMI 30-39.9)         Family history of heart disease in brother       Relevant Orders   Ambulatory referral to Cardiology     History of cardiac cath       Relevant Orders   Ambulatory referral to Cardiology     Abnormal EKG       Relevant Orders   Ambulatory referral to Cardiology      Reviewed labs with patient from previous visit.  LDL 105.  A1c 5.7%.  She is currently eating a diet high in protein and approximately 900 cal.  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  She is taking vitamins. GERD currently not an issue. GLP-1's were not covered.  Declines to see [facility] for now. Upcoming foot surgery.  She will need to be cleared by cardiology. History of abnormal EKG, cardiac catheterization when living in [place]  10+ years ago.  Several family members with heart disease.  Currently asymptomatic. Referral to cardiology Follow-up here in 6 months or  sooner if needed.  I am having Sharon Laos "[name]" maintain her Multiple Vitamins-Minerals (MULTIVITAMIN PO), propranolol , ARIPiprazole , busPIRone , escitalopram , mirtazapine , Apoaequorin (PREVAGEN EXTRA STRENGTH PO), and clotrimazole .  No orders of the defined types were placed in this encounter.

## 2023-10-01 ENCOUNTER — Ambulatory Visit (HOSPITAL_BASED_OUTPATIENT_CLINIC_OR_DEPARTMENT_OTHER): Admitting: Student

## 2023-10-01 DIAGNOSIS — F411 Generalized anxiety disorder: Secondary | ICD-10-CM

## 2023-10-01 DIAGNOSIS — F331 Major depressive disorder, recurrent, moderate: Secondary | ICD-10-CM | POA: Diagnosis not present

## 2023-10-01 MED ORDER — MIRTAZAPINE 15 MG PO TABS: ORAL_TABLET | ORAL | 1 refills | Status: DC

## 2023-10-01 MED ORDER — PROPRANOLOL HCL 20 MG PO TABS: 20.0000 mg | ORAL_TABLET | Freq: Three times a day (TID) | ORAL | 2 refills | Status: DC

## 2023-10-01 MED ORDER — BUSPIRONE HCL 30 MG PO TABS: 30.0000 mg | ORAL_TABLET | Freq: Two times a day (BID) | ORAL | 2 refills | Status: DC

## 2023-10-01 MED ORDER — PROPRANOLOL HCL 20 MG PO TABS: 20.0000 mg | ORAL_TABLET | Freq: Two times a day (BID) | ORAL | 2 refills | Status: DC | PRN

## 2023-10-01 MED ORDER — ARIPIPRAZOLE 5 MG PO TABS: 5.0000 mg | ORAL_TABLET | Freq: Every day | ORAL | 2 refills | Status: DC

## 2023-10-01 MED ORDER — ESCITALOPRAM OXALATE 5 MG PO TABS
15.0000 mg | ORAL_TABLET | Freq: Every day | ORAL | 2 refills | Status: DC
Start: 2023-10-01 — End: 2023-11-24

## 2023-10-01 NOTE — Progress Notes (Signed)
 BH MD Outpatient Progress Note  10/01/2023 4:03 PM [name]  MRN:  [id number]  Assessment:  Arnita Cole presents for follow-up evaluation in-person.  Today the patient reports continued frustration with anxiety.  On a positive note, the patient does report a significant reduction in the use of synthetic marijuana.  Plan to increase propranolol  and Remeron  as described below to address anxious symptoms.  The patient will follow-up with Dr. Adine Ahmadi in about 6 weeks.  Identifying Information: Sharon Cole is a 68 y.o. y.o. female with a history of generalized anxiety disorder and major depressive disorder, moderate, who is an established patient with [facility] for management of anxiety.   Plan:  # Generalized anxiety disorder  Hx major depressive disorder, moderate Interventions: - Increase propranolol  from 10 mg twice daily as needed to 20 mg twice daily as needed - Counseled again on the risk of orthostasis, and the patient does not have asthma -- Continue Lexapro  15 mg daily - Increase Remeron  from 15 mg nightly to 22.5 mg nightly for 7 days, then increase to 30 mg nightly - Continue Abilify  5 mg nightly for augmentation of antidepressant medications - Continue buspirone  30 mg twice daily - Continue therapy with Dr. Gutterman  # Cannabis use disorder Interventions: -- Patient has stated her interest in stopping synthetic marijuana use - Continue to engage patient motivational interviewing  #Long term use of antipsychotic medication -- Lipid panel from November 2024, LDL 125 - A1c from November 2024, 5.9 - EKG unremarkable 08/19/2023  Patient was given contact information for behavioral health clinic and was instructed to call 911 for emergencies.   Subjective:  Chief Complaint:  Chief Complaint  Patient presents with   Follow-up    Interval History:  Today the patient reports episodes of panic and severe anxiety primarily  triggered by events at her part-time job and difficulty with assignments and presentations at school.  She denies experiencing depression and she reports regular restful sleep.  She denies experiencing any suicidal thoughts.  She feels that she has decreased the usage of her vape pen by about 75%.  She says that she only uses it for recreational purposes, when previously she was using it to manage her anxiety.  Visit Diagnosis:    ICD-10-CM   1. GAD (generalized anxiety disorder)  F41.1 escitalopram  (LEXAPRO ) 5 MG tablet    mirtazapine  (REMERON ) 15 MG tablet    busPIRone  (BUSPAR ) 30 MG tablet    ARIPiprazole  (ABILIFY ) 5 MG tablet    propranolol  (INDERAL ) 20 MG tablet    DISCONTINUED: propranolol  (INDERAL ) 20 MG tablet    2. Moderate episode of recurrent major depressive disorder (HCC)  F33.1 escitalopram  (LEXAPRO ) 5 MG tablet    mirtazapine  (REMERON ) 15 MG tablet    busPIRone  (BUSPAR ) 30 MG tablet    ARIPiprazole  (ABILIFY ) 5 MG tablet      Past Psychiatric History: The patient denies any history of suicide attempts, no identifiable psychiatric hospitalizations in the medical record  Past Medical History:  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   Hyperlipidemia 08/18/2023   Menopause    Prediabetes 08/18/2023   Syncope     Past Surgical History:  Procedure Laterality Date   BACK SURGERY     cyst removal    BREAST SURGERY     breast reduction   BUNIONECTOMY     CHOLECYSTECTOMY     KNEE ARTHROSCOPY     SMALL INTESTINE SURGERY     SPINE SURGERY      Family Psychiatric History: None pertinent  Family History:  Family History  Problem Relation Age of Onset   Heart disease Mother    Heart disease Brother     Heart disease Maternal Grandmother    Heart disease Maternal Grandfather    Cancer Son        unknown   ADD / ADHD Son    Depression Son    ADD / ADHD Son    Cancer Son     Social History:  Social History   Socioeconomic History   Marital status: Significant Other    Spouse name: Not on file   Number of children: 2   Years of education: Masters   Highest education level: Master's degree (e.g., MA, MS, MEng, MEd, MSW, MBA)  Occupational History   Not on file  Tobacco Use   Smoking status: Never   Smokeless tobacco: Never  Vaping Use   Vaping status: Never Used  Substance and Sexual Activity   Alcohol use: Yes    Alcohol/week: 1.0 standard drink of alcohol    Comment: daily   Drug use: No   Sexual activity: Not Currently    Partners: Male  Other Topics Concern   Not on file  Social History Narrative   Lives   Caffeine use:    Drinks 16oz caffeine drinks a day    Social Drivers of Corporate investment banker Strain: Low Risk  (08/17/2023)   Overall Financial Resource Strain (CARDIA)    Difficulty of Paying Living Expenses: Not very hard  Food Insecurity: No Food Insecurity (08/17/2023)   Hunger Vital Sign    Worried About Running Out of Food in the Last Year: Never true    Ran Out of Food in the Last Year: Never true  Transportation Needs: No Transportation Needs (08/17/2023)   PRAPARE - Administrator, Civil Service (Medical): No    Lack of Transportation (Non-Medical): No  Physical Activity: Unknown (08/17/2023)   Exercise Vital Sign    Days of Exercise per Week: 0 days    Minutes of Exercise per Session: Not on file  Stress: No Stress Concern Present (08/17/2023)   [employer] of Occupational Health - Occupational Stress Questionnaire    Feeling of Stress : Only a little  Social Connections: Socially Isolated (08/17/2023)   Social Connection and Isolation Panel [NHANES]    Frequency of Communication with Friends and Family: Once a week     Frequency of Social Gatherings with Friends and Family: Once a week    Attends Religious Services: Never    Database administrator or Organizations: No    Attends Engineer, structural: Not on file    Marital Status: Divorced    Allergies: No Known Allergies  Current Medications: Current Outpatient Medications  Medication Sig Dispense Refill   Apoaequorin (PREVAGEN EXTRA STRENGTH PO) Take by mouth.     ARIPiprazole  (ABILIFY ) 5 MG tablet Take 1 tablet (5 mg total) by mouth daily. 30 tablet 2   busPIRone  (BUSPAR ) 30 MG tablet Take 1 tablet (30 mg total) by mouth 2 (two) times daily. 60 tablet 2   clotrimazole  (CLOTRIMAZOLE  ANTI-FUNGAL)  1 % cream Apply 1 Application topically 2 (two) times daily. 30 g 0   escitalopram  (LEXAPRO ) 5 MG tablet Take 3 tablets (15 mg total) by mouth daily. 90 tablet 2   mirtazapine  (REMERON ) 15 MG tablet Take 1.5 tablets (22.5 mg total) by mouth at bedtime for 7 days, THEN 2 tablets (30 mg total) at bedtime. 71 tablet 1   Multiple Vitamins-Minerals (MULTIVITAMIN PO) Take 1 tablet by mouth daily.     propranolol  (INDERAL ) 20 MG tablet Take 1 tablet (20 mg total) by mouth 2 (two) times daily as needed. 60 tablet 2   No current facility-administered medications for this visit.     Objective:  Psychiatric Specialty Exam: Physical Exam Constitutional:      Appearance: the patient is not toxic-appearing.  Pulmonary:     Effort: Pulmonary effort is normal.  Neurological:     General: No focal deficit present.     Mental Status: the patient is alert and oriented to person, place, and time.   Review of Systems  Respiratory:  Negative for shortness of breath.   Cardiovascular:  Negative for chest pain.  Gastrointestinal:  Negative for abdominal pain, constipation, diarrhea, nausea and vomiting.  Neurological:  Negative for headaches.      There were no vitals taken for this visit.  General Appearance: Fairly Groomed  Eye Contact:  Good  Speech:   Clear and Coherent  Volume:  Normal  Mood:  Euthymic  Affect: congruent  Thought Process:  Coherent  Orientation:  Full (Time, Place, and Person)  Thought Content: Logical   Suicidal Thoughts:  No  Homicidal Thoughts:  No  Memory:  Immediate;   Good  Judgement:  fair  Insight:  fair  Psychomotor Activity:  Normal  Concentration:  Concentration: Good  Recall:  Good  Fund of Knowledge: Good  Language: Good  Akathisia:  No  Handed:    AIMS (if indicated): not done  Assets:  Communication Skills Desire for Improvement Financial Resources/Insurance Housing Leisure Time Physical Health  ADL's:  Intact  Cognition: WNL  Sleep:  Fair     Metabolic Disorder Labs: Lab Results  Component Value Date   HGBA1C 5.7 08/19/2023   No results found for: "PROLACTIN" Lab Results  Component Value Date   CHOL 184 08/19/2023   TRIG 118.0 08/19/2023   HDL 55.80 08/19/2023   CHOLHDL 3 08/19/2023   VLDL 23.6 08/19/2023   LDLCALC 105 (H) 08/19/2023   LDLCALC 125 (H) 03/25/2023   Lab Results  Component Value Date   TSH 2.30 08/19/2023   TSH 1.760 02/05/2022    Therapeutic Level Labs: No results found for: "LITHIUM" No results found for: "VALPROATE" No results found for: "CBMZ"  Screenings: PHQ2-9    Flowsheet Row Office Visit from 08/19/2023 in [facility] [place]  PHQ-2 Total Score 0  PHQ-9 Total Score 0       Collaboration of Care: none  A total of 30 minutes was spent involved in face to face clinical care, chart review, documentation.   Marilou Showman, MD 10/01/2023, 4:03 PM

## 2023-10-06 ENCOUNTER — Other Ambulatory Visit (HOSPITAL_COMMUNITY)
Admission: RE | Admit: 2023-10-06 | Discharge: 2023-10-06 | Disposition: A | Discharge status: Home / Self Care | Source: Ambulatory Visit | Attending: Obstetrics and Gynecology | Admitting: Obstetrics and Gynecology

## 2023-10-06 ENCOUNTER — Other Ambulatory Visit: Payer: Self-pay | Admitting: Obstetrics and Gynecology

## 2023-10-06 ENCOUNTER — Encounter: Payer: Self-pay | Admitting: Obstetrics and Gynecology

## 2023-10-06 ENCOUNTER — Ambulatory Visit (INDEPENDENT_AMBULATORY_CARE_PROVIDER_SITE_OTHER): Admitting: Obstetrics and Gynecology

## 2023-10-06 VITALS — BP 94/62 | HR 65 | Ht 65.25 in | Wt 220.0 lb

## 2023-10-06 DIAGNOSIS — Z1231 Encounter for screening mammogram for malignant neoplasm of breast: Secondary | ICD-10-CM

## 2023-10-06 DIAGNOSIS — Z1151 Encounter for screening for human papillomavirus (HPV): Secondary | ICD-10-CM | POA: Diagnosis not present

## 2023-10-06 DIAGNOSIS — A6004 Herpesviral vulvovaginitis: Secondary | ICD-10-CM | POA: Diagnosis not present

## 2023-10-06 DIAGNOSIS — E2839 Other primary ovarian failure: Secondary | ICD-10-CM | POA: Diagnosis not present

## 2023-10-06 DIAGNOSIS — Z1331 Encounter for screening for depression: Secondary | ICD-10-CM | POA: Diagnosis not present

## 2023-10-06 DIAGNOSIS — Z01419 Encounter for gynecological examination (general) (routine) without abnormal findings: Secondary | ICD-10-CM | POA: Diagnosis present

## 2023-10-06 DIAGNOSIS — Z9189 Other specified personal risk factors, not elsewhere classified: Secondary | ICD-10-CM

## 2023-10-06 DIAGNOSIS — Z124 Encounter for screening for malignant neoplasm of cervix: Secondary | ICD-10-CM | POA: Insufficient documentation

## 2023-10-06 DIAGNOSIS — Z13820 Encounter for screening for osteoporosis: Secondary | ICD-10-CM

## 2023-10-06 MED ORDER — VALACYCLOVIR HCL 500 MG PO TABS: 500.0000 mg | ORAL_TABLET | Freq: Two times a day (BID) | ORAL | 3 refills | Status: AC

## 2023-10-06 NOTE — Progress Notes (Signed)
 68 y.o. [id number] postmenopausal female here for annual exam-high risk medicare. Significant Other. Works for [employer], Chartered certified accountant in Social work. 2 sons- oldest had 2 grandsons (13, 41yr)  Some urinary leakage with cough- mild, occasional. History of cold sores. No history of genital HSV  Postmenopausal bleeding: none Pelvic discharge or pain: none Breast mass, nipple discharge or skin changes : none Last PAP: No results found for: "DIAGPAP", "HPVHIGH", "ADEQPAP"  2015 NIL, HPV neg Last mammogram: 2015 Last DXA: never  Last colonoscopy: 2018 Sexually active: no  Exercising: no Smoker:no  Flowsheet Row Office Visit from 10/06/2023 in [facility]  PHQ-2 Total Score 0       Flowsheet Row Office Visit from 08/19/2023 in [facility] [place]  PHQ-9 Total Score 0        GYN HISTORY: No significant history  OB History  Gravida Para Term Preterm AB Living  4    2 2   SAB IAB Ectopic Multiple Live Births  2        # Outcome Date GA Lbr Len/2nd Weight Sex Type Anes PTL Lv  4 Gravida           3 Gravida           2 SAB           1 SAB            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      ARIPiprazole  (ABILIFY ) 5 MG tablet Take 1 tablet (5 mg total) by mouth daily. 30 tablet 2   busPIRone  (BUSPAR ) 30 MG tablet Take 1 tablet (30 mg total) by mouth 2 (two) times daily. 60 tablet 2   clotrimazole  (CLOTRIMAZOLE  ANTI-FUNGAL) 1 % cream Apply 1 Application topically 2 (two) times daily. 30 g 0   escitalopram  (LEXAPRO ) 5 MG tablet Take 3 tablets (15 mg total) by mouth daily. 90 tablet 2   mirtazapine  (REMERON ) 15 MG tablet Take 1.5 tablets (22.5 mg total) by mouth at bedtime for 7 days, THEN 2 tablets (30 mg total) at bedtime. 71 tablet 1   Multiple Vitamins-Minerals (MULTIVITAMIN PO) Take 1 tablet by mouth daily.     propranolol  (INDERAL ) 20 MG tablet Take 1 tablet (20 mg total) by mouth 2 (two) times daily as needed. 60 tablet 2   No current facility-administered medications on file prior to visit.   Social History   Socioeconomic History   Marital status: Significant Other    Spouse name: Not on file   Number of children: 2   Years of education:  Masters   Highest education level: Master's degree (e.g., MA, MS, MEng, MEd, MSW, MBA)  Occupational History   Not on file  Tobacco Use   Smoking status: Never   Smokeless tobacco: Never  Vaping Use   Vaping status: Never Used  Substance and Sexual Activity   Alcohol use: Yes    Alcohol/week: 1.0 standard drink of alcohol    Comment: daily   Drug use: No   Sexual activity: Not Currently    Partners: Male  Other Topics Concern   Not on file  Social History Narrative   Lives   Caffeine use:    Drinks 16oz caffeine drinks a day    Social Drivers of Corporate investment banker Strain: Low Risk  (08/17/2023)   Overall Financial Resource Strain (CARDIA)    Difficulty of Paying Living Expenses: Not very hard  Food Insecurity: No Food Insecurity (08/17/2023)   Hunger Vital Sign    Worried About Running Out of Food in the Last Year: Never true    Ran Out of Food in the Last Year: Never true  Transportation Needs: No  Transportation Needs (08/17/2023)   PRAPARE - Administrator, Civil Service (Medical): No    Lack of Transportation (Non-Medical): No  Physical Activity: Unknown (08/17/2023)   Exercise Vital Sign    Days of Exercise per Week: 0 days    Minutes of Exercise per Session: Not on file  Stress: No Stress Concern Present (08/17/2023)   [employer] of Occupational Health - Occupational Stress Questionnaire    Feeling of Stress : Only a little  Social Connections: Socially Isolated (08/17/2023)   Social Connection and Isolation Panel [NHANES]    Frequency of Communication with Friends and Family: Once a week    Frequency of Social Gatherings with Friends and Family: Once a week    Attends Religious Services: Never    Database administrator or Organizations: No    Attends Engineer, structural: Not on file    Marital Status: Divorced  Catering manager Violence: Not on file   Family History  Problem Relation Age of Onset   Heart disease Mother    Heart disease Brother    Heart disease Maternal Grandmother    Heart disease Maternal Grandfather    Cancer Son        unknown   ADD / ADHD Son    Depression Son    ADD / ADHD Son    Cancer Son    No Known Allergies    PE Today's Vitals   10/06/23 1130  BP: 94/62  Pulse: 65  SpO2: 96%  Weight: 220 lb (99.8 kg)  Height: 5' 5.25" (1.657 m)   Body mass index is 36.33 kg/m.  Physical Exam Vitals reviewed. Exam conducted with a chaperone present.  Constitutional:      General: She is not in acute distress.    Appearance: Normal appearance.  HENT:     Head: Normocephalic and atraumatic.     Nose: Nose normal.  Eyes:     Extraocular Movements: Extraocular movements intact.     Conjunctiva/sclera: Conjunctivae normal.  Neck:     Thyroid : No thyroid  mass, thyromegaly or thyroid  tenderness.  Pulmonary:     Effort: Pulmonary effort is normal.  Chest:     Chest wall: No mass or tenderness.  Breasts:    Right:  Normal. No swelling, mass, nipple discharge, skin change or tenderness.     Left: Normal.  No swelling, mass, nipple discharge, skin change or tenderness.  Abdominal:     General: There is no distension.     Palpations: Abdomen is soft.     Tenderness: There is no abdominal tenderness.  Genitourinary:    General: Normal vulva.     Exam position: Lithotomy position.     Urethra: No prolapse.     Vagina: Normal. No vaginal discharge or bleeding.     Cervix: Normal. No lesion.     Uterus: Normal. Not enlarged and not tender.      Adnexa: Right adnexa normal and left adnexa normal.       Comments: Ulcerated lesion of right perineum Kegel 3/5 Musculoskeletal:        General: Normal range of motion.     Cervical back: Normal range of motion.  Lymphadenopathy:     Upper Body:     Right upper body: No axillary adenopathy.     Left upper body: No axillary adenopathy.     Lower Body: No right inguinal adenopathy. No left inguinal adenopathy.  Skin:    General: Skin is warm and dry.  Neurological:     General: No focal deficit present.     Mental Status: She is alert.  Psychiatric:        Mood and Affect: Mood normal.        Behavior: Behavior normal.      Assessment and Plan:        Well woman exam with routine gynecological exam Assessment & Plan: Cervical cancer screening performed according to ASCCP guidelines- given 10-year gap in screening, will continue screening at this time. Encouraged annual mammogram screening- due, # provided Colonoscopy UTD DXA due Labs and immunizations with her primary Encouraged safe sexual practices as indicated Encouraged healthy lifestyle practices with diet and exercise For patients under 50-70yo, I recommend 1200mg  calcium daily and 600IU of vitamin D  daily.    Cervical cancer screening -     Cytology - PAP  Screening for osteoporosis -     DG Bone Density; Future  Negative depression screening  Herpes simplex vulvovaginitis -      valACYclovir HCl; Take 1 tablet (500 mg total) by mouth 2 (two) times daily for 3 days. With outbreaks  Dispense: 30 tablet; Refill: 3  Other specified personal risk factors, not elsewhere classified  Other primary ovarian failure -     DG Bone Density; Future   Romaine Closs, MD

## 2023-10-06 NOTE — Assessment & Plan Note (Signed)
 Cervical cancer screening performed according to ASCCP guidelines- given 10-year gap in screening, will continue screening at this time. Encouraged annual mammogram screening- due, # provided Colonoscopy UTD DXA due Labs and immunizations with her primary Encouraged safe sexual practices as indicated Encouraged healthy lifestyle practices with diet and exercise For patients under 50-67yo, I recommend 1200mg  calcium daily and 600IU of vitamin D  daily.

## 2023-10-06 NOTE — Patient Instructions (Signed)

## 2023-10-07 ENCOUNTER — Ambulatory Visit (INDEPENDENT_AMBULATORY_CARE_PROVIDER_SITE_OTHER): Admitting: Psychology

## 2023-10-07 DIAGNOSIS — F331 Major depressive disorder, recurrent, moderate: Secondary | ICD-10-CM | POA: Diagnosis not present

## 2023-10-07 DIAGNOSIS — F411 Generalized anxiety disorder: Secondary | ICD-10-CM | POA: Diagnosis not present

## 2023-10-07 NOTE — Progress Notes (Signed)
 Sharon Cole is a 68 y.o. female patient   10/07/2023  Treatment Plan: Diagnosis 296.32 (Major depressive affective disorder, recurrent episode, moderate) [n/a]  300.02 (Generalized anxiety disorder) [n/a]  Symptoms Depressed or irritable mood. (Status: maintained) -- No Description Entered  Feelings of hopelessness, worthlessness, or inappropriate guilt. (Status: maintained) -- No Description Entered  Lack of energy. (Status: maintained) -- No Description Entered  Low self-esteem. (Status: maintained) -- No Description Entered  Medication Status compliance  Safety none  If Suicidal or Homicidal State Action Taken: unspecified  Current Risk: low Medications Abilify  (Dosage: .25mg )  Buspar  (Dosage: 30mg )  Citalopram (Dosage: 20mg )  Topomax (Dosage: unknown)  Objectives Related Problem: Recognize, accept, and cope with feelings of depression. Description: Identify and replace thoughts and beliefs that support depression. Target Date: 2024-04-13 Frequency: Daily Modality: individual Progress: 80%  Related Problem: Recognize, accept, and cope with feelings of  depression. Description: Learn and implement behavioral strategies to overcome depression. Target Date: 2024-04-13 Frequency: Daily Modality: individual Progress: 75%  Related Problem: Recognize, accept, and cope with feelings of depression.  Description: Verbalize an understanding and resolution of current interpersonal problems. Target Date: 2024-04-13 Frequency: Daily Modality: individual Progress: 90%  Related Problem: Recognize, accept, and cope with feelings of depression. Description: Verbalize insight into how past relationships may be influencing current experiences with depression. Target Date: 2024-04-13 Frequency: Daily Modality: individual Progress: 90%  Client Response full compliance  Service Location Location, [address] [place], [place] [address]  Service Code cpt [id number]  Normalize/Reframe  Facilitate problem solving  Identify/label emotions  Validate/empathize  Emotion regulation skills  Self care activities  Lifestyle change (exercise, nutrition)  Self-monitoring  Identified an insight  Rationally challenge thoughts or beliefs/cognitive restructuring  Session notes:   Goals/Plan: Wants to work on being genuine and being satisfied with herself and develop stronger self-esteem. Also, would like to improve her primary relationship and have her interpersonal and romantic needs met. This goal is now met, as she had ended the relationship. Wants to continue self-care and maintain weight loss. Needs to develop strategy to manage relationship with her son Sharon Cole, who struggles with mental health issues. Goal date 12-25. 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 Will also attempt to create a small, but satisfying social network with like-minded people. Goal date is 12-25.   Meds:Lexapro  15mg , Buspar , Abilify  5mg  , Remeron  Patient agrees to video Caregility session and is aware of the limitations of this platform. She is  at home and I am at my home office.   Sharon Cole says she cannot see cardiologist until September about her abnormal EKG. Is on wait list for earlier appointment if one is available. She says school is tough and she is trying to improve on her performance from the first time she took the class. Meds have changed. Remeron  has moved from 15 mg to 30 mg. and Propanolol is now 20 mg. She sees psychiatry residents at [facility] and it keeps changing. She is going to try to get an attending doctor so she can have consistent care. Diet is the same and not really sustainable. She has not yet met with the Weight Loss program, and is resistant. She says, however, she will contact them to schedule an appointment so they can help her establish a plan. Her fear is that they will want her to consume more calories than she thinks will help her lose weight.                                                                                                Jola Nash, PhD  Time: 4:15p-5:00p 45 minutes.

## 2023-10-08 LAB — CYTOLOGY - PAP
Comment: NEGATIVE
Diagnosis: NEGATIVE
High risk HPV: NEGATIVE

## 2023-10-09 ENCOUNTER — Ambulatory Visit: Payer: Self-pay | Admitting: Obstetrics and Gynecology

## 2023-10-10 ENCOUNTER — Ambulatory Visit

## 2023-10-10 DIAGNOSIS — M21619 Bunion of unspecified foot: Secondary | ICD-10-CM | POA: Insufficient documentation

## 2023-10-10 DIAGNOSIS — M2040 Other hammer toe(s) (acquired), unspecified foot: Secondary | ICD-10-CM | POA: Insufficient documentation

## 2023-10-10 DIAGNOSIS — M7742 Metatarsalgia, left foot: Secondary | ICD-10-CM | POA: Insufficient documentation

## 2023-10-10 DIAGNOSIS — M216X2 Other acquired deformities of left foot: Secondary | ICD-10-CM | POA: Insufficient documentation

## 2023-10-14 ENCOUNTER — Ambulatory Visit

## 2023-10-14 ENCOUNTER — Ambulatory Visit (INDEPENDENT_AMBULATORY_CARE_PROVIDER_SITE_OTHER): Payer: Medicare Other | Admitting: Psychology

## 2023-10-14 DIAGNOSIS — F411 Generalized anxiety disorder: Secondary | ICD-10-CM

## 2023-10-14 DIAGNOSIS — F331 Major depressive disorder, recurrent, moderate: Secondary | ICD-10-CM | POA: Diagnosis not present

## 2023-10-14 NOTE — Progress Notes (Signed)
 Sharon Cole is a 68 y.o. female patient   10/14/2023  Treatment Plan: Diagnosis 296.32 (Major depressive affective disorder, recurrent episode, moderate) [n/a]  300.02 (Generalized anxiety disorder) [n/a]  Symptoms Depressed or irritable mood. (Status: maintained) -- No Description Entered  Feelings of hopelessness, worthlessness, or inappropriate guilt. (Status: maintained) -- No Description Entered  Lack of energy. (Status: maintained) -- No Description Entered  Low self-esteem. (Status: maintained) -- No Description Entered  Medication Status compliance  Safety none  If Suicidal or Homicidal State Action Taken: unspecified  Current Risk: low Medications Abilify  (Dosage: .25mg )  Buspar  (Dosage: 30mg )  Citalopram (Dosage: 20mg )  Topomax (Dosage: unknown)  Objectives Related Problem: Recognize, accept, and cope with feelings of depression. Description: Identify and replace thoughts and beliefs that support depression. Target Date: 2024-04-13 Frequency: Daily Modality: individual Progress: 80%  Related Problem: Recognize, accept, and  cope with feelings of depression. Description: Learn and implement behavioral strategies to overcome depression. Target Date: 2024-04-13 Frequency: Daily  Modality: individual Progress: 75%  Related Problem: Recognize, accept, and cope with feelings of depression. Description: Verbalize an understanding and resolution of current interpersonal problems. Target Date: 2024-04-13 Frequency: Daily Modality: individual Progress: 90%  Related Problem: Recognize, accept, and cope with feelings of depression. Description: Verbalize insight into how past relationships may be influencing current experiences with depression. Target Date: 2024-04-13 Frequency: Daily Modality: individual Progress: 90%  Client Response full compliance  Service Location Location, [address] [place], [place] [address]  Service Code cpt [phone]  Normalize/Reframe  Facilitate problem solving  Identify/label emotions  Validate/empathize  Emotion regulation skills  Self care activities  Lifestyle change (exercise, nutrition)  Self-monitoring  Identified an insight  Rationally challenge thoughts or beliefs/cognitive restructuring  Session notes:   Goals/Plan: Wants to work on being genuine and being satisfied with herself and develop stronger self-esteem. Also, would like to improve her primary relationship and have her interpersonal and romantic needs met. This goal is now met, as she had ended the relationship. Wants to continue self-care and maintain weight loss. Needs to develop strategy to manage relationship with her son Sharon Cole, who struggles with mental health issues. Goal date 12-25. [name] is now wanting to create a new, fulfilling life and pursue her interest in art. Will also attempt to create a small, but satisfying social network with like-minded people. Goal date is 12-25.   Meds:Lexapro  15mg , Buspar , Abilify  5mg  , Remeron  Patient agrees to video Caregility session and is aware of the limitations of  this platform. She is at home and I am at my home office.   Sharon Cole has had several difficult financial "hits" this past week. Significant dental work, then found out her school has her on probation and her loan is on hold. She is not sure if she would have to pay or if she can get the semester completed with better grades and have loan reinstated first. Thirdly, her car air conditioning broke and needs to be replaced. Has an appointment Thursday at [facility]. She is also going to schedule her foot surgery. She is managing not to panic and is making some well thought out decisions and avoiding careless error. Represents improvement under pressure.                                                                                                                                    Jola Nash, PhD  Time: 1:10p-2:00p 50 minutes.

## 2023-10-16 ENCOUNTER — Ambulatory Visit (INDEPENDENT_AMBULATORY_CARE_PROVIDER_SITE_OTHER): Admitting: Adult Health

## 2023-10-16 ENCOUNTER — Encounter (INDEPENDENT_AMBULATORY_CARE_PROVIDER_SITE_OTHER): Payer: Self-pay | Admitting: Adult Health

## 2023-10-16 VITALS — BP 103/71 | HR 99 | Temp 97.4°F | Ht 64.5 in | Wt 218.0 lb

## 2023-10-16 DIAGNOSIS — E669 Obesity, unspecified: Secondary | ICD-10-CM

## 2023-10-16 DIAGNOSIS — R7303 Prediabetes: Secondary | ICD-10-CM

## 2023-10-16 DIAGNOSIS — K219 Gastro-esophageal reflux disease without esophagitis: Secondary | ICD-10-CM

## 2023-10-16 DIAGNOSIS — Z6836 Body mass index (BMI) 36.0-36.9, adult: Secondary | ICD-10-CM

## 2023-10-16 DIAGNOSIS — E559 Vitamin D deficiency, unspecified: Secondary | ICD-10-CM

## 2023-10-16 DIAGNOSIS — F411 Generalized anxiety disorder: Secondary | ICD-10-CM | POA: Diagnosis not present

## 2023-10-16 DIAGNOSIS — Z0289 Encounter for other administrative examinations: Secondary | ICD-10-CM

## 2023-10-16 NOTE — Progress Notes (Signed)
 Office: [phone]  /  Fax: [phone]   Initial Visit    Sharon Cole was seen in clinic today to evaluate for obesity. She is interested in losing weight to improve overall health and reduce the risk of weight related complications. She presents today to review program treatment options, initial physical assessment, and evaluation.      She was referred by: PCP  When asked what else they would like to accomplish? She states: Adopt healthier eating patterns, Improve energy levels and physical activity, Improve existing medical conditions, Improve quality of life, and Current Weight 218 lbs, Goal Weight 140 lbs  When asked how has your weight affected you? She states: Contributed to medical problems, Contributed to orthopedic problems or mobility issues, Having fatigue, Having poor endurance, Problems with eating patterns, and Has affected mood   Weight history: She reports elevated BMI since her teenage years  Highest weight: 231 lbs  Some associated conditions: Hyperlipidemia and Prediabetes  Contributing factors: family history of obesity, disruption of circadian rhythm / sleep disordered breathing, consumption of processed foods, use of obesogenic medications: Beta-blockers and Psychotropic medications, moderate to high levels of stress, and eating patterns  Weight promoting medications identified: Beta-blockers and Psychotropic medications  Prior weight loss attempts: Ketogenic  Current nutrition plan: None  Current level of physical activity: None  Current or previous pharmacotherapy: Other: Stimulant Gtt's- given to her in her teenage years.  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  Body Fat%:46.8, Visceral Fat Rating:15, Weight trend over the last 12 months: Decreasing  General:  Alert, oriented and cooperative. Patient is in no acute distress.  Respiratory: Normal respiratory effort, no problems with respiration noted   Gait: able to ambulate independently  Mental Status: Normal mood and affect. Normal behavior. Normal judgment and thought content.   DIAGNOSTIC DATA REVIEWED:  BMET    Component Value Date/Time   NA 141 08/19/2023 0932   K 4.1 08/19/2023 0932   CL 106 08/19/2023 0932   CO2 28 08/19/2023 0932   GLUCOSE 104 (H) 08/19/2023 0932   BUN 13 08/19/2023 0932   CREATININE 0.85 08/19/2023 0932   CREATININE 0.60 12/22/2013 1045   CALCIUM 9.4 08/19/2023 0932   GFRNONAA >60 08/06/2016 1207   GFRAA >60 08/06/2016 1207   Lab Results  Component Value Date   HGBA1C 5.7 08/19/2023   HGBA1C 5.7 (H) 02/05/2022   No results found for: INSULIN CBC    Component Value Date/Time   WBC 7.3 08/19/2023 0932   RBC 5.28 (H) 08/19/2023 0932   HGB 14.4 08/19/2023 0932   HCT 44.1 08/19/2023 0932   PLT 215.0 08/19/2023 0932  MCV 83.5 08/19/2023 0932   MCH 27.6 08/06/2016 1207   MCHC 32.6 08/19/2023 0932   RDW 15.2 08/19/2023 0932   Iron/TIBC/Ferritin/ %Sat No results found for: IRON, TIBC, FERRITIN, IRONPCTSAT Lipid Panel     Component Value Date/Time   CHOL 184 08/19/2023 0932   CHOL 198 03/25/2023 1014   TRIG 118.0 08/19/2023 0932   HDL 55.80 08/19/2023 0932   HDL 55 03/25/2023 1014   CHOLHDL 3 08/19/2023 0932   VLDL 23.6 08/19/2023  0932   LDLCALC 105 (H) 08/19/2023 0932   LDLCALC 125 (H) 03/25/2023 1014   Hepatic Function Panel     Component Value Date/Time   PROT 6.8 08/19/2023 0932   ALBUMIN 4.4 08/19/2023 0932   AST 21 08/19/2023 0932   ALT 24 08/19/2023 0932   ALKPHOS 78 08/19/2023 0932   BILITOT 0.4 08/19/2023 0932   BILIDIR 0.0 02/13/2015 1542      Component Value Date/Time   TSH 2.30 08/19/2023 0932     Assessment and Plan   Vitamin D  deficiency  Gastroesophageal reflux disease, unspecified whether esophagitis present  Prediabetes  GAD (generalized anxiety disorder)  Obesity (BMI 30-39.9)., STARTING BMI 36.9   Assessment and Plan          ESTABLISH WITH [facility]   Obesity Treatment / Action Plan:  Patient will work on garnering support from family and friends to begin weight loss journey. Will work on eliminating or reducing the presence of highly palatable, calorie dense foods in the home. Will complete provided nutritional and psychosocial assessment questionnaire before the next appointment. Will be scheduled for indirect calorimetry to determine resting energy expenditure in a fasting state.  This will allow us  to create a reduced calorie, high-protein meal plan to promote loss of fat mass while preserving muscle mass. Will think about ideas on how to incorporate physical activity into their daily routine. Counseled on the health benefits of losing 5%-15% of total body weight. Was counseled on nutritional approaches to weight loss and benefits of reducing processed foods and consuming plant-based foods and high quality protein as part of nutritional weight management. Was counseled on pharmacotherapy and role as an adjunct in weight management.   Obesity Education Performed Today:  She was weighed on the bioimpedance scale and results were discussed and documented in the synopsis.  We discussed obesity as a disease and the importance of a more detailed evaluation of all the factors  contributing to the disease.  We discussed the importance of long term lifestyle changes which include nutrition, exercise and behavioral modifications as well as the importance of customizing this to her specific health and social needs.  We discussed the benefits of reaching a healthier weight to alleviate the symptoms of existing conditions and reduce the risks of the biomechanical, metabolic and psychological effects of obesity.  We reviewed the four pillars of obesity medicine and importance of using a multimodal approach.  We reviewed the basic principles in weight management.   Selia E Brunetti appears to be in the action stage of change and states they are ready to start intensive lifestyle modifications and behavioral modifications.  I have spent 30 minutes in the care of the patient today including: 5 minutes before the visit reviewing and preparing the chart. 20 minutes face-to-face assessing and reviewing listed medical problems as outlined in obesity care plan, providing nutritional and behavioral counseling on topics outlined in the obesity care plan, counseling regarding anti-obesity medication as outlined in obesity care plan, independently interpreting  test results and goals of care, as described in assessment and plan, and reviewing and discussing biometric information and progress 5 minutes after the visit updating chart and documentation of encounter.  Reviewed by clinician on day of visit: allergies, medications, problem list, medical history, surgical history, family history, social history, and previous encounter notes pertinent to obesity diagnosis.  Nakota Ackert d. Jayant Kriz, NP-C

## 2023-10-17 ENCOUNTER — Ambulatory Visit
Admission: RE | Admit: 2023-10-17 | Discharge: 2023-10-17 | Disposition: A | Discharge status: Home / Self Care | Source: Ambulatory Visit | Attending: Obstetrics and Gynecology

## 2023-10-17 DIAGNOSIS — Z1231 Encounter for screening mammogram for malignant neoplasm of breast: Secondary | ICD-10-CM

## 2023-10-20 ENCOUNTER — Other Ambulatory Visit

## 2023-10-20 ENCOUNTER — Telehealth: Payer: Self-pay | Admitting: Podiatry

## 2023-10-20 NOTE — Telephone Encounter (Signed)
 Pt left message stating she needs to cancel her surgery for end of July and it is a big procedure but she is in school and cannot get school and time off for surgery coordinated. She will call to r/s   I called pt and left message stating that I got her message but need to talk to her directly to get the surgery canceled. I asked for pt to call me back.

## 2023-10-20 NOTE — Telephone Encounter (Signed)
 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 She will call to r/s when she is able to work it out.  Notified cynthia at the surgery center to cancel surgery.

## 2023-10-21 ENCOUNTER — Ambulatory Visit: Admitting: Psychology

## 2023-10-21 DIAGNOSIS — F331 Major depressive disorder, recurrent, moderate: Secondary | ICD-10-CM | POA: Diagnosis not present

## 2023-10-21 DIAGNOSIS — F411 Generalized anxiety disorder: Secondary | ICD-10-CM

## 2023-10-21 NOTE — Progress Notes (Signed)
 Sharon Cole is a 68 y.o. female patient   10/21/2023  Treatment Plan: Diagnosis 296.32 (Major depressive affective disorder, recurrent episode, moderate) [n/a]  300.02 (Generalized anxiety disorder) [n/a]  Symptoms Depressed or irritable mood. (Status: maintained) -- No Description Entered  Feelings of hopelessness, worthlessness, or inappropriate guilt. (Status: maintained) -- No Description Entered  Lack of energy. (Status: maintained) -- No Description Entered  Low self-esteem. (Status: maintained) -- No Description Entered  Medication Status compliance  Safety none  If Suicidal or Homicidal State Action Taken: unspecified  Current Risk: low Medications Abilify  (Dosage: .25mg )  Buspar  (Dosage: 30mg )  Citalopram (Dosage: 20mg )  Topomax (Dosage: unknown)  Objectives Related Problem: Recognize, accept, and cope with feelings of depression. Description: Identify and replace thoughts and beliefs that support depression. Target Date: 2024-04-13 Frequency: Daily Modality: individual Progress: 80%  Related  Problem: Recognize, accept, and cope with feelings of  depression. Description: Learn and implement behavioral strategies to overcome depression. Target Date: 2024-04-13 Frequency: Daily Modality: individual Progress: 75%  Related Problem: Recognize, accept, and cope with feelings of depression. Description: Verbalize an understanding and resolution of current interpersonal problems. Target Date: 2024-04-13 Frequency: Daily Modality: individual Progress: 90%  Related Problem: Recognize, accept, and cope with feelings of depression. Description: Verbalize insight into how past relationships may be influencing current experiences with depression. Target Date: 2024-04-13 Frequency: Daily Modality: individual Progress: 90%  Client Response full compliance  Service Location Location, [address] [place], [place] [address]  Service Code cpt [phone]  Normalize/Reframe  Facilitate problem solving  Identify/label emotions  Validate/empathize  Emotion regulation skills  Self care activities  Lifestyle change (exercise, nutrition)  Self-monitoring  Identified an insight  Rationally challenge thoughts or beliefs/cognitive restructuring  Session notes:   Goals/Plan: Wants to work on being genuine and being satisfied with herself and develop stronger self-esteem. Also, would like to improve her primary relationship and have her interpersonal and romantic needs met. This goal is now met, as she had ended the relationship. Wants to continue self-care and maintain weight loss. Needs to develop strategy to manage relationship with her son [name], who struggles with mental health issues. Goal date 12-25. [name] is now wanting to create a new, fulfilling life and pursue her interest in art. Will also attempt to create a small, but satisfying social network with like-minded people. Goal date is 12-25.   Meds:Lexapro  15mg , Buspar , Abilify  5mg  , Remeron  Patient agrees to video Caregility session and  is aware of the limitations of this platform. She is at home and I am at my home office.   [name]'s birthday is today. She had spa treatments, lunch with brother and will have dinner with son [name]. Will have some difficult conversation with [name] at dinner. She says brother dominated discussion, talking about himself and his family. She has her foot surgery next week and is trying to get ahead at school. She requests that I send a letter to her school explaining her treatment to help her get her financial aid. Asked her to put request in writing to my office. Talked about her anxiety related to finances.                                                                                                                                      Jola Nash, PhD  Time: 4:10p-5:00p 50 minutes.

## 2023-10-22 ENCOUNTER — Ambulatory Visit: Payer: Self-pay | Admitting: Obstetrics and Gynecology

## 2023-10-28 ENCOUNTER — Ambulatory Visit: Payer: Medicare Other | Admitting: Psychology

## 2023-11-04 ENCOUNTER — Ambulatory Visit (INDEPENDENT_AMBULATORY_CARE_PROVIDER_SITE_OTHER): Admitting: Psychology

## 2023-11-04 DIAGNOSIS — F331 Major depressive disorder, recurrent, moderate: Secondary | ICD-10-CM | POA: Diagnosis not present

## 2023-11-04 DIAGNOSIS — F411 Generalized anxiety disorder: Secondary | ICD-10-CM | POA: Diagnosis not present

## 2023-11-04 NOTE — Progress Notes (Signed)
 Sharon Cole is a 68 y.o. female patient   11/04/2023  Treatment Plan: Diagnosis 296.32 (Major depressive affective disorder, recurrent episode, moderate) [n/a]  300.02 (Generalized anxiety disorder) [n/a]  Symptoms Depressed or irritable mood. (Status: maintained) -- No Description Entered  Feelings of hopelessness, worthlessness, or inappropriate guilt. (Status: maintained) -- No Description Entered  Lack of energy. (Status: maintained) -- No Description Entered  Low self-esteem. (Status: maintained) -- No Description Entered  Medication Status compliance  Safety none  If Suicidal or Homicidal State Action Taken: unspecified  Current Risk: low Medications Abilify  (Dosage: .25mg )  Buspar  (Dosage: 30mg )  Citalopram (Dosage: 20mg )  Topomax (Dosage: unknown)  Objectives Related Problem: Recognize, accept, and cope with feelings of depression. Description: Identify and replace thoughts and beliefs that support depression. Target Date: 2024-04-13 Frequency: Daily Modality: individual Progress: 80%  Related Problem: Recognize, accept, and cope with feelings of depression. Description: Learn and implement behavioral strategies to overcome depression. Target Date: 2024-04-13 Frequency: Daily Modality: individual Progress: 75%  Related Problem: Recognize, accept, and cope with feelings of depression. Description: Verbalize an understanding and resolution of current interpersonal problems. Target Date: 2024-04-13 Frequency: Daily Modality: individual Progress: 90%  Related Problem: Recognize, accept, and cope with feelings of depression. Description: Verbalize insight into how  past relationships may be influencing current experiences with depression. Target Date: 2024-04-13 Frequency: Daily Modality: individual Progress: 90%  Client Response full compliance  Service Location Location, [address] [place], [place] [address]  Service Code cpt [phone]  Normalize/Reframe  Facilitate problem solving  Identify/label emotions  Validate/empathize  Emotion regulation skills  Self care activities  Lifestyle change (exercise, nutrition)  Self-monitoring  Identified an insight  Rationally challenge thoughts or beliefs/cognitive restructuring  Session notes:   Goals/Plan: Wants to work on being genuine and being satisfied with herself and develop stronger self-esteem. Also, would like to improve her primary relationship and have her interpersonal and romantic needs met. This goal is now met, as she had ended the relationship. Wants to continue self-care and maintain weight loss. Needs to develop strategy to manage relationship with her son Sharon Cole, who struggles with mental health issues. Goal date 12-25. 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 Will also attempt to create a small, but satisfying social network with like-minded people. Goal date is 12-25.   Meds:Lexapro  15mg , Buspar , Abilify  5mg  , Remeron  Patient agrees to video Caregility session and is aware of the limitations of this platform. She is at home and I am at my home office.   Sharon Cole says that she had  her surgery last Tuesday. She is doing well and says that her pain has been very mild. She is in a boot and has a walker. She stayed at a hotel for a few days to avoid the steps at her house. She says that her kids have been helpful during recovery. She is in her 6th week at school, which is the point she had trouble in the previous trimester. We talked about her use of weed to manage her anxiety. Behaviorally, she tries to employ breathing techniques to help relax. Is  seeking an internship and it too is causing considerable anxiety.                                                                                                                                        Sharon Sharon Cole [name], PhD  Time: 4:10p-5:00p 50 minutes.

## 2023-11-10 ENCOUNTER — Ambulatory Visit: Admitting: Psychology

## 2023-11-11 ENCOUNTER — Ambulatory Visit (INDEPENDENT_AMBULATORY_CARE_PROVIDER_SITE_OTHER): Payer: Medicare Other | Admitting: Psychology

## 2023-11-11 DIAGNOSIS — F331 Major depressive disorder, recurrent, moderate: Secondary | ICD-10-CM

## 2023-11-11 DIAGNOSIS — F411 Generalized anxiety disorder: Secondary | ICD-10-CM

## 2023-11-11 NOTE — Progress Notes (Signed)
 Sharon Cole is a 67 y.o. female patient   11/11/2023  Treatment Plan: Diagnosis 296.32 (Major depressive affective disorder, recurrent episode, moderate) [n/a]  300.02 (Generalized anxiety disorder) [n/a]  Symptoms Depressed or irritable mood. (Status: maintained) -- No Description Entered  Feelings of hopelessness, worthlessness, or inappropriate guilt. (Status: maintained) -- No Description Entered  Lack of energy. (Status: maintained) -- No Description Entered  Low self-esteem. (Status: maintained) -- No Description Entered  Medication Status compliance  Safety none  If Suicidal or Homicidal State Action Taken: unspecified  Current Risk: low Medications Abilify  (Dosage: .25mg )  Buspar  (Dosage: 30mg )  Citalopram (Dosage: 20mg )  Topomax (Dosage: unknown)  Objectives Related Problem: Recognize, accept, and cope with feelings of depression. Description: Identify and replace thoughts and beliefs that support depression. Target Date: 2024-04-13 Frequency: Daily Modality: individual Progress: 80%  Related Problem: Recognize, accept, and cope with feelings of depression. Description: Learn and implement behavioral strategies to overcome depression. Target Date: 2024-04-13 Frequency: Daily Modality: individual Progress: 75%  Related Problem: Recognize, accept, and cope with feelings of depression. Description: Verbalize an understanding and resolution of current interpersonal problems. Target Date: 2024-04-13 Frequency: Daily Modality: individual Progress: 90%  Related Problem: Recognize, accept, and cope with feelings of depression. Description:  Verbalize insight into how past relationships may be influencing current experiences with depression. Target Date: 2024-04-13 Frequency: Daily Modality: individual Progress: 90%  Client Response full compliance  Service Location Location, [address] [place], [place] [address]  Service Code cpt [phone]  Normalize/Reframe  Facilitate problem solving  Identify/label emotions  Validate/empathize  Emotion regulation skills  Self care activities  Lifestyle change (exercise, nutrition)  Self-monitoring  Identified an insight  Rationally challenge thoughts or beliefs/cognitive restructuring  Session notes:   Goals/Plan: Wants to work on being genuine and being satisfied with herself and develop stronger self-esteem. Also, would like to improve her primary relationship and have her interpersonal and romantic needs met. This goal is now met, as she had ended the relationship. Wants to continue self-care and maintain weight loss. Needs to develop strategy to manage relationship with her son Sharon Cole, who struggles with mental health issues. Goal date 12-25. 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 Will also attempt to create a small, but satisfying social network with like-minded people. Goal date is 12-25.   Meds:Lexapro  15mg , Buspar , Abilify  5mg  , Remeron  Patient agrees to video Caregility session and is aware of the limitations of this platform. She is at  home and I am at my home office.   Sharon Cole says her recovery is going well except she developed sciatica in that leg, which should be temporary. She is in boot for another month. She thinks she has a potential placement at a dialysis clinic. Not yet confirmed with the school. She had a long, disturbing conversation with her brother. He is processing a number of personal issues that highlights for her, all of the ways that his life is superior to her life. He has many successes, while she feels like a  failure in many ways. We talked about disclosing to brother the emotional process and struggle she has with comparing herself to him.                                                                                                                                             CONI Sharon Cole [name], PhD  Time: 1:10p-2:00p 50 minutes.

## 2023-11-13 ENCOUNTER — Ambulatory Visit

## 2023-11-18 ENCOUNTER — Ambulatory Visit (INDEPENDENT_AMBULATORY_CARE_PROVIDER_SITE_OTHER): Admitting: Psychology

## 2023-11-18 DIAGNOSIS — F331 Major depressive disorder, recurrent, moderate: Secondary | ICD-10-CM | POA: Diagnosis not present

## 2023-11-18 DIAGNOSIS — F411 Generalized anxiety disorder: Secondary | ICD-10-CM

## 2023-11-18 NOTE — Progress Notes (Signed)
 Sharon Cole is a 68 y.o. female patient   11/18/2023  Treatment Plan: Diagnosis 296.32 (Major depressive affective disorder, recurrent episode, moderate) [n/a]  300.02 (Generalized anxiety disorder) [n/a]  Symptoms Depressed or irritable mood. (Status: maintained) -- No Description Entered  Feelings of hopelessness, worthlessness, or inappropriate guilt. (Status: maintained) -- No Description Entered  Lack of energy. (Status: maintained) -- No Description Entered  Low self-esteem. (Status: maintained) -- No Description Entered  Medication Status compliance  Safety none  If Suicidal or Homicidal State Action Taken: unspecified  Current Risk: low Medications Abilify  (Dosage: .25mg )  Buspar  (Dosage: 30mg )  Citalopram (Dosage: 20mg )  Topomax (Dosage: unknown)  Objectives Related Problem: Recognize, accept, and cope with feelings of depression. Description: Identify and replace thoughts and beliefs that support depression. Target Date: 2024-04-13 Frequency: Daily Modality: individual Progress: 80%  Related Problem: Recognize, accept, and cope with feelings of depression. Description: Learn and implement behavioral strategies to overcome depression. Target Date: 2024-04-13 Frequency: Daily Modality: individual Progress: 75%  Related Problem: Recognize, accept, and cope with feelings of depression. Description: Verbalize an understanding and resolution of current interpersonal problems. Target Date: 2024-04-13 Frequency: Daily Modality: individual Progress: 90%  Related Problem: Recognize, accept, and cope with  feelings of depression. Description: Verbalize insight into how past relationships may be influencing current experiences with depression. Target Date: 2024-04-13 Frequency: Daily Modality: individual Progress: 90%  Client Response full compliance  Service Location Location, [address] [place], [place] [address]  Service Code cpt [phone]  Normalize/Reframe  Facilitate problem solving  Identify/label emotions  Validate/empathize  Emotion regulation skills  Self care activities  Lifestyle change (exercise, nutrition)  Self-monitoring  Identified an insight  Rationally challenge thoughts or beliefs/cognitive restructuring  Session notes:   Goals/Plan: Wants to work on being genuine and being satisfied with herself and develop stronger self-esteem. Also, would like to improve her primary relationship and have her interpersonal and romantic needs met. This goal is now met, as she had ended the relationship. Wants to continue self-care and maintain weight loss. Needs to develop strategy to manage relationship with her son Sharon Cole, who struggles with mental health issues. Goal date 12-25. [name] is now wanting to create a new, fulfilling life and pursue her interest in art. Will also attempt to create a small, but satisfying social network with like-minded people. Goal date is 12-25.   Meds:Lexapro  15mg , Buspar , Abilify  5mg  , Remeron  Patient agrees to  video Caregility session and is aware of the limitations of this platform. She is at home and I am at my home office.   [name] that the placement she was hoping to secure, has not responded to her. She is feeling she is getting the run around. She is now back at work and it is very busy. Is keeping wp with both school and work, feeling optimistic. Is secure and confident about her boundaries and is committed to not enable her son, Sharon Cole.                                                                                                                                                  CONI Sharon Cole [name], PhD  Time: 4:10p-5:00p 50 minutes.

## 2023-11-19 ENCOUNTER — Ambulatory Visit (HOSPITAL_COMMUNITY): Admitting: Student in an Organized Health Care Education/Training Program

## 2023-11-21 ENCOUNTER — Ambulatory Visit

## 2023-11-21 VITALS — BP 110/68 | HR 64 | Ht 64.5 in | Wt 221.4 lb

## 2023-11-21 DIAGNOSIS — Z0000 Encounter for general adult medical examination without abnormal findings: Secondary | ICD-10-CM | POA: Diagnosis not present

## 2023-11-21 NOTE — Patient Instructions (Signed)
 Sharon Cole , Thank you for taking time out of your busy schedule to complete your Annual Wellness Visit with me. I enjoyed our conversation and look forward to speaking with you again next year. I, as well as your care team,  appreciate your ongoing commitment to your health goals. Please review the following plan we discussed and let me know if I can assist you in the future. Your Game plan/ To Do List   Follow up Visits: Next Medicare AWV with our clinical staff: 11/23/2024.   Have you seen your provider in the last 6 months (3 months if uncontrolled diabetes)? Yes Next Office Visit with your provider: Patient will call the office to schedule her next visit.  Last visit was 09/30/2023.  Clinician Recommendations:  Aim for 30 minutes of exercise or brisk walking, 6-8 glasses of water, and 5 servings of fruits and vegetables each day. You are due for a shingles vaccine and a pneumonia vaccine.  You can get those done at your local pharmacy.  You are also due for a Hep C screening and can get that during your next office visit here.  Get well soon.      This is a list of the screening recommended for you and due dates:  Health Maintenance  Topic Date Due   Medicare Annual Wellness Visit  Never done   Hepatitis C Screening  Never done   Zoster (Shingles) Vaccine (1 of 2) Never done   Pneumococcal Vaccine for age over 74 (1 of 1 - PCV) Never done   DEXA scan (bone density measurement)  Never done   COVID-19 Vaccine (4 - 2024-25 season) 01/05/2023   Flu Shot  12/05/2023   DTaP/Tdap/Td vaccine (2 - Td or Tdap) 01/01/2025   Mammogram  10/16/2025   Colon Cancer Screening  09/03/2026   Hepatitis B Vaccine  Aged Out   HPV Vaccine  Aged Out   Meningitis B Vaccine  Aged Out    Advanced directives: (Declined) Advance directive discussed with you today. Even though you declined this today, please call our office should you change your mind, and we can give you the proper paperwork for you to fill  out. Advance Care Planning is important because it:  [x]  Makes sure you receive the medical care that is consistent with your values, goals, and preferences  [x]  It provides guidance to your family and loved ones and reduces their decisional burden about whether or not they are making the right decisions based on your wishes.  Follow the link provided in your after visit summary or read over the paperwork we have mailed to you to help you started getting your Advance Directives in place. If you need assistance in completing these, please reach out to us  so that we can help you!  See attachments for Preventive Care and Fall Prevention Tips.

## 2023-11-21 NOTE — Progress Notes (Cosign Needed)
 [facility] MD Outpatient Progress Note  11/24/2023 11:51 AM Sharon Cole  MRN:  [id number]  Assessment:  Sharon Cole presents for follow-up evaluation in-person on 11/24/23 . Formally a patient of Dr. Schuyler, last seen on 10/01/2023.  Patient with established diagnoses of MDD and GAD presents for follow-up. Depression appears to be in remission, likely attributable to current regimen of Lexapro  and Abilify , which will be continued at the same doses. Persistent anxiety remains the primary concern. Buspirone  is already maximized at 30 mg BID without further benefit, and Remeron  at 30 mg nightly does not appear to be providing meaningful improvement in mood, sleep, or anxiety symptoms. Given this lack of efficacy and the potential for unnecessary polypharmacy, we will decrease Remeron  from 30 mg to 15 mg, with the plan to discontinue if no clinical benefit is observed. To address residual anxiety, we will initiate low-dose gabapentin , which the patient is agreeable to.   Identifying Information: Sharon Cole is a 68 y.o. female with a history of GAD, cannabis use disorder, MDD who is an established patient with [facility] for management of anxiety and depression. For a comprehensive history and detailed assessment, please refer to the initial adult assessment.  The patient's PMHx is significant for obesity, HLD, and prediabetes.   Plan:  # Generalized anxiety disorder  Hx major depressive disorder, moderate Interventions: -- START gabapentin  100 mg daily PRN - Continue propranolol  20 mg BID PRN  ---counseled on risk of orthostasis at this visit -- Continue Lexapro  15 mg daily - Decrease Remeron  from 30 mg to 15 mg nightly, with tentative goal of discounting due to poor response - Continue Abilify  5 mg nightly for augmentation of antidepressant medications - Continue buspirone  30 mg twice daily - Continue therapy with Dr. Gutterman   # Cannabis use disorder,  in early remission Interventions: -- Patient reports she quit 1 month ago   #Long term use of antipsychotic medication -- Lipid panel from November 2024, LDL 125 - A1c from November 2024, 5.9 - EKG unremarkable 08/19/2023    Patient was given contact information for behavioral health clinic and was instructed to call 911 for emergencies.   Subjective:  Chief Complaint:  Chief Complaint  Patient presents with   Anxiety   Follow-up    Interval History:  Since her last visit, the patient reports that her anxiety has not improved. She describes experiencing persistent anxious thoughts and physical symptoms, including palpitations, which propranolol  has helped to some extent. She notes mild dizziness as a side effect but states it has not been problematic. Despite an increase in Remeron , she has not noticed additional benefit for anxiety or sleep and reports it has not negatively impacted her appetite or improved sleep beyond her baseline.  She attributes her remission of depressive symptoms to Lexapro  and Abilify . 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 She enjoys her coursework despite academic stress.  Current stressors include her youngest son's significant emotional difficulties, which place a heavy emotional burden on her as she attempts to support him. She also reports anticipatory anxiety related to her job at [employer], describing the environment as obnoxious, though not hostile.  She continues to attend therapy sessions, which she finds helpful. She recalls Ativan  was effective in the past, though she has not used it in five years.  She currently wears a CAM boot following bunionectomy on 6/24, which  she reports has limited her ability to be active outside the home and reduced social interactions.  Sleep: Reports anxious thoughts interfere with falling asleep, but overall gets  8-9 hours of restorative sleep. Appetite: Good and stable. Caffeine: Two cups daily (one decaf). Cannabis: Quit one month ago to prepare for a field test; notes feeling more anxious since cessation. Recent substance use: Denies. Suicidal ideation: Denies.    Visit Diagnosis:    ICD-10-CM   1. GAD (generalized anxiety disorder)  F41.1 ARIPiprazole  (ABILIFY ) 5 MG tablet    escitalopram  (LEXAPRO ) 5 MG tablet    2. Recurrent major depressive disorder, in full remission (HCC)  F33.42 ARIPiprazole  (ABILIFY ) 5 MG tablet    escitalopram  (LEXAPRO ) 5 MG tablet       Social History: Lives in alone in [place] No pets 2 adult sons, she also has grandchildren Social Support: her children Insurance claims handler: No No military hx Firearms: Denies  Social History   Socioeconomic History   Marital status: Significant Other    Spouse name: Not on file   Number of children: 2   Years of education: Masters   Highest education level: Master's degree (e.g., MA, MS, MEng, MEd, MSW, MBA)  Occupational History   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  Tobacco Use   Smoking status: Never   Smokeless tobacco: Never  Vaping Use   Vaping status: Never Used  Substance and Sexual Activity   Alcohol use: Yes    Alcohol/week: 1.0 standard drink of alcohol    Types: 1 Standard drinks or equivalent per week   Drug use: No   Sexual activity: Not Currently    Partners: Male  Other Topics Concern   Not on file  Social History Narrative   Lives alone/2025   Caffeine use:    Drinks 16oz caffeine drinks a day    Social Drivers of Corporate investment banker Strain: Low Risk  (11/21/2023)   Overall Financial Resource Strain (CARDIA)    Difficulty of Paying Living Expenses: Not hard at all  Food Insecurity: No Food Insecurity (11/21/2023)   Hunger Vital Sign    Worried About Running Out of Food in the Last Year: Never true    Ran Out of Food in the Last Year: Never true  Transportation Needs:  No Transportation Needs (11/21/2023)   PRAPARE - Administrator, Civil Service (Medical): No    Lack of Transportation (Non-Medical): No  Physical Activity: Inactive (11/21/2023)   Exercise Vital Sign    Days of Exercise per Week: 0 days    Minutes of Exercise per Session: Not on file  Stress: No Stress Concern Present (11/21/2023)   [employer] of Occupational Health - Occupational Stress Questionnaire    Feeling of Stress: Only a little  Social Connections: Socially Isolated (11/21/2023)   Social Connection and Isolation Panel    Frequency of Communication with Friends and Family: Once a week    Frequency of Social Gatherings with Friends and Family: Once a week    Attends Religious Services: Never    Database administrator or Organizations: No    Attends Engineer, structural: Not on file    Marital Status: Divorced    Allergies: No Known Allergies  Current Medications: Current Outpatient Medications  Medication Sig Dispense Refill   gabapentin  (NEURONTIN ) 100 MG capsule Take 1 capsule (100 mg total) by mouth daily as needed. 30 capsule 2   mirtazapine  (REMERON ) 15 MG tablet Take 1 tablet (15  mg total) by mouth at bedtime. 30 tablet 1   Apoaequorin (PREVAGEN EXTRA STRENGTH PO) Take by mouth.     [START ON 12/29/2023] ARIPiprazole  (ABILIFY ) 5 MG tablet Take 1 tablet (5 mg total) by mouth daily. 30 tablet 1   busPIRone  (BUSPAR ) 30 MG tablet Take 1 tablet (30 mg total) by mouth 2 (two) times daily. 60 tablet 2   clotrimazole  (CLOTRIMAZOLE  ANTI-FUNGAL) 1 % cream Apply 1 Application topically 2 (two) times daily. 30 g 0   [START ON 12/29/2023] escitalopram  (LEXAPRO ) 5 MG tablet Take 3 tablets (15 mg total) by mouth daily. 90 tablet 2   Multiple Vitamins-Minerals (MULTIVITAMIN PO) Take 1 tablet by mouth daily.     Omeprazole  Magnesium (PRILOSEC PO)      propranolol  (INDERAL ) 20 MG tablet Take 1 tablet (20 mg total) by mouth 2 (two) times daily as needed. 60 tablet 2    valACYclovir  (VALTREX ) 500 MG tablet Take 500 mg by mouth 2 (two) times daily.     No current facility-administered medications for this visit.    ROS: Review of Systems  All other systems reviewed and are negative.   Objective:  Objective: Psychiatric Specialty Exam: General Appearance: Casual, fairly groomed  Eye Contact:  Good    Speech:  Clear, coherent, normal rate, spontaneous  Volume:  Normal   Mood:  see above  Affect:  anxious, congruent  Thought Content: Logical,   Suicidal Thoughts: see subjective  Thought Process:  Coherent, goal-directed,   Orientation:  A&Ox4   Memory:  Immediate good  Judgment:  Good  Insight:  Fair  Concentration:  Attention and concentration good   Recall:  Good  Fund of Knowledge: Good  Language: Good, fluent  Psychomotor Activity: Normal  Akathisia:  NA   AIMS (if indicated): NA   Assets:   Communication Skills Desire for Improvement Resilience  ADL's:  Intact  Cognition: WNL  Sleep: see above  Appetite: see above    Physical Exam Constitutional:      General: She is not in acute distress.    Appearance: She is not ill-appearing.  HENT:     Head: Normocephalic and atraumatic.  Eyes:     Extraocular Movements: Extraocular movements intact.     Conjunctiva/sclera: Conjunctivae normal.  Pulmonary:     Effort: Pulmonary effort is normal. No respiratory distress.  Neurological:     General: No focal deficit present.     Mental Status: She is alert and oriented to person, place, and time.      Metabolic Disorder Labs: Lab Results  Component Value Date   HGBA1C 5.7 08/19/2023   No results found for: PROLACTIN Lab Results  Component Value Date   CHOL 184 08/19/2023   TRIG 118.0 08/19/2023   HDL 55.80 08/19/2023   CHOLHDL 3 08/19/2023   VLDL 23.6 08/19/2023   LDLCALC 105 (H) 08/19/2023   LDLCALC 125 (H) 03/25/2023   Lab Results  Component Value Date   TSH 2.30 08/19/2023   TSH 1.760 02/05/2022     Therapeutic Level Labs: No results found for: LITHIUM No results found for: VALPROATE No results found for: CBMZ  Screenings:  GAD-7    Flowsheet Row [facility]  Total GAD-7 Score 6   PHQ2-9    Flowsheet Row Office Visit from 11/24/2023 in [facility] Clinical Support from 11/21/2023 in [facility] Office Visit from 10/06/2023 in [facility] Office Visit from 08/19/2023 in [facility]  PHQ-2 Total Score 0 3 0 0  PHQ-9 Total Score 1 3 -- 0    Collaboration of Care:   Patient/Guardian was advised Release of Information must be obtained prior to any record release in order to collaborate their care with an outside provider. Patient/Guardian was advised if they have not already done so to contact the registration department to sign all necessary forms in order for us  to release information regarding their care.   Consent: Patient/Guardian gives verbal consent for treatment and assignment of benefits for services provided during this visit. Patient/Guardian expressed understanding and agreed to proceed.    Marlo Masson, MD 11/24/2023, 11:51 AM

## 2023-11-21 NOTE — Progress Notes (Signed)
 Subjective:   Sharon Cole is a 68 y.o. who presents for a Medicare Wellness preventive visit.  As a reminder, Annual Wellness Visits don't include a physical exam, and some assessments may be limited, especially if this visit is performed virtually. We may recommend an in-person follow-up visit with your provider if needed.  Visit Complete: In person  Persons Participating in Visit: Patient.  AWV Questionnaire: Yes: Patient Medicare AWV questionnaire was completed by the patient on 11/21/2023; I have confirmed that all information answered by patient is correct and no changes since this date.  Cardiac Risk Factors include: advanced age (>19men, >61 women);dyslipidemia;obesity (BMI >30kg/m2)     Objective:    Today's Vitals   11/21/23 0851  BP: 110/68  Pulse: 64  SpO2: 97%  Weight: 221 lb 6.4 oz (100.4 kg)  Height: 5' 4.5 (1.638 m)   Body mass index is 37.42 kg/m.     11/21/2023    8:56 AM 08/06/2016   12:08 PM 12/15/2014    7:53 AM 09/05/2014    8:11 AM 08/08/2014    8:14 AM 08/02/2014   10:23 AM  Advanced Directives  Does Patient Have a Medical Advance Directive? No No  No  No  Yes  No   Type of Advance Directive     Healthcare Power of Attorney    Would patient like information on creating a medical advance directive?   No - patient declined information  No - patient declined information   No - patient declined information      Data saved with a previous flowsheet row definition    Current Medications (verified) Outpatient Encounter Medications as of 11/21/2023  Medication Sig   Apoaequorin (PREVAGEN EXTRA STRENGTH PO) Take by mouth.   ARIPiprazole  (ABILIFY ) 5 MG tablet Take 1 tablet (5 mg total) by mouth daily.   busPIRone  (BUSPAR ) 30 MG tablet Take 1 tablet (30 mg total) by mouth 2 (two) times daily.   clotrimazole  (CLOTRIMAZOLE  ANTI-FUNGAL) 1 % cream Apply 1 Application topically 2 (two) times daily.   escitalopram  (LEXAPRO ) 5 MG tablet Take 3 tablets (15 mg  total) by mouth daily.   mirtazapine  (REMERON ) 30 MG tablet    Multiple Vitamins-Minerals (MULTIVITAMIN PO) Take 1 tablet by mouth daily.   Omeprazole  Magnesium (PRILOSEC PO)    propranolol  (INDERAL ) 20 MG tablet Take 1 tablet (20 mg total) by mouth 2 (two) times daily as needed.   valACYclovir  (VALTREX ) 500 MG tablet Take 500 mg by mouth 2 (two) times daily.   No facility-administered encounter medications on file as of 11/21/2023.    Allergies (verified) Patient has no known allergies.   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   Hyperlipidemia 08/18/2023   Menopause    Prediabetes 08/18/2023   Syncope    Past Surgical History:  Procedure Laterality Date   BACK SURGERY     cyst removal    BREAST SURGERY     breast reduction   BUNIONECTOMY Left 10/28/2023   CHOLECYSTECTOMY     KNEE ARTHROSCOPY     SMALL INTESTINE SURGERY     SPINE SURGERY     Family History  Problem Relation Age of Onset  Heart disease Mother    Heart disease Brother    Heart disease Maternal Grandmother    Heart disease Maternal Grandfather    Cancer Son        unknown   ADD / ADHD Son    Depression Son    ADD / ADHD Son    Cancer Son    Social History   Socioeconomic History   Marital status: Significant Other    Spouse name: Not on file   Number of children: 2   Years of education: Masters   Highest education level: Master's degree (e.g., MA, MS, MEng, MEd, MSW, MBA)  Occupational History   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  Tobacco Use   Smoking status: Never   Smokeless tobacco: Never  Vaping Use   Vaping status: Never Used  Substance and Sexual Activity   Alcohol use: Yes    Alcohol/week: 1.0  standard drink of alcohol    Types: 1 Standard drinks or equivalent per week   Drug use: No   Sexual activity: Not Currently    Partners: Male  Other Topics Concern   Not on file  Social History Narrative   Lives alone/2025   Caffeine use:    Drinks 16oz caffeine drinks a day    Social Drivers of Corporate investment banker Strain: Low Risk  (11/21/2023)   Overall Financial Resource Strain (CARDIA)    Difficulty of Paying Living Expenses: Not hard at all  Food Insecurity: No Food Insecurity (11/21/2023)   Hunger Vital Sign    Worried About Running Out of Food in the Last Year: Never true    Ran Out of Food in the Last Year: Never true  Transportation Needs: No Transportation Needs (11/21/2023)   PRAPARE - Administrator, Civil Service (Medical): No    Lack of Transportation (Non-Medical): No  Physical Activity: Inactive (11/21/2023)   Exercise Vital Sign    Days of Exercise per Week: 0 days    Minutes of Exercise per Session: Not on file  Stress: No Stress Concern Present (11/21/2023)   [employer] of Occupational Health - Occupational Stress Questionnaire    Feeling of Stress: Only a little  Social Connections: Socially Isolated (11/21/2023)   Social Connection and Isolation Panel    Frequency of Communication with Friends and Family: Once a week    Frequency of Social Gatherings with Friends and Family: Once a week    Attends Religious Services: Never    Database administrator or Organizations: No    Attends Engineer, structural: Not on file    Marital Status: Divorced    Tobacco Counseling Counseling given: Not Answered    Clinical Intake:  Pre-visit preparation completed: Yes  Pain : No/denies pain     BMI - recorded: 37.42 Nutritional Status: BMI > 30  Obese Nutritional Risks: None Diabetes: No  Lab Results  Component Value Date   HGBA1C 5.7 08/19/2023   HGBA1C 5.9 (H) 03/25/2023   HGBA1C 5.7 (H) 02/05/2022     How often do  you need to have someone help you when you read instructions, pamphlets, or other written materials from your doctor or pharmacy?: 1 - Never  Interpreter Needed?: No  Information entered by :: Shadiyah Wernli, RMA   Activities of Daily Living     11/21/2023    7:54 AM 11/10/2023    3:00 PM  In your present state of health, do you have any difficulty performing the following  activities:  Hearing? 0 0  Vision? 0 0  Difficulty concentrating or making decisions? 0 0  Walking or climbing stairs? 0 0  Dressing or bathing? 0 0  Doing errands, shopping? 0 0  Preparing Food and eating ? N N  Using the Toilet? N N  In the past six months, have you accidently leaked urine? Y Y  Do you have problems with loss of bowel control? N N  Managing your Medications? N N  Managing your Finances? N N  Housekeeping or managing your Housekeeping? N N    Patient Care Team: Lendia Boby CROME, NP-C as PCP - General (Family Medicine) Jesus Oliphant, MD as Consulting Physician (Otolaryngology)  I have updated your Care Teams any recent Medical Services you may have received from other providers in the past year.     Assessment:   This is a routine wellness examination for Sharon Cole.  Hearing/Vision screen Hearing Screening - Comments:: Denies hearing difficulties   Vision Screening - Comments:: Wears eyeglasses/[facility]   Goals Addressed               This Visit's Progress     Patient Stated (pt-stated)        More exercise and weight loss/2025       Depression Screen     11/21/2023    8:57 AM 10/06/2023   11:27 AM 08/19/2023    8:59 AM 08/19/2023    8:51 AM  PHQ 2/9 Scores  PHQ - 2 Score 3 0 0 0  PHQ- 9 Score 3  0     Fall Risk     11/21/2023    7:54 AM 11/10/2023    3:00 PM 10/06/2023   11:27 AM 08/19/2023    8:51 AM 07/03/2016    7:40 AM  Fall Risk   Falls in the past year? 0 0 0 0 No   Number falls in past yr: 0 0 0 0   Injury with Fall? 0 0 0 0   Risk for fall due to :   No Fall  Risks No Fall Risks   Follow up Falls evaluation completed;Falls prevention discussed  Falls evaluation completed Falls evaluation completed      Data saved with a previous flowsheet row definition    MEDICARE RISK AT HOME:  Medicare Risk at Home Any stairs in or around the home?: (Patient-Rptd) Yes If so, are there any without handrails?: (Patient-Rptd) No Home free of loose throw rugs in walkways, pet beds, electrical cords, etc?: (Patient-Rptd) Yes Adequate lighting in your home to reduce risk of falls?: (Patient-Rptd) Yes Life alert?: (Patient-Rptd) No Use of a cane, walker or w/c?: (Patient-Rptd) No Grab bars in the bathroom?: (Patient-Rptd) No Shower chair or bench in shower?: (Patient-Rptd) No Elevated toilet seat or a handicapped toilet?: (Patient-Rptd) No  TIMED UP AND GO:  Was the test performed?  Yes  Length of time to ambulate 10 feet: 20 sec Gait slow and steady with assistive device  Cognitive Function: Declined/Normal: No cognitive concerns noted by patient or family. Patient alert, oriented, able to answer questions appropriately and recall recent events. No signs of memory loss or confusion.        Immunizations Immunization History  Administered Date(s) Administered   Influenza-Unspecified 02/04/2015, 01/08/2016, 05/04/2021   PFIZER(Purple Top)SARS-COV-2 Vaccination 07/22/2019, 08/16/2019   Pfizer Covid-19 Vaccine Bivalent Booster 31yrs & up 05/04/2021   Tdap 01/02/2015    Screening Tests Health Maintenance  Topic Date Due   Hepatitis  C Screening  Never done   Zoster Vaccines- Shingrix (1 of 2) Never done   Pneumococcal Vaccine: 50+ Years (1 of 1 - PCV) Never done   DEXA SCAN  Never done   COVID-19 Vaccine (4 - 2024-25 season) 01/05/2023   INFLUENZA VACCINE  12/05/2023   Medicare Annual Wellness (AWV)  11/20/2024   DTaP/Tdap/Td (2 - Td or Tdap) 01/01/2025   MAMMOGRAM  10/16/2025   Colonoscopy  09/03/2026   Hepatitis B Vaccines  Aged Out   HPV  VACCINES  Aged Out   Meningococcal B Vaccine  Aged Out    Health Maintenance  Health Maintenance Due  Topic Date Due   Hepatitis C Screening  Never done   Zoster Vaccines- Shingrix (1 of 2) Never done   Pneumococcal Vaccine: 50+ Years (1 of 1 - PCV) Never done   DEXA SCAN  Never done   COVID-19 Vaccine (4 - 2024-25 season) 01/05/2023   Health Maintenance Items Addressed: Labs Ordered: Hep C, See Nurse Notes at the end of this note  Additional Screening:  Vision Screening: Recommended annual ophthalmology exams for early detection of glaucoma and other disorders of the eye. Would you like a referral to an eye doctor? Yes    Dental Screening: Recommended annual dental exams for proper oral hygiene  Community Resource Referral / Chronic Care Management: CRR required this visit?  No   CCM required this visit?  No   Plan:    I have personally reviewed and noted the following in the patient's chart:   Medical and social history Use of alcohol, tobacco or illicit drugs  Current medications and supplements including opioid prescriptions. Patient is not currently taking opioid prescriptions. Functional ability and status Nutritional status Physical activity Advanced directives List of other physicians Hospitalizations, surgeries, and ER visits in previous 12 months Vitals Screenings to include cognitive, depression, and falls Referrals and appointments  In addition, I have reviewed and discussed with patient certain preventive protocols, quality metrics, and best practice recommendations. A written personalized care plan for preventive services as well as general preventive health recommendations were provided to patient.   Osmond Steckman L Disha Cottam, CMA   11/21/2023   After Visit Summary: (MyChart) Due to this being a telephonic visit, the after visit summary with patients personalized plan was offered to patient via MyChart   Notes: Patient is due for a pneumonia and a Shingrix  vaccine.  Patient stated that she will get both at her pharmacy.  She is also due for a Hep C screening and can get that done during her next office visit. Patient stated that she will call the office to schedule her next office visit with [facility].

## 2023-11-24 ENCOUNTER — Ambulatory Visit (HOSPITAL_BASED_OUTPATIENT_CLINIC_OR_DEPARTMENT_OTHER): Admitting: Student in an Organized Health Care Education/Training Program

## 2023-11-24 ENCOUNTER — Encounter (HOSPITAL_COMMUNITY): Payer: Self-pay | Admitting: Student in an Organized Health Care Education/Training Program

## 2023-11-24 VITALS — BP 118/65 | HR 64 | Ht 65.0 in | Wt 221.0 lb

## 2023-11-24 DIAGNOSIS — F411 Generalized anxiety disorder: Secondary | ICD-10-CM

## 2023-11-24 DIAGNOSIS — F3342 Major depressive disorder, recurrent, in full remission: Secondary | ICD-10-CM

## 2023-11-24 MED ORDER — GABAPENTIN 100 MG PO CAPS: 100.0000 mg | ORAL_CAPSULE | Freq: Every day | ORAL | 2 refills | Status: DC | PRN

## 2023-11-24 MED ORDER — ARIPIPRAZOLE 5 MG PO TABS: 5.0000 mg | ORAL_TABLET | Freq: Every day | ORAL | 1 refills | Status: DC

## 2023-11-24 MED ORDER — MIRTAZAPINE 15 MG PO TABS: 15.0000 mg | ORAL_TABLET | Freq: Every day | ORAL | 1 refills | Status: DC

## 2023-11-24 MED ORDER — ESCITALOPRAM OXALATE 5 MG PO TABS: 15.0000 mg | ORAL_TABLET | Freq: Every day | ORAL | 2 refills | Status: DC

## 2023-11-25 ENCOUNTER — Ambulatory Visit: Admitting: Psychology

## 2023-11-26 ENCOUNTER — Ambulatory Visit

## 2023-11-26 DIAGNOSIS — E2839 Other primary ovarian failure: Secondary | ICD-10-CM

## 2023-11-26 DIAGNOSIS — Z13820 Encounter for screening for osteoporosis: Secondary | ICD-10-CM | POA: Diagnosis not present

## 2023-12-02 ENCOUNTER — Ambulatory Visit (INDEPENDENT_AMBULATORY_CARE_PROVIDER_SITE_OTHER): Admitting: Psychology

## 2023-12-02 DIAGNOSIS — F411 Generalized anxiety disorder: Secondary | ICD-10-CM

## 2023-12-02 DIAGNOSIS — F331 Major depressive disorder, recurrent, moderate: Secondary | ICD-10-CM | POA: Diagnosis not present

## 2023-12-02 NOTE — Progress Notes (Signed)
 Sharon Cole is a 68 y.o. female patient   12/02/2023  Treatment Plan: Diagnosis 296.32 (Major depressive affective disorder, recurrent episode, moderate) [n/a]  300.02 (Generalized anxiety disorder) [n/a]  Symptoms Depressed or irritable mood. (Status: maintained) -- No Description Entered  Feelings of hopelessness, worthlessness, or inappropriate guilt. (Status: maintained) -- No Description Entered  Lack of energy. (Status: maintained) -- No Description Entered  Low self-esteem. (Status: maintained) -- No Description Entered  Medication Status compliance  Safety none  If Suicidal or Homicidal State Action Taken: unspecified  Current Risk: low Medications Abilify  (Dosage: .25mg )  Buspar  (Dosage: 30mg )  Citalopram (Dosage: 20mg )  Topomax (Dosage: unknown)  Objectives Related Problem: Recognize, accept, and cope with feelings of depression. Description: Identify and replace thoughts and beliefs that support depression. Target Date: 2024-04-13 Frequency: Daily Modality: individual Progress: 80%  Related Problem: Recognize, accept, and cope with feelings of depression. Description: Learn and implement behavioral strategies to overcome depression. Target Date: 2024-04-13 Frequency: Daily Modality: individual Progress: 75%  Related Problem: Recognize, accept, and cope with feelings of depression. Description: Verbalize an understanding and resolution of current interpersonal problems. Target Date: 2024-04-13 Frequency: Daily Modality: individual Progress: 90%  Related Problem:  Recognize, accept, and cope with feelings of depression. Description: Verbalize insight into how past relationships may be influencing current experiences with depression. Target Date: 2024-04-13 Frequency: Daily Modality: individual Progress: 90%  Client Response full compliance  Service Location Location, [address] [place], [place] [address]  Service Code cpt [phone]  Normalize/Reframe  Facilitate problem solving  Identify/label emotions  Validate/empathize  Emotion regulation skills  Self care activities  Lifestyle change (exercise, nutrition)  Self-monitoring  Identified an insight  Rationally challenge thoughts or beliefs/cognitive restructuring  Session notes:   Goals/Plan: Wants to work on being genuine and being satisfied with herself and develop stronger self-esteem. Also, would like to improve her primary relationship and have her interpersonal and romantic needs met. This goal is now met, as she had ended the relationship. Wants to continue self-care and maintain weight loss. Needs to develop strategy to manage relationship with her son [name], who struggles with mental health issues. Goal date 12-25. [name] is now wanting to create a new, fulfilling life and pursue her interest in art. Will also attempt to create a small, but satisfying social network with like-minded people. Goal  date is 12-25.   Meds:Lexapro  15mg , Buspar , Abilify  5mg  , Remeron  Patient agrees to video Caregility session and is aware of the limitations of this platform. She is at home and I am at my home office.   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 She has 1 more week until it gets postponed to another quarter. She is already behind on the reach class, and doesn't want to postpone graduation. [name] did not reach out to his father, sho just paid his last month's rent. [name] is going to reach out to [place] even though she says it will be hard. She claims that she is not bored with her  routine. She still has boot on foot, but it comes off in 1 week. Cannot exercise or walk far. Says her diet is fine and she is not binging. She has lost 14 pounds and will go to weight loss doctor Aug. 11th. We talked about ways to support both her physical and mental health.                                                                                                                                                    Sharon [name] [name], PhD  Time: 4:15p-5:00p 45 minutes.

## 2023-12-03 ENCOUNTER — Ambulatory Visit (INDEPENDENT_AMBULATORY_CARE_PROVIDER_SITE_OTHER): Admitting: Internal Medicine

## 2023-12-08 ENCOUNTER — Encounter (INDEPENDENT_AMBULATORY_CARE_PROVIDER_SITE_OTHER): Payer: Self-pay

## 2023-12-09 ENCOUNTER — Ambulatory Visit (INDEPENDENT_AMBULATORY_CARE_PROVIDER_SITE_OTHER): Admitting: Psychology

## 2023-12-09 DIAGNOSIS — F411 Generalized anxiety disorder: Secondary | ICD-10-CM | POA: Diagnosis not present

## 2023-12-09 DIAGNOSIS — F331 Major depressive disorder, recurrent, moderate: Secondary | ICD-10-CM

## 2023-12-09 NOTE — Progress Notes (Signed)
 Sharon Cole is a 68 y.o. female patient   12/09/2023  Treatment Plan: Diagnosis 296.32 (Major depressive affective disorder, recurrent episode, moderate) [n/a]  300.02 (Generalized anxiety disorder) [n/a]  Symptoms Depressed or irritable mood. (Status: maintained) -- No Description Entered  Feelings of hopelessness, worthlessness, or inappropriate guilt. (Status: maintained) -- No Description Entered  Lack of energy. (Status: maintained) -- No Description Entered  Low self-esteem. (Status: maintained) -- No Description Entered  Medication Status compliance  Safety none  If Suicidal or Homicidal State Action Taken: unspecified  Current Risk: low Medications Abilify  (Dosage: .25mg )  Buspar  (Dosage: 30mg )  Citalopram (Dosage: 20mg )  Topomax (Dosage: unknown)  Objectives Related Problem: Recognize, accept, and cope with feelings of depression. Description: Identify and replace thoughts and beliefs that support depression. Target Date: 2024-04-13 Frequency: Daily Modality: individual Progress: 80%  Related Problem: Recognize, accept, and cope with feelings of depression. Description: Learn and implement behavioral strategies to overcome depression. Target Date: 2024-04-13 Frequency: Daily Modality: individual Progress: 75%  Related Problem: Recognize, accept, and cope with feelings of depression. Description: Verbalize an understanding and resolution of current interpersonal problems. Target Date: 2024-04-13 Frequency: Daily Modality: individual Progress: 90%   Related Problem: Recognize, accept, and cope with feelings of depression. Description: Verbalize insight into how past relationships may be influencing current experiences with depression. Target Date: 2024-04-13 Frequency: Daily Modality: individual Progress: 90%  Client Response full compliance  Service Location Location, [address] [place], [place] [address]  Service Code cpt [phone]  Normalize/Reframe  Facilitate problem solving  Identify/label emotions  Validate/empathize  Emotion regulation skills  Self care activities  Lifestyle change (exercise, nutrition)  Self-monitoring  Identified an insight  Rationally challenge thoughts or beliefs/cognitive restructuring  Session notes:   Goals/Plan: Wants to work on being genuine and being satisfied with herself and develop stronger self-esteem. Also, would like to improve her primary relationship and have her interpersonal and romantic needs met. This goal is now met, as she had ended the relationship. Wants to continue self-care and maintain weight loss. Needs to develop strategy to manage relationship with her son [name], who struggles with mental health issues. Goal date 12-25. 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  Will also attempt to create a small, but satisfying social network with like-minded people. Goal date is 12-25.   Meds:Lexapro  15mg , Buspar , Abilify  5mg  , Remeron  Patient agrees to video Caregility session and is aware of the limitations of this platform. She is at home and I am at my home office.   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 Talked about strategy to find a placement, which involves starting the process now. She tried to reach out to [place] and has not heard back. Ron wrote Sharon Cole to remind him that he was not paying his rent and did not hear from him either. She is thinking that Sharon Cole feels he has more  time because the eviction  time is lengthy. She went to grandson's birthday lunch and has been off her diet since. She is also saying that she feels the need to change jobs. She feels her boss is awful and is not happy with co-workers. In addition, she says her job performance is in question and wants to avoid reprimand. Talked about how to create priorities.                                                                                                                                                      Sharon [name] [name], PhD  Time: 11:30-12:20p 50 minutes.

## 2023-12-11 ENCOUNTER — Encounter: Admitting: Podiatry

## 2023-12-15 ENCOUNTER — Ambulatory Visit (INDEPENDENT_AMBULATORY_CARE_PROVIDER_SITE_OTHER): Admitting: Internal Medicine

## 2023-12-15 ENCOUNTER — Encounter (INDEPENDENT_AMBULATORY_CARE_PROVIDER_SITE_OTHER): Payer: Self-pay | Admitting: Internal Medicine

## 2023-12-15 VITALS — BP 99/71 | HR 60 | Temp 98.1°F | Ht 64.5 in | Wt 214.0 lb

## 2023-12-15 DIAGNOSIS — E7800 Pure hypercholesterolemia, unspecified: Secondary | ICD-10-CM | POA: Diagnosis not present

## 2023-12-15 DIAGNOSIS — R7309 Other abnormal glucose: Secondary | ICD-10-CM | POA: Insufficient documentation

## 2023-12-15 DIAGNOSIS — R5383 Other fatigue: Secondary | ICD-10-CM

## 2023-12-15 DIAGNOSIS — R0602 Shortness of breath: Secondary | ICD-10-CM | POA: Diagnosis not present

## 2023-12-15 DIAGNOSIS — R7303 Prediabetes: Secondary | ICD-10-CM

## 2023-12-15 DIAGNOSIS — E66812 Obesity, class 2: Secondary | ICD-10-CM

## 2023-12-15 DIAGNOSIS — Z1331 Encounter for screening for depression: Secondary | ICD-10-CM

## 2023-12-15 DIAGNOSIS — R400 Somnolence: Secondary | ICD-10-CM | POA: Insufficient documentation

## 2023-12-15 DIAGNOSIS — T50905A Adverse effect of unspecified drugs, medicaments and biological substances, initial encounter: Secondary | ICD-10-CM | POA: Insufficient documentation

## 2023-12-15 DIAGNOSIS — Z6836 Body mass index (BMI) 36.0-36.9, adult: Secondary | ICD-10-CM

## 2023-12-15 NOTE — Assessment & Plan Note (Signed)
 We are checking a fasting blood glucose, and insulin  level today.

## 2023-12-15 NOTE — Assessment & Plan Note (Signed)
 Patient on propranolol , Abilify  and mirtazapine  which may cause weight gain.  She will work with prescribing physicians to find weight neutral alternatives.

## 2023-12-15 NOTE — Assessment & Plan Note (Signed)
 She has an elevated Epworth but no signs of snoring or witnessed apneas.  Her daytime somnolence may be due to CNS depressing medications and morning-after effects.  She is working on coming off mirtazapine  with her psychiatrist.  We will assess after that and consider referral for sleep study.

## 2023-12-15 NOTE — Progress Notes (Signed)
 [address] [place],  [place], [place] [address]  Office: [phone]  /  Fax: [phone]   Subjective   Initial Visit  Sharon Cole (MR# [id number]) is a 68 y.o. female who presents for evaluation and treatment of obesity and related comorbidities. Current BMI is Body mass index is 36.17 kg/m. Sharon Cole has been struggling with her weight for many years and has been unsuccessful in either losing weight, maintaining weight loss, or reaching her healthy weight goal.  Sharon Cole is currently in the action stage of change and ready to dedicate time achieving and maintaining a healthier weight. Sharon Cole is interested in becoming our patient and working on intensive lifestyle modifications including (but not limited to) diet and exercise for weight loss.  Weight history:  When asked how their weight has affected their life and health, she states: Contributed to medical problems, Contributed to orthopedic problems or mobility issues, Having fatigue, Having poor endurance, Problems with eating patterns, and Has affected mood   When asked what else they would like to accomplish? She states: Adopt a healthier eating pattern and lifestyle, Improve energy levels and physical activity, Improve existing medical conditions, Improve quality of life, and Lose 60-80 lbs  She starting to note weight gain during : teens.  Life events associated with weight gain include : parents divorcing.   Other contributing factors: family history of obesity, disruption of circadian rhythm / sleep disordered breathing, consumption of processed foods, use of obesogenic medications: Beta-blockers and Psychotropic medications, moderate to high levels of stress, and menopause.  Their highest weight has been:  231 lbs.  Desired weight: 140  Previous weight-loss programs : None and Ketogenic.  Their maximum weight loss was:  40 lbs.  Their greatest challenge with dieting: meal preparation and cooking.  Current or previous  pharmacotherapy: Other: stimulant drops.  Response to medication: Lost weight initially but was unable to sustain weight loss  Discussed the use of AI scribe software for clinical note transcription with the patient, who gave verbal consent to proceed.  History of Present Illness   Sharon Cole is a 68 year old female who presents for an intake appointment for weight management.  She has experienced weight gain since her preteen years, attributing it to family disruptions such as her mother's divorce and remarriage. She has tried various diets, including Nutrisystem and a ketogenic diet, losing about 45 pounds on the latter. However, she regained weight after her mother's death and a broken engagement. Her target weight is 140 pounds.  She has a history of high cholesterol, prediabetes, and sleep apnea. Her last A1c was 5.7. She did not experience diabetes or elevated blood pressure during her two pregnancies. She reached menopause, which she notes has contributed to weight gain, particularly in the torso area, due to hormonal changes.  She is currently on propranolol , Abilify , and mirtazapine , which may contribute to weight gain. She is in the process of weaning off mirtazapine .  Her dietary habits include eating out five days a week due to time constraints from working part-time and attending school. She describes herself as 'picky' with proteins, having been vegetarian and pescatarian in the past, and currently avoids chicken. She snacks on almonds and fruit, skips breakfast, and primarily drinks coffee and water. She uses monk fruit with erythritol as a sweetener.  She feels refreshed upon waking, though she snores and can fall asleep during the day if tired. No episodes of waking up gasping for air. Her physical activity has decreased after  foot surgery seven weeks ago, limiting her ability to walk. She acknowledges having an 'all or none' mindset, affecting her consistency with  exercise and dieting.      Nutritional History:  Current nutrition plan: None.  How many times do you eat outside the home: 5-7 per week  How often do they skip meals: skips breakfast  What beverages do they drink: water and caffeinated beverages .   Use of artificial sweetners : Yes  Food intolerances or dislikes: meats.  Food triggers: Stress and To help comfort self.  Food cravings: Starches / Carbohydrates  Do they struggle with excessive hunger or portion control : No    Physical Activity:  Current level of physical activity: None and Low levels of physical activity at present  Barriers to Exercise: stopped walking as she gained weight   Past medical history includes:   Past Medical History:  Diagnosis Date   Anxiety    Back pain    Bradycardia    Common migraine with intractable migraine 07/03/2016   Depression    Dizziness    Edema of both lower extremities    Flatulence, eructation and gas pain 07/08/2019   Gallbladder problem    Headache    History of colonic polyps 07/08/2019   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    Anthropometrics:  Vitals Temp: 98.1 F (36.7 C) BP: 99/71 Pulse Rate: 60 SpO2: 92 %   Anthropometric Measurements Height: 5' 4.5 (1.638 m) Weight: 214 lb (97.1 kg) BMI (Calculated): 36.18 Starting Weight: 214 lb Peak Weight: 231 lb Waist Measurement : 44 inches   Body Composition  Body Fat %: 48.6 % Fat Mass (lbs): 104.4 lbs Muscle Mass (lbs): 104.8 lbs Total Body Water (lbs): 81.4 lbs Visceral Fat Rating : 15   Other Clinical Data RMR: [id number] Fasting: yes Labs: yes Today's Visit #: 1 Starting Date:  12/15/23    Physical Exam:  General: She is overweight, cooperative, alert, well developed, and in no acute distress. PSYCH: Has normal mood, affect and thought process.   HEENT: EOMI, sclerae are anicteric. Lungs: Normal breathing effort, no conversational dyspnea. Extremities: No edema.  Neurologic: No gross sensory or motor deficits. No tremors or fasciculations noted.    Diagnostic Data Reviewed  EKG: Normal sinus rhythm, rate 66 bpm. No conduction abnormalities, abnormal Q waves or chamber enlargement.  Indirect Calorimeter completed today shows a VO2 of 268 and a REE of 1843.  Her calculated basal metabolic rate is 8437 thus her resting energy expenditure faster than calculated.  Depression Screen  Sharon Cole's PHQ-9 score was: 0.     12/15/2023    7:22 AM  Depression screen PHQ 2/9  Decreased Interest 0  Down, Depressed, Hopeless 0  PHQ - 2 Score 0  Altered sleeping 0  Tired, decreased energy 0  Change in appetite 0  Feeling bad or failure about yourself  0  Trouble concentrating 0  Moving slowly or fidgety/restless 0  Suicidal thoughts 0  PHQ-9 Score 0    Screening for Sleep Related Breathing Disorders  Sharon Cole denies  daytime somnolence and denies waking up still tired. Patient denies a history of symptoms of OSA. Sharon Cole generally gets 8 or 9 hours of sleep per night, and states that she has generally restful sleep. Snoring may be present. Apneic episodes are not present. Epworth Sleepiness Score is 10.   BMET    Component Value Date/Time   NA 141 08/19/2023 0932   K 4.1 08/19/2023 0932   CL 106 08/19/2023 0932   CO2 28 08/19/2023 0932   GLUCOSE 104 (H) 08/19/2023 0932   BUN 13 08/19/2023 0932   CREATININE 0.85 08/19/2023 0932   CREATININE 0.60 12/22/2013 1045   CALCIUM 9.4 08/19/2023 0932   GFRNONAA >60 08/06/2016 1207   GFRAA >60 08/06/2016 1207   Lab Results  Component Value Date   HGBA1C 5.7 08/19/2023   HGBA1C 5.7 (H) 02/05/2022   No results found  for: INSULIN  CBC    Component Value Date/Time   WBC 7.3 08/19/2023 0932   RBC 5.28 (H) 08/19/2023 0932   HGB 14.4 08/19/2023 0932   HCT 44.1 08/19/2023 0932   PLT 215.0 08/19/2023 0932   MCV 83.5 08/19/2023 0932   MCH 27.6 08/06/2016 1207   MCHC 32.6 08/19/2023 0932   RDW 15.2 08/19/2023 0932   Iron/TIBC/Ferritin/ %Sat No results found for: IRON, TIBC, FERRITIN, IRONPCTSAT Lipid Panel     Component Value Date/Time   CHOL 184 08/19/2023 0932   CHOL 198 03/25/2023 1014   TRIG 118.0 08/19/2023 0932   HDL 55.80 08/19/2023 0932   HDL 55 03/25/2023 1014   CHOLHDL 3 08/19/2023 0932   VLDL 23.6 08/19/2023 0932   LDLCALC 105 (H) 08/19/2023 0932   LDLCALC 125 (H) 03/25/2023 1014   Hepatic Function Panel     Component Value Date/Time   PROT 6.8 08/19/2023 0932   ALBUMIN 4.4 08/19/2023 0932   AST 21 08/19/2023 0932   ALT 24 08/19/2023 0932   ALKPHOS 78 08/19/2023 0932   BILITOT 0.4 08/19/2023 0932   BILIDIR 0.0 02/13/2015 1542      Component Value Date/Time   TSH 2.30 08/19/2023 0932     Assessment and Plan   TREATMENT PLAN FOR OBESITY:  Recommended Dietary Goals  Mykah is currently in the action stage of change. As such, her goal is to implement medically supervised obesity management plan.  She has agreed to implement: the Category 1 plan - 1000 kcal per day, her IC is overestimating basal metabolic rate so anticipate some adjustment to her calorie target after implementation of 1000-calorie strategy.  Behavioral Intervention  We discussed the following Behavioral Modification Strategies today: increasing lean protein intake to established goals, decreasing simple carbohydrates , increasing vegetables, increasing lower glycemic fruits, increasing fiber rich foods, avoiding skipping meals, increasing water intake, work on meal planning and preparation, reading food labels , keeping healthy foods at home, identifying sources and decreasing liquid calories,  decreasing eating out or consumption of processed foods, and making healthy choices when eating convenient foods, planning for success, and better snacking choices  Additional resources provided today: Handout on healthy eating and balanced plate, Handout on complex carbohydrates and lean sources of protein, Category 1 packet, and principles of weight management.  She was also provided with a personalized review of all these handouts.  Recommended Physical Activity Goals  Sharon Cole has been advised to work up to 150 minutes of moderate intensity aerobic activity a week and strengthening exercises 2-3 times per week for cardiovascular health, weight loss maintenance and preservation of muscle mass.  She has agreed to :  Think about enjoyable ways to increase daily physical activity and overcoming barriers to exercise and Increase physical activity in their day and reduce sedentary time (increase NEAT).  Medical Interventions and Pharmacotherapy We will work on building a Therapist, art and behavioral strategies. We will discuss the role of pharmacotherapy as an adjunct at subsequent visits.   ASSOCIATED CONDITIONS ADDRESSED TODAY  Other Fatigue Tru denies daytime somnolence and denies waking up still tired. Patient denies a history of symptoms of OSA. Sharon Cole generally gets 8 or 9 hours of sleep per night, and states that she has generally restful sleep. Snoring may be present. Apneic episodes are not present. Epworth Sleepiness Score is 10. Sharon Cole does feel that her weight is causing her energy to be lower than it should be. Fatigue may be related to obesity, depression or many other causes. Labs will be ordered, and in the meanwhile, Sharon Cole will focus on self care including making healthy food choices, increasing physical activity and focusing on stress reduction.  Shortness of Breath Sharon Cole notes increasing shortness of breath with physical activity and seems to be worsening  over time with weight gain. She notes getting out of breath sooner with activity than she used to. This has not gotten worse recently. Sharon Cole denies shortness of breath at rest or orthopnea.  Other fatigue -     Vitamin B12  SOB (shortness of breath) on exertion  Depression screen  Pure hypercholesterolemia Assessment & Plan: Reviewed most recent labs data mild elevated LDL cholesterol.  She is currently on Abilify  which may affect glucose and cholesterol levels.  We will assess cardiovascular risk at her post intake appointment.   Prediabetes Assessment & Plan: Improved most recent A1c was 5.7 previous 5.9.  She will be starting a low-carb meal plan.   Class 2 severe obesity with serious comorbidity and body mass index (BMI) of 36.0 to 36.9 in adult, unspecified obesity type Sharon Cole) Assessment & Plan: Contributing factors: Presence of obesogenic drugs, low levels of physical activity, daytime somnolence, genetics, menopause, multiple attempts at weight loss.    Orders: -     VITAMIN D  25 Hydroxy (Vit-D Deficiency, Fractures)  Weight gain due to medication Assessment & Plan: Patient on propranolol , Abilify  and mirtazapine  which may cause weight gain.  She will work with prescribing physicians to find weight neutral alternatives.   Abnormal glucose Assessment & Plan: We are checking a fasting blood glucose, and insulin  level today.  Orders: -     Insulin , random -     Glucose, fasting  Daytime somnolence Assessment & Plan: She has an elevated Epworth but no signs of snoring or witnessed apneas.  Her daytime somnolence may be due to CNS depressing medications and morning-after effects.  She is working on coming off mirtazapine  with her psychiatrist.  We will assess after that and consider referral for sleep study.    Assessment & Plan Other fatigue  SOB (shortness of breath) on exertion  Depression screen  Pure hypercholesterolemia Reviewed most recent labs data mild  elevated LDL cholesterol.  She is currently on Abilify  which may affect glucose and cholesterol levels.  We will assess cardiovascular risk at her post intake appointment. Prediabetes Improved most recent A1c was 5.7 previous 5.9.  She will be starting a low-carb meal plan. Class 2 severe obesity with serious comorbidity and body mass index (BMI) of 36.0 to 36.9 in adult, unspecified obesity type (HCC) Contributing factors: Presence of obesogenic drugs, low  levels of physical activity, daytime somnolence, genetics, menopause, multiple attempts at weight loss.   Weight gain due to medication Patient on propranolol , Abilify  and mirtazapine  which may cause weight gain.  She will work with prescribing physicians to find weight neutral alternatives. Abnormal glucose We are checking a fasting blood glucose, and insulin  level today. Daytime somnolence She has an elevated Epworth but no signs of snoring or witnessed apneas.  Her daytime somnolence may be due to CNS depressing medications and morning-after effects.  She is working on coming off mirtazapine  with her psychiatrist.  We will assess after that and consider referral for sleep study.   Follow-up  She was informed of the importance of frequent follow-up visits to maximize her success with intensive lifestyle modifications for her multiple health conditions. She was informed we would discuss her lab results at her next visit unless there is a critical issue that needs to be addressed sooner. Sharon Cole agreed to keep her next visit at the agreed upon time to discuss these results.  Attestation Statement  This is the patient's intake visit at [facility]. The patient's Health Questionnaire was reviewed at length. Included in the packet: current and past health history, medications, allergies, ROS, gynecologic history (women only), surgical history, family history, social history, weight history, weight loss surgery history (for those  that have had weight loss surgery), nutritional evaluation, mood and food questionnaire, PHQ9, Epworth questionnaire, sleep habits questionnaire, patient life and health improvement goals questionnaire. These will all be scanned into the patient's chart under media.   During the visit, I independently reviewed the patient's, previous labs, bioimpedance scale results, and indirect calorimetry results. I used this information to medically tailor a meal plan for the patient that will help her to lose weight and will improve her obesity-related conditions. I performed a medically necessary appropriate examination and/or evaluation. I discussed the assessment and treatment plan with the patient. The patient was provided an opportunity to ask questions and all were answered. The patient agreed with the plan and demonstrated an understanding of the instructions. Labs were ordered at this visit and will be reviewed at the next visit unless critical results need to be addressed immediately. Clinical information was updated and documented in the EMR.   In addition, they received basic education on identification of processed foods and reduction of these, different sources of lean proteins and complex carbohydrates and how to eat balanced by incorporation of whole foods.  Reviewed by clinician on day of visit: allergies, medications, problem list, medical history, surgical history, family history, social history, and previous encounter notes.  I have spent 61 minutes in the care of the patient today including: 5 minutes before the visit reviewing and preparing the chart. 43 minutes face-to-face assessing and reviewing listed medical problems as outlined in obesity care plan, providing nutritional and behavioral counseling on topics outlined in the obesity care plan, independently interpreting test results and goals of care, as described in assessment and plan, reviewing and discussing biometric information and  progress, and ordering diagnostics - see orders 13 minutes after the visit updating chart and documentation of encounter.       Lucas Parker, MD

## 2023-12-15 NOTE — Assessment & Plan Note (Signed)
 Improved most recent A1c was 5.7 previous 5.9.  She will be starting a low-carb meal plan.

## 2023-12-15 NOTE — Assessment & Plan Note (Signed)
 Reviewed most recent labs data mild elevated LDL cholesterol.  She is currently on Abilify  which may affect glucose and cholesterol levels.  We will assess cardiovascular risk at her post intake appointment.

## 2023-12-15 NOTE — Assessment & Plan Note (Signed)
 Contributing factors: Presence of obesogenic drugs, low levels of physical activity, daytime somnolence, genetics, menopause, multiple attempts at weight loss.

## 2023-12-16 ENCOUNTER — Ambulatory Visit: Admitting: Psychology

## 2023-12-16 LAB — VITAMIN B12: Vitamin B-12: 1043 pg/mL (ref 232–1245)

## 2023-12-16 LAB — INSULIN, RANDOM: INSULIN: 9.1 u[IU]/mL (ref 2.6–24.9)

## 2023-12-16 LAB — VITAMIN D 25 HYDROXY (VIT D DEFICIENCY, FRACTURES): Vit D, 25-Hydroxy: 45.6 ng/mL (ref 30.0–100.0)

## 2023-12-16 LAB — GLUCOSE, FASTING: Glucose, Plasma: 92 mg/dL (ref 70–99)

## 2023-12-17 ENCOUNTER — Ambulatory Visit (INDEPENDENT_AMBULATORY_CARE_PROVIDER_SITE_OTHER): Admitting: Internal Medicine

## 2023-12-22 ENCOUNTER — Ambulatory Visit (INDEPENDENT_AMBULATORY_CARE_PROVIDER_SITE_OTHER): Admitting: Psychology

## 2023-12-22 DIAGNOSIS — F411 Generalized anxiety disorder: Secondary | ICD-10-CM | POA: Diagnosis not present

## 2023-12-22 DIAGNOSIS — F331 Major depressive disorder, recurrent, moderate: Secondary | ICD-10-CM

## 2023-12-22 NOTE — Progress Notes (Signed)
 Sharon Cole is a 68 y.o. 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 (Status: maintained) -- No Description Entered  Feelings of hopelessness, worthlessness, or inappropriate guilt. (Status: maintained) -- No Description Entered  Lack of energy. (Status: maintained) -- No Description Entered  Low self-esteem. (Status: maintained) -- No Description Entered  Medication Status compliance  Safety none  If Suicidal or Homicidal State Action Taken: unspecified  Current Risk: low Medications Abilify  (Dosage: .25mg )  Buspar  (Dosage: 30mg )  Citalopram (Dosage: 20mg )  Topomax (Dosage: unknown)  Objectives Related Problem: Recognize, accept, and cope with feelings of depression. Description: Identify and replace thoughts and beliefs that support depression. Target Date: 2024-04-13 Frequency: Daily Modality: individual Progress: 80%  Related Problem: Recognize, accept, and cope with feelings of depression. Description: Learn and implement behavioral strategies to overcome depression. Target Date: 2024-04-13 Frequency: Daily Modality: individual Progress: 75%  Related Problem: Recognize, accept, and cope with feelings of depression. Description: Verbalize an understanding and resolution of current interpersonal problems. Target Date: 2024-04-13 Frequency:  Daily Modality: individual Progress: 90%  Related Problem: Recognize, accept, and cope with feelings of depression. Description: Verbalize insight into how past relationships may be influencing current experiences with depression. Target Date: 2024-04-13 Frequency: Daily Modality: individual Progress: 90%  Client Response full compliance  Service Location Location, [address] [place], [place] [address]  Service Code cpt [phone]  Normalize/Reframe  Facilitate problem solving  Identify/label emotions  Validate/empathize  Emotion regulation skills  Self care activities  Lifestyle change (exercise, nutrition)  Self-monitoring  Identified an insight  Rationally challenge thoughts or beliefs/cognitive restructuring  Session notes:   Goals/Plan: Wants to work on being genuine and being satisfied with herself and develop stronger self-esteem. Also, would like to improve her primary relationship and have her interpersonal and romantic needs met. This goal is now met, as she had ended the relationship. Wants to continue self-care and maintain weight loss. Needs to develop strategy to manage relationship with her son [name], who struggles with mental health issues. Goal date 12-25. [name]  is now wanting to create a new, fulfilling life and pursue her interest in art. Will also attempt to create a small, but satisfying social network with like-minded people. Goal date is 12-25.   Meds:Lexapro  15mg , Buspar , Abilify  5mg  , Remeron  Patient agrees to video Caregility session and is aware of the limitations of this platform. She is at home and I am at my home office.   [name] she thinks she has found a placement for school. This would start in November. 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 She stll hasn't heard from [place] other than he isn't doing well and is working on a plan. She is not getting overly involved, which  is a good boundary. Recognizes limits to what she can do. Still looking for a job, but will not leave position until she finds a better opportunity. She reports she is feeling pretty good. Went to [facility], and did not hear anything new. Was told that she would need to consume 1050 calories/day to lose 1 pound/week. She is going to watch calories and go on the Keto diet as recommended. Will officially start today. Discussed elements of success and how to maintain momentum.                                                                                                                                                        CONI [name] [name], PhD  Time: 1:10p-2:00p 50 minutes.

## 2023-12-23 ENCOUNTER — Ambulatory Visit: Admitting: Psychology

## 2023-12-24 ENCOUNTER — Ambulatory Visit (HOSPITAL_COMMUNITY): Admitting: Student in an Organized Health Care Education/Training Program

## 2023-12-25 ENCOUNTER — Encounter: Admitting: Podiatry

## 2023-12-29 ENCOUNTER — Ambulatory Visit (INDEPENDENT_AMBULATORY_CARE_PROVIDER_SITE_OTHER): Admitting: Internal Medicine

## 2023-12-30 ENCOUNTER — Ambulatory Visit (INDEPENDENT_AMBULATORY_CARE_PROVIDER_SITE_OTHER): Admitting: Nurse Practitioner

## 2023-12-30 ENCOUNTER — Ambulatory Visit (INDEPENDENT_AMBULATORY_CARE_PROVIDER_SITE_OTHER): Admitting: Psychology

## 2023-12-30 ENCOUNTER — Encounter (INDEPENDENT_AMBULATORY_CARE_PROVIDER_SITE_OTHER): Payer: Self-pay | Admitting: Nurse Practitioner

## 2023-12-30 VITALS — BP 106/71 | HR 68 | Temp 98.5°F | Ht 64.5 in | Wt 214.0 lb

## 2023-12-30 DIAGNOSIS — Z6836 Body mass index (BMI) 36.0-36.9, adult: Secondary | ICD-10-CM

## 2023-12-30 DIAGNOSIS — E66812 Obesity, class 2: Secondary | ICD-10-CM

## 2023-12-30 DIAGNOSIS — F331 Major depressive disorder, recurrent, moderate: Secondary | ICD-10-CM

## 2023-12-30 DIAGNOSIS — F411 Generalized anxiety disorder: Secondary | ICD-10-CM

## 2023-12-30 DIAGNOSIS — R7303 Prediabetes: Secondary | ICD-10-CM

## 2023-12-30 DIAGNOSIS — E7800 Pure hypercholesterolemia, unspecified: Secondary | ICD-10-CM | POA: Diagnosis not present

## 2023-12-30 DIAGNOSIS — E88819 Insulin resistance, unspecified: Secondary | ICD-10-CM

## 2023-12-30 NOTE — Progress Notes (Signed)
 Office: [phone]  /  Fax: [phone]  WEIGHT SUMMARY AND BIOMETRICS  Weight Lost Since Last Visit: 0  Weight Gained Since Last Visit: 0   Vitals Temp: 98.5 F (36.9 C) BP: 106/71 Pulse Rate: 68 SpO2: 96 %   Anthropometric Measurements Height: 5' 4.5 (1.638 m) Weight: 214 lb (97.1 kg) BMI (Calculated): 36.18 Weight at Last Visit: 214 lb Weight Lost Since Last Visit: 0 Weight Gained Since Last Visit: 0 Starting Weight: 214 lb Total Weight Loss (lbs): 0 lb (0 kg)   Body Composition  Body Fat %: 43.4 % Fat Mass (lbs): 93.2 lbs Muscle Mass (lbs): 115.2 lbs Total Body Water (lbs): 82.8 lbs Visceral Fat Rating : 14   Other Clinical Data Fasting: No Labs: No Today's Visit #: 2 Starting Date: 12/15/23     HPI  Chief Complaint: OBESITY  Sharon Cole is here to discuss her progress with her obesity treatment plan. She is on the Keto plan and states she is following her eating plan approximately 50 % of the time. She states she is not exercising   She  Interval History:  Since last office visit she has been following the keto plan after starting the 2nd week after her visit.  Initially went up 3 pounds when she left here but lost it once she started. She is using Premier protein shake for breakfast.  Greek yogurt during lunch. Doing dinner as a meal of cheese omelettes with bacon or cheddar sausage with salads. Coffee with half and half several times a day. She is getting between 1000-1100 calories a day, 90 grams of protein and 20 grams of carbs  She lost 45 pounds about 5 years ago and kept off for several years but multiple stressors-mom passed away and broke up with fiance. She put back on 45 pounds +.  Currently working at [employer] and in school at [employer] to get MSW. She had foot surgery several months ago but cannot tolerate walking due to pain.   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 Ames at [facility] and has  had no further issues.   She is currently on Abilify  5 mg every day, lexapro  5 mg 3 tabs daily, mirtazapine  15 mg at bedtime, and propranolol  20 mg BID as needed for anxiety. States her current medication regime is working well for her.   HOMA-IR score 2.06- consistent with insulin  resistance  The 10-year ASCVD risk score (Arnett DK, et al., 2019) is: 4.6%   Values used to calculate the score:     Age: 68 years     Clincally relevant sex: Female     Is Non-Hispanic African American: No     Diabetic: No     Tobacco smoker: No     Systolic Blood Pressure: 106 mmHg     Is BP treated: No     HDL Cholesterol: 55.8 mg/dL     Total Cholesterol: 184 mg/dL  Mammogram: UTD 3/86/74- negative Colonoscopy:08/16/2016 due 2028 DEXA: 11/26/23 Normal U/S abdomen 2016: Mild fatty liver infiltration PAP: 10/06/23 Negative  PHYSICAL EXAM:  Blood pressure 106/71, pulse 68, temperature 98.5 F (36.9 C), height 5' 4.5 (1.638 m), weight 214 lb (97.1 kg), SpO2 96%. Body mass index is 36.17 kg/m.  General: She is overweight, cooperative, alert, well developed, and in no acute distress. PSYCH: Has normal mood, affect and thought process.   Extremities: No edema.  Neurologic: No gross sensory or motor deficits. No tremors or fasciculations  noted.    DIAGNOSTIC DATA REVIEWED:  BMET    Component Value Date/Time   NA 141 08/19/2023 0932   K 4.1 08/19/2023 0932   CL 106 08/19/2023 0932   CO2 28 08/19/2023 0932   GLUCOSE 104 (H) 08/19/2023 0932   BUN 13 08/19/2023 0932   CREATININE 0.85 08/19/2023 0932   CREATININE 0.60 12/22/2013 1045   CALCIUM 9.4 08/19/2023 0932   GFRNONAA >60 08/06/2016 1207   GFRAA >60 08/06/2016 1207   Lab Results  Component Value Date   HGBA1C 5.7 08/19/2023   HGBA1C 5.7 (H) 02/05/2022   Lab Results  Component Value Date   INSULIN  9.1 12/15/2023   Lab Results  Component Value Date   TSH 2.30 08/19/2023   CBC    Component Value Date/Time   WBC 7.3 08/19/2023  0932   RBC 5.28 (H) 08/19/2023 0932   HGB 14.4 08/19/2023 0932   HCT 44.1 08/19/2023 0932   PLT 215.0 08/19/2023 0932   MCV 83.5 08/19/2023 0932   MCH 27.6 08/06/2016 1207   MCHC 32.6 08/19/2023 0932   RDW 15.2 08/19/2023 0932   Iron Studies No results found for: IRON, TIBC, FERRITIN, IRONPCTSAT Lipid Panel     Component Value Date/Time   CHOL 184 08/19/2023 0932   CHOL 198 03/25/2023 1014   TRIG 118.0 08/19/2023 0932   HDL 55.80 08/19/2023 0932   HDL 55 03/25/2023 1014   CHOLHDL 3 08/19/2023 0932   VLDL 23.6 08/19/2023 0932   LDLCALC 105 (H) 08/19/2023 0932   LDLCALC 125 (H) 03/25/2023 1014   Hepatic Function Panel     Component Value Date/Time   PROT 6.8 08/19/2023 0932   ALBUMIN 4.4 08/19/2023 0932   AST 21 08/19/2023 0932   ALT 24 08/19/2023 0932   ALKPHOS 78 08/19/2023 0932   BILITOT 0.4 08/19/2023 0932   BILIDIR 0.0 02/13/2015 1542      Component Value Date/Time   TSH 2.30 08/19/2023 0932   Nutritional Lab Results  Component Value Date   VD25OH 45.6 12/15/2023   VD25OH 37.24 01/02/2015   VD25OH 57 12/22/2013     ASSESSMENT AND PLAN  TREATMENT PLAN FOR OBESITY:  Recommended Dietary Goals  Sharon Cole is currently in the action stage of change. As such, her goal is to continue weight management plan. She has agreed to the Category 1 Plan in keto style- getting 1000-1100 calories, 90 grams protein and 20 grams of carbs. Recommend adding a salad to her protein shake for lunch for fiber.  Behavioral Intervention  We discussed the following Behavioral Modification Strategies today: increasing lean protein intake to established goals, increasing fiber rich foods, increasing water intake , continue to work on maintaining a reduced calorie state, getting the recommended amount of protein, incorporating whole foods, making healthy choices, staying well hydrated and practicing mindfulness when eating., and increase protein intake, fibrous foods (25 grams per  day for women, 30 grams for men) and water to improve satiety and decrease hunger signals. .  Additional resources provided today: NA  Recommended Physical Activity Goals  Sharon Cole has been advised to work up to 150 minutes of moderate intensity aerobic activity a week and strengthening exercises 2-3 times per week for cardiovascular health, weight loss maintenance and preservation of muscle mass.   She has agreed to Think about enjoyable ways to increase daily physical activity and overcoming barriers to exercise and Increase physical activity in their day and reduce sedentary time (increase NEAT).    ASSOCIATED CONDITIONS ADDRESSED  TODAY  Action/Plan  Pure hypercholesterolemia Discussed losing 10-15 % of body weight can help improve LDL cholesterol Limit saturated fats- choose lean proteins  Prediabetes Insulin  resistance Discussed losing 10-15 % of body weight can help lower glucose and improve A1c Continue meal plan decreasing simple carbs. Discussed possible use of Metformin in the future if difficulty losing weight as will improve insulin  resistance  Class 2 severe obesity with serious comorbidity and body mass index (BMI) of 36.0 to 36.9 in adult, unspecified obesity type (HCC) Continue keto diet with 90 grams of protein and 20 carbs daily- aim for 1000-1100 calories daily. Drink lots of water and add lunch salad for extra fiber.  Is using 2 protein shake a day because she is having a lot of issues getting protein and was avoiding. Discussed losing 10-15 % of body weight to improve health conditions Discussed possible use of Metformin in the future if difficulty losing weight as will improve insulin  resistance Discussed incorporating walking as tolerated- currently states it is too painful    Generalized Anxiety Disorder Continue Abilify  5 mg every day, lexapro  5 mg 3 tabs daily, mirtazapine  15 mg at bedtime, and propranolol  20 mg BID as needed for anxiety.      Return in  about 2 weeks (around 01/13/2024).Sharon Cole She was informed of the importance of frequent follow up visits to maximize her success with intensive lifestyle modifications for her multiple health conditions.   ATTESTASTION STATEMENTS:  Reviewed by clinician on day of visit: allergies, medications, problem list, medical history, surgical history, family history, social history, and previous encounter notes.   Time spent on visit including pre-visit chart review and post-visit care and charting was 50 minutes.   Sharon Cole ANP-C

## 2023-12-30 NOTE — Progress Notes (Signed)
 Sharon Cole is a 68 y.o. female patient   12/30/2023  Treatment Plan: Diagnosis 296.32 (Major depressive affective disorder, recurrent episode, moderate) [n/a]  300.02 (Generalized anxiety disorder) [n/a]  Symptoms Depressed or irritable mood. (Status: maintained) -- No Description Entered  Feelings of hopelessness, worthlessness, or inappropriate guilt. (Status: maintained) -- No Description Entered  Lack of energy. (Status: maintained) -- No Description Entered  Low self-esteem. (Status: maintained) -- No Description Entered  Medication Status compliance  Safety none  If Suicidal or Homicidal State Action Taken: unspecified  Current Risk: low Medications Abilify  (Dosage: .25mg )  Buspar  (Dosage: 30mg )  Citalopram (Dosage: 20mg )  Topomax (Dosage: unknown)  Objectives Related Problem: Recognize, accept, and cope with feelings of depression. Description: Identify and replace thoughts and beliefs that support depression. Target Date: 2024-04-13 Frequency: Daily Modality: individual Progress: 80%  Related Problem: Recognize, accept, and cope with feelings of depression. Description: Learn and implement behavioral strategies to overcome depression. Target Date: 2024-04-13 Frequency: Daily Modality: individual Progress: 75%  Related Problem: Recognize, accept, and cope with feelings of depression. Description: Verbalize an understanding and resolution of current interpersonal problems. Target Date:  2024-04-13 Frequency: Daily Modality: individual Progress: 90%  Related Problem: Recognize, accept, and cope with feelings of depression. Description: Verbalize insight into how past relationships may be influencing current experiences with depression. Target Date: 2024-04-13 Frequency: Daily Modality: individual Progress: 90%  Client Response full compliance  Service Location Location, [address] [place], [place] [address]  Service Code cpt [phone]  Normalize/Reframe  Facilitate problem solving  Identify/label emotions  Validate/empathize  Emotion regulation skills  Self care activities  Lifestyle change (exercise, nutrition)  Self-monitoring  Identified an insight  Rationally challenge thoughts or beliefs/cognitive restructuring  Session notes:   Goals/Plan: Wants to work on being genuine and being satisfied with herself and develop stronger self-esteem. Also, would like to improve her primary relationship and have her interpersonal and romantic needs met. This goal is now met, as she had ended the relationship. Wants to continue self-care and maintain weight loss. Needs to develop strategy to manage  relationship with her son Sharon Cole, who struggles with mental health issues. Goal date 12-25. 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 Will also attempt to create a small, but satisfying social network with like-minded people. Goal date is 12-25.   Meds:Lexapro  15mg , Buspar , Abilify  5mg  , Remeron  Patient agrees to video Caregility session and is aware of the limitations of this platform. She is at home and I am at my home office.   Sharon Cole says she is not getting enough work hours, which is stressful. She went back to weight clinic and lost 3 pounds in 1 week following their plan. Very encouraged. School started yesterday and she says it is not too difficult at this point. She is only taking the one class, and feels she should be fine.                                                                                                                                                           CONI Sharon Cole [name], PhD  Time: 4:10p-5:00p 50 minutes.

## 2024-01-06 ENCOUNTER — Ambulatory Visit (INDEPENDENT_AMBULATORY_CARE_PROVIDER_SITE_OTHER): Admitting: Psychology

## 2024-01-06 DIAGNOSIS — F411 Generalized anxiety disorder: Secondary | ICD-10-CM

## 2024-01-06 DIAGNOSIS — F331 Major depressive disorder, recurrent, moderate: Secondary | ICD-10-CM

## 2024-01-06 NOTE — Progress Notes (Signed)
 Sharon Cole is a 68 y.o. female patient   01/06/2024  Treatment Plan: Diagnosis 296.32 (Major depressive affective disorder, recurrent episode, moderate) [n/a]  300.02 (Generalized anxiety disorder) [n/a]  Symptoms Depressed or irritable mood. (Status: maintained) -- No Description Entered  Feelings of hopelessness, worthlessness, or inappropriate guilt. (Status: maintained) -- No Description Entered  Lack of energy. (Status: maintained) -- No Description Entered  Low self-esteem. (Status: maintained) -- No Description Entered  Medication Status compliance  Safety none  If Suicidal or Homicidal State Action Taken: unspecified  Current Risk: low Medications Abilify  (Dosage: .25mg )  Buspar  (Dosage: 30mg )  Citalopram (Dosage: 20mg )  Topomax (Dosage: unknown)  Objectives Related Problem: Recognize, accept, and cope with feelings of depression. Description: Identify and replace thoughts and beliefs that support depression. Target Date: 2024-04-13 Frequency: Daily Modality: individual Progress: 80%  Related Problem: Recognize, accept, and cope with feelings of depression. Description: Learn and implement behavioral strategies to overcome depression. Target Date: 2024-04-13 Frequency: Daily Modality: individual Progress: 75%  Related Problem: Recognize, accept, and cope with feelings of depression. Description: Verbalize an understanding and resolution of current interpersonal  problems. Target Date: 2024-04-13 Frequency: Daily Modality: individual Progress: 90%  Related Problem: Recognize, accept, and cope with feelings of depression. Description: Verbalize insight into how past relationships may be influencing current experiences with depression. Target Date: 2024-04-13 Frequency: Daily Modality: individual Progress: 90%  Client Response full compliance  Service Location Location, [address] [place], [place] [address]  Service Code cpt [phone]  Normalize/Reframe  Facilitate problem solving  Identify/label emotions  Validate/empathize  Emotion regulation skills  Self care activities  Lifestyle change (exercise, nutrition)  Self-monitoring  Identified an insight  Rationally challenge thoughts or beliefs/cognitive restructuring  Session notes:   Goals/Plan: Wants to work on being genuine and being satisfied with herself and develop stronger self-esteem. Also, would like to improve her primary relationship and have her interpersonal and romantic needs met. This goal is now met, as she had ended the  relationship. Wants to continue self-care and maintain weight loss. Needs to develop strategy to manage relationship with her son Sharon Cole, who struggles with mental health issues. Goal date 12-25. 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 Will also attempt to create a small, but satisfying social network with like-minded people. Goal date is 12-25.   Meds:Lexapro  15mg , Buspar , Abilify  5mg  , Remeron  Patient agrees to video Caregility session and is aware of the limitations of this platform. She is at home and I am at my home office.   Sharon Cole says she had a good weekend taking care of her grandson. She states she is battling headaches the past number of days. Feels like on verge of getting sick without actually getting sick. Makes it tough to get on top of school. Hasn't taken any meds, but will go get it. Taled about Sharon Cole  and his continued problems. Says she is sick of Keto Diet.  Says she is fed up with all diets. She will have to explore foods that work he her.                                                                                                                                                            Sharon Sharon Cole [name], PhD  Time: 1:10p-2:00p 50 minutes.

## 2024-01-13 ENCOUNTER — Ambulatory Visit (INDEPENDENT_AMBULATORY_CARE_PROVIDER_SITE_OTHER): Admitting: Psychology

## 2024-01-13 DIAGNOSIS — F331 Major depressive disorder, recurrent, moderate: Secondary | ICD-10-CM | POA: Diagnosis not present

## 2024-01-13 DIAGNOSIS — F411 Generalized anxiety disorder: Secondary | ICD-10-CM | POA: Diagnosis not present

## 2024-01-13 NOTE — Progress Notes (Addendum)
 Sharon Cole is a 68 y.o. female patient   01/13/2024  Treatment Plan: Diagnosis 296.32 (Major depressive affective disorder, recurrent episode, moderate) [n/a]  300.02 (Generalized anxiety disorder) [n/a]  Symptoms Depressed or irritable mood. (Status: maintained) -- No Description Entered  Feelings of hopelessness, worthlessness, or inappropriate guilt. (Status: maintained) -- No Description Entered  Lack of energy. (Status: maintained) -- No Description Entered  Low self-esteem. (Status: maintained) -- No Description Entered  Medication Status compliance  Safety none  If Suicidal or Homicidal State Action Taken: unspecified  Current Risk: low Medications Abilify  (Dosage: .25mg )  Buspar  (Dosage: 30mg )  Citalopram (Dosage: 20mg )  Topomax (Dosage: unknown)  Objectives Related Problem: Recognize, accept, and cope with feelings of depression. Description: Identify and replace thoughts and beliefs that support depression. Target Date: 2024-04-13 Frequency: Daily Modality: individual Progress: 80%  Related Problem: Recognize, accept, and cope with feelings of depression. Description: Learn and implement behavioral strategies to overcome depression. Target Date: 2024-04-13 Frequency: Daily Modality: individual Progress: 75%  Related Problem: Recognize, accept, and cope with feelings of depression. Description: Verbalize an understanding and  resolution of current interpersonal problems. Target Date: 2024-04-13 Frequency: Daily Modality: individual Progress: 90%  Related Problem: Recognize, accept, and cope with feelings of depression. Description: Verbalize insight into how past relationships may be influencing current experiences with depression. Target Date: 2024-04-13 Frequency: Daily Modality: individual Progress: 90%  Client Response full compliance  Service Location Location, [address] [place], [place] [address]  Service Code cpt [phone]  Normalize/Reframe  Facilitate problem solving  Identify/label emotions  Validate/empathize  Emotion regulation skills  Self care activities  Lifestyle change (exercise, nutrition)  Self-monitoring  Identified an insight  Rationally challenge thoughts or beliefs/cognitive restructuring  Session notes:   Goals/Plan: Wants to work on being genuine and being satisfied with herself and develop stronger self-esteem. Also, would like to improve her primary relationship and have her  interpersonal and romantic needs met. This goal is now met, as she had ended the relationship. Wants to continue self-care and maintain weight loss. Needs to develop strategy to manage relationship with her son Sharon, who struggles with mental health issues. Goal date 12-25. 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 Will also attempt to create a small, but satisfying social network with like-minded people. Goal date is 12-25.   Meds:Lexapro  15mg , Buspar , Abilify  5mg  , Remeron  Patient agrees to video Caregility session and is aware of the limitations of this platform. She is at home and I am at my home office.   Sharon Cole says she feels stressed about son Sharon Cole. She has not talked with him in 2 weeks and has no news. Her ex-husband called her to see if she has heard anything. He is worried sick and feels responsible because of his decision to cut him off financially.  Sharon Cole has thought about how challenging it would be if Sharon Cole asked her if he could move in. Talked about best strategies to respond to Sharon Cole if he inquires about moving in with her. She starts internship training tomorrow.                                                                                                                                                               CONI Sharon [name], PhD  Time: 4:15p-5:00p 45 minutes.

## 2024-01-14 ENCOUNTER — Encounter (HOSPITAL_COMMUNITY): Payer: Self-pay | Admitting: Student in an Organized Health Care Education/Training Program

## 2024-01-14 ENCOUNTER — Ambulatory Visit (HOSPITAL_BASED_OUTPATIENT_CLINIC_OR_DEPARTMENT_OTHER): Admitting: Student in an Organized Health Care Education/Training Program

## 2024-01-14 DIAGNOSIS — F3342 Major depressive disorder, recurrent, in full remission: Secondary | ICD-10-CM | POA: Diagnosis not present

## 2024-01-14 DIAGNOSIS — F331 Major depressive disorder, recurrent, moderate: Secondary | ICD-10-CM | POA: Diagnosis not present

## 2024-01-14 DIAGNOSIS — F411 Generalized anxiety disorder: Secondary | ICD-10-CM | POA: Diagnosis not present

## 2024-01-14 MED ORDER — ESCITALOPRAM OXALATE 5 MG PO TABS: 15.0000 mg | ORAL_TABLET | Freq: Every day | ORAL | 3 refills | Status: DC

## 2024-01-14 MED ORDER — GABAPENTIN 100 MG PO CAPS: 100.0000 mg | ORAL_CAPSULE | Freq: Every day | ORAL | 2 refills | Status: DC | PRN

## 2024-01-14 MED ORDER — BUSPIRONE HCL 30 MG PO TABS: 30.0000 mg | ORAL_TABLET | Freq: Two times a day (BID) | ORAL | 2 refills | Status: DC

## 2024-01-14 MED ORDER — ARIPIPRAZOLE 5 MG PO TABS: 5.0000 mg | ORAL_TABLET | Freq: Every day | ORAL | 1 refills | Status: DC

## 2024-01-14 NOTE — Patient Instructions (Signed)
 [facility] Patient Name: Sharon Cole Date of Visit: [01/14/24  Provider Name: Dr. Homer  Dear Sharon Cole, it was a pleasure seeing you today, and we appreciate the opportunity to care for you.   These are the changes we have made to your medications:  Decrease your Remeron  dose to half a tablet (7.5 mg) nightly for 2 weeks then discontinue the medication   Lifestyle Recommendations Sleep Hygiene: Aim for 7-9 hours of quality sleep each night. Establish a regular sleep routine and create a restful environment. Stress Management: Practice stress-reducing techniques such as mindfulness, meditation, deep breathing exercises, or hobbies that you enjoy. Smoking Cessation: If you smoke, consider quitting. We can provide resources and support to help you. Alcohol Consumption: Limit alcohol intake to moderate levels--up to one drink per day for women and up to two drinks per day for men. Routine Health Screenings: Stay up-to-date with routine health screenings and vaccinations as recommended.

## 2024-01-14 NOTE — Progress Notes (Signed)
 BH MD Outpatient Progress Note  01/14/2024 11:22 AM Sharon Cole  MRN:  [id number]  Assessment:  Sharon Cole presents for follow-up evaluation in-person on 01/14/24 . Formally a patient of Dr. Schuyler, last seen on 10/01/2023.  At this visit, the patient reports continued stabilization of mood and anxiety symptoms since the last assessment. Gabapentin  and propranolol  remain appropriate as as-needed agents, and patient reports near resolution of acute anxiety symptoms on this current regimen. Remeron  taper is ongoing as previously planned, with no withdrawal symptoms or destabilization noted.   Identifying Information: Sharon Cole is a 68 y.o. female with a history of GAD, cannabis use disorder, MDD who is an established patient with [facility] for management of anxiety and depression. For a comprehensive history and detailed assessment, please refer to the initial adult assessment.  The patient's PMHx is significant for obesity, HLD, and prediabetes.   Plan:  # Generalized anxiety disorder  Hx major depressive disorder, moderate Interventions: -- Continue Lexapro  15 mg daily - Continue Abilify  5 mg nightly for augmentation of antidepressant medications - Continue buspirone  30 mg BID -- Continue gabapentin  100 mg daily PRN - Continue propranolol  20 mg BID PRN - Decrease Remeron  from 15 mg to 7.5 mg nightly for 2 weeks then stop - Continue therapy with Dr. Gutterman   # Cannabis use disorder, in early remission Interventions: -- Quit ~June 2024   #Long term use of antipsychotic medication -- Lipid panel from November 2024, LDL 125 - A1c from November 2024, 5.9 - EKG unremarkable 08/19/2023    Patient was given contact information for behavioral health clinic and was instructed to call 911 for emergencies.   Subjective:  Chief Complaint:  Chief Complaint  Patient presents with   Medication Refill   Follow-up   Stress    Interval  History:  The patient reports ongoing stress related to her adult son's current life challenges, which have been a persistent concern in previous visits. She expresses anxiety about potential changes in his living situation that could impact her home environment, noting that setting boundaries around this issue has been difficult. She also reports ongoing workplace stressors and challenging interpersonal dynamics, which further contribute to her emotional distress. She finds therapy sessions to be cathartic and helpful for processing these stressors.  She reports that gabapentin  has been effective in reducing her anxiety symptoms. She denies adverse effects from her medications and states that she is compliant with her regimen. Sleep is reported as adequate, averaging 7-9 hours per night without nightmares. Appetite is stable; she typically consumes protein shakes and yogurt during the day and has a regular dinner. She is currently participating in a healthy weight and wellness program. Caffeine intake is limited to two cups of caffeinated beverages daily. She denies recent substance use.  She denies any suicidal ideations.  Visit Diagnosis:    ICD-10-CM   1. GAD (generalized anxiety disorder)  F41.1 ARIPiprazole  (ABILIFY ) 5 MG tablet    busPIRone  (BUSPAR ) 30 MG tablet    escitalopram  (LEXAPRO ) 5 MG tablet    gabapentin  (NEURONTIN ) 100 MG capsule    2. Recurrent major depressive disorder, in full remission (HCC)  F33.42 ARIPiprazole  (ABILIFY ) 5 MG tablet    escitalopram  (LEXAPRO ) 5 MG tablet    3. Moderate episode of recurrent major depressive disorder (HCC)  F33.1 busPIRone  (BUSPAR ) 30 MG tablet        Social History: Lives in alone in [place] No pets 2 adult sons, she also  has grandchildren Social Support: her children Gela Charges: No No [employer] hx Firearms: Denies  Social History   Socioeconomic History   Marital status: Significant Other    Spouse name: Not on file    Number of children: 2   Years of education: Masters   Highest education level: Master's degree (e.g., MA, MS, MEng, MEd, MSW, MBA)  Occupational History   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   Occupation: Consulting civil engineer  Tobacco Use   Smoking status: Never   Smokeless tobacco: Never  Vaping Use   Vaping status: Never Used  Substance and Sexual Activity   Alcohol use: Yes    Alcohol/week: 1.0 standard drink of alcohol    Types: 1 Standard drinks or equivalent per week   Drug use: No   Sexual activity: Not Currently    Partners: Male  Other Topics Concern   Not on file  Social History Narrative   Lives alone/2025   Caffeine use:    Drinks 16oz caffeine drinks a day    Social Drivers of Corporate investment banker Strain: Low Risk  (11/21/2023)   Overall Financial Resource Strain (CARDIA)    Difficulty of Paying Living Expenses: Not hard at all  Food Insecurity: No Food Insecurity (11/21/2023)   Hunger Vital Sign    Worried About Running Out of Food in the Last Year: Never true    Ran Out of Food in the Last Year: Never true  Transportation Needs: No Transportation Needs (11/21/2023)   PRAPARE - Administrator, Civil Service (Medical): No    Lack of Transportation (Non-Medical): No  Physical Activity: Inactive (11/21/2023)   Exercise Vital Sign    Days of Exercise per Week: 0 days    Minutes of Exercise per Session: Not on file  Stress: No Stress Concern Present (11/21/2023)   [employer] of Occupational Health - Occupational Stress Questionnaire    Feeling of Stress: Only a little  Social Connections: Socially Isolated (11/21/2023)   Social Connection and Isolation Panel    Frequency of Communication with Friends and Family: Once a week    Frequency of Social Gatherings with Friends and Family: Once a week    Attends Religious Services: Never    Database administrator or Organizations: No    Attends Engineer, structural: Not on file     Marital Status: Divorced    Allergies: No Known Allergies  Current Medications: Current Outpatient Medications  Medication Sig Dispense Refill   Apoaequorin (PREVAGEN EXTRA STRENGTH PO) Take by mouth.     ARIPiprazole  (ABILIFY ) 5 MG tablet Take 1 tablet (5 mg total) by mouth daily. 30 tablet 1   busPIRone  (BUSPAR ) 30 MG tablet Take 1 tablet (30 mg total) by mouth 2 (two) times daily. 60 tablet 2   escitalopram  (LEXAPRO ) 5 MG tablet Take 3 tablets (15 mg total) by mouth daily. 180 tablet 3   gabapentin  (NEURONTIN ) 100 MG capsule Take 1 capsule (100 mg total) by mouth daily as needed. 30 capsule 2   Multiple Vitamins-Minerals (MULTIVITAMIN PO) Take 1 tablet by mouth daily.     Omeprazole  Magnesium (PRILOSEC PO)      propranolol  (INDERAL ) 10 MG tablet Take 10 mg by mouth 2 (two) times daily.     valACYclovir  (VALTREX ) 500 MG tablet Take 500 mg by mouth 2 (two) times daily.     No current facility-administered medications for this visit.    ROS: Review of Systems  All other systems reviewed  and are negative.   Objective:  Objective: Psychiatric Specialty Exam: General Appearance: Casual, fairly groomed  Eye Contact:  Good    Speech:  Clear, coherent, normal rate, spontaneous  Volume:  Normal   Mood:  see above  Affect:  congruent, full range  Thought Content: Logical,   Suicidal Thoughts: see subjective  Thought Process:  Coherent, goal-directed,   Orientation:  A&Ox4   Memory:  Immediate good  Judgment:  Good  Insight:  Fair  Concentration:  Attention and concentration good   Recall:  Good  Fund of Knowledge: Good  Language: Good, fluent  Psychomotor Activity: Normal  Akathisia:  NA   AIMS (if indicated): NA   Assets:   Communication Skills Desire for Improvement Resilience  ADL's:  Intact  Cognition: WNL  Sleep: see above  Appetite: see above    Physical Exam Constitutional:      General: She is not in acute distress.    Appearance: She is not  ill-appearing.  HENT:     Head: Normocephalic and atraumatic.  Eyes:     Extraocular Movements: Extraocular movements intact.     Conjunctiva/sclera: Conjunctivae normal.  Pulmonary:     Effort: Pulmonary effort is normal. No respiratory distress.  Neurological:     General: No focal deficit present.     Mental Status: She is alert and oriented to person, place, and time.      Metabolic Disorder Labs: Lab Results  Component Value Date   HGBA1C 5.7 08/19/2023   No results found for: PROLACTIN Lab Results  Component Value Date   CHOL 184 08/19/2023   TRIG 118.0 08/19/2023   HDL 55.80 08/19/2023   CHOLHDL 3 08/19/2023   VLDL 23.6 08/19/2023   LDLCALC 105 (H) 08/19/2023   LDLCALC 125 (H) 03/25/2023   Lab Results  Component Value Date   TSH 2.30 08/19/2023   TSH 1.760 02/05/2022    Therapeutic Level Labs: No results found for: LITHIUM No results found for: VALPROATE No results found for: CBMZ  Screenings:  GAD-7    Flowsheet Row [facility]  Total GAD-7 Score 6   PHQ2-9    Flowsheet Row Office Visit from 12/15/2023 in [facility] Visit from 11/24/2023 in [facility] Clinical Support from 11/21/2023 in [facility] [place] Office Visit from 10/06/2023 in [facility] [place] Office Visit from 08/19/2023 in [facility] [place]  PHQ-2 Total Score 0 0 3 0 0  PHQ-9 Total Score 0 1 3 -- 0    Collaboration of Care:   Patient/Guardian was advised Release of Information must be obtained prior to any record release in order to collaborate their care with an outside provider. Patient/Guardian was advised if they have not already done so to contact the registration department to sign all necessary forms in order for us  to release information  regarding their care.   Consent: Patient/Guardian gives verbal consent for treatment and assignment of benefits for services provided during this visit. Patient/Guardian expressed understanding and agreed to proceed.    Marlo Masson, MD 01/14/2024, 11:22 AM

## 2024-01-14 NOTE — Addendum Note (Signed)
 Addended by: CARVIN CROCK on: 01/14/2024 02:23 PM   Modules accepted: Level of Service

## 2024-01-15 ENCOUNTER — Encounter: Admitting: Podiatry

## 2024-01-20 ENCOUNTER — Ambulatory Visit: Admitting: Psychology

## 2024-01-20 DIAGNOSIS — F411 Generalized anxiety disorder: Secondary | ICD-10-CM

## 2024-01-20 NOTE — Progress Notes (Signed)
 Sharon Cole is a 68 y.o. female patient   01/20/2024  Treatment Plan: Diagnosis 296.32 (Major depressive affective disorder, recurrent episode, moderate) [n/a]  300.02 (Generalized anxiety disorder) [n/a]  Symptoms Depressed or irritable mood. (Status: maintained) -- No Description Entered  Feelings of hopelessness, worthlessness, or inappropriate guilt. (Status: maintained) -- No Description Entered  Lack of energy. (Status: maintained) -- No Description Entered  Low self-esteem. (Status: maintained) -- No Description Entered  Medication Status compliance  Safety none  If Suicidal or Homicidal State Action Taken: unspecified  Current Risk: low Medications Abilify  (Dosage: .25mg )  Buspar  (Dosage: 30mg )  Citalopram (Dosage: 20mg )  Topomax (Dosage: unknown)  Objectives Related Problem: Recognize, accept, and cope with feelings of depression. Description: Identify and replace thoughts and beliefs that support depression. Target Date: 2024-04-13 Frequency: Daily Modality: individual Progress: 80%  Related Problem: Recognize, accept, and cope with feelings of depression. Description: Learn and implement behavioral strategies to overcome depression. Target Date: 2024-04-13 Frequency: Daily Modality: individual Progress: 75%  Related Problem: Recognize, accept, and cope with feelings of depression. Description: Verbalize an understanding and resolution of current interpersonal problems. Target Date: 2024-04-13 Frequency: Daily Modality: individual Progress: 90%  Related Problem: Recognize, accept, and cope with feelings of depression. Description: Verbalize insight into how past relationships may be influencing current experiences with depression. Target Date: 2024-04-13 Frequency: Daily Modality: individual Progress: 90%  Client Response full compliance  Service Location Location, [address] [place], [place] [address]  Service Code cpt [phone]   Normalize/Reframe  Facilitate problem solving  Identify/label emotions  Validate/empathize  Emotion regulation skills  Self care activities  Lifestyle change (exercise, nutrition)  Self-monitoring  Identified an insight  Rationally challenge thoughts or beliefs/cognitive restructuring  Session notes:   Goals/Plan: Wants to work on being genuine and being satisfied with herself and develop stronger self-esteem. Also, would like to improve her primary relationship and have her interpersonal and romantic needs met. This goal is now met, as she had ended the relationship. Wants to continue self-care and maintain weight loss. Needs to develop strategy to manage relationship with her son Sharon Cole, who struggles with mental health issues. Goal date 12-25. 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 Will also attempt to create a small, but satisfying social network with like-minded people. Goal date is 12-25.   Meds:Lexapro  15mg , Buspar , Abilify  5mg  , Remeron  Patient agrees to video Caregility session and is aware of the limitations of this platform. She is at home and I am at my home office.   Sharon Cole reports that Sharon Cole fears that Sharon Cole is suicidal (though they have not talked) and decided to fly in today. Sharon Cole will go over to his house to talk with him. We discussed her role to stay out of the middle, so not to [employer]. She thinks Sharon Cole will cave if Sharon Cole asks for help. The enabling is dangerous. 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  CONI Sharon Cole [name], PhD  Time: 1:10p-2:00p 50 minutes.                              CONI Sharon Cole [name], PhD

## 2024-01-21 ENCOUNTER — Ambulatory Visit (INDEPENDENT_AMBULATORY_CARE_PROVIDER_SITE_OTHER): Admitting: Nurse Practitioner

## 2024-01-21 ENCOUNTER — Encounter (INDEPENDENT_AMBULATORY_CARE_PROVIDER_SITE_OTHER): Payer: Self-pay | Admitting: Nurse Practitioner

## 2024-01-21 VITALS — BP 97/60 | HR 64 | Temp 97.9°F | Ht 64.5 in | Wt 220.0 lb

## 2024-01-21 DIAGNOSIS — E66812 Obesity, class 2: Secondary | ICD-10-CM | POA: Diagnosis not present

## 2024-01-21 DIAGNOSIS — E7800 Pure hypercholesterolemia, unspecified: Secondary | ICD-10-CM

## 2024-01-21 DIAGNOSIS — E88819 Insulin resistance, unspecified: Secondary | ICD-10-CM | POA: Diagnosis not present

## 2024-01-21 DIAGNOSIS — Z6837 Body mass index (BMI) 37.0-37.9, adult: Secondary | ICD-10-CM

## 2024-01-21 DIAGNOSIS — R7303 Prediabetes: Secondary | ICD-10-CM

## 2024-01-21 NOTE — Progress Notes (Signed)
 Office: [phone]  /  Fax: [phone]  WEIGHT SUMMARY AND BIOMETRICS  Weight Lost Since Last Visit: 0  Weight Gained Since Last Visit: 6 lb   Vitals Temp: 97.9 F (36.6 C) BP: 97/60 Pulse Rate: 64 SpO2: 92 %   Anthropometric Measurements Height: 5' 4.5 (1.638 m) Weight: 220 lb (99.8 kg) BMI (Calculated): 37.19 Weight at Last Visit: 241 lb Weight Lost Since Last Visit: 0 Weight Gained Since Last Visit: 6 lb Starting Weight: 214 lb Total Weight Loss (lbs): 0 lb (0 kg)   Body Composition  Body Fat %: 49.2 % Fat Mass (lbs): 108.6 lbs Muscle Mass (lbs): 106.6 lbs Total Body Water (lbs): 84.2 lbs Visceral Fat Rating : 15   Other Clinical Data Fasting: no Labs: no Today's Visit #: 3 Starting Date: 12/15/23    Total Weight Loss: 0 Percent of body weight lost: 0 Bio Impedence Data: Down 8.6 pounds of muscle and up 15.4 pounds of adipose- do not believe this is an accurate measurement, will reevaluate next visit   HPI  Chief Complaint: OBESITY  Sharon Cole is here to discuss her progress with her obesity treatment plan. She is on the keto plan and states she is following her eating plan approximately 0 % of the time. She states she is not currently exercising   Interval History:  Since last office visit she has been struggling with her will to do a Keto plan, she initially thought this was the plan which she could follow the best. Unfortunately she has been struggling with consuming her previous choices of lean protein sources. She does have a lot of food aversions from multiple previous weight loss attempts.  Was also have difficulty getting all her calories/meals.  Continued to have issues with skipping meals. She has used protein shakes 1-2 times a day. She is very interested in using tofu/tempeh as her protein source. She has not yet started exercise.     PHYSICAL EXAM:  Blood pressure 97/60, pulse 64, temperature 97.9 F (36.6 C), height 5' 4.5 (1.638 m),  weight 220 lb (99.8 kg), SpO2 92%. Body mass index is 37.18 kg/m.  General: Well Developed, well nourished, and in no acute distress.  HEENT: Normocephalic, atraumatic; EOMI, sclerae are anicteric. Skin: Warm and dry, good turgor Chest:  Normal excursion, shape, no gross ABN Respiratory: No conversational dyspnea; speaking in full sentences NeuroM-Sk:  Normal gross ROM * 4 extremities  Psych: A and O X 3, insight adequate, mood- full    DIAGNOSTIC DATA REVIEWED:  BMET    Component Value Date/Time   NA 141 08/19/2023 0932   K 4.1 08/19/2023 0932   CL 106 08/19/2023 0932   CO2 28 08/19/2023 0932   GLUCOSE 104 (H) 08/19/2023 0932   BUN 13 08/19/2023 0932   CREATININE 0.85 08/19/2023 0932   CREATININE 0.60 12/22/2013 1045   CALCIUM 9.4 08/19/2023 0932   GFRNONAA >60 08/06/2016 1207   GFRAA >60 08/06/2016 1207   Lab Results  Component Value Date   HGBA1C 5.7 08/19/2023   HGBA1C 5.7 (H) 02/05/2022   Lab Results  Component Value Date   INSULIN  9.1 12/15/2023   Lab Results  Component Value Date   TSH 2.30 08/19/2023   CBC    Component Value Date/Time   WBC 7.3 08/19/2023 0932   RBC 5.28 (H) 08/19/2023 0932   HGB 14.4 08/19/2023 0932   HCT 44.1 08/19/2023 0932   PLT 215.0 08/19/2023 0932   MCV 83.5 08/19/2023 0932   MCH  27.6 08/06/2016 1207   MCHC 32.6 08/19/2023 0932   RDW 15.2 08/19/2023 0932   Iron Studies No results found for: IRON, TIBC, FERRITIN, IRONPCTSAT Lipid Panel     Component Value Date/Time   CHOL 184 08/19/2023 0932   CHOL 198 03/25/2023 1014   TRIG 118.0 08/19/2023 0932   HDL 55.80 08/19/2023 0932   HDL 55 03/25/2023 1014   CHOLHDL 3 08/19/2023 0932   VLDL 23.6 08/19/2023 0932   LDLCALC 105 (H) 08/19/2023 0932   LDLCALC 125 (H) 03/25/2023 1014   Hepatic Function Panel     Component Value Date/Time   PROT 6.8 08/19/2023 0932   ALBUMIN 4.4 08/19/2023 0932   AST 21 08/19/2023 0932   ALT 24 08/19/2023 0932   ALKPHOS 78  08/19/2023 0932   BILITOT 0.4 08/19/2023 0932   BILIDIR 0.0 02/13/2015 1542      Component Value Date/Time   TSH 2.30 08/19/2023 0932   Nutritional Lab Results  Component Value Date   VD25OH 45.6 12/15/2023   VD25OH 37.24 01/02/2015   VD25OH 57 12/22/2013     ASSESSMENT AND PLAN  TREATMENT PLAN FOR OBESITY:  Recommended Dietary Goals  Airel is currently in the action stage of change. As such, her goal is to continue weight management plan. She has agreed to the Vegetarian Plan.  Behavioral Intervention  We discussed the following Behavioral Modification Strategies today: avoiding skipping meals, continue to work on maintaining a reduced calorie state, getting the recommended amount of protein, incorporating whole foods, making healthy choices, staying well hydrated and practicing mindfulness when eating., and increase protein intake, fibrous foods (25 grams per day for women, 30 grams for men) and water to improve satiety and decrease hunger signals. .  Additional resources provided today: NA  Recommended Physical Activity Goals  Lateefa has been advised to work up to 150 minutes of moderate intensity aerobic activity a week and strengthening exercises 2-3 times per week for cardiovascular health, weight loss maintenance and preservation of muscle mass.   She has agreed to Think about enjoyable ways to increase daily physical activity and overcoming barriers to exercise and Increase physical activity in their day and reduce sedentary time (increase NEAT).   Pharmacotherapy We discussed various medication options to help Meira with her weight loss efforts and we both agreed to continue to focus on nutrition and behavior modification.  ASSOCIATED CONDITIONS ADDRESSED TODAY  Action/Plan  Pure hypercholesterolemia Initiate vegetarian meal plan with goal of 1000 calories and 80 grams of protein Limit saturated fats Loss of 10-15% of body weight can improve lipid  levels  Prediabetes Insulin  resistance Initiate vegetarian meal plan with goal of 1000 calories and 80 grams of protein Limit simple carbohydrates Loss of 10-15% of body weight can improve glucose/insulin  levels  Class 2 severe obesity with serious comorbidity and body mass index (BMI) of 37.0 to 37.9 in adult, unspecified obesity type (HCC) See plan above Counseled in depth on use of CHATGPT and skinnytaste for recipes focusing on tofu/tempeh as protein sources. She was printed a 7 day meal plan focusing on 1000 calories 80 grams of protein in vegetarian style with soy protein        No follow-ups on file.SABRA She was informed of the importance of frequent follow up visits to maximize her success with intensive lifestyle modifications for her multiple health conditions.   ATTESTASTION STATEMENTS:  Reviewed by clinician on day of visit: allergies, medications, problem list, medical history, surgical history, family history, social history,  and previous encounter notes.   I personally spent a total of 37 minutes in the care of the patient today including preparing to see the patient, getting/reviewing separately obtained history, performing a medically appropriate exam/evaluation, counseling and educating, documenting clinical information in the EHR, independently interpreting results, and coordinating care.   Preslie Depasquale ANP-C

## 2024-01-27 ENCOUNTER — Ambulatory Visit: Attending: Cardiovascular Disease | Admitting: Cardiovascular Disease

## 2024-01-27 ENCOUNTER — Encounter: Payer: Self-pay | Admitting: Cardiovascular Disease

## 2024-01-27 ENCOUNTER — Ambulatory Visit: Admitting: Psychology

## 2024-01-27 VITALS — BP 100/60 | HR 69 | Ht 64.5 in | Wt 228.0 lb

## 2024-01-27 DIAGNOSIS — E782 Mixed hyperlipidemia: Secondary | ICD-10-CM | POA: Insufficient documentation

## 2024-01-27 DIAGNOSIS — Z8249 Family history of ischemic heart disease and other diseases of the circulatory system: Secondary | ICD-10-CM | POA: Diagnosis not present

## 2024-01-27 DIAGNOSIS — R600 Localized edema: Secondary | ICD-10-CM | POA: Diagnosis present

## 2024-01-27 NOTE — Patient Instructions (Signed)
 Medication Instructions:  Your physician recommends that you continue on your current medications as directed. Please refer to the Current Medication list given to you today.  *If you need a refill on your cardiac medications before your next appointment, please call your pharmacy*  Testing/Procedures: Your physician has requested that you have an echocardiogram. Echocardiography is a painless test that uses sound waves to create images of your heart. It provides your doctor with information about the size and shape of your heart and how well your heart's chambers and valves are working. This procedure takes approximately one hour. There are no restrictions for this procedure. Please do NOT wear cologne, perfume, aftershave, or lotions (deodorant is allowed). Please arrive 15 minutes prior to your appointment time.  Please note: We ask at that you not bring children with you during ultrasound (echo/ vascular) testing. Due to room size and safety concerns, children are not allowed in the ultrasound rooms during exams. Our front office staff cannot provide observation of children in our lobby area while testing is being conducted. An adult accompanying a patient to their appointment will only be allowed in the ultrasound room at the discretion of the ultrasound technician under special circumstances. We apologize for any inconvenience.  Dr. Court has ordered a CT coronary calcium score.   Test locations:  [facility] at [facility]  [place] [facility] at [facility]  This is $99 out of pocket.   Coronary CalciumScan A coronary calcium scan is an imaging test used to look for deposits of calcium and other fatty materials (plaques) in the inner lining of the blood vessels of the heart (coronary arteries). These deposits of calcium and plaques can partly clog and narrow the coronary arteries without  producing any symptoms or warning signs. This puts a person at risk for a heart attack. This test can detect these deposits before symptoms develop. Tell a health care provider about: Any allergies you have. All medicines you are taking, including vitamins, herbs, eye drops, creams, and over-the-counter medicines. Any problems you or family members have had with anesthetic medicines. Any blood disorders you have. Any surgeries you have had. Any medical conditions you have. Whether you are pregnant or may be pregnant. What are the risks? Generally, this is a safe procedure. However, problems may occur, including: Harm to a pregnant woman and her unborn baby. This test involves the use of radiation. Radiation exposure can be dangerous to a pregnant woman and her unborn baby. If you are pregnant, you generally should not have this procedure done. Slight increase in the risk of cancer. This is because of the radiation involved in the test. What happens before the procedure? No preparation is needed for this procedure. What happens during the procedure? You will undress and remove any jewelry around your neck or chest. You will put on a hospital gown. Sticky electrodes will be placed on your chest. The electrodes will be connected to an electrocardiogram (ECG) machine to record a tracing of the electrical activity of your heart. A CT scanner will take pictures of your heart. During this time, you will be asked to lie still and hold your breath for 2-3 seconds while a picture of your heart is being taken. The procedure may vary among health care providers and hospitals. What happens after the procedure? You can get dressed. You can return to your normal activities. It is up to you to get the  results of your test. Ask your health care provider, or the department that is doing the test, when your results will be ready. Summary A coronary calcium scan is an imaging test used to look for deposits of  calcium and other fatty materials (plaques) in the inner lining of the blood vessels of the heart (coronary arteries). Generally, this is a safe procedure. Tell your health care provider if you are pregnant or may be pregnant. No preparation is needed for this procedure. A CT scanner will take pictures of your heart. You can return to your normal activities after the scan is done. This information is not intended to replace advice given to you by your health care provider. Make sure you discuss any questions you have with your health care provider. Document Released: 10/19/2007 Document Revised: 03/11/2016 Document Reviewed: 03/11/2016 Elsevier Interactive Patient Education  2017 [employer].   Follow-Up: At [facility], you and your health needs are our priority.  As part of our continuing mission to provide you with exceptional heart care, our providers are all part of one team.  This team includes your primary Cardiologist (physician) and Advanced Practice Providers or APPs (Physician Assistants and Nurse Practitioners) who all work together to provide you with the care you need, when you need it.  Your next appointment:   12 month(s)  Provider:   Dorn Lesches, MD   We recommend signing up for the patient portal called MyChart.  Sign up information is provided on this After Visit Summary.  MyChart is used to connect with patients for Virtual Visits (Telemedicine).  Patients are able to view lab/test results, encounter notes, upcoming appointments, etc.  Non-urgent messages can be sent to your provider as well.   To learn more about what you can do with MyChart, go to [email or web address].

## 2024-01-27 NOTE — Progress Notes (Signed)
 01/27/2024 AMIAH Cole   09/28/1955  [id number]  Primary Physician Lendia Boby CROME, NP-C Primary Cardiologist: Dorn JINNY Lesches MD [facility] [place], [place]  HPI:  Sharon Cole is a 68 y.o.  moderately overweight divorced Caucasian female mother of 2 children, grandmother to 2 grandchildren who is a Museum/gallery exhibitions officer.  She has stopped doing that work and now works at [employer] and is Landscape architect in social work.  She was referred to me by Dr. Brooks for evaluation of bradycardia and syncope. Her cardiac risk factor for profile is notable for family history with a mother who had CAD and a brother who had a past history of CABG at age 8. She does not smoke nor she diabetic, hypertensive or hyperlipidemic. She's never had a heart attack or stroke and denies chest pain or shortness of breath. She had a episode of syncope 2 weeks ago in the evening prior to going to bed. She also apparently was not taking her Ativan  for several days and was experiencing vertigo. There was no loss of bowel or bladder control.  Since I saw her 10 years ago she remained stable.  She was briefly engaged and broke off the engagement.  She is back at work now at [employer] and is getting a Scientist, water quality in social work.  She put on 50 pound since I last saw her.  She does complain of some bilateral ankle edema.  She denies chest pain or shortness of breath.  She really does not exercise since having recent foot surgery.   Current Meds  Medication Sig   Apoaequorin (PREVAGEN EXTRA STRENGTH PO) Take by mouth.   ARIPiprazole  (ABILIFY ) 5 MG tablet Take 1 tablet (5 mg total) by mouth daily.   busPIRone  (BUSPAR ) 30 MG tablet Take 1 tablet (30 mg total) by mouth 2 (two) times daily.   escitalopram  (LEXAPRO ) 5 MG tablet Take 3 tablets (15 mg total) by mouth daily.   gabapentin  (NEURONTIN ) 100 MG capsule Take 1 capsule (100 mg total) by mouth daily as needed.   Multiple Vitamins-Minerals (MULTIVITAMIN PO) Take 1 tablet  by mouth daily.   Omeprazole  Magnesium (PRILOSEC PO)    propranolol  (INDERAL ) 10 MG tablet Take 10 mg by mouth 2 (two) times daily.   valACYclovir  (VALTREX ) 500 MG tablet Take 500 mg by mouth 2 (two) times daily.     No Known Allergies  Social History   Socioeconomic History   Marital status: Significant Other    Spouse name: Not on file   Number of children: 2   Years of education: Masters   Highest education level: Master's degree (e.g., MA, MS, MEng, MEd, MSW, MBA)  Occupational History   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   Occupation: Consulting civil engineer  Tobacco Use   Smoking status: Never   Smokeless tobacco: Never  Vaping Use   Vaping status: Never Used  Substance and Sexual Activity   Alcohol use: Yes    Alcohol/week: 1.0 standard drink of alcohol    Types: 1 Standard drinks or equivalent per week   Drug use: No   Sexual activity: Not Currently    Partners: Male  Other Topics Concern   Not on file  Social History Narrative   Lives alone/2025   Caffeine use:    Drinks 16oz caffeine drinks a day    Social Drivers of Corporate investment banker Strain: Low Risk  (11/21/2023)   Overall Financial Resource Strain (CARDIA)    Difficulty of Paying Living  Expenses: Not hard at all  Food Insecurity: No Food Insecurity (11/21/2023)   Hunger Vital Sign    Worried About Running Out of Food in the Last Year: Never true    Ran Out of Food in the Last Year: Never true  Transportation Needs: No Transportation Needs (11/21/2023)   PRAPARE - Administrator, Civil Service (Medical): No    Lack of Transportation (Non-Medical): No  Physical Activity: Inactive (11/21/2023)   Exercise Vital Sign    Days of Exercise per Week: 0 days    Minutes of Exercise per Session: Not on file  Stress: No Stress Concern Present (11/21/2023)   [employer] of Occupational Health - Occupational Stress Questionnaire    Feeling of Stress: Only a little  Social Connections:  Socially Isolated (11/21/2023)   Social Connection and Isolation Panel    Frequency of Communication with Friends and Family: Once a week    Frequency of Social Gatherings with Friends and Family: Once a week    Attends Religious Services: Never    Database administrator or Organizations: No    Attends Engineer, structural: Not on file    Marital Status: Divorced  Intimate Partner Violence: Patient Unable To Answer (11/21/2023)   Humiliation, Afraid, Rape, and Kick questionnaire    Fear of Current or Ex-Partner: Patient unable to answer    Emotionally Abused: Patient unable to answer    Physically Abused: Patient unable to answer    Sexually Abused: Patient unable to answer     Review of Systems: General: negative for chills, fever, night sweats or weight changes.  Cardiovascular: negative for chest pain, dyspnea on exertion, edema, orthopnea, palpitations, paroxysmal nocturnal dyspnea or shortness of breath Dermatological: negative for rash Respiratory: negative for cough or wheezing Urologic: negative for hematuria Abdominal: negative for nausea, vomiting, diarrhea, bright red blood per rectum, melena, or hematemesis Neurologic: negative for visual changes, syncope, or dizziness All other systems reviewed and are otherwise negative except as noted above.    Blood pressure 100/60, pulse 69, height 5' 4.5 (1.638 m), weight 228 lb (103.4 kg).  General appearance: alert and no distress Neck: no adenopathy, no carotid bruit, no JVD, supple, symmetrical, trachea midline, and thyroid  not enlarged, symmetric, no tenderness/mass/nodules Lungs: clear to auscultation bilaterally Heart: regular rate and rhythm, S1, S2 normal, no murmur, click, rub or gallop Extremities: extremities normal, atraumatic, no cyanosis or edema Pulses: 2+ and symmetric Skin: Skin color, texture, turgor normal. No rashes or lesions Neurologic: Grossly normal  EKG not performed today      ASSESSMENT  AND PLAN:   Hyperlipidemia Her most recent lipid profile performed/14/25 revealed total cholesterol 184, LDL 105 and HDL 55 not on statin therapy.  I am going to get a coronary calcium score to her stratify.  Lower extremity edema 1+ ankle/pretibial edema.  She does not give symptoms of heart failure.  I suspect this is related to obesity and venous insufficiency.  Will check a 2D echo.  Family history of heart disease Mother had CAD and brother had bypass surgery at age 63.     Dorn DOROTHA Lesches MD FACP,FACC,FAHA, [facility] 01/27/2024 11:06 AM

## 2024-01-27 NOTE — Assessment & Plan Note (Signed)
 Her most recent lipid profile performed/14/25 revealed total cholesterol 184, LDL 105 and HDL 55 not on statin therapy.  I am going to get a coronary calcium score to her stratify.

## 2024-01-27 NOTE — Assessment & Plan Note (Signed)
 1+ ankle/pretibial edema.  She does not give symptoms of heart failure.  I suspect this is related to obesity and venous insufficiency.  Will check a 2D echo.

## 2024-01-27 NOTE — Assessment & Plan Note (Signed)
 Mother had CAD and brother had bypass surgery at age 67.

## 2024-01-30 ENCOUNTER — Ambulatory Visit (HOSPITAL_COMMUNITY)
Admission: RE | Admit: 2024-01-30 | Discharge: 2024-01-30 | Disposition: A | Discharge status: Home / Self Care | Payer: Self-pay | Source: Ambulatory Visit | Attending: Cardiovascular Disease | Admitting: Cardiovascular Disease

## 2024-01-30 DIAGNOSIS — R918 Other nonspecific abnormal finding of lung field: Secondary | ICD-10-CM | POA: Insufficient documentation

## 2024-01-30 DIAGNOSIS — E782 Mixed hyperlipidemia: Secondary | ICD-10-CM | POA: Insufficient documentation

## 2024-01-30 DIAGNOSIS — I700 Atherosclerosis of aorta: Secondary | ICD-10-CM | POA: Insufficient documentation

## 2024-01-30 DIAGNOSIS — Z8249 Family history of ischemic heart disease and other diseases of the circulatory system: Secondary | ICD-10-CM | POA: Insufficient documentation

## 2024-02-02 ENCOUNTER — Ambulatory Visit: Payer: Self-pay | Admitting: Cardiovascular Disease

## 2024-02-02 ENCOUNTER — Telehealth (HOSPITAL_COMMUNITY): Payer: Self-pay | Admitting: Student in an Organized Health Care Education/Training Program

## 2024-02-02 NOTE — Telephone Encounter (Signed)
 Contacted patient's pharmacy regarding advance refill approval request faxed to our clinic.  Confirmed this was submitted in error.

## 2024-02-03 ENCOUNTER — Ambulatory Visit: Admitting: Psychology

## 2024-02-03 DIAGNOSIS — F411 Generalized anxiety disorder: Secondary | ICD-10-CM

## 2024-02-03 DIAGNOSIS — F331 Major depressive disorder, recurrent, moderate: Secondary | ICD-10-CM | POA: Diagnosis not present

## 2024-02-03 NOTE — Progress Notes (Signed)
 ASLI TOKARSKI is a 68 y.o. female patient   02/03/2024  Treatment Plan: Diagnosis 296.32 (Major depressive affective disorder, recurrent episode, moderate) [n/a]  300.02 (Generalized anxiety disorder) [n/a]  Symptoms Depressed or irritable mood. (Status: maintained) -- No Description Entered  Feelings of hopelessness, worthlessness, or inappropriate guilt. (Status: maintained) -- No Description Entered  Lack of energy. (Status: maintained) -- No Description Entered  Low self-esteem. (Status: maintained) -- No Description Entered  Medication Status compliance  Safety none  If Suicidal or Homicidal State Action Taken: unspecified  Current Risk: low Medications Abilify  (Dosage: .25mg )  Buspar  (Dosage: 30mg )  Citalopram (Dosage: 20mg )  Topomax (Dosage: unknown)  Objectives Related Problem: Recognize, accept, and cope with feelings of depression. Description: Identify and replace thoughts and beliefs that support depression. Target Date: 2024-04-13 Frequency: Daily Modality: individual Progress: 80%  Related Problem: Recognize, accept, and cope with feelings of depression. Description: Learn and implement behavioral strategies to overcome depression. Target Date: 2024-04-13 Frequency: Daily Modality: individual Progress: 75%  Related Problem: Recognize, accept, and cope with feelings of depression. Description: Verbalize an understanding and resolution of current interpersonal problems. Target Date: 2024-04-13 Frequency: Daily Modality: individual Progress: 90%  Related Problem: Recognize, accept, and cope with feelings of depression. Description: Verbalize insight into how past relationships may be influencing current experiences with depression. Target Date: 2024-04-13 Frequency: Daily Modality: individual Progress: 90%  Client Response full compliance  Service Location Location, [address] [place], [place] [address]   Service Code cpt [phone]  Normalize/Reframe  Facilitate problem solving  Identify/label emotions  Validate/empathize  Emotion regulation skills  Self care activities  Lifestyle change (exercise, nutrition)  Self-monitoring  Identified an insight  Rationally challenge thoughts or beliefs/cognitive restructuring  Session notes:   Goals/Plan: Wants to work on being genuine and being satisfied with herself and develop stronger self-esteem. Also, would like to improve her primary relationship and have her interpersonal and romantic needs met. This goal is now met, as she had ended the relationship. Wants to continue self-care and maintain weight loss. Needs to develop strategy to manage relationship with her son [name], who struggles with mental health issues. Goal date 12-25. [name] is now wanting to create a new, fulfilling life and pursue her interest in art. Will also attempt to create a small, but satisfying social network with like-minded people. Goal date is 12-25.   Meds:Lexapro  15mg , Buspar , Abilify  5mg  , Remeron  Patient agrees to video Caregility session and is aware of the limitations of this platform. She is at home and I am at my home office.   [name] decided to take a trip to the beach by herself to get away. She is still in school and says she is keeping up and doing well. She says that she is doing okay. [name] did answer the door for his father and they spent 2 1/2 hours together. Ron is going to continue to support [name] through October. Ron is even prepared to delay retirement in the possibility that he may have to continue support. [name] is both frustrated and relieved. [name] expressed extreme frustration with her weight. She no longer feels that weight loss clinic is helpful and will likely not return. We talked about food as her means to calm moods and that we will need to take a deeper dive into the use of food to manage moods. In meantime she will continue to monitor calories.School  is  going well and work is stable.                                                                                                                                                                   CONI [name] [name], PhD  Time: 1:10p-2:00p 50 minutes.                              CONI [name] [name], PhD

## 2024-02-05 ENCOUNTER — Ambulatory Visit (INDEPENDENT_AMBULATORY_CARE_PROVIDER_SITE_OTHER): Payer: PRIVATE HEALTH INSURANCE | Admitting: Nurse Practitioner

## 2024-02-10 ENCOUNTER — Ambulatory Visit (INDEPENDENT_AMBULATORY_CARE_PROVIDER_SITE_OTHER): Admitting: Psychology

## 2024-02-10 DIAGNOSIS — F411 Generalized anxiety disorder: Secondary | ICD-10-CM

## 2024-02-10 DIAGNOSIS — F331 Major depressive disorder, recurrent, moderate: Secondary | ICD-10-CM | POA: Diagnosis not present

## 2024-02-10 NOTE — Progress Notes (Signed)
 Sharon Cole is a 68 y.o. female patient   02/10/2024  Treatment Plan: Diagnosis 296.32 (Major depressive affective disorder, recurrent episode, moderate) [n/a]  300.02 (Generalized anxiety disorder) [n/a]  Symptoms Depressed or irritable mood. (Status: maintained) -- No Description Entered  Feelings of hopelessness, worthlessness, or inappropriate guilt. (Status: maintained) -- No Description Entered  Lack of energy. (Status: maintained) -- No Description Entered  Low self-esteem. (Status: maintained) -- No Description Entered  Medication Status compliance  Safety none  If Suicidal or Homicidal State Action Taken: unspecified  Current Risk: low Medications Abilify  (Dosage: .25mg )  Buspar  (Dosage: 30mg )  Citalopram (Dosage: 20mg )  Topomax (Dosage: unknown)  Objectives Related Problem: Recognize, accept, and cope with feelings of depression. Description: Identify and replace thoughts and beliefs that support depression. Target Date: 2024-04-13 Frequency: Daily Modality: individual Progress: 80%  Related Problem: Recognize, accept, and cope with feelings of depression. Description: Learn and implement behavioral strategies to overcome depression. Target Date: 2024-04-13 Frequency: Daily Modality: individual Progress: 75%  Related Problem: Recognize, accept, and cope with feelings of depression. Description: Verbalize an understanding and resolution of current interpersonal problems. Target Date: 2024-04-13 Frequency: Daily Modality: individual Progress: 90%  Related Problem: Recognize, accept, and cope with feelings of depression. Description: Verbalize insight into how past relationships may be influencing current experiences with depression. Target Date: 2024-04-13 Frequency: Daily Modality: individual Progress: 90%  Client Response full compliance  Service Location Location, [address] [place], [place] [address]  Service Code cpt [phone]  Normalize/Reframe  Facilitate problem solving  Identify/label emotions  Validate/empathize  Emotion regulation skills  Self care activities  Lifestyle change (exercise, nutrition)  Self-monitoring  Identified an insight  Rationally challenge thoughts or beliefs/cognitive restructuring  Session notes:   Goals/Plan: Wants to work on being genuine and being satisfied with herself and develop stronger self-esteem. Also, would like to improve her primary relationship and have her interpersonal and romantic needs met. This goal is now met, as she had ended the relationship. Wants to continue self-care and maintain weight loss. Needs to develop strategy to manage relationship with her son Sharon Cole, who struggles with mental health issues. Goal date 12-25. 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 Will also attempt to create a small, but satisfying social network with like-minded people. Goal date is 12-25.   Meds:Lexapro  15mg , Buspar , Abilify  5mg  , Remeron  Patient agrees to video Caregility session and is aware of the limitations of this platform. She is at home and I am at my home office.   Sharon Cole says that Sharon Cole denied having an issue with her. She is at a loss to understand his lack of communication with her. School is going well and she is keeping up. 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 She says that her placement has not yet been approved by the school. She does feel she has covered all her bases and will able to in late November. Sharon Cole is very distressed that she is not losing weight. She is not making good choices. She plans to simply go on a low carb regimen.  Sharon Sharon Cole [name], PhD  Time: 4:15p-5:00p 45  minutes.

## 2024-02-16 ENCOUNTER — Ambulatory Visit: Admitting: Psychology

## 2024-02-17 ENCOUNTER — Ambulatory Visit: Admitting: Psychology

## 2024-02-18 ENCOUNTER — Ambulatory Visit: Admitting: Family Medicine

## 2024-02-19 ENCOUNTER — Ambulatory Visit (INDEPENDENT_AMBULATORY_CARE_PROVIDER_SITE_OTHER): Admitting: Nurse Practitioner

## 2024-02-24 ENCOUNTER — Ambulatory Visit: Admitting: Psychology

## 2024-02-24 DIAGNOSIS — F411 Generalized anxiety disorder: Secondary | ICD-10-CM | POA: Diagnosis not present

## 2024-02-24 DIAGNOSIS — F331 Major depressive disorder, recurrent, moderate: Secondary | ICD-10-CM | POA: Diagnosis not present

## 2024-02-24 NOTE — Progress Notes (Signed)
 Sharon Cole is a 68 y.o. female patient   02/24/2024  Treatment Plan: Diagnosis 296.32 (Major depressive affective disorder, recurrent episode, moderate) [n/a]  300.02 (Generalized anxiety disorder) [n/a]  Symptoms Depressed or irritable mood. (Status: maintained) -- No Description Entered  Feelings of hopelessness, worthlessness, or inappropriate guilt. (Status: maintained) -- No Description Entered  Lack of energy. (Status: maintained) -- No Description Entered  Low self-esteem. (Status: maintained) -- No Description Entered  Medication Status compliance  Safety none  If Suicidal or Homicidal State Action Taken: unspecified  Current Risk: low Medications Abilify  (Dosage: .25mg )  Buspar  (Dosage: 30mg )  Citalopram (Dosage: 20mg )  Topomax (Dosage: unknown)  Objectives Related Problem: Recognize, accept, and cope with feelings of depression. Description: Identify and replace thoughts and beliefs that support depression. Target Date: 2024-04-13 Frequency: Daily Modality: individual Progress: 80%  Related Problem: Recognize, accept, and cope with feelings of depression. Description: Learn and implement behavioral strategies to overcome depression. Target Date: 2024-04-13 Frequency: Daily Modality: individual Progress: 75%  Related Problem: Recognize, accept, and cope with feelings of depression. Description: Verbalize an understanding and resolution of current interpersonal problems. Target Date: 2024-04-13 Frequency: Daily Modality: individual Progress: 90%  Related Problem: Recognize, accept, and cope with feelings of depression. Description: Verbalize insight into how past relationships may be influencing current experiences with depression. Target Date: 2024-04-13 Frequency: Daily Modality: individual Progress: 90%  Client Response full compliance  Service  Location Location, [address] [place], [place] [address]  Service Code cpt [phone]  Normalize/Reframe  Facilitate problem solving  Identify/label emotions  Validate/empathize  Emotion regulation skills  Self care activities  Lifestyle change (exercise, nutrition)  Self-monitoring  Identified an insight  Rationally challenge thoughts or beliefs/cognitive restructuring  Session notes:   Goals/Plan: Wants to work on being genuine and being satisfied with herself and develop stronger self-esteem. Also, would like to improve her primary relationship and have her interpersonal and romantic needs met. This goal is now met, as she had ended the relationship. Wants to continue self-care and maintain weight loss. Needs to develop strategy to manage relationship with her son Sharon Cole, who struggles with mental health issues. Goal date 12-25. 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 Will also attempt to create a small, but satisfying social network with like-minded people. Goal date is 12-25.   Meds:Lexapro  15mg , Buspar , Abilify  5mg  , Remeron  Patient agrees to video Caregility session and is aware of the limitations of this platform. She is at home and I am at my home office.   Sharon Cole says that she has had 4 days off in a row and using it to go through information for a policy class. Talked with her about her placement options and made recommendations that she get as much relevant clinical experience as possible. Says her weight is her greatest challenge right now. Says there is no diet she can adhere to on an ongoing basis. Discussed the day at a time strategy and need to eat earlier in day to avoid eveng binge. She still uses food to manage moods and we talked about alternative strategies.  CONI Sharon Cole [name], PhD  Time: 4:15p-5:00p 45 minutes.

## 2024-03-02 ENCOUNTER — Ambulatory Visit (INDEPENDENT_AMBULATORY_CARE_PROVIDER_SITE_OTHER): Admitting: Psychology

## 2024-03-02 ENCOUNTER — Ambulatory Visit (HOSPITAL_COMMUNITY)
Admission: RE | Admit: 2024-03-02 | Discharge: 2024-03-02 | Disposition: A | Discharge status: Home / Self Care | Source: Ambulatory Visit | Attending: Cardiology | Admitting: Cardiology

## 2024-03-02 DIAGNOSIS — Z8249 Family history of ischemic heart disease and other diseases of the circulatory system: Secondary | ICD-10-CM | POA: Diagnosis present

## 2024-03-02 DIAGNOSIS — R600 Localized edema: Secondary | ICD-10-CM | POA: Insufficient documentation

## 2024-03-02 DIAGNOSIS — E782 Mixed hyperlipidemia: Secondary | ICD-10-CM | POA: Insufficient documentation

## 2024-03-02 DIAGNOSIS — F331 Major depressive disorder, recurrent, moderate: Secondary | ICD-10-CM

## 2024-03-02 DIAGNOSIS — F411 Generalized anxiety disorder: Secondary | ICD-10-CM | POA: Diagnosis not present

## 2024-03-02 LAB — ECHOCARDIOGRAM COMPLETE
Area-P 1/2: 3.74 cm2
S' Lateral: 2.6 cm

## 2024-03-02 NOTE — Progress Notes (Signed)
 Sharon Cole is a 68 y.o. female patient   03/02/2024  Treatment Plan: Diagnosis 296.32 (Major depressive affective disorder, recurrent episode, moderate) [n/a]  300.02 (Generalized anxiety disorder) [n/a]  Symptoms Depressed or irritable mood. (Status: maintained) -- No Description Entered  Feelings of hopelessness, worthlessness, or inappropriate guilt. (Status: maintained) -- No Description Entered  Lack of energy. (Status: maintained) -- No Description Entered  Low self-esteem. (Status: maintained) -- No Description Entered  Medication Status compliance  Safety none  If Suicidal or Homicidal State Action Taken: unspecified  Current Risk: low Medications Abilify  (Dosage: .25mg )  Buspar  (Dosage: 30mg )  Citalopram (Dosage: 20mg )  Topomax (Dosage: unknown)  Objectives Related Problem: Recognize, accept, and cope with feelings of depression. Description: Identify and replace thoughts and beliefs that support depression. Target Date: 2024-04-13 Frequency: Daily Modality: individual Progress: 80%  Related Problem: Recognize, accept, and cope with feelings of depression. Description: Learn and implement behavioral strategies to overcome depression. Target Date: 2024-04-13 Frequency: Daily Modality: individual Progress: 75%  Related Problem: Recognize, accept, and cope with feelings of depression. Description: Verbalize an understanding and resolution of current interpersonal problems. Target Date: 2024-04-13 Frequency: Daily Modality: individual Progress: 90%  Related Problem: Recognize, accept, and cope with feelings of depression. Description: Verbalize insight into how past relationships may be influencing current experiences with depression. Target Date: 2024-04-13 Frequency: Daily Modality: individual Progress: 90%  Client Response full compliance  Service  Location Location, [address] [place], [place] [address]  Service Code cpt [phone]  Normalize/Reframe  Facilitate problem solving  Identify/label emotions  Validate/empathize  Emotion regulation skills  Self care activities  Lifestyle change (exercise, nutrition)  Self-monitoring  Identified an insight  Rationally challenge thoughts or beliefs/cognitive restructuring  Session notes:   Goals/Plan: Wants to work on being genuine and being satisfied with herself and develop stronger self-esteem. Also, would like to improve her primary relationship and have her interpersonal and romantic needs met. This goal is now met, as she had ended the relationship. Wants to continue self-care and maintain weight loss. Needs to develop strategy to manage relationship with her son Sharon Cole, who struggles with mental health issues. Goal date 12-25. [name] is now wanting to create a new, fulfilling life and pursue her interest in art. Will also attempt to create a small, but satisfying social network with like-minded people. Goal date is 12-25.   Meds:Lexapro  15mg , Buspar , Abilify  5mg  , Remeron  Patient agrees to video Caregility session and is aware of the limitations of this platform. She is at home and I am at my home office.   [name] her placement hasn't yet been fully approved, but will handle it today. She is finding work very frustrating, but hoping things will change. She is optimistic about weight because she is down a couple of pounds. She has not heard from Ron or [name], so has no clue as to the status of Dave's living situation. She feels she will hear from Ron before end of month. She is mostly just doing school and work and relaxing. She is using weed again, but not as much. She feels life is same old but isn't really feeling motivated to change that up.  CONI Sharon Cole [name], PhD  Time: 1:15p-2:00p 45 minutes.                                             CONI Sharon Cole [name], PhD

## 2024-03-08 ENCOUNTER — Ambulatory Visit: Payer: Self-pay

## 2024-03-08 ENCOUNTER — Ambulatory Visit (HOSPITAL_COMMUNITY): Admitting: Student in an Organized Health Care Education/Training Program

## 2024-03-08 NOTE — Telephone Encounter (Signed)
 FYI Only or Action Required?: FYI only for provider: appointment scheduled on 03/19/24.  Patient was last seen in primary care on 01/21/2024 by Jude Lonell BRAVO, NP.  Called Nurse Triage reporting Joint Swelling.  Symptoms began several months ago.  Interventions attempted: Rest, hydration, or home remedies.  Symptoms are: unchanged.  Triage Disposition: See PCP Within 2 Weeks  Patient/caregiver understands and will follow disposition?: Yes    Copied from CRM [id number]. Topic: Clinical - Red Word Triage >> Mar 08, 2024 12:02 PM China J wrote: [facility] that prompted transfer to Nurse Triage: Ankle swelling and elevated lab levels. Patient needs an appointment per cardiology. Reason for Disposition  [1] MILD swelling of both ankles (i.e., pedal edema) AND [2] is a chronic symptom (recurrent or ongoing AND present > 4 weeks)  Answer Assessment - Initial Assessment Questions 1. LOCATION: Which ankle is swollen? Where is the swelling?     bilateral 2. ONSET: When did the swelling start?     Ongoing for few months-already evaluated by pcp. Cardiologist recommended pcp follow up for ankle swelling and elevated LDL lab.  3. SWELLING: How bad is the swelling? Or, How large is it? (e.g., mild, moderate, severe; size of localized swelling)      Mild-not worsening  4. PAIN: Is there any pain? If Yes, ask: How bad is it? (Scale 0-10; or none, mild, moderate, severe)     denies 5. CAUSE: What do you think caused the ankle swelling?     unsure 6. OTHER SYMPTOMS: Do you have any other symptoms? (e.g., fever, chest pain, difficulty breathing, calf pain)     denies 7. PREGNANCY: Is there any chance you are pregnant? When was your last menstrual period?  Protocols used: Ankle Swelling-A-AH

## 2024-03-15 ENCOUNTER — Encounter (HOSPITAL_COMMUNITY): Payer: Self-pay | Admitting: Student in an Organized Health Care Education/Training Program

## 2024-03-15 ENCOUNTER — Ambulatory Visit (HOSPITAL_BASED_OUTPATIENT_CLINIC_OR_DEPARTMENT_OTHER): Admitting: Student in an Organized Health Care Education/Training Program

## 2024-03-15 VITALS — BP 103/71 | HR 65 | Ht 64.5 in | Wt 232.6 lb

## 2024-03-15 DIAGNOSIS — F3342 Major depressive disorder, recurrent, in full remission: Secondary | ICD-10-CM

## 2024-03-15 DIAGNOSIS — F411 Generalized anxiety disorder: Secondary | ICD-10-CM

## 2024-03-15 MED ORDER — ESCITALOPRAM OXALATE 5 MG PO TABS: 15.0000 mg | ORAL_TABLET | Freq: Every day | ORAL | 0 refills | Status: DC

## 2024-03-15 MED ORDER — BUSPIRONE HCL 30 MG PO TABS: 30.0000 mg | ORAL_TABLET | Freq: Two times a day (BID) | ORAL | 0 refills | Status: DC

## 2024-03-15 MED ORDER — GABAPENTIN 100 MG PO CAPS: 100.0000 mg | ORAL_CAPSULE | Freq: Every day | ORAL | 0 refills | Status: DC | PRN

## 2024-03-15 MED ORDER — ARIPIPRAZOLE 5 MG PO TABS: 5.0000 mg | ORAL_TABLET | Freq: Every day | ORAL | 0 refills | Status: DC

## 2024-03-15 NOTE — Progress Notes (Signed)
 [facility] MD Outpatient Progress Note  03/15/2024 1:12 PM Sharon Cole  MRN:  [id number]  Assessment:  Sharon Cole presents for follow-up evaluation on 03/15/24 .  The patient's mood symptoms are stable on her current psychotropic regimen and are appropriate to continue at her current doses.  At the patient's request, propranolol  has been discontinued as she reports no recent use of this medication.  She has resumed cannabis use, which appears to be mild-moderate and the potential impact of cannabis on mood stability and overall mental health was discussed.  No acute safety concerns at this time   Identifying Information: Sharon Cole is a 68 y.o. female with a history of GAD, cannabis use disorder, MDD who is an established patient with [facility] for management of anxiety and depression. For a comprehensive history and detailed assessment, please refer to the initial adult assessment. She is a former patient of Dr. Marry.  The patient's PMHx is significant for obesity, HLD, and prediabetes.   Plan:  # Generalized anxiety disorder  Hx major depressive disorder, moderate Interventions: -- Continue Lexapro  15 mg daily - Continue Abilify  5 mg nightly for augmentation of antidepressant medications - Continue buspirone  30 mg BID -- Continue gabapentin  100 mg daily PRN - Discontinue propranolol   - Continue therapy with Dr. Gutterman   # Cannabis use disorder Interventions: -- Active, resumed since last visit -- Cessation/abstinence encouraged   #Long term use of antipsychotic medication -- Repeat lipid panel, A1c, and EKG due April 2026   Patient was given contact information for behavioral health clinic and was instructed to call 911 for emergencies.   Subjective:  Chief Complaint:  Chief Complaint  Patient presents with   Follow-up    Interval History:  Patient reports she has been doing well since the last visit, with both depressive  and anxiety symptoms stable and unchanged from prior. She reports vaping THC after completing schoolwork or work, noting that a vape pen typically lasts her 3 to 4 weeks and that she uses it to feel calm and relaxed. She describes her sleep as good, averaging 8 hours per night and feeling rested. Appetite is good but increased, with her diet primarily consisting of carbohydrates and frequent sweets, estimating a daily intake of 1100 to 1200 calories. She denies current stressors. She reports no suicidal or homicidal ideation, and no auditory or visual hallucinations. She requests discontinuation of propranolol , stating she has not used it in the past couple of months, and reports she tolerated tapering off Remeron  without issue. She states therapy is going well and notes her next session is scheduled in six weeks.  Visit Diagnosis:    ICD-10-CM   1. 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   Occupation: consulting civil engineer  Tobacco Use   Smoking status: Never   Smokeless tobacco: Never  Vaping Use   Vaping status: Never Used  Substance and Sexual Activity   Alcohol use: Yes  Alcohol/week: 1.0 standard drink of alcohol    Types: 1 Standard drinks or equivalent per week   Drug use: No   Sexual activity: Not Currently    Partners: Male  Other Topics Concern   Not on file  Social History Narrative   Lives alone/2025   Caffeine use:    Drinks 16oz caffeine drinks a day    Social Drivers  of Corporate Investment Banker Strain: Low Risk  (02/17/2024)   Overall Financial Resource Strain (CARDIA)    Difficulty of Paying Living Expenses: Not very hard  Food Insecurity: No Food Insecurity (02/17/2024)   Hunger Vital Sign    Worried About Running Out of Food in the Last Year: Never true    Ran Out of Food in the Last Year: Never true  Transportation Needs: No Transportation Needs (02/17/2024)   PRAPARE - Administrator, Civil Service (Medical): No    Lack of Transportation (Non-Medical): No  Physical Activity: Inactive (02/17/2024)   Exercise Vital Sign    Days of Exercise per Week: 0 days    Minutes of Exercise per Session: Not on file  Stress: No Stress Concern Present (02/17/2024)   [employer] of Occupational Health - Occupational Stress Questionnaire    Feeling of Stress: Not at all  Social Connections: Socially Isolated (02/17/2024)   Social Connection and Isolation Panel    Frequency of Communication with Friends and Family: Twice a week    Frequency of Social Gatherings with Friends and Family: Once a week    Attends Religious Services: Never    Database Administrator or Organizations: No    Attends Engineer, Structural: Not on file    Marital Status: Divorced    Allergies: No Known Allergies  Current Medications: Current Outpatient Medications  Medication Sig Dispense Refill   Apoaequorin (PREVAGEN EXTRA STRENGTH PO) Take by mouth.     ARIPiprazole  (ABILIFY ) 5 MG tablet Take 1 tablet (5 mg total) by mouth daily. 30 tablet 1   busPIRone  (BUSPAR ) 30 MG tablet Take 1 tablet (30 mg total) by mouth 2 (two) times daily. 60 tablet 2   escitalopram  (LEXAPRO ) 5 MG tablet Take 3 tablets (15 mg total) by mouth daily. 180 tablet 3   gabapentin  (NEURONTIN ) 100 MG capsule Take 1 capsule (100 mg total) by mouth daily as needed. 30 capsule 2   Multiple Vitamins-Minerals (MULTIVITAMIN PO) Take 1 tablet by mouth daily.     Omeprazole  Magnesium  (PRILOSEC PO)      propranolol  (INDERAL ) 10 MG tablet Take 10 mg by mouth 2 (two) times daily.     valACYclovir  (VALTREX ) 500 MG tablet Take 500 mg by mouth 2 (two) times daily.     No current facility-administered medications for this visit.    ROS: Review of Systems  All other systems reviewed and are negative.   Objective:  Objective: Psychiatric Specialty Exam: General Appearance: Casual, fairly groomed  Eye Contact:  Good    Speech:  Clear, coherent, normal rate, spontaneous  Volume:  Normal   Mood:  see above  Affect:  congruent, full range  Thought Content: Logical   Suicidal Thoughts: see subjective  Thought Process:  Coherent, goal-directed  Orientation:  A&Ox4   Memory:  Immediate good  Judgment:  Good  Insight:  Fair  Concentration:  Attention and concentration good   Recall:  Good  Fund of Knowledge: Good  Language: Good, fluent  Psychomotor Activity: Normal  Akathisia:  NA  AIMS (if indicated): NA   Assets:   Communication Skills Desire for Improvement Resilience  ADL's:  Intact  Cognition: WNL  Sleep: see above  Appetite: see above    Physical Exam Constitutional:      General: She is not in acute distress.    Appearance: She is not ill-appearing.  HENT:     Head: Normocephalic and atraumatic.  Eyes:     Extraocular Movements: Extraocular movements intact.     Conjunctiva/sclera: Conjunctivae normal.  Pulmonary:     Effort: Pulmonary effort is normal. No respiratory distress.  Neurological:     General: No focal deficit present.     Mental Status: She is alert and oriented to person, place, and time.      Metabolic Disorder Labs: Lab Results  Component Value Date   HGBA1C 5.7 08/19/2023   No results found for: PROLACTIN Lab Results  Component Value Date   CHOL 184 08/19/2023   TRIG 118.0 08/19/2023   HDL 55.80 08/19/2023   CHOLHDL 3 08/19/2023   VLDL 23.6 08/19/2023   LDLCALC 105 (H) 08/19/2023   LDLCALC 125 (H) 03/25/2023    Lab Results  Component Value Date   TSH 2.30 08/19/2023   TSH 1.760 02/05/2022    Therapeutic Level Labs: No results found for: LITHIUM No results found for: VALPROATE No results found for: CBMZ  Screenings:  GAD-7    Flowsheet Row [facility]/20[facility]  Total GAD-7 Score 6   PHQ2-9    Flowsheet Row Office Visit from 12/15/2023 in [facility] Visit from 11[facility]24/2023 in [facility] Clinical Support from 11/21/2023 in [facility] [place] Office Visit from 10/06/2023 in [facility] Office Visit from 08/19/2023 in [facility] [place]  PHQ-2 Total Score 0 0 3 0 0  PHQ-9 Total Score 0 1 3 -- 0    Collaboration of Care:   Patient/Guardian was advised Release of Information must be obtained prior to any record release in order to collaborate their care with an outside provider. Patient/Guardian was advised if they have not already done so to contact the registration department to sign all necessary forms in order for us  to release information regarding their care.   Consent: Patient/Guardian gives verbal consent for treatment and assignment of benefits for services provided during this visit. Patient/Guardian expressed understanding and agreed to proceed.    Marlo Masson, MD 03/15/2024, 1:12 PM

## 2024-03-19 ENCOUNTER — Ambulatory Visit: Admitting: Family Medicine

## 2024-03-19 ENCOUNTER — Encounter: Payer: Self-pay | Admitting: Family Medicine

## 2024-03-19 VITALS — BP 124/80 | HR 70 | Temp 97.6°F | Ht 64.5 in | Wt 227.0 lb

## 2024-03-19 DIAGNOSIS — I700 Atherosclerosis of aorta: Secondary | ICD-10-CM | POA: Diagnosis not present

## 2024-03-19 DIAGNOSIS — Z23 Encounter for immunization: Secondary | ICD-10-CM

## 2024-03-19 DIAGNOSIS — E7800 Pure hypercholesterolemia, unspecified: Secondary | ICD-10-CM | POA: Diagnosis not present

## 2024-03-19 DIAGNOSIS — R600 Localized edema: Secondary | ICD-10-CM

## 2024-03-19 DIAGNOSIS — R7303 Prediabetes: Secondary | ICD-10-CM | POA: Diagnosis not present

## 2024-03-19 MED ORDER — ATORVASTATIN CALCIUM 20 MG PO TABS: 20.0000 mg | ORAL_TABLET | Freq: Every day | ORAL | 3 refills | Status: AC

## 2024-03-19 NOTE — Progress Notes (Signed)
 Subjective:     Patient ID: Sharon Cole, female    DOB: 1956-04-24, 68 y.o.   MRN: [id number]  Chief Complaint  Patient presents with   Medical Management of Chronic Issues    Left ankle swelling for several months    HPI  Discussed the use of AI scribe software for clinical note transcription with the patient, who gave verbal consent to proceed.  History of Present Illness Sharon Cole is a 68 year old female who presents with concerns regarding ankle edema and to follow up on chronic health conditions.  Peripheral edema - Swelling in the left ankle, more pronounced than the right - Edema is more noticeable during the summer months - No associated pain or tenderness - No history of lower extremity deep vein thrombosis - Compression socks provide temporary relief - Edema predates bunion surgery in June - No prior ultrasound of the affected leg  Cardiovascular evaluation - Echocardiogram performed - CT coronary calcium score reveals minimal aortic atherosclerosis - Family history of cardiovascular disease     Health Maintenance Due  Topic Date Due   Hepatitis C Screening  Never done   Zoster Vaccines- Shingrix (1 of 2) Never done    Past Medical History:  Diagnosis Date   Anxiety    Back pain    Bradycardia    Common migraine with intractable migraine 07/03/2016   Depression    Dizziness    Edema of both lower extremities    Flatulence, eructation and gas pain 07/08/2019   Gallbladder problem    Headache    History of colonic polyps 07/08/2019   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   Hyperlipidemia 08/18/2023   Menopause    Prediabetes 08/18/2023   Syncope     Past Surgical History:  Procedure Laterality Date   BACK SURGERY     cyst removal    BREAST SURGERY     breast reduction   BUNIONECTOMY Left 10/28/2023   CHOLECYSTECTOMY     KNEE ARTHROSCOPY     SMALL INTESTINE SURGERY     SPINE SURGERY      Family  History  Problem Relation Age of Onset   Heart disease Mother    Depression Mother    Anxiety disorder Mother    Obesity Mother    Heart Problems Father    Heart disease Brother    Heart disease Maternal Grandmother    Heart disease Maternal Grandfather    Cancer Son        unknown   ADD / ADHD Son    Depression Son    ADD / ADHD Son    Cancer Son     Social History   Socioeconomic History   Marital status: Significant Other    Spouse name: Not on file   Number of children: 2   Years of education: Masters   Highest education level: Master's degree (e.g., MA, MS, MEng, MEd, MSW, MBA)  Occupational History   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   Occupation: consulting civil engineer  Tobacco Use   Smoking status: Never   Smokeless tobacco: Never  Vaping Use   Vaping status: Never Used  Substance and Sexual Activity   Alcohol use: Yes    Alcohol/week: 1.0 standard drink of alcohol    Types: 1 Standard drinks or equivalent per week   Drug use: No   Sexual activity: Not Currently    Partners: Male  Other Topics Concern   Not on file  Social History Narrative   Lives alone/2025   Caffeine use:    Drinks 16oz caffeine drinks a day    Social Drivers of Corporate Investment Banker Strain: Low Risk  (02/17/2024)   Overall Financial Resource Strain (CARDIA)    Difficulty of Paying Living Expenses: Not very hard  Food Insecurity: No Food Insecurity (02/17/2024)   Hunger Vital Sign    Worried About Running Out of Food in the Last Year: Never true    Ran Out of Food in the Last Year: Never true  Transportation Needs: No Transportation Needs (02/17/2024)   PRAPARE - Administrator, Civil Service (Medical): No    Lack of Transportation (Non-Medical): No  Physical Activity: Inactive (02/17/2024)   Exercise Vital Sign    Days of Exercise per Week: 0 days    Minutes of Exercise per Session: Not on file  Stress: No Stress Concern Present (02/17/2024)   [employer] - Occupational Stress Questionnaire    Feeling of Stress: Not at all  Social Connections: Socially Isolated (02/17/2024)   Social Connection and Isolation Panel    Frequency of Communication with Friends and Family: Twice a week    Frequency of Social Gatherings with Friends and Family: Once a week    Attends Religious Services: Never    Database Administrator or Organizations: No    Attends Engineer, Structural: Not on file    Marital Status: Divorced  Intimate Partner Violence: Patient Unable To Answer (11/21/2023)   Humiliation, Afraid, Rape, and Kick questionnaire    Fear of Current or Ex-Partner: Patient unable to answer    Emotionally Abused: Patient unable to answer    Physically Abused: Patient unable to answer    Sexually Abused: Patient unable to answer    Outpatient Medications Prior to Visit  Medication Sig Dispense Refill   Apoaequorin (PREVAGEN EXTRA STRENGTH PO) Take by mouth.     ARIPiprazole  (ABILIFY ) 5 MG tablet Take 1 tablet (5 mg total) by mouth daily. 90 tablet 0   busPIRone  (BUSPAR ) 30 MG tablet Take 1 tablet (30 mg total) by mouth 2 (two) times daily. 180 tablet 0   escitalopram  (LEXAPRO ) 5 MG tablet Take 3 tablets (15 mg total) by mouth daily. 270 tablet 0   gabapentin  (NEURONTIN ) 100 MG capsule Take 1 capsule (100 mg total) by mouth daily as needed. 90 capsule 0   Multiple Vitamins-Minerals (MULTIVITAMIN PO) Take 1 tablet by mouth daily.     Omeprazole  Magnesium (PRILOSEC PO)      valACYclovir  (VALTREX ) 500 MG tablet Take 500 mg by mouth 2 (two) times daily.     No facility-administered medications prior to visit.    No Known Allergies  Review of Systems  Constitutional:  Negative for chills and fever.  Respiratory:  Negative for shortness of breath.   Cardiovascular:  Positive for leg swelling. Negative for chest pain and palpitations.       L>R  Gastrointestinal:  Negative for abdominal pain, constipation,  diarrhea, nausea and vomiting.  Musculoskeletal:  Negative for joint pain and myalgias.  Neurological:  Negative for dizziness, focal weakness and headaches.       Objective:    Physical Exam Constitutional:      General: She is not in acute distress.    Appearance: She is not ill-appearing.  Eyes:     Extraocular Movements: Extraocular movements intact.     Conjunctiva/sclera: Conjunctivae normal.  Cardiovascular:  Rate and Rhythm: Normal rate and regular rhythm.  Pulmonary:     Effort: Pulmonary effort is normal.     Breath sounds: Normal breath sounds.  Musculoskeletal:     Cervical back: Normal range of motion and neck supple.     Left ankle: Swelling present. No tenderness. Normal range of motion. Normal pulse.     Comments: Left ankle and lower ext with non pitting edema slightly greater than right. Non tender calf. No obvious sign of obstruction or DVT. No infection   Skin:    General: Skin is warm and dry.  Neurological:     General: No focal deficit present.     Mental Status: She is alert and oriented to person, place, and time.     Motor: No weakness.     Coordination: Coordination normal.     Gait: Gait normal.  Psychiatric:        Mood and Affect: Mood normal.        Behavior: Behavior normal.        Thought Content: Thought content normal.      BP 124/80   Pulse 70   Temp 97.6 F (36.4 C) (Temporal)   Ht 5' 4.5 (1.638 m)   Wt 227 lb (103 kg)   SpO2 98%   BMI 38.36 kg/m  Wt Readings from Last 3 Encounters:  03/19/24 227 lb (103 kg)  01/27/24 228 lb (103.4 kg)  01/21/24 220 lb (99.8 kg)       Assessment & Plan:   Problem List Items Addressed This Visit     Hyperlipidemia - Primary   Relevant Medications   atorvastatin (LIPITOR) 20 MG tablet   Other Relevant Orders   Lipid panel   Comprehensive metabolic panel with GFR   Prediabetes   Relevant Orders   Hemoglobin A1c   Comprehensive metabolic panel with GFR   Other Visit Diagnoses        Need for influenza vaccination       Relevant Orders   Flu vaccine HIGH DOSE PF(Fluzone Trivalent) (Completed)     Edema of left lower leg       Relevant Orders   VAS US  LOWER EXTREMITY VENOUS (DVT)     Aortic atherosclerosis       Relevant Medications   atorvastatin (LIPITOR) 20 MG tablet     Need for pneumococcal 20-valent conjugate vaccination       Relevant Orders   Pneumococcal conjugate vaccine 20-valent (Prevnar 20) (Completed)       Assessment and Plan Assessment & Plan Left ankle edema Chronic left ankle edema for a few months, more pronounced than the right. No tenderness or history of blood clots. Unlikely DVT but will need to rule out. Echocardiogram ruled out heart failure.  - Ordered ultrasound of the left leg to assess for blood clots or venous insufficiency.  Hyperlipidemia Minimal aortic atherosclerosis on CT coronary calcium score. Cardiologist recommended starting atorvastatin 20 mg. Discussed potential side effects.   - Start atorvastatin 20 mg daily. - Will recheck fasting lipids in six weeks to assess efficacy and monitor liver function.  Immunizations (influenza and pneumococcal) Due for influenza and pneumococcal vaccinations. Shingles vaccine to be obtained at pharmacy. - Administered influenza vaccine. - Administered pneumococcal vaccine. - Advised to obtain shingles vaccine at pharmacy.  Prediabetes - recheck A1c at follow up lab visit in 6 wks   Aortic atherosclerosis Minimal aortic atherosclerosis noted on CT coronary calcium score. - start statin therapy  I am having Adrien CHARLENA Pun [name] start on atorvastatin. I am also having her maintain her Multiple Vitamins-Minerals (MULTIVITAMIN PO), Apoaequorin (PREVAGEN EXTRA STRENGTH PO), Omeprazole  Magnesium (PRILOSEC PO), valACYclovir , ARIPiprazole , busPIRone , escitalopram , and gabapentin .  Meds ordered this encounter  Medications   atorvastatin (LIPITOR) 20 MG tablet    Sig: Take  1 tablet (20 mg total) by mouth daily.    Dispense:  90 tablet    Refill:  3    Supervising Provider:   ROLLENE NORRIS A [[id number]]

## 2024-03-19 NOTE — Patient Instructions (Addendum)
 Please start on atorvastatin for your cholesterol.   Follow up for fasting labs in 6 weeks.   You will receive a call to have the vascular ultrasound of your left leg done.

## 2024-03-23 ENCOUNTER — Ambulatory Visit (HOSPITAL_COMMUNITY)
Admission: RE | Admit: 2024-03-23 | Discharge: 2024-03-23 | Disposition: A | Discharge status: Home / Self Care | Source: Ambulatory Visit | Attending: Family Medicine | Admitting: Family Medicine

## 2024-03-23 DIAGNOSIS — R600 Localized edema: Secondary | ICD-10-CM | POA: Insufficient documentation

## 2024-03-24 ENCOUNTER — Ambulatory Visit: Payer: Self-pay | Admitting: Family Medicine

## 2024-03-30 ENCOUNTER — Ambulatory Visit: Admitting: Psychology

## 2024-04-06 ENCOUNTER — Ambulatory Visit: Admitting: Psychology

## 2024-04-13 ENCOUNTER — Ambulatory Visit: Admitting: Psychology

## 2024-04-13 DIAGNOSIS — F411 Generalized anxiety disorder: Secondary | ICD-10-CM

## 2024-04-13 NOTE — Progress Notes (Signed)
 Sharon Cole is a 68 y.o. female patient   04/13/2024  Treatment Plan: Diagnosis 296.32 (Major depressive affective disorder, recurrent episode, moderate) [n/a]  300.02 (Generalized anxiety disorder) [n/a]  Symptoms Depressed or irritable mood. (Status: maintained) -- No Description Entered  Feelings of hopelessness, worthlessness, or inappropriate guilt. (Status: maintained) -- No Description Entered  Lack of energy. (Status: maintained) -- No Description Entered  Low self-esteem. (Status: maintained) -- No Description Entered  Medication Status compliance  Safety none  If Suicidal or Homicidal State Action Taken: unspecified  Current Risk: low Medications Abilify  (Dosage: .25mg )  Buspar  (Dosage: 30mg )  Citalopram (Dosage: 20mg )  Topomax (Dosage: unknown)  Objectives Related Problem: Recognize, accept, and cope with feelings of depression. Description: Identify and replace thoughts and beliefs that support depression. Target Date: 2024-04-13 Frequency: Daily Modality: individual Progress: 80%  Related Problem: Recognize, accept, and cope with feelings of depression. Description: Learn and implement behavioral strategies to overcome depression. Target Date: 2024-04-13 Frequency: Daily Modality: individual Progress: 75%  Related Problem: Recognize, accept, and cope with feelings of depression. Description: Verbalize an understanding and resolution of current interpersonal problems. Target Date: 2024-04-13 Frequency: Daily Modality: individual Progress: 90%  Related Problem: Recognize, accept, and cope with feelings of depression. Description: Verbalize insight into how past relationships may be influencing current experiences with depression. Target Date: 2024-04-13 Frequency: Daily Modality: individual Progress: 90%  Client Response full  compliance  Service Location Location, [address] [place], [place] [address]  Service Code cpt [phone]  Normalize/Reframe  Facilitate problem solving  Identify/label emotions  Validate/empathize  Emotion regulation skills  Self care activities  Lifestyle change (exercise, nutrition)  Self-monitoring  Identified an insight  Rationally challenge thoughts or beliefs/cognitive restructuring  Session notes:   Goals/Plan: Wants to work on being genuine and being satisfied with herself and develop stronger self-esteem. Also, would like to improve her primary relationship and have her interpersonal and romantic needs met. This goal is now met, as she had ended the relationship. Wants to continue self-care and maintain weight loss. Needs to develop strategy to manage relationship with her son Sharon Cole, who struggles with mental health issues. Goal date 12-25. 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 Will also attempt to create a small, but satisfying social network with like-minded people. Goal date is 12-26.   Meds:Lexapro  15mg , Buspar , Abilify  5mg  , Remeron  Patient agrees to video Caregility session and is aware of the limitations of this platform. She is at home and I am at my home office.   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 Does not look like this is going to be a good experience. Suggested that she consult with other intern. Work is about the same and is frusutrating. She says Sharon Cole is in a good place considering. He showed up at Thanksgiving at [place] house.  She is thinking that things with Sharon Cole have been more stable and he is not at risk of losing housing.  CONI Sharon Cole [name], PhD  Time: 1:15p-2:00p 45  minutes.                                             CONI Sharon Cole [name], PhD

## 2024-04-20 ENCOUNTER — Ambulatory Visit: Admitting: Psychology

## 2024-04-20 DIAGNOSIS — F411 Generalized anxiety disorder: Secondary | ICD-10-CM

## 2024-04-20 DIAGNOSIS — F331 Major depressive disorder, recurrent, moderate: Secondary | ICD-10-CM | POA: Diagnosis not present

## 2024-04-20 NOTE — Progress Notes (Signed)
 Sharon Cole is a 68 y.o. female patient   04/20/2024  Treatment Plan: Diagnosis 296.32 (Major depressive affective disorder, recurrent episode, moderate) [n/a]  300.02 (Generalized anxiety disorder) [n/a]  Symptoms Depressed or irritable mood. (Status: maintained) -- No Description Entered  Feelings of hopelessness, worthlessness, or inappropriate guilt. (Status: maintained) -- No Description Entered  Lack of energy. (Status: maintained) -- No Description Entered  Low self-esteem. (Status: maintained) -- No Description Entered  Medication Status compliance  Safety none  If Suicidal or Homicidal State Action Taken: unspecified  Current Risk: low Medications Abilify  (Dosage: .25mg )  Buspar  (Dosage: 30mg )  Citalopram (Dosage: 20mg )  Topomax (Dosage: unknown)  Objectives Related Problem: Recognize, accept, and cope with feelings of depression. Description: Identify and replace thoughts and beliefs that support depression. Target Date: 2025-04-13 Frequency: Daily Modality: individual Progress: 80%  Related Problem: Recognize, accept, and cope with feelings of depression. Description: Learn and implement behavioral strategies to overcome depression. Target Date: 2025-04-13 Frequency: Daily Modality: individual Progress: 85%  Related Problem: Recognize, accept, and cope with feelings of depression. Description: Verbalize an understanding and resolution of current interpersonal problems. Target Date: 2025-04-13 Frequency: Daily Modality: individual Progress: 90%  Related Problem: Recognize, accept, and cope with feelings of depression. Description: Verbalize insight into how past relationships may be influencing current experiences with depression. Target Date: 2025-04-13 Frequency: Daily Modality: individual Progress:  90%  Client Response full compliance  Service Location Location, [address] [place], [place] [address]  Service Code cpt [phone]  Normalize/Reframe  Facilitate problem solving  Identify/label emotions  Validate/empathize  Emotion regulation skills  Self care activities  Lifestyle change (exercise, nutrition)  Self-monitoring  Identified an insight  Rationally challenge thoughts or beliefs/cognitive restructuring  Session notes:   Goals/Plan: Wants to work on being genuine and being satisfied with herself and develop stronger self-esteem. Also, would like to improve her primary relationship and have her interpersonal and romantic needs met. This goal is now met, as she had ended the relationship. Wants to continue self-care and maintain weight loss. Needs to develop strategy to manage relationship with her son [name], who struggles with mental health issues. Goal date 12-25. [name] is now wanting to create a new, fulfilling life and pursue her interest in art. Will also attempt to create a small, but satisfying social network with like-minded people. Goal date is 12-26.   Meds:Lexapro  15mg , Buspar , Abilify  5mg  , Remeron  Patient agrees to video Caregility session and is aware of the limitations of this platform. She is at home and I am at my home office.   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 She has some concerns about how much training she will get and will learn during her time there. She is currently taking care of her grandchildren while the parents are out of town. She is doing much better at losing weight, taking a slower and healthier approach. We talked about her redefining her relationship to food.  Sharon [name] [name], PhD  Time:  4:15p-5:00p 45 minutes.

## 2024-04-27 ENCOUNTER — Ambulatory Visit: Admitting: Psychology

## 2024-05-04 ENCOUNTER — Ambulatory Visit: Admitting: Psychology

## 2024-05-11 ENCOUNTER — Ambulatory Visit: Admitting: Psychology

## 2024-05-11 DIAGNOSIS — F411 Generalized anxiety disorder: Secondary | ICD-10-CM

## 2024-05-11 NOTE — Progress Notes (Signed)
 "                                                                                            Sharon Cole is a 69 y.o. female patient   05/11/2024  Treatment Plan: Diagnosis 296.32 (Major depressive affective disorder, recurrent episode, moderate) [n/a]  300.02 (Generalized anxiety disorder) [n/a]  Symptoms Depressed or irritable mood. (Status: maintained) -- No Description Entered  Feelings of hopelessness, worthlessness, or inappropriate guilt. (Status: maintained) -- No Description Entered  Lack of energy. (Status: maintained) -- No Description Entered  Low self-esteem. (Status: maintained) -- No Description Entered  Medication Status compliance  Safety none  If Suicidal or Homicidal State Action Taken: unspecified  Current Risk: low Medications Abilify  (Dosage: .25mg )  Buspar  (Dosage: 30mg )  Citalopram (Dosage: 20mg )  Topomax (Dosage: unknown)  Objectives Related Problem: Recognize, accept, and cope with feelings of depression. Description: Identify and replace thoughts and beliefs that support depression. Target Date: 2025-04-13 Frequency: Daily Modality: individual Progress: 80%  Related Problem: Recognize, accept, and cope with feelings of depression. Description: Learn and implement behavioral strategies to overcome depression. Target Date: 2025-04-13 Frequency: Daily Modality: individual Progress: 85%  Related Problem: Recognize, accept, and cope with feelings of depression. Description: Verbalize an understanding and resolution of current interpersonal problems. Target Date: 2025-04-13 Frequency: Daily Modality: individual Progress: 90%  Related Problem: Recognize, accept, and cope with feelings of depression. Description: Verbalize insight into how past relationships may be influencing current experiences with depression. Target Date: 2025-04-13 Frequency:  Daily Modality: individual Progress: 90%  Client Response full compliance  Service Location Location, [address] [place], [place] [address]  Service Code cpt [phone]  Normalize/Reframe  Facilitate problem solving  Identify/label emotions  Validate/empathize  Emotion regulation skills  Self care activities  Lifestyle change (exercise, nutrition)  Self-monitoring  Identified an insight  Rationally challenge thoughts or beliefs/cognitive restructuring  Session notes:   Goals/Plan: Wants to work on being genuine and being satisfied with herself and develop stronger self-esteem. Also, would like to improve her primary relationship and have her interpersonal and romantic needs met. This goal is now met, as she had ended the relationship. Wants to continue self-care and maintain weight loss. Needs to develop strategy to manage relationship with her son Sharon, who struggles with mental health issues. Goal date 12-25. 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 Will also attempt to create a small, but satisfying social network with like-minded people. Goal date is 12-26.   Meds:Lexapro  15mg , Buspar , Abilify  5mg  , Remeron  Patient agrees to video Caregility session and is aware of the limitations of this platform. She is at home and I am at my home office.   Sharon Cole says that she did not celebrate New Years because her job site was open til 9. 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 She is juggling a lot if variables that keep her very busy. She is more organized than usual and has a good plan moving forward.  CONI Sharon [name], PhD  Time: 1:15p-2:00p 45  minutes.                                              "

## 2024-05-18 ENCOUNTER — Ambulatory Visit: Admitting: Psychology

## 2024-05-18 DIAGNOSIS — F411 Generalized anxiety disorder: Secondary | ICD-10-CM | POA: Diagnosis not present

## 2024-05-18 NOTE — Progress Notes (Signed)
 "                    Sharon Cole is a 69 y.o. female patient   05/18/2024  Treatment Plan: Diagnosis 296.32 (Major depressive affective disorder, recurrent episode, moderate) [n/a]  300.02 (Generalized anxiety disorder) [n/a]  Symptoms Depressed or irritable mood. (Status: maintained) -- No Description Entered  Feelings of hopelessness, worthlessness, or inappropriate guilt. (Status: maintained) -- No Description Entered  Lack of energy. (Status: maintained) -- No Description Entered  Low self-esteem. (Status: maintained) -- No Description Entered  Medication Status compliance  Safety none  If Suicidal or Homicidal State Action Taken: unspecified  Current Risk: low Medications Abilify  (Dosage: .25mg )  Buspar  (Dosage: 30mg )  Citalopram (Dosage: 20mg )  Topomax (Dosage: unknown)  Objectives Related Problem: Recognize, accept, and cope with feelings of depression. Description: Identify and replace thoughts and beliefs that support depression. Target Date: 2025-04-13 Frequency: Daily Modality: individual Progress: 80%  Related Problem: Recognize, accept, and cope with feelings of depression. Description: Learn and implement behavioral strategies to overcome depression. Target Date: 2025-04-13 Frequency: Daily Modality: individual Progress: 85%  Related Problem: Recognize, accept, and cope with feelings of depression. Description: Verbalize an understanding and resolution of current interpersonal problems. Target Date: 2025-04-13 Frequency: Daily Modality: individual Progress: 90%  Related Problem: Recognize, accept, and cope with feelings of depression. Description: Verbalize insight into how past relationships may be influencing current experiences with depression. Target Date: 2025-04-13 Frequency: Daily Modality: individual Progress: 90%  Client Response full compliance  Service Location Location, [address] [place], [place] [address]   Service Code cpt [phone]  Normalize/Reframe  Facilitate problem solving  Identify/label emotions  Validate/empathize  Emotion regulation skills  Self care activities  Lifestyle change (exercise, nutrition)  Self-monitoring  Identified an insight  Rationally challenge thoughts or beliefs/cognitive restructuring  Session notes:   Goals/Plan: Wants to work on being genuine and being satisfied with herself and develop stronger self-esteem. Also, would like to improve her primary relationship and have her interpersonal and romantic needs met. This goal is now met, as she had ended the relationship. Wants to continue self-care and maintain weight loss. Needs to develop strategy to manage relationship with her son Sharon Cole, who struggles with mental health issues. Goal date 12-25. 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 Will also attempt to create a small, but satisfying social network with like-minded people. Goal date is 12-26.   Meds:Lexapro  15mg , Buspar , Abilify  5mg  , Remeron  Patient agrees to video Caregility session and is aware of the limitations of this platform. She is at home and I am at my home office.   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 She will then transfer to another internship ato get more clinical experience. She met with the school professor and agency supervisor last week. 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 Discussed her need for improved self-care. Life is not at all balanced. She will try to start to integrate more self-care activities. He diet is not going well and she is exploring possibility of going on weight watchers and start a GLP 1 medication.  CONI Sharon Cole [name], PhD  Time: 4:15p-5:00p 45 minutes.                                              "

## 2024-05-25 ENCOUNTER — Ambulatory Visit: Admitting: Psychology

## 2024-05-25 DIAGNOSIS — F411 Generalized anxiety disorder: Secondary | ICD-10-CM

## 2024-05-25 NOTE — Progress Notes (Signed)
 "                                   Sharon Cole is a 69 y.o. female patient   05/25/2024  Treatment Plan: Diagnosis 296.32 (Major depressive affective disorder, recurrent episode, moderate) [n/a]  300.02 (Generalized anxiety disorder) [n/a]  Symptoms Depressed or irritable mood. (Status: maintained) -- No Description Entered  Feelings of hopelessness, worthlessness, or inappropriate guilt. (Status: maintained) -- No Description Entered  Lack of energy. (Status: maintained) -- No Description Entered  Low self-esteem. (Status: maintained) -- No Description Entered  Medication Status compliance  Safety none  If Suicidal or Homicidal State Action Taken: unspecified  Current Risk: low Medications Abilify  (Dosage: .25mg )  Buspar  (Dosage: 30mg )  Citalopram (Dosage: 20mg )  Topomax (Dosage: unknown)  Objectives Related Problem: Recognize, accept, and cope with feelings of depression. Description: Identify and replace thoughts and beliefs that support depression. Target Date: 2025-04-13 Frequency: Daily Modality: individual Progress: 80%  Related Problem: Recognize, accept, and cope with feelings of depression. Description: Learn and implement behavioral strategies to overcome depression. Target Date: 2025-04-13 Frequency: Daily Modality: individual Progress: 85%  Related Problem: Recognize, accept, and cope with feelings of depression. Description: Verbalize an understanding and resolution of current interpersonal problems. Target Date: 2025-04-13 Frequency: Daily Modality: individual Progress: 90%  Related Problem: Recognize, accept, and cope with feelings of depression. Description: Verbalize insight into how past relationships may be influencing current experiences with depression. Target Date: 2025-04-13 Frequency: Daily Modality: individual Progress: 90%  Client Response full compliance  Service Location Location, [address] [place], [place] [address]  Service Code cpt [phone]  Normalize/Reframe  Facilitate problem solving  Identify/label emotions  Validate/empathize  Emotion regulation skills  Self care activities  Lifestyle change (exercise, nutrition)  Self-monitoring  Identified an insight  Rationally challenge thoughts or beliefs/cognitive restructuring  Session notes:   Goals/Plan: Wants to work on being genuine and being satisfied with herself and develop stronger self-esteem. Also, would like to improve her primary relationship and have her interpersonal and romantic needs met. This goal is now met, as she had ended the relationship. Wants to continue self-care and maintain weight loss. Needs to develop strategy to manage relationship with her son Sharon Cole, who struggles with mental health issues. Goal date 12-25. 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 Will also attempt to create a small, but satisfying social network with like-minded people. Goal date is 12-26.   Meds:Lexapro  15mg , Buspar , Abilify  5mg  , Remeron  Patient agrees to video Caregility session and is aware of the limitations of this platform. She is at home and I am at my home office.   Sharon Cole reports that she had a good week and pushed hard to get her work done. 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 Her professor told her to let her know if there was a problem of getting in all of her required hours. Says she hit the wall on Friday. Says she did nothing in her down time.  CONI Sharon Cole [name], PhD  Time: 1:15p-2:00p 45 minutes.                                              "

## 2024-05-31 ENCOUNTER — Telehealth (HOSPITAL_COMMUNITY): Admitting: Student in an Organized Health Care Education/Training Program

## 2024-05-31 ENCOUNTER — Encounter (HOSPITAL_COMMUNITY): Payer: Self-pay | Admitting: Student in an Organized Health Care Education/Training Program

## 2024-05-31 DIAGNOSIS — F3342 Major depressive disorder, recurrent, in full remission: Secondary | ICD-10-CM | POA: Diagnosis not present

## 2024-05-31 DIAGNOSIS — F411 Generalized anxiety disorder: Secondary | ICD-10-CM

## 2024-05-31 MED ORDER — BUSPIRONE HCL 30 MG PO TABS: 30.0000 mg | ORAL_TABLET | Freq: Two times a day (BID) | ORAL | 0 refills | Status: AC

## 2024-05-31 MED ORDER — GABAPENTIN 100 MG PO CAPS: 100.0000 mg | ORAL_CAPSULE | Freq: Every day | ORAL | 0 refills | Status: AC | PRN

## 2024-05-31 MED ORDER — ARIPIPRAZOLE 5 MG PO TABS: 5.0000 mg | ORAL_TABLET | Freq: Every day | ORAL | 0 refills | Status: AC

## 2024-05-31 MED ORDER — ESCITALOPRAM OXALATE 5 MG PO TABS: 15.0000 mg | ORAL_TABLET | Freq: Every day | ORAL | 0 refills | Status: AC

## 2024-05-31 NOTE — Progress Notes (Signed)
 [facility] MD Outpatient Progress Note  05/31/2024 9:18 AM Sharon Cole  MRN:  [id number]  Virtual Visit via Video Note  I connected with Sharon Cole on 05/31/24 at  9:00 AM EST by a video enabled telemedicine application and verified that I am speaking with the correct person using two identifiers.  Location: Patient: Home Provider: Office   I discussed the limitations, risks, security and privacy concerns of performing an evaluation and management service by telephone and the availability of in person appointments. I also discussed with the patient that there may be a patient responsible charge related to this service. The patient expressed understanding and agreed to proceed.   I discussed the assessment and treatment plan with the patient. The patient was provided an opportunity to ask questions and all were answered. The patient agreed with the plan and demonstrated an understanding of the instructions.   The patient was advised to call back or seek an in-person evaluation if the symptoms worsen or if the condition fails to improve as anticipated.   Sharon Masson, MD Psych Resident, PGY-3    Assessment:  Sharon Cole presents for follow-up evaluation on 05/31/24 .  Since the last visit, the patient remains stable with no acute concerns. Mood and anxiety are overall unchanged; she reports mild situational anxiety and is managing it appropriately without functional impairment. No safety concerns.  Current medications are appropriate to continue at current doses; no changes indicated given stability. Plan to continue current treatment and follow up in person at the next visit.   Identifying Information: Sharon Cole is a 69 y.o. female with a history of GAD, cannabis use disorder, MDD who is an established patient with [facility] for management of anxiety and depression. For a comprehensive history and detailed assessment, please refer to  the initial adult assessment. She is a former patient of Dr. Marry.  The patient's PMHx is significant for obesity, HLD, and prediabetes.   Plan:  # Generalized anxiety disorder  Hx major depressive disorder, moderate Interventions: -- Continue Lexapro  15 mg daily - Continue Abilify  5 mg nightly for augmentation of antidepressant medications - Continue buspirone  30 mg BID -- Continue gabapentin  100 mg daily PRN - Discontinue propranolol   - Continue therapy with Sharon Cole   # Cannabis use disorder Interventions: -- Active -- Cessation/abstinence encouraged   #Long term use of antipsychotic medication -- Repeat lipid panel, A1c, and EKG due April 2026   Patient was given contact information for behavioral health clinic and was instructed to call 911 for emergencies.   Subjective:  Chief Complaint:  Chief Complaint  Patient presents with   Follow-up    Interval History:  The patient reports that since the last visit  she has been doing well overall. She reports no depressive symptoms at this time. 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 She reports the workload has been stressful and challenging and states the internship is expected to continue through the first week of May. She describes having started the quarter with a two week deficit and reports she has been actively catching up. She reports some situational anxiety related to remaining in [place] when her son traveled to [place]  for the winter storm with his child and wife and wanted her to accompany them.  She reports continuing psychotherapy with Sharon Cole and states she has found therapy  helpful. She reports medication adherence as prescribed and denies missing doses. She reports no adverse effects from her medications.  She reports good sleep with no concerns. She  reports good appetite with no concerns. She reports no concerns related to energy. She reports no caffeine related issues.  She reports denying alcohol and tobacco use. She states cannabis use is unchanged from the prior visit and reports using a vape pen that typically lasts about two weeks. She reports no other substance use.  She reports denying suicidal ideation. She reports denying homicidal ideation. She reports denying auditory or visual hallucinations.    Visit Diagnosis:    ICD-10-CM   1. GAD (generalized anxiety disorder)  F41.1 escitalopram  (LEXAPRO ) 5 MG tablet    busPIRone  (BUSPAR ) 30 MG tablet    ARIPiprazole  (ABILIFY ) 5 MG tablet    gabapentin  (NEURONTIN ) 100 MG capsule    2. 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   Occupation: consulting civil engineer  Tobacco Use   Smoking status: Never   Smokeless tobacco: Never  Vaping Use   Vaping status: Never Used  Substance and Sexual Activity   Alcohol use: Yes    Alcohol/week: 1.0 standard drink of alcohol    Types: 1 Standard drinks or equivalent per week   Drug use: No   Sexual activity: Not Currently    Partners: Male  Other Topics Concern   Not on file  Social History Narrative   Lives alone/2025   Caffeine use:    Drinks 16oz caffeine drinks a day    Social Drivers of Health   Tobacco Use: Low Risk (03/19/2024)   Patient History     Smoking Tobacco Use: Never    Smokeless Tobacco Use: Never    Passive Exposure: Not on file  Financial Resource Strain: Low Risk (02/17/2024)   Overall Financial Resource Strain (CARDIA)    Difficulty of Paying Living Expenses: Not very hard  Food Insecurity: No Food Insecurity (02/17/2024)   Epic    Worried About Programme Researcher, Broadcasting/film/video in the Last Year: Never true    Ran Out of Food in the Last Year: Never true  Transportation Needs: No Transportation Needs (02/17/2024)   Epic    Lack of Transportation (Medical): No    Lack of Transportation (Non-Medical): No  Physical Activity: Inactive (02/17/2024)   Exercise Vital Sign    Days of Exercise per Week: 0 days    Minutes of Exercise per Session: Not on file  Stress: No Stress Concern Present (02/17/2024)   [employer] of Occupational Health - Occupational Stress Questionnaire    Feeling of Stress: Not at all  Social Connections: Socially Isolated (02/17/2024)   Social Connection and Isolation Panel    Frequency of Communication with Friends and Family: Twice a week    Frequency of Social Gatherings with Friends and Family: Once a week    Attends  Religious Services: Never    Active Member of Clubs or Organizations: No    Attends Banker Meetings: Not on file    Marital Status: Divorced  Depression (PHQ2-9): Low Risk (03/15/2024)   Depression (PHQ2-9)    PHQ-2 Score: 0  Alcohol Screen: Low Risk (02/17/2024)   Alcohol Screen    Last Alcohol Screening Score (AUDIT): 1  Housing: Low Risk (02/17/2024)   Epic    Unable to Pay for Housing in the Last Year: No    Number of Times Moved in the Last Year: 0    Homeless in the Last Year: No  Utilities: Not At Risk (11/21/2023)   Epic    Threatened with loss of utilities: No  Health Literacy: Adequate Health Literacy (11/21/2023)   B1300 Health Literacy    Frequency of need for help with medical instructions: Never    Allergies: No Known Allergies  Current  Medications: Current Outpatient Medications  Medication Sig Dispense Refill   Apoaequorin (PREVAGEN EXTRA STRENGTH PO) Take by mouth.     [START ON 06/09/2024] ARIPiprazole  (ABILIFY ) 5 MG tablet Take 1 tablet (5 mg total) by mouth daily. 90 tablet 0   atorvastatin  (LIPITOR) 20 MG tablet Take 1 tablet (20 mg total) by mouth daily. 90 tablet 3   [START ON 06/09/2024] busPIRone  (BUSPAR ) 30 MG tablet Take 1 tablet (30 mg total) by mouth 2 (two) times daily. 180 tablet 0   [START ON 06/09/2024] escitalopram  (LEXAPRO ) 5 MG tablet Take 3 tablets (15 mg total) by mouth daily. 270 tablet 0   [START ON 06/09/2024] gabapentin  (NEURONTIN ) 100 MG capsule Take 1 capsule (100 mg total) by mouth daily as needed. 90 capsule 0   Multiple Vitamins-Minerals (MULTIVITAMIN PO) Take 1 tablet by mouth daily.     Omeprazole  Magnesium (PRILOSEC PO)      valACYclovir  (VALTREX ) 500 MG tablet Take 500 mg by mouth 2 (two) times daily.     No current facility-administered medications for this visit.    ROS: Review of Systems  All other systems reviewed and are negative.   Objective:  Objective: Psychiatric Specialty Exam: General Appearance: Casual, fairly groomed  Eye Contact:  Good    Speech:  Clear, coherent, normal rate, spontaneous  Volume:  Normal   Mood:  see above  Affect:  congruent, full range  Thought Content: Logical   Suicidal Thoughts: see subjective  Thought Process:  Coherent, goal-directed  Orientation:  A&Ox4   Memory:  Immediate good  Judgment:  Good  Insight:  Fair  Concentration:  Not formally assessed  Recall:  Not formally assessed  Fund of Knowledge: Not formally assessed  Language: Good, fluent  Psychomotor Activity: Grossly normal  Akathisia:  NA   AIMS (if indicated): NA   Assets:   Communication Skills Desire for Improvement Resilience  ADL's:  Intact  Cognition: WNL  Sleep: see above  Appetite: see above    Physical Exam Constitutional:      General: She is not in acute  distress.    Appearance: She is not ill-appearing.  HENT:     Head: Normocephalic and atraumatic.  Eyes:     Extraocular Movements: Extraocular movements intact.     Conjunctiva/sclera: Conjunctivae normal.  Pulmonary:     Effort: Pulmonary effort is normal. No respiratory distress.  Neurological:     General: No focal deficit present.     Mental Status: She is alert and oriented to person, place, and time.  Metabolic Disorder Labs: Lab Results  Component Value Date   HGBA1C 5.7 08/19/2023   No results found for: PROLACTIN Lab Results  Component Value Date   CHOL 184 08/19/2023   TRIG 118.0 08/19/2023   HDL 55.80 08/19/2023   CHOLHDL 3 08/19/2023   VLDL 23.6 08/19/2023   LDLCALC 105 (H) 08/19/2023   LDLCALC 125 (H) 03/25/2023   Lab Results  Component Value Date   TSH 2.30 08/19/2023   TSH 1.760 02/05/2022    Therapeutic Level Labs: No results found for: LITHIUM No results found for: VALPROATE No results found for: CBMZ  Screenings:  GAD-7    Flowsheet Row [facility] in [facility] Office Visit from 11/24/2023 in [facility]  Total GAD-7 Score 0 6   PHQ2-9    Flowsheet Row Office Visit from 03/15/2024 in [facility] Office Visit from 12/15/2023 in [facility] Visit from 11/24/2023 in [facility] Clinical Support from 11/21/2023 in [facility] Office Visit from 10/06/2023 in [facility]  PHQ-2 Total Score 0 0 0 3 0  PHQ-9 Total Score -- 0 1 3 --    Collaboration of Care:   Patient/Guardian was advised Release of Information must be obtained prior to any record release in order to collaborate their care with an outside provider. Patient/Guardian was advised if they have  not already done so to contact the registration department to sign all necessary forms in order for us  to release information regarding their care.   Consent: Patient/Guardian gives verbal consent for treatment and assignment of benefits for services provided during this visit. Patient/Guardian expressed understanding and agreed to proceed.    Sharon Masson, MD 05/31/2024, 9:18 AM

## 2024-06-01 ENCOUNTER — Ambulatory Visit: Admitting: Psychology

## 2024-06-01 DIAGNOSIS — F411 Generalized anxiety disorder: Secondary | ICD-10-CM | POA: Diagnosis not present

## 2024-06-01 NOTE — Progress Notes (Signed)
 "                                                  Sharon Cole is a 69 y.o. female patient   06/01/2024  Treatment Plan: Diagnosis 296.32 (Major depressive affective disorder, recurrent episode, moderate) [n/a]  300.02 (Generalized anxiety disorder) [n/a]  Symptoms Depressed or irritable mood. (Status: maintained) -- No Description Entered  Feelings of hopelessness, worthlessness, or inappropriate guilt. (Status: maintained) -- No Description Entered  Lack of energy. (Status: maintained) -- No Description Entered  Low self-esteem. (Status: maintained) -- No Description Entered  Medication Status compliance  Safety none  If Suicidal or Homicidal State Action Taken: unspecified  Current Risk: low Medications Abilify  (Dosage: .25mg )  Buspar  (Dosage: 30mg )  Citalopram (Dosage: 20mg )  Topomax (Dosage: unknown)  Objectives Related Problem: Recognize, accept, and cope with feelings of depression. Description: Identify and replace thoughts and beliefs that support depression. Target Date: 2025-04-13 Frequency: Daily Modality: individual Progress: 80%  Related Problem: Recognize, accept, and cope with feelings of depression. Description: Learn and implement behavioral strategies to overcome depression. Target Date: 2025-04-13 Frequency: Daily Modality: individual Progress: 85%  Related Problem: Recognize, accept, and cope with feelings of depression. Description: Verbalize an understanding and resolution of current interpersonal problems. Target Date: 2025-04-13 Frequency: Daily Modality: individual Progress: 90%  Related Problem: Recognize, accept, and cope with feelings of depression. Description: Verbalize insight into how past relationships may be influencing current experiences with depression. Target Date: 2025-04-13 Frequency: Daily Modality: individual Progress: 90%  Client Response full compliance  Service  Location Location, [address] [place], [place] [address]  Service Code cpt [phone]  Normalize/Reframe  Facilitate problem solving  Identify/label emotions  Validate/empathize  Emotion regulation skills  Self care activities  Lifestyle change (exercise, nutrition)  Self-monitoring  Identified an insight  Rationally challenge thoughts or beliefs/cognitive restructuring  Session notes:   Goals/Plan: Wants to work on being genuine and being satisfied with herself and develop stronger self-esteem. Also, would like to improve her primary relationship and have her interpersonal and romantic needs met. This goal is now met, as she had ended the relationship. Wants to continue self-care and maintain weight loss. Needs to develop strategy to manage relationship with her son Sharon Cole, who struggles with mental health issues. Goal date 12-25. 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 Will also attempt to create a small, but satisfying social network with like-minded people. Goal date is 12-26.   Meds:Lexapro  15mg , Buspar , Abilify  5mg  , Remeron  Patient agrees to video Caregility session and is aware of the limitations of this platform. She is at home and I am at my home office.   Sharon Cole decided not to go to [place]  because it would have been irresponsible. She was distressed and took it out on herself by over-eating. She is trying not to be too harsh on herself, but hopes to get back to some sort of weight loss regimen. 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 We discussed how she will use her break time for self-care. She feels her moods are relatively stable and she is managing her school and work obligations.  CONI Sharon Cole [name], PhD  Time: 4:15p-5:00p 45 minutes.                                              "

## 2024-06-08 ENCOUNTER — Ambulatory Visit: Admitting: Psychology

## 2024-06-15 ENCOUNTER — Ambulatory Visit: Admitting: Psychology

## 2024-06-22 ENCOUNTER — Ambulatory Visit: Admitting: Psychology

## 2024-06-23 ENCOUNTER — Ambulatory Visit: Admitting: Family Medicine

## 2024-06-29 ENCOUNTER — Ambulatory Visit: Admitting: Psychology

## 2024-07-06 ENCOUNTER — Ambulatory Visit: Admitting: Psychology

## 2024-07-13 ENCOUNTER — Ambulatory Visit: Admitting: Psychology

## 2024-07-20 ENCOUNTER — Ambulatory Visit: Admitting: Psychology

## 2024-07-26 ENCOUNTER — Ambulatory Visit (HOSPITAL_COMMUNITY): Admitting: Student in an Organized Health Care Education/Training Program

## 2024-07-27 ENCOUNTER — Ambulatory Visit: Admitting: Psychology

## 2024-08-03 ENCOUNTER — Ambulatory Visit: Admitting: Psychology

## 2024-08-10 ENCOUNTER — Ambulatory Visit: Admitting: Psychology

## 2024-08-17 ENCOUNTER — Ambulatory Visit: Admitting: Psychology

## 2024-08-24 ENCOUNTER — Ambulatory Visit: Admitting: Psychology

## 2024-08-31 ENCOUNTER — Ambulatory Visit: Admitting: Psychology

## 2024-09-07 ENCOUNTER — Ambulatory Visit: Admitting: Psychology

## 2024-11-23 ENCOUNTER — Ambulatory Visit
